# Patient Record
Sex: Female | Born: 1960
Health system: Southern US, Community
[De-identification: ages and names within clinical notes are randomized; demographics above are authoritative.]

## PROBLEM LIST (undated history)

## (undated) DIAGNOSIS — M5020 Other cervical disc displacement, unspecified cervical region: Secondary | ICD-10-CM

## (undated) DIAGNOSIS — M1990 Unspecified osteoarthritis, unspecified site: Secondary | ICD-10-CM

## (undated) DIAGNOSIS — I10 Essential (primary) hypertension: Secondary | ICD-10-CM

## (undated) DIAGNOSIS — F32A Depression, unspecified: Secondary | ICD-10-CM

## (undated) DIAGNOSIS — J45909 Unspecified asthma, uncomplicated: Secondary | ICD-10-CM

## (undated) DIAGNOSIS — F329 Major depressive disorder, single episode, unspecified: Secondary | ICD-10-CM

## (undated) DIAGNOSIS — G35D Multiple sclerosis, unspecified: Secondary | ICD-10-CM

## (undated) DIAGNOSIS — G43909 Migraine, unspecified, not intractable, without status migrainosus: Secondary | ICD-10-CM

## (undated) DIAGNOSIS — H9190 Unspecified hearing loss, unspecified ear: Secondary | ICD-10-CM

## (undated) DIAGNOSIS — C801 Malignant (primary) neoplasm, unspecified: Secondary | ICD-10-CM

## (undated) DIAGNOSIS — O039 Complete or unspecified spontaneous abortion without complication: Secondary | ICD-10-CM

## (undated) DIAGNOSIS — G35 Multiple sclerosis: Secondary | ICD-10-CM

## (undated) DIAGNOSIS — J342 Deviated nasal septum: Secondary | ICD-10-CM

## (undated) HISTORY — PX: OTHER SURGICAL HISTORY: SHX169

## (undated) HISTORY — DX: Other cervical disc displacement, unspecified cervical region: M50.20

## (undated) HISTORY — DX: Depression, unspecified: F32.A

## (undated) HISTORY — DX: Major depressive disorder, single episode, unspecified: F32.9

## (undated) HISTORY — DX: Multiple sclerosis: G35

## (undated) HISTORY — PX: NASAL SEPTUM SURGERY: SHX37

## (undated) HISTORY — DX: Complete or unspecified spontaneous abortion without complication: O03.9

## (undated) HISTORY — DX: Unspecified hearing loss, unspecified ear: H91.90

## (undated) HISTORY — DX: Essential (primary) hypertension: I10

## (undated) HISTORY — DX: Migraine, unspecified, not intractable, without status migrainosus: G43.909

## (undated) HISTORY — DX: Multiple sclerosis, unspecified: G35.D

## (undated) HISTORY — DX: Deviated nasal septum: J34.2

---

## 2014-06-11 DIAGNOSIS — M5020 Other cervical disc displacement, unspecified cervical region: Secondary | ICD-10-CM | POA: Insufficient documentation

## 2014-06-11 DIAGNOSIS — H9193 Unspecified hearing loss, bilateral: Secondary | ICD-10-CM | POA: Insufficient documentation

## 2014-06-11 DIAGNOSIS — H9190 Unspecified hearing loss, unspecified ear: Secondary | ICD-10-CM

## 2014-06-11 DIAGNOSIS — N951 Menopausal and female climacteric states: Secondary | ICD-10-CM | POA: Insufficient documentation

## 2014-06-11 DIAGNOSIS — F339 Major depressive disorder, recurrent, unspecified: Secondary | ICD-10-CM | POA: Insufficient documentation

## 2014-06-11 DIAGNOSIS — M5126 Other intervertebral disc displacement, lumbar region: Secondary | ICD-10-CM | POA: Insufficient documentation

## 2014-06-11 DIAGNOSIS — F17210 Nicotine dependence, cigarettes, uncomplicated: Secondary | ICD-10-CM | POA: Insufficient documentation

## 2014-06-11 HISTORY — DX: Unspecified hearing loss, unspecified ear: H91.90

## 2014-06-19 ENCOUNTER — Encounter: Payer: Self-pay | Admitting: *Deleted

## 2014-06-20 ENCOUNTER — Encounter: Payer: Self-pay | Admitting: Neurology

## 2014-06-20 ENCOUNTER — Ambulatory Visit (INDEPENDENT_AMBULATORY_CARE_PROVIDER_SITE_OTHER): Payer: Medicare HMO | Admitting: Neurology

## 2014-06-20 VITALS — BP 149/95 | HR 103 | Ht 62.0 in | Wt 131.0 lb

## 2014-06-20 DIAGNOSIS — R5381 Other malaise: Secondary | ICD-10-CM

## 2014-06-20 DIAGNOSIS — G35 Multiple sclerosis: Secondary | ICD-10-CM

## 2014-06-20 DIAGNOSIS — R200 Anesthesia of skin: Secondary | ICD-10-CM

## 2014-06-20 DIAGNOSIS — R5383 Other fatigue: Secondary | ICD-10-CM

## 2014-06-20 DIAGNOSIS — G43909 Migraine, unspecified, not intractable, without status migrainosus: Secondary | ICD-10-CM

## 2014-06-20 DIAGNOSIS — G2581 Restless legs syndrome: Secondary | ICD-10-CM

## 2014-06-20 DIAGNOSIS — R209 Unspecified disturbances of skin sensation: Secondary | ICD-10-CM

## 2014-06-20 DIAGNOSIS — R202 Paresthesia of skin: Secondary | ICD-10-CM

## 2014-06-20 NOTE — Patient Instructions (Signed)
Overall you are doing fairly well but I do want to suggest a few things today:   Remember to drink plenty of fluid, eat healthy meals and do not skip any meals. Try to eat protein with a every meal and eat a healthy snack such as fruit or nuts in between meals. Try to keep a regular sleep-wake schedule and try to exercise daily, particularly in the form of walking, 20-30 minutes a day, if you can.   As far as diagnostic testing: EMG/NCS for ulnar neuropathy, MRI of the brain and cervical spine. Bloodwork.  I would like to see you back in 3 months, sooner if we need to. Please call us with any interim questions, concerns, problems, updates or refill requests.   Please also call us for any test results so we can go over those with you on the phone.  My clinical assistant and will answer any of your questions and relay your messages to me and also relay most of my messages to you.   Our phone number is [phone]. We also have an after hours call service for urgent matters and there is a physician on-call for urgent questions. For any emergencies you know to call 911 or go to the nearest emergency room

## 2014-06-20 NOTE — Progress Notes (Addendum)
[facility]    Provider:  Dr Jaynee Eagles Referring Provider: Leamon Arnt, MD Primary Care Physician:  Leamon Arnt, MD  CC:  Multiple Sclerosis  HPI:  Debbie Watts is a 53 y.o. female here as a referral from Dr. Jonni Sanger for Multiple Sclerosis  53 year old female who was diagnosed with MS in 1999. She has no records. She had MRI and lumbar puncture. Was diagnosed with Dr. Nadara Mustard zwibel in [place] and is retired. Then saw Dr. Fatima Sanger and haven't seen him for at least 5 years in [place]. Has had issues the past 2.5 years but has not seen a neurologist. Was on Avonex but stopped due to insurance. Symptoms on the avonex didn't worsen or improve, remained stable.  Is not on any medications now except baclofen for spasticity. The last 3 years she has been ok. Every so often she gets "little glitches here and there". She is having some problems with coldness of the hands. Original symptoms were weakness of the right arm. Does have several herniated disks in her neck and back. No right arm weakness in the last 3 years. Her fingers feel tight and swollen occ just on the right hand. She is experiencing RLS. Her left leg starts bothering her and she has to keep moving it mostly at night but during the day too. Right leg too but not as much. Takes Sinemet for RLS. Trips over her own feet sometimes. She fell once 2.5 months ago and skinned her knee. 2 occassions of hot flashes after having a beer and she passed out but came right back to consciousness  in 2011. She went to the hospital but doesn't remember the diagnosis although she was evaluated for seizures and was negative. Swallowing ok. No UTIs and urinating well. No urinary frequency or retention. Neck is sore. The 2 fingers on the right are numb, digits 4-5. Thinks it is an ulnar neuropathy. If she sits for long periods of time her ankles will swell. Also has a primary care physician who she is following with. No vision loss. Reading is  harder but no problems. Elbow hurts, paresthesias when leaning on elbow.   Dr. Wandra Feinstein in [place]. Will order previous medical records.   Reviewed notes, labs and imaging from outside physicians, which showed: Has copd and current smoker, is on baclofen for spasticity, sinemet for RLS, flexeril for muscle spasms, paxil for depression, tramadol for pain, takes medication for migraine, has a cervical herniated disk  Review of Systems: Patient complains of symptoms per HPI as well as the following symptoms fatigue, easy bruising, hearing loss and ringing, joint pain, joint swelling, cramps, aching muscles, headache, numbness, weakness, passing out, restless legs, depression, decreased energy. Pertinent negatives per HPI. Otherwise out of a complete 14 system review, and all other reviewed systems are negative.   History   Social History  . Marital Status: Single    Spouse Name: N/A    Number of Children: 2  . Years of Education: 12   Occupational History  . Not on file.   Social History Main Topics  . Smoking status: Current Every Day Smoker  . Smokeless tobacco: Never Used  . Alcohol Use: Yes     Comment: 2-3 beers daily  . Drug Use: No  . Sexual Activity: Not on file   Other Topics Concern  . Not on file   Social History Narrative   Patient is single with 2 children.   Patient is right handed.  Patient has high school education.   Patient drinks 2 cups daily.    Family History  Problem Relation Age of Onset  . Adopted: Yes  . Family history unknown: Yes    Past Medical History  Diagnosis Date  . Deviated nasal septum   . Herniated cervical disc   . MS (multiple sclerosis)   . Depression   . Migraine   . Hearing loss 06/11/2014    Bilateral  . Miscarriage     2    Past Surgical History  Procedure Laterality Date  . Nasal septum surgery    . Spinal injections      Current Outpatient Prescriptions  Medication Sig Dispense Refill  .  Acetaminophen-Caffeine (TENSION HEADACHE RELIEF PO) Take by mouth.      . baclofen (LIORESAL) 10 MG tablet Take 10 mg by mouth daily.      . carbidopa-levodopa (SINEMET IR) 10-100 MG per tablet Take 1 tablet by mouth at bedtime.      . cyclobenzaprine (FLEXERIL) 10 MG tablet Take 10 mg by mouth at bedtime.      Marland Kitchen eletriptan (RELPAX) 40 MG tablet Take 40 mg by mouth as needed for migraine or headache. One tablet by mouth at onset of headache. May repeat in 2 hours if headache persists or recurs.      Marland Kitchen PARoxetine (PAXIL) 10 MG tablet Take 10 mg by mouth daily.      . traMADol (ULTRAM) 50 MG tablet Take 50 mg by mouth every 6 (six) hours as needed.       No current facility-administered medications for this visit.    Allergies as of 06/20/2014 - Review Complete 06/20/2014  Allergen Reaction Noted  . Novocain [procaine]  06/19/2014    Vitals: BP 149/95  Pulse 103  Ht 5\' 2"  (1.575 m)  Wt 131 lb (59.421 kg)  BMI 23.95 kg/m2 Last Weight:  Wt Readings from Last 1 Encounters:  06/20/14 131 lb (59.421 kg)   Last Height:   Ht Readings from Last 1 Encounters:  06/20/14 5\' 2"  (1.575 m)     Physical exam: Exam: Gen: NAD, conversant Eyes: anicteric sclerae, moist conjunctivae HENT: Atraumatic, oropharynx clear Neck: Trachea midline; supple,  Lungs: CTA, decreased breath sounds at bases                       CV: RRR, no MRG Abdomen: Soft, non-tender;  Extremities: No peripheral edema  Skin: Normal temperature, no rash,  Psych: Appropriate affect, pleasant  Neuro: Detailed Neurologic Exam  Speech:    Speech is normal; fluent and spontaneous with normal comprehension.  Cognition:    The patient is oriented to person, place, and time; memory intact; language fluent; normal attention, concentration, and fund of knowledge.  Cranial Nerves:    The pupils are equal, round, and reactive to light. Mild fundi pallor bilaterally, Visual fields are full to finger confrontation. Extraocular  movements are intact. Trigeminal sensation is intact and the muscles of mastication are normal. The face is symmetric. The palate elevates in the midline. Voice is normal. Shoulder shrug is normal. The tongue has normal motion without fasciculations.   Mild pallor fundi? Brisk upper and patellar relfexes   Coordination:    Normal finger to nose and heel to shin.  Gait:    Heel-toe and tandem gait are normal.   Motor Observation:    No asymmetry, no atrophy, and no involuntary movements noted.  Tone:    Normal muscle  tone.  Posture:    Posture is normal. normal erect    Strength:    Right hip flexion 3/5 and mild right grip weakness otherwise strength is V/V in the upper and lower limbs.   Vibratory Sensation:    Impaired right lower 5 seconds at the great toe, normal left great toe   Light Touch:    Normal light touch sensation in upper and lower extremities.  Proprioception:    Impaired right lower great toe  Pin Prick:    Normal sensation to pinprick in upper and lower extremities. Temperature:    Normal temperature sensation in upper and lower extremities. Reflex Exam:  DTR's:    Brisk DTRs throughout   Toes:    The toes are upgoing bilaterally.    Clonus:    Clonus is absent.  Assessment: 53 year old female who was diagnosed in 1999 with MS. She was on Avonex in the past but lost her insurance. Has not seen a neurologist in at least 5 years and has no previous records. Neuro exam demonstrates brisk reflexes, upgoing toes, normal tone, mildy pale fundi, mild right hip flexion weakness and right hand paresthesias and mild right hand grip.  Will request previous medical records Will image brain and neck with MS protocol EMG/NCS to eval for paresthesia in the hands, Will do a bloodwork screen for RLS including ferritin Continue triptan prn for migraines Continue sinemet for RLS counsed to stop smoking   Addendum 07/01/2014: Received approx 80 pages of documents  from patient from Dr. Verlin Fester, MD(primary care)  ranging from 2007 to 2015. Multiple sclerosis was mentioned in her notes several times but was not consistently listed in her PMHx and there was no supporting documentation on the diagnosis or any symptoms related to MS in the notes; no Brain, cervical or thoracic imaging included or referred to in the notes. No mention of patient seeing a neurologist for MS.    Diagnoses mentioned in the notes include: Arthritis, Fibromyalgia, COPD, bursitis, migraine headaches, neuropathy, multiple sclerosis, irregular menstrual bleeding, chronic sinusitis, allergic rhinitis, otitis externa, fibrocystic breast disease, uti, uri, cervical DJD, backache, ankle fx closed 3rd toe, lumbago,bursitis, tendonitis,costochondritis, fracture 4 ribs, gastroenteritis  Medications taken at some point over these years include: flexeril, tramadol, iron 325mg , sinemet, baclofen, paroxetine, flonase, astepro, topamax 25mg , lisinopril, cymbalta, fioricet, tomazepam, percocet,maxalt, zithromax, allegra, naproxen, vicodin, soma, imitrex, darvocet, napsylate, levoquin, diazepam, biaxin,    Sarina Ill, MD  [facility] [address] [place],  [address]  Phone [phone] Fax [phone]

## 2014-06-21 LAB — CBC WITH DIFFERENTIAL
Basophils Absolute: 0.1 10*3/uL (ref 0.0–0.2)
Basos: 1 %
EOS: 1 %
Eosinophils Absolute: 0.1 10*3/uL (ref 0.0–0.4)
HCT: 44.5 % (ref 34.0–46.6)
Hemoglobin: 14.9 g/dL (ref 11.1–15.9)
IMMATURE GRANS (ABS): 0 10*3/uL (ref 0.0–0.1)
Immature Granulocytes: 0 %
LYMPHS: 31 %
Lymphocytes Absolute: 3 10*3/uL (ref 0.7–3.1)
MCH: 29.6 pg (ref 26.6–33.0)
MCHC: 33.5 g/dL (ref 31.5–35.7)
MCV: 89 fL (ref 79–97)
MONOS ABS: 0.9 10*3/uL (ref 0.1–0.9)
Monocytes: 9 %
NEUTROS PCT: 58 %
Neutrophils Absolute: 5.6 10*3/uL (ref 1.4–7.0)
Platelets: 368 10*3/uL (ref 150–379)
RBC: 5.03 x10E6/uL (ref 3.77–5.28)
RDW: 14.4 % (ref 12.3–15.4)
WBC: 9.8 10*3/uL (ref 3.4–10.8)

## 2014-06-21 LAB — IRON AND TIBC
Iron Saturation: 16 % (ref 15–55)
Iron: 54 ug/dL (ref 35–155)
TIBC: 340 ug/dL (ref 250–450)
UIBC: 286 ug/dL (ref 150–375)

## 2014-06-21 LAB — COMPREHENSIVE METABOLIC PANEL
ALK PHOS: 94 IU/L (ref 39–117)
ALT: 16 IU/L (ref 0–32)
AST: 22 IU/L (ref 0–40)
Albumin/Globulin Ratio: 1.9 (ref 1.1–2.5)
Albumin: 4.5 g/dL (ref 3.5–5.5)
BUN / CREAT RATIO: 17 (ref 9–23)
BUN: 14 mg/dL (ref 6–24)
CALCIUM: 9.5 mg/dL (ref 8.7–10.2)
CO2: 27 mmol/L (ref 18–29)
CREATININE: 0.81 mg/dL (ref 0.57–1.00)
Chloride: 101 mmol/L (ref 97–108)
GFR calc Af Amer: 97 mL/min/{1.73_m2} (ref >59)
GFR calc non Af Amer: 84 mL/min/{1.73_m2} (ref >59)
GLOBULIN, TOTAL: 2.4 g/dL (ref 1.5–4.5)
Glucose: 74 mg/dL (ref 65–99)
POTASSIUM: 4.1 mmol/L (ref 3.5–5.2)
SODIUM: 144 mmol/L (ref 134–144)
Total Bilirubin: <0.2 mg/dL (ref 0.0–1.2)
Total Protein: 6.9 g/dL (ref 6.0–8.5)

## 2014-06-21 LAB — FERRITIN: Ferritin: 14 ng/mL — ABNORMAL LOW (ref 15–150)

## 2014-06-23 DIAGNOSIS — G2581 Restless legs syndrome: Secondary | ICD-10-CM | POA: Insufficient documentation

## 2014-06-23 DIAGNOSIS — G35 Multiple sclerosis: Secondary | ICD-10-CM | POA: Insufficient documentation

## 2014-06-23 DIAGNOSIS — R202 Paresthesia of skin: Secondary | ICD-10-CM | POA: Insufficient documentation

## 2014-06-23 DIAGNOSIS — G43909 Migraine, unspecified, not intractable, without status migrainosus: Secondary | ICD-10-CM | POA: Insufficient documentation

## 2014-06-23 HISTORY — DX: Restless legs syndrome: G25.81

## 2014-06-26 ENCOUNTER — Ambulatory Visit (INDEPENDENT_AMBULATORY_CARE_PROVIDER_SITE_OTHER): Payer: Medicare HMO | Admitting: Neurology

## 2014-06-26 ENCOUNTER — Encounter (INDEPENDENT_AMBULATORY_CARE_PROVIDER_SITE_OTHER): Payer: Medicare HMO

## 2014-06-26 DIAGNOSIS — R202 Paresthesia of skin: Secondary | ICD-10-CM

## 2014-06-26 DIAGNOSIS — R200 Anesthesia of skin: Secondary | ICD-10-CM

## 2014-06-26 DIAGNOSIS — Z0289 Encounter for other administrative examinations: Secondary | ICD-10-CM

## 2014-06-26 DIAGNOSIS — R209 Unspecified disturbances of skin sensation: Secondary | ICD-10-CM

## 2014-06-28 NOTE — Progress Notes (Signed)
  [facility]    Provider:  Dr Jaynee Eagles Referring Provider: Leamon Arnt, MD Primary Care Physician:  Leamon Arnt, MD  CC: Paresthesias History: 53 year old female who is having some problems with coldness of the hands. Her fingers feel tight and swollen on the right hand. The 2 fingers on the right are numb, digits 4-5. Thinks it is an ulnar neuropathy. Elbow hurts, paresthesias when leaning on right elbow. Focused upper extremity exam demonstrates mild right grip weakness but otherwise 5/5 strength, brisk DTRs.  Summary: Nerve Conduction studies were performed on the bilateral upper extremities.  Ulnar and Median motor conductions were within normal limits with normal F wave latencies.  Median, Ulnar sensory conductions were within normal limits.   EMG needle evaluation was performed on selected right upper extremity muscles: The right Deltoid, right Triceps, right Flexor Digitorum Profundus (ulnar), right Pronator Teres, right First Dorsal Interoseous, right Opponens Pollicis and right W5/I6 paraspinal muscles were normal.  Conclusion: This is a normal study. No electrophysiologic evidence for median or ulnar neuropathy or cervical radiculopathy.    Sarina Ill, MD  [facility]  [address] [place]  [place], [place] [address]  Phone [phone] Fax [phone]

## 2014-07-02 ENCOUNTER — Ambulatory Visit
Admission: RE | Admit: 2014-07-02 | Discharge: 2014-07-02 | Disposition: A | Discharge status: Home / Self Care | Payer: Commercial Managed Care - HMO | Source: Ambulatory Visit | Attending: Neurology | Admitting: Neurology

## 2014-07-02 DIAGNOSIS — G35 Multiple sclerosis: Secondary | ICD-10-CM

## 2014-07-02 MED ORDER — GADOBENATE DIMEGLUMINE 529 MG/ML IV SOLN
12.0000 mL | Freq: Once | INTRAVENOUS | Status: AC | PRN
  Administered 2014-07-02: 12 mL via INTRAVENOUS

## 2014-07-08 ENCOUNTER — Telehealth: Payer: Self-pay | Admitting: Neurology

## 2014-07-08 NOTE — Telephone Encounter (Signed)
Patient wanting MRI results. Please advise.

## 2014-07-08 NOTE — Telephone Encounter (Signed)
Patient calling to request MRI results, please call and advise.

## 2014-07-08 NOTE — Telephone Encounter (Signed)
Called and discussed results. Will start Tecfidera. Discussed common side effects. Also having difficulty with RLS. Will try rivastigmine patch.

## 2014-07-09 ENCOUNTER — Telehealth: Payer: Self-pay | Admitting: Neurology

## 2014-07-09 ENCOUNTER — Other Ambulatory Visit: Payer: Self-pay | Admitting: Neurology

## 2014-07-09 MED ORDER — DIMETHYL FUMARATE 120 MG PO CPDR: 120.0000 mg | DELAYED_RELEASE_CAPSULE | Freq: Two times a day (BID) | ORAL | Status: DC

## 2014-07-09 MED ORDER — ROTIGOTINE 2 MG/24HR TD PT24: 1.0000 | MEDICATED_PATCH | Freq: Every day | TRANSDERMAL | Status: DC

## 2014-07-09 MED ORDER — ROTIGOTINE 1 MG/24HR TD PT24: 1.0000 mg | MEDICATED_PATCH | Freq: Every day | TRANSDERMAL | Status: DC

## 2014-07-09 NOTE — Telephone Encounter (Signed)
Boyfriend calling back about medications to expensive and would prefer to check with insurance prior to even starting this medication.   I relayed that enrollment form for tecfidera will be faxed to pharm tech, Janett Billow and then from there will decide about taking.  Baclofen and carbidopa-levadopa are affordable for pt who is on fixed income (disability).  Neupro 275$ for 90 days and over 2,000$ for tecfidera after calling insurance ([employer]).

## 2014-07-09 NOTE — Telephone Encounter (Signed)
Debbie Watts - Is there any assistance programs that can help with her medication costs? Baclofen and Sinemet only help with the symptoms and do not stop the progression of the disease. Would yo mind calling back the patient and explaining that to her? Would you be able to find out about anything that can help with the medication cost for me please? Let me know, thanks you.

## 2014-07-09 NOTE — Telephone Encounter (Signed)
Patient's boyfriend Merry Proud calling to state that patient has questions regarding the samples of medication she was given since she is very confused, please return call and advise.

## 2014-07-09 NOTE — Telephone Encounter (Signed)
Pt here for samples of tecfidera and also neupro patches.  Pt signed tecfidera enrollment form, samples of tecfidera and neupro given as per Dr.Ahern instructions along with directions.  Pt to call back for questions.  She verbalized understanding.   She will try one medication and then start the other one after she notes if has SE from other.

## 2014-07-10 NOTE — Telephone Encounter (Signed)
Once [employer] nutritionist) finishes their benefit investigation, they will contact the patient and go over co-pay info as well as info regarding different programs they have available to offer financial assistance if the patient qualifies.  Their contact number is [phone].  Regarding Neupro, the patient can call [employer] at [phone] to see if they meet the criteria for assistance.   I called the patient back.  Relayed this info.  They were very appreciative and will follow up with [employer] and [employer] to see if they can offer assistance. Patient indicates they would like to hold off on starting these drugs until they know if they will be able to afford them or not.  They will call us back if anything further is needed.

## 2014-07-12 ENCOUNTER — Telehealth: Payer: Self-pay

## 2014-07-12 NOTE — Telephone Encounter (Signed)
[employer] notified us they have approved our request for coverage on Tecfidera effective until 07/11/2016 Ref Case # [id number]

## 2014-09-02 ENCOUNTER — Telehealth: Payer: Self-pay | Admitting: Neurology

## 2014-09-02 NOTE — Telephone Encounter (Signed)
Patient is requesting a Rx for Sinemet.  This is not currently on the med list.  Would you like to prescribe?  Please advise.  Thank you.    Per previous note on 09/15, I contacted patient and provided them with info to contact assistance program for Neupro.  I called the patient back to see if they contacted the program, and if so, were they eligible, however, I got no answer.

## 2014-09-02 NOTE — Telephone Encounter (Signed)
That is fine, she takes Sinemet for restless leg syndrome. This was previously prescribed by another physician. If it works I can continue it. Would you call her back and get her dose and then place the prescription please? Would you inquire on her MS medication, if she received it and the price issue was worked out? Thank you.

## 2014-09-02 NOTE — Telephone Encounter (Signed)
I called the patient back.  Got no answer.  Left message asking that she call us back to verify dose, and as well asked that she let us know if she has received MS med and if price was feasible.

## 2014-09-02 NOTE — Telephone Encounter (Signed)
Patient needs a new Rx called in for Carbidopa-Lovodopa. Patient takes 1 tablet at bedtime. Rx was previously filled by patient's PCP doctor in [place]. Please call to [place]. Patient would like 10 pills called to [place] until the mail order Rx is filled. Thank you.

## 2014-09-03 MED ORDER — CARBIDOPA-LEVODOPA 10-100 MG PO TABS: 1.0000 | ORAL_TABLET | Freq: Every day | ORAL | Status: DC

## 2014-09-03 NOTE — Telephone Encounter (Signed)
I called back.  Patient says she takes Sinemet 10-100mg  one at bedtime.  Indicates she is taking MS meds, and is currently working with their co-pay assist program.  She will call us back if anything further is needed.   She [facility], and [facility] day [facility] be [facility] to [facility].  Rx's have been sent.   carbidopa-levodopa (SINEMET IR) 10-100 MG per tablet 14 tablet 1 09/03/2014      Sig - Route: Take 1 tablet by mouth at bedtime. - Oral    Notes to Pharmacy: Patient is waiting on mail order. Thank you.    E-Prescribing Status: Receipt confirmed by pharmacy (09/03/2014 4:19 PM EST)     [id number]     carbidopa-levodopa (SINEMET IR) 10-100 MG per tablet 90 tablet 1 09/03/2014      Sig - Route: Take 1 tablet by mouth at bedtime. - Oral    E-Prescribing Status: Receipt confirmed by pharmacy (09/03/2014 4:21 PM EST)     Pharmacy    [place]

## 2014-09-03 NOTE — Telephone Encounter (Signed)
Patient returning call to Janett Billow, please call her back at [phone].

## 2014-09-10 ENCOUNTER — Telehealth: Payer: Self-pay

## 2014-09-10 NOTE — Telephone Encounter (Signed)
Chanayle with [employer] ([employer]) contacted me and said they have called this patient numerous times to try and complete her enrollment.  She states they called the patient, she answered the phone and told them she did not have time to talk and would call them back.  Since that time, they have called back and left 6 separate messages on patient's voicemail, and as well mailed her a letter advising that she needs to contact them.  Chanayle states they have everything ready to go, and the only thing they are waiting on is speaking with the patient.  Until they speak with her, the enrollment/assistance is on hold.  I called the patient, got no answer.  Left message relaying this info and asked that she please call [employer] back at [phone] at her earliest convenience.

## 2014-09-11 NOTE — Telephone Encounter (Signed)
Chanayle just contacted me and said the patient has finally called them back to complete enrollment.

## 2014-09-11 NOTE — Telephone Encounter (Signed)
Would you please send her a letter to this effect? That's all we can do. Thank you

## 2014-09-18 NOTE — Telephone Encounter (Addendum)
Chanayle called back and said she was mistaken, this patient actually did not call them back.  They have to close her case since she has not returned their call.  A letter was mailed to this effect.  I called patient again, got no answer.  Left message.

## 2014-10-03 ENCOUNTER — Telehealth: Payer: Self-pay | Admitting: Neurology

## 2014-10-03 ENCOUNTER — Telehealth: Payer: Self-pay

## 2014-10-03 MED ORDER — ROTIGOTINE 2 MG/24HR TD PT24: 1.0000 | MEDICATED_PATCH | Freq: Every day | TRANSDERMAL | Status: DC

## 2014-10-03 NOTE — Telephone Encounter (Signed)
Pt is calling regarding rotigotine (NEUPRO) 2 MG/24HR.  Phone call was disconnected.

## 2014-10-03 NOTE — Telephone Encounter (Signed)
Pt is calling regarding rotigotine (NEUPRO) 2 MG/24HR. She needs to know if she can get a Rx.  She only had samples.  She uses mail order [facility]. Please call and advise.

## 2014-10-03 NOTE — Telephone Encounter (Signed)
Rx has been sent.  I called back. Got no answer.  Left message.

## 2014-10-08 NOTE — Telephone Encounter (Signed)
I called back.  Spoke with patient.  She was requesting number for Neupro PAP.  I relayed the info of [phone].  She will contact them and call us back if anything further is needed.

## 2014-10-08 NOTE — Telephone Encounter (Signed)
Pt is calling back wanting to know if we have any samples of rotigotine (NEUPRO) 2 MG/24HR she can have until she gets her medicine.  Please call and advise.

## 2015-04-16 ENCOUNTER — Telehealth: Payer: Self-pay | Admitting: Neurology

## 2015-04-16 MED ORDER — DIMETHYL FUMARATE 240 MG PO CPDR: 240.0000 mg | DELAYED_RELEASE_CAPSULE | Freq: Two times a day (BID) | ORAL | Status: DC

## 2015-04-16 NOTE — Telephone Encounter (Signed)
Patient called requesting Tecfidera to be transferred to [facility]. Fax # [phone]. Needs a cover sheet with patient name, address, phone number,DOB and  [employer] ID number with the RX. Please call and advise. Patient can be reached at [phone]

## 2015-04-16 NOTE — Telephone Encounter (Signed)
Info has been updated and sent.  I called back to advise.  Got no answer.  Left message.

## 2015-05-23 ENCOUNTER — Telehealth: Payer: Self-pay | Admitting: Neurology

## 2015-05-23 MED ORDER — DIMETHYL FUMARATE 240 MG PO CPDR: 240.0000 mg | DELAYED_RELEASE_CAPSULE | Freq: Two times a day (BID) | ORAL | Status: DC

## 2015-05-23 MED ORDER — CARBIDOPA-LEVODOPA 10-100 MG PO TABS: 1.0000 | ORAL_TABLET | Freq: Every day | ORAL | Status: DC

## 2015-05-23 NOTE — Telephone Encounter (Signed)
I called back.  Got no answer.  Left message.  

## 2015-05-23 NOTE — Telephone Encounter (Signed)
Patient is calling to get a refill on carbidopa-levodopa (SINEMET IR) 10-100 MG per tablet called to [place]. Also the patient needs a refill on Dimethyl Fumarate (TECFIDERA) 240 MG CPDR called to [place]. Thank you.

## 2015-05-27 ENCOUNTER — Telehealth: Payer: Self-pay | Admitting: Neurology

## 2015-05-27 MED ORDER — CARBIDOPA-LEVODOPA 10-100 MG PO TABS: 1.0000 | ORAL_TABLET | Freq: Every day | ORAL | Status: DC

## 2015-05-27 NOTE — Telephone Encounter (Signed)
I called back and spoke with patient.  [facility]

## 2015-05-27 NOTE — Telephone Encounter (Signed)
Pt called and needs refill on carbidopa-levodopa (SINEMET IR) 10-100 MG per tablet. States that the manufacturer is out of this medication. She suggest that she can take a different one. May call pt at [phone]

## 2015-05-27 NOTE — Telephone Encounter (Signed)
I called the pharmacy to clarify.  Spoke with Erline Levine.  She said they are unable to get this medication at the present time.  They will not have it in stock again until late October.  [facility]  Spoke with Thayer Headings.  She checked the shelf and said they do have this drug in stock, and could fill the Rx for this patient.  I called the patient back.  Got no answer.  Left message.

## 2015-05-27 NOTE — Telephone Encounter (Signed)
Patient is returning your call about refill

## 2015-06-12 ENCOUNTER — Ambulatory Visit: Payer: Medicare HMO | Admitting: Neurology

## 2015-07-02 ENCOUNTER — Ambulatory Visit (INDEPENDENT_AMBULATORY_CARE_PROVIDER_SITE_OTHER): Payer: Medicare HMO | Admitting: Neurology

## 2015-07-02 ENCOUNTER — Encounter: Payer: Self-pay | Admitting: Neurology

## 2015-07-02 VITALS — BP 119/78 | HR 108 | Ht 62.0 in | Wt 128.8 lb

## 2015-07-02 DIAGNOSIS — R5382 Chronic fatigue, unspecified: Secondary | ICD-10-CM

## 2015-07-02 DIAGNOSIS — R58 Hemorrhage, not elsewhere classified: Secondary | ICD-10-CM

## 2015-07-02 DIAGNOSIS — G35 Multiple sclerosis: Secondary | ICD-10-CM | POA: Diagnosis not present

## 2015-07-02 MED ORDER — CARBIDOPA-LEVODOPA 10-100 MG PO TABS: 1.0000 | ORAL_TABLET | Freq: Every day | ORAL | Status: DC

## 2015-07-02 MED ORDER — GABAPENTIN 300 MG PO CAPS: 600.0000 mg | ORAL_CAPSULE | Freq: Every day | ORAL | Status: DC

## 2015-07-02 NOTE — Patient Instructions (Signed)
Remember to drink plenty of fluid, eat healthy meals and do not skip any meals. Try to eat protein with a every meal and eat a healthy snack such as fruit or nuts in between meals. Try to keep a regular sleep-wake schedule and try to exercise daily, particularly in the form of walking, 20-30 minutes a day, if you can.   As far as your medications are concerned, I would like to suggest: continue current medications  As far as diagnostic testing: labs  I would like to see you back in 1 year, sooner if we need to. Please call us with any interim questions, concerns, problems, updates or refill requests.   Please also call us for any test results so we can go over those with you on the phone.  My clinical assistant and will answer any of your questions and relay your messages to me and also relay most of my messages to you.   Our phone number is [phone]. We also have an after hours call service for urgent matters and there is a physician on-call for urgent questions. For any emergencies you know to call 911 or go to the nearest emergency room

## 2015-07-02 NOTE — Progress Notes (Signed)
[facility]    [facility]    Provider: Dr Jaynee Eagles Referring Provider: Leamon Arnt, MD Primary Care Physician: Leamon Arnt, MD  CC: Multiple Sclerosis  Interval update 07/02/2015. Here for follow up on MS. She has been fine. They now have a dog.  She is doing well on the medication. No exacerbations. She sometimes have cramps in her feet and calfs. At least 3-4 times a week. Only when she sleeps. She is fatigued. She has cramps at night. She tales the medication at night with food to avoid nausea. She tries not to carry in too many groceries at a time, it is too fatiguing. Denies spasticity. She stumbles over her feet but no falls. No swallowing difficulty. No urinary problems. No vision loss.   Imaging in September 2015:  MR C Spine:  Abnormal MRI cervical spine (with and without) demonstrating: 1. Chronic demyelinating plaques C5 and C6-7 on the right side. 2. No acute plaques. 3. Disc bulging and spondylosis from C4-5 to C6-7. At C5-6 there is moderate right and mild left foraminal stenosis.  MR BRain:  IMPRESSION:  Abnormal MRI brain (with and without) demonstrating: 1. Multiple round and ovoid, periventricular and subcortical and juxtacortical chronic demyelinating plaques. 2. No acute plaques.   06/20/2014: Debbie Watts is a 54 y.o. female here as a referral from Dr. Jonni Sanger for Multiple Sclerosis  54 year old female who was diagnosed with MS in 1999. She has no records. She had MRI and lumbar puncture. Was diagnosed with Dr. Nadara Mustard zwibel in [place] and is retired. Then saw Dr. Fatima Sanger and haven't seen him for at least 5 years in [place]. Has had issues the past 2.5 years but has not seen a neurologist. Was on Avonex but stopped due to insurance. Symptoms on the avonex didn't worsen or improve, remained stable. Is not on any medications now except baclofen for spasticity. The last 3 years she has been ok. Every so often she gets  "little glitches here and there". She is having some problems with coldness of the hands. Original symptoms were weakness of the right arm. Does have several herniated disks in her neck and back. No right arm weakness in the last 3 years. Her fingers feel tight and swollen occ just on the right hand. She is experiencing RLS. Her left leg starts bothering her and she has to keep moving it mostly at night but during the day too. Right leg too but not as much. Takes Sinemet for RLS. Trips over her own feet sometimes. She fell once 2.5 months ago and skinned her knee. 2 occassions of hot flashes after having a beer and she passed out but came right back to consciousness in 2011. She went to the hospital but doesn't remember the diagnosis although she was evaluated for seizures and was negative. Swallowing ok. No UTIs and urinating well. No urinary frequency or retention. Neck is sore. The 2 fingers on the right are numb, digits 4-5. Thinks it is an ulnar neuropathy. If she sits for long periods of time her ankles will swell. Also has a primary care physician who she is following with. No vision loss. Reading is harder but no problems. Elbow hurts, paresthesias when leaning on elbow.   Dr. Wandra Feinstein in [place]. Will order previous medical records.   Reviewed notes, labs and imaging from outside physicians, which showed: Has copd and current smoker, is on baclofen for spasticity, sinemet for RLS, flexeril for muscle spasms, paxil  for depression, tramadol for pain, takes medication for migraine, has a cervical herniated disk  Review of Systems: Patient complains of symptoms per HPI as well as the following symptoms fatigue, easy bruising, hearing loss and ringing, joint pain, joint swelling, cramps, aching muscles, headache, numbness, weakness, passing out, restless legs, depression, decreased energy. Pertinent negatives per HPI. Otherwise out of a complete 14 system review, and all other  reviewed systems are negative.   Social History   Social History  . Marital Status: Single    Spouse Name: N/A  . Number of Children: 2  . Years of Education: 12   Occupational History  . Not on file.   Social History Main Topics  . Smoking status: Current Every Day Smoker  . Smokeless tobacco: Never Used  . Alcohol Use: Yes     Comment: 2-3 beers daily  . Drug Use: No  . Sexual Activity: Not on file   Other Topics Concern  . Not on file   Social History Narrative   Patient is single with 2 children.   Patient is right handed.   Patient has high school education.   Patient drinks 2 cups daily.    Family History  Problem Relation Age of Onset  . Adopted: Yes  . Family history unknown: Yes    Past Medical History  Diagnosis Date  . Deviated nasal septum   . Herniated cervical disc   . MS (multiple sclerosis)   . Depression   . Migraine   . Hearing loss 06/11/2014    Bilateral  . Miscarriage     2    Past Surgical History  Procedure Laterality Date  . Nasal septum surgery    . Spinal injections      Current Outpatient Prescriptions  Medication Sig Dispense Refill  . Acetaminophen-Caffeine (TENSION HEADACHE RELIEF PO) Take by mouth.    . baclofen (LIORESAL) 10 MG tablet Take 10 mg by mouth daily.    . carbidopa-levodopa (SINEMET IR) 10-100 MG per tablet Take 1 tablet by mouth at bedtime. 30 tablet 0  . cyclobenzaprine (FLEXERIL) 10 MG tablet Take 10 mg by mouth at bedtime.    . Dimethyl Fumarate (TECFIDERA) 240 MG CPDR Take 1 capsule (240 mg total) by mouth 2 (two) times daily. (Patient ID [id number]) 60 capsule 0  . Dimethyl Fumarate 120 MG CPDR Take 120 mg by mouth 2 (two) times daily. Start one tab twice daily for 7 days then 2 tabs twice daily. Can take with food. 14 capsule 6  . eletriptan (RELPAX) 40 MG tablet Take 40 mg by mouth as needed for migraine or headache. One tablet by mouth at onset of headache. May repeat in 2 hours if headache persists or  recurs.    . gabapentin (NEURONTIN) 300 MG capsule Take 300 mg by mouth daily.    Marland Kitchen lisinopril (PRINIVIL,ZESTRIL) 5 MG tablet Take 5 mg by mouth daily.    . traMADol (ULTRAM) 50 MG tablet Take 50 mg by mouth every 6 (six) hours as needed.    . ferrous sulfate 325 (65 FE) MG tablet Take 325 mg by mouth daily.    Marland Kitchen PARoxetine (PAXIL) 20 MG tablet Take 20 mg by mouth daily.     No current facility-administered medications for this visit.    Allergies as of 07/02/2015 - Review Complete 07/02/2015  Allergen Reaction Noted  . Novocain [procaine]  06/19/2014    Vitals: BP 119/78 mmHg  Pulse 108  Ht 5\' 2"  (1.575 m)  Wt 128 lb 12.8 oz (58.423 kg)  BMI 23.55 kg/m2 Last Weight:  Wt Readings from Last 1 Encounters:  07/02/15 128 lb 12.8 oz (58.423 kg)   Last Height:   Ht Readings from Last 1 Encounters:  07/02/15 5\' 2"  (1.575 m)    Physical exam: Exam: Gen: NAD, conversant, well nourised, well groomed                     CV: RRR, no MRG. No Carotid Bruits. No peripheral edema, warm, nontender Eyes: Conjunctivae clear without exudates or hemorrhage  Neuro: Detailed Neurologic Exam  Speech:    Speech is normal; fluent and spontaneous with normal comprehension.  Cognition:    The patient is oriented to person, place, and time;     recent and remote memory intact;     language fluent;     normal attention, concentration,     fund of knowledge Cranial Nerves:    The pupils are equal, round, and reactive to light.  Visual fields are full to finger confrontation. Extraocular movements are intact. Trigeminal sensation is intact and the muscles of mastication are normal. The face is symmetric. The palate elevates in the midline. Hearing intact. Voice is normal. Shoulder shrug is normal. The tongue has normal motion without fasciculations.   Coordination:    Normal finger to nose and heel to shin. Normal rapid alternating movements.   Gait:    Heel-toe and tandem gait are normal.    Motor Observation:    No asymmetry, no atrophy, and no involuntary movements noted. Tone:    Normal muscle tone.    Posture:    Posture is normal. normal erect    Strength: right IP weakness 3+/5. Otherwise strength is V/V in the upper and lower limbs.      Sensation: intact to LT     Reflex Exam:  DTR's:    Deep tendon reflexes in the upper and lower extremities are normal bilaterally.   Toes:    The toes are downgoing bilaterally.   Clonus:    Clonus is absent.    Assessment: 54 year old female who was diagnosed in 1999 with MS. She was on Avonex in the past but lost her insurance. Neuro exam demonstrates brisk reflexes, upgoing toes, normal tone, mildy pale fundi, mild right hip flexion weakness and right hand paresthesias and mild right hand grip.  Continue triptan prn for migraines Continue sinemet for RLS counsed to stop smoking Labs today Vitamin D 1000 bid Declined repeat MRi of the brain and c-spine for surveillance.     Addendum 07/01/2014: Received approx 80 pages of documents from patient from Dr. Verlin Fester, MD(primary care) ranging from 2007 to 2015. Multiple sclerosis was mentioned in her notes several times but was not consistently listed in her PMHx and there was no supporting documentation on the diagnosis or any symptoms related to MS in the notes; no Brain, cervical or thoracic imaging included or referred to in the notes. No mention of patient seeing a neurologist for MS.   Diagnoses mentioned in the notes include: Arthritis, Fibromyalgia, COPD, bursitis, migraine headaches, neuropathy, multiple sclerosis, irregular menstrual bleeding, chronic sinusitis, allergic rhinitis, otitis externa, fibrocystic breast disease, uti, uri, cervical DJD, backache, ankle fx closed 3rd toe, lumbago,bursitis, tendonitis,costochondritis, fracture 4 ribs, gastroenteritis  Medications taken at some point over these years include: flexeril, tramadol, iron 325mg , sinemet,  baclofen, paroxetine, flonase, astepro, topamax 25mg , lisinopril, cymbalta, fioricet, tomazepam, percocet,maxalt, zithromax, allegra, naproxen, vicodin, soma, imitrex,  darvocet, napsylate, levoquin, diazepam, biaxin,    Sarina Ill, MD  [facility] [address] [place], [place] [address]  Phone [phone] Fax [phone]  A total of 25 minutes was spent face-to-face with this patient. Over half this time was spent on counseling patient on the MS diagnosis and different diagnostic and therapeutic options available.

## 2015-07-03 ENCOUNTER — Other Ambulatory Visit: Payer: Self-pay | Admitting: Neurology

## 2015-07-03 LAB — CBC
HEMATOCRIT: 43.3 % (ref 34.0–46.6)
HEMOGLOBIN: 14.5 g/dL (ref 11.1–15.9)
MCH: 31.6 pg (ref 26.6–33.0)
MCHC: 33.5 g/dL (ref 31.5–35.7)
MCV: 94 fL (ref 79–97)
Platelets: 318 10*3/uL (ref 150–379)
RBC: 4.59 x10E6/uL (ref 3.77–5.28)
RDW: 13.7 % (ref 12.3–15.4)
WBC: 6.8 10*3/uL (ref 3.4–10.8)

## 2015-07-03 LAB — COMPREHENSIVE METABOLIC PANEL
ALBUMIN: 4.6 g/dL (ref 3.5–5.5)
ALK PHOS: 73 IU/L (ref 39–117)
ALT: 16 IU/L (ref 0–32)
AST: 15 IU/L (ref 0–40)
Albumin/Globulin Ratio: 2.1 (ref 1.1–2.5)
BUN / CREAT RATIO: 14 (ref 9–23)
BUN: 11 mg/dL (ref 6–24)
Bilirubin Total: 0.3 mg/dL (ref 0.0–1.2)
CALCIUM: 9.5 mg/dL (ref 8.7–10.2)
CO2: 28 mmol/L (ref 18–29)
CREATININE: 0.81 mg/dL (ref 0.57–1.00)
Chloride: 101 mmol/L (ref 97–108)
GFR calc Af Amer: 96 mL/min/{1.73_m2} (ref >59)
GFR, EST NON AFRICAN AMERICAN: 83 mL/min/{1.73_m2} (ref >59)
GLOBULIN, TOTAL: 2.2 g/dL (ref 1.5–4.5)
GLUCOSE: 109 mg/dL — AB (ref 65–99)
Potassium: 5 mmol/L (ref 3.5–5.2)
SODIUM: 144 mmol/L (ref 134–144)
Total Protein: 6.8 g/dL (ref 6.0–8.5)

## 2015-07-03 LAB — B12 AND FOLATE PANEL
Folate: 13.5 ng/mL (ref >3.0)
VITAMIN B 12: 586 pg/mL (ref 211–946)

## 2015-07-03 LAB — VITAMIN D PNL(25-HYDRXY+1,25-DIHY)-BLD
VIT D 1 25 DIHYDROXY: 28.7 pg/mL (ref 19.9–79.3)
VIT D 25 HYDROXY: 29.5 ng/mL — AB (ref 30.0–100.0)

## 2015-07-03 LAB — MAGNESIUM: MAGNESIUM: 1.8 mg/dL (ref 1.6–2.3)

## 2015-07-08 ENCOUNTER — Telehealth: Payer: Self-pay | Admitting: *Deleted

## 2015-07-08 NOTE — Telephone Encounter (Signed)
I called and LMVM for pt on her Home # that vit D borderline low, recommended to take Vit D 2000units daily.  (evidence shows can prevent MS flares).  She is to call back if questions.

## 2015-07-08 NOTE — Telephone Encounter (Signed)
-----   Message from Melvenia Beam, MD sent at 07/07/2015  6:30 PM EDT ----- Let patient know her Vitamin D was borderline. Tell her to take 2000units a day. There is evidence that this can help prevent MS flares. thanks

## 2015-09-02 ENCOUNTER — Ambulatory Visit: Payer: Self-pay | Admitting: Neurology

## 2015-09-04 ENCOUNTER — Other Ambulatory Visit: Payer: Self-pay | Admitting: Neurology

## 2015-09-04 DIAGNOSIS — G35 Multiple sclerosis: Secondary | ICD-10-CM

## 2015-09-17 ENCOUNTER — Ambulatory Visit
Admission: RE | Admit: 2015-09-17 | Discharge: 2015-09-17 | Disposition: A | Discharge status: Home / Self Care | Payer: Commercial Managed Care - HMO | Source: Ambulatory Visit | Attending: Neurology | Admitting: Neurology

## 2015-09-17 ENCOUNTER — Inpatient Hospital Stay: Admission: RE | Admit: 2015-09-17 | Payer: Commercial Managed Care - HMO | Source: Ambulatory Visit

## 2015-09-17 DIAGNOSIS — G35 Multiple sclerosis: Secondary | ICD-10-CM

## 2015-09-17 MED ORDER — GADOBENATE DIMEGLUMINE 529 MG/ML IV SOLN
11.0000 mL | Freq: Once | INTRAVENOUS | Status: AC | PRN
  Administered 2015-09-17: 11 mL via INTRAVENOUS

## 2015-09-22 ENCOUNTER — Encounter: Payer: Self-pay | Admitting: Neurology

## 2015-09-22 ENCOUNTER — Ambulatory Visit (INDEPENDENT_AMBULATORY_CARE_PROVIDER_SITE_OTHER): Payer: Self-pay | Admitting: Neurology

## 2015-09-22 DIAGNOSIS — G35 Multiple sclerosis: Secondary | ICD-10-CM

## 2015-09-22 NOTE — Progress Notes (Signed)
Patient is here for study visit for [facility] MS study

## 2015-10-02 ENCOUNTER — Telehealth: Payer: Self-pay | Admitting: Neurology

## 2015-10-02 DIAGNOSIS — G35 Multiple sclerosis: Secondary | ICD-10-CM

## 2015-10-02 MED ORDER — DIMETHYL FUMARATE 240 MG PO CPDR: 240.0000 mg | DELAYED_RELEASE_CAPSULE | Freq: Two times a day (BID) | ORAL | Status: DC

## 2015-10-02 NOTE — Telephone Encounter (Signed)
Spoke to [place] from [place]. Advised I sent rx electronically. Did not receive fax. She verbalized understanding.

## 2015-10-02 NOTE — Telephone Encounter (Signed)
Debbie Watts with [facility] pharmacy requesting refill for tecfidera. She sts request was faxed on 09/27/15.  She can be reached at [phone].

## 2015-10-06 ENCOUNTER — Ambulatory Visit (INDEPENDENT_AMBULATORY_CARE_PROVIDER_SITE_OTHER): Payer: Self-pay | Admitting: Neurology

## 2015-10-06 ENCOUNTER — Encounter: Payer: Self-pay | Admitting: Neurology

## 2015-10-06 DIAGNOSIS — G35 Multiple sclerosis: Secondary | ICD-10-CM

## 2015-10-06 NOTE — Progress Notes (Signed)
Patient is here for a study visit for [facility] MS study

## 2015-10-23 ENCOUNTER — Encounter: Payer: Medicare HMO | Admitting: Neurology

## 2015-11-17 ENCOUNTER — Encounter: Payer: Medicare HMO | Admitting: Neurology

## 2015-12-15 ENCOUNTER — Encounter: Payer: Medicare HMO | Admitting: Neurology

## 2016-01-12 ENCOUNTER — Encounter: Payer: Medicare HMO | Admitting: Neurology

## 2016-01-26 DIAGNOSIS — I10 Essential (primary) hypertension: Secondary | ICD-10-CM | POA: Insufficient documentation

## 2016-02-09 ENCOUNTER — Encounter: Payer: Medicare HMO | Admitting: Neurology

## 2016-03-08 ENCOUNTER — Ambulatory Visit (INDEPENDENT_AMBULATORY_CARE_PROVIDER_SITE_OTHER): Payer: Self-pay | Admitting: Neurology

## 2016-03-08 DIAGNOSIS — Z0289 Encounter for other administrative examinations: Secondary | ICD-10-CM

## 2016-03-08 DIAGNOSIS — G35 Multiple sclerosis: Secondary | ICD-10-CM

## 2016-03-08 NOTE — Progress Notes (Signed)
Debbie Watts is here today for a study visit [phone]).    She reports no new MS related symptoms. However, she has had hip pain. On examination, she is tender over the trochanteric bursa. 1.   Exams and testing for her study visit were performed. 2.   Right trochanteric bursa injection with 40 mg Depo-Medrol in 3 mL Marcaine using sterile technique. She tolerated the procedure well. She is advised to take OTC medications and give [place] a call if the pain worsens or does not improve.

## 2016-05-10 ENCOUNTER — Telehealth: Payer: Self-pay | Admitting: Neurology

## 2016-05-10 NOTE — Telephone Encounter (Signed)
Patient is calling in regard to Tecfidera as she is trying to get approval from [facility].  Please call.

## 2016-05-10 NOTE — Telephone Encounter (Signed)
I have spoken with [place] this afternoon.  She requested a quote for cost of Tecfdera be faxed to  her pharmacy.  I have advised we don't have anything to fax to the pharmacy--she is not on Tecfidera, so benefits investigation/pt. assistance specific to her has not been done/is not available. She verbalized understanding of same/fim

## 2016-05-31 ENCOUNTER — Encounter (INDEPENDENT_AMBULATORY_CARE_PROVIDER_SITE_OTHER): Payer: Self-pay | Admitting: Neurology

## 2016-05-31 DIAGNOSIS — G35 Multiple sclerosis: Secondary | ICD-10-CM

## 2016-05-31 NOTE — Progress Notes (Signed)
Debbie Watts comes in today for a study visit. She reports no major problems. Study evaluations were performed. She reported some insomnia and OTC melatonin 5 mg was recommended.

## 2016-07-01 ENCOUNTER — Ambulatory Visit: Payer: Medicare HMO | Admitting: Neurology

## 2016-08-09 ENCOUNTER — Other Ambulatory Visit: Payer: Self-pay | Admitting: Neurology

## 2016-08-09 DIAGNOSIS — G35 Multiple sclerosis: Secondary | ICD-10-CM

## 2016-08-09 NOTE — Progress Notes (Signed)
Mri br

## 2016-08-23 ENCOUNTER — Encounter (INDEPENDENT_AMBULATORY_CARE_PROVIDER_SITE_OTHER): Payer: Self-pay | Admitting: Neurology

## 2016-08-23 ENCOUNTER — Ambulatory Visit
Admission: RE | Admit: 2016-08-23 | Discharge: 2016-08-23 | Disposition: A | Discharge status: Home / Self Care | Payer: No Typology Code available for payment source | Source: Ambulatory Visit | Attending: Neurology | Admitting: Neurology

## 2016-08-23 DIAGNOSIS — Z0289 Encounter for other administrative examinations: Secondary | ICD-10-CM

## 2016-08-23 DIAGNOSIS — G35 Multiple sclerosis: Secondary | ICD-10-CM

## 2016-09-05 ENCOUNTER — Other Ambulatory Visit: Payer: Self-pay | Admitting: Neurology

## 2016-11-15 ENCOUNTER — Ambulatory Visit (INDEPENDENT_AMBULATORY_CARE_PROVIDER_SITE_OTHER): Payer: Self-pay | Admitting: Neurology

## 2016-11-15 DIAGNOSIS — G35 Multiple sclerosis: Secondary | ICD-10-CM

## 2016-11-15 DIAGNOSIS — Z0289 Encounter for other administrative examinations: Secondary | ICD-10-CM

## 2016-11-17 NOTE — Progress Notes (Signed)
Debbie Watts comes in today for a study visit for the [id number] study

## 2016-11-30 DIAGNOSIS — J449 Chronic obstructive pulmonary disease, unspecified: Secondary | ICD-10-CM | POA: Insufficient documentation

## 2017-02-07 ENCOUNTER — Telehealth: Payer: Self-pay | Admitting: *Deleted

## 2017-02-07 ENCOUNTER — Ambulatory Visit: Payer: Self-pay | Admitting: Neurology

## 2017-02-07 NOTE — Telephone Encounter (Signed)
Left message letting patient know our office is without power.  Benjamine Mola, in research, will call her back to reschedule.

## 2017-02-21 ENCOUNTER — Ambulatory Visit (INDEPENDENT_AMBULATORY_CARE_PROVIDER_SITE_OTHER): Payer: Self-pay | Admitting: Neurology

## 2017-02-21 DIAGNOSIS — Z0289 Encounter for other administrative examinations: Secondary | ICD-10-CM

## 2017-02-23 ENCOUNTER — Ambulatory Visit: Payer: Self-pay | Admitting: Neurology

## 2017-02-28 ENCOUNTER — Ambulatory Visit (INDEPENDENT_AMBULATORY_CARE_PROVIDER_SITE_OTHER): Payer: Self-pay | Admitting: Neurology

## 2017-02-28 DIAGNOSIS — Z0289 Encounter for other administrative examinations: Secondary | ICD-10-CM

## 2017-03-01 ENCOUNTER — Encounter: Payer: Self-pay | Admitting: Neurology

## 2017-05-02 ENCOUNTER — Ambulatory Visit (INDEPENDENT_AMBULATORY_CARE_PROVIDER_SITE_OTHER): Payer: Self-pay | Admitting: Neurology

## 2017-05-02 ENCOUNTER — Encounter: Payer: Self-pay | Admitting: Neurology

## 2017-05-02 DIAGNOSIS — G35 Multiple sclerosis: Secondary | ICD-10-CM

## 2017-05-02 NOTE — Progress Notes (Signed)
She returns today for a study visit for the MS clinical trial.  The EDSS score is 3.0.   Subscales are 2-1-2-2-0-2-0-1    she has mild left leg spasticity, mild left hearing loss, mild left dorsiflexion weakness, decreased tandem walk mild gait ataxia, urinary hesitancy with some incontinence.

## 2017-07-12 ENCOUNTER — Other Ambulatory Visit: Payer: Self-pay | Admitting: Neurology

## 2017-07-12 DIAGNOSIS — G35 Multiple sclerosis: Secondary | ICD-10-CM

## 2017-07-26 ENCOUNTER — Ambulatory Visit (INDEPENDENT_AMBULATORY_CARE_PROVIDER_SITE_OTHER): Payer: Self-pay | Admitting: Neurology

## 2017-07-26 ENCOUNTER — Ambulatory Visit
Admission: RE | Admit: 2017-07-26 | Discharge: 2017-07-26 | Disposition: A | Discharge status: Home / Self Care | Payer: Self-pay | Source: Ambulatory Visit | Attending: Neurology | Admitting: Neurology

## 2017-07-26 DIAGNOSIS — G35 Multiple sclerosis: Secondary | ICD-10-CM

## 2017-07-26 MED ORDER — GADOBENATE DIMEGLUMINE 529 MG/ML IV SOLN
12.0000 mL | Freq: Once | INTRAVENOUS | Status: AC | PRN
  Administered 2017-07-26: 12 mL via INTRAVENOUS

## 2017-07-26 NOTE — Progress Notes (Signed)
Debbie Watts is here today for a study visit. She is near the end of the study. We discussed that she will need to change from the study medication to an approved medication for MS. She is interested in starting Tecfidera.

## 2017-08-05 ENCOUNTER — Other Ambulatory Visit: Payer: Self-pay | Admitting: Neurology

## 2017-08-05 DIAGNOSIS — G35 Multiple sclerosis: Secondary | ICD-10-CM

## 2017-08-08 ENCOUNTER — Encounter: Payer: Self-pay | Admitting: *Deleted

## 2017-08-09 ENCOUNTER — Ambulatory Visit
Admission: RE | Admit: 2017-08-09 | Discharge: 2017-08-09 | Disposition: A | Discharge status: Home / Self Care | Payer: Self-pay | Source: Ambulatory Visit | Attending: Neurology | Admitting: Neurology

## 2017-08-09 DIAGNOSIS — G35 Multiple sclerosis: Secondary | ICD-10-CM

## 2017-08-10 ENCOUNTER — Encounter: Payer: Self-pay | Admitting: Neurology

## 2017-08-10 ENCOUNTER — Ambulatory Visit (INDEPENDENT_AMBULATORY_CARE_PROVIDER_SITE_OTHER): Payer: Self-pay | Admitting: Neurology

## 2017-08-10 DIAGNOSIS — G35 Multiple sclerosis: Secondary | ICD-10-CM

## 2017-08-10 NOTE — Progress Notes (Signed)
Debbie Watts presents for her end of study visit.   She will be restarting Tecfidera.   RTC in 5-6 months, sooner if problems

## 2017-08-15 ENCOUNTER — Telehealth: Payer: Self-pay | Admitting: *Deleted

## 2017-08-15 NOTE — Telephone Encounter (Signed)
PA for Tecfidera 120mg  and 240mg  starter pack, and 240mg  maintenance dose completed via Cover My Meds. Key# [id number].  Dx: MS (G35). Tried and failed meds: Avonex (ineffective). Copaxone, Rebif, Beteseron, Plegridy are all contraindicated due to having failed Avonex and inj. site fatigue/fim

## 2017-08-17 NOTE — Telephone Encounter (Signed)
Fax received from [facility] (phone# [phone]). Tecfidera approved thru 10/24/18.  No PA# given/fim

## 2017-08-19 ENCOUNTER — Encounter: Payer: Self-pay | Admitting: *Deleted

## 2017-08-31 ENCOUNTER — Ambulatory Visit: Payer: Medicare HMO | Admitting: Internal Medicine

## 2017-08-31 ENCOUNTER — Encounter (INDEPENDENT_AMBULATORY_CARE_PROVIDER_SITE_OTHER): Payer: Self-pay

## 2017-08-31 VITALS — BP 146/86 | HR 94 | Temp 98.1°F | Ht 62.0 in | Wt 148.6 lb

## 2017-08-31 DIAGNOSIS — G43909 Migraine, unspecified, not intractable, without status migrainosus: Secondary | ICD-10-CM | POA: Diagnosis not present

## 2017-08-31 DIAGNOSIS — I10 Essential (primary) hypertension: Secondary | ICD-10-CM | POA: Diagnosis not present

## 2017-08-31 DIAGNOSIS — Z0189 Encounter for other specified special examinations: Secondary | ICD-10-CM | POA: Diagnosis not present

## 2017-08-31 DIAGNOSIS — R42 Dizziness and giddiness: Secondary | ICD-10-CM

## 2017-08-31 DIAGNOSIS — F17210 Nicotine dependence, cigarettes, uncomplicated: Secondary | ICD-10-CM

## 2017-08-31 DIAGNOSIS — R238 Other skin changes: Secondary | ICD-10-CM | POA: Insufficient documentation

## 2017-08-31 DIAGNOSIS — E28310 Symptomatic premature menopause: Secondary | ICD-10-CM | POA: Diagnosis not present

## 2017-08-31 DIAGNOSIS — R233 Spontaneous ecchymoses: Secondary | ICD-10-CM

## 2017-08-31 DIAGNOSIS — G4489 Other headache syndrome: Secondary | ICD-10-CM | POA: Diagnosis not present

## 2017-08-31 DIAGNOSIS — Z79899 Other long term (current) drug therapy: Secondary | ICD-10-CM

## 2017-08-31 DIAGNOSIS — R202 Paresthesia of skin: Secondary | ICD-10-CM

## 2017-08-31 DIAGNOSIS — Z23 Encounter for immunization: Secondary | ICD-10-CM

## 2017-08-31 DIAGNOSIS — N951 Menopausal and female climacteric states: Secondary | ICD-10-CM

## 2017-08-31 DIAGNOSIS — R0981 Nasal congestion: Secondary | ICD-10-CM

## 2017-08-31 DIAGNOSIS — G35 Multiple sclerosis: Secondary | ICD-10-CM | POA: Diagnosis not present

## 2017-08-31 DIAGNOSIS — Z79811 Long term (current) use of aromatase inhibitors: Secondary | ICD-10-CM

## 2017-08-31 DIAGNOSIS — L988 Other specified disorders of the skin and subcutaneous tissue: Secondary | ICD-10-CM

## 2017-08-31 DIAGNOSIS — G2581 Restless legs syndrome: Secondary | ICD-10-CM | POA: Diagnosis not present

## 2017-08-31 DIAGNOSIS — G43709 Chronic migraine without aura, not intractable, without status migrainosus: Secondary | ICD-10-CM

## 2017-08-31 DIAGNOSIS — H538 Other visual disturbances: Secondary | ICD-10-CM

## 2017-08-31 NOTE — Patient Instructions (Signed)
It was a pleasure to see you today Debbie Watts.  I am checking blood work today to make sure there is no underlying problem putting you at risk for easy bruising or bleeding. This could be a side effect of medication although it is less common. I will give you a call after getting the results tomorrow and let you know if we need to change treatment. If everything is normal you may need to discuss the symptoms with your neurologist although probably is not a high risk of harm.  Please call our clinic if you need refills of any medications.

## 2017-08-31 NOTE — Progress Notes (Signed)
CC: Establish care with [facility]  HPI:  [name] is a 56 y.o. female with PMHx detailed below presenting for an encounter to establish care with a new provider. Her active problems discussed today include MS, RLS, migraines, hypertension, perimenopausal hot flashes, and poor visual acuity. She is mostly here today to establish medical care. She has previously been seen by Dr. Billey Chang with [facility].  See problem based assessment and plan below for additional details.  Restless leg syndrome Her symptoms are well controlled on carbidopa-levodopa which was started by her neurologist Dr. Felecia Shelling. She has zero night time awakenings on average while taking this medication  Multiple sclerosis ([place]) Followed by Dr. Felecia Shelling in neurology clinic. She last saw him in October and was stable with no recent severe exacerbations. She was on a 2 year trial drug regimen but has been restarted on Tecfidera since her last visit. She does have blurry vision but thinks this is from her out of date glasses prescription rather than episodic vision changes from MS.  Cigarette nicotine dependence without complication She has a very extensive smoking history started at age 42. She used to smoke 1-1.5 ppd but has been trying to reduce this lately largely to support Mr. Meredith Pel who has been recommended to stop repeatedly for his critical peripheral arterial disease. She estimates smoking about 6 cigarettes on average over the past 3 months.  Perimenopausal symptoms She reports episodic hot flashes since about 5 years ago with amenorrhea since 2 years ago. These are not debilitating or excessively distressing.  Essential hypertension She is on lisinopril 5mg  monotherapy for grade 1 hypertension. This is not at goal today which should be <140/90. If it remains high at her next visit we can titrate her medication up to reach goal. Also I am checking a metabolic panel today for monitoring on ACE-I  therapy.  Easy bruising HPI: She has recent increased bruising on her forearms bilaterally in the past month or so. She denies falling or being aware of striking objects. She has nearsightedness but denies episodes of worsened vision or loss of peripheral vision. She sometimes stumbles but catches herself without falling or assistive devices. She denies any other bleeding. She has never had problems with easy bruising in the past. A: Unexplained bruising of forearms. These are on extensor surfaces suggesting hitting objects or falling but she denies this specifically. Her only recent medication change is Tecfidera for MS which is not typically associated with bruising. We will check labs today for liver function, platelets, and hemoglobin. P: CMP, CBC today No specific intervention recommended at this time  Migraine headache Migraine headache managed with neurology clinic. She also seems to have significant non-migraine headache which are bilateral and tension-like. She takes PRN tramadol, cyclobenzaprine, acetaminophen-caffeine, or NSAIDs for headaches. These are occurring about 2-3 times per month. These are not currently frequent or debilitating enough to need a prophylactic treatment plan.    Past Medical History:  Diagnosis Date  . Depression   . Deviated nasal septum   . Hearing loss 06/11/2014   Bilateral  . Herniated cervical disc   . Migraine   . Miscarriage    2  . MS (multiple sclerosis) ([place])     Review of Systems: Review of Systems  Constitutional: Negative for chills and fever.  HENT: Positive for congestion. Negative for sore throat.   Eyes: Positive for blurred vision. Negative for double vision, photophobia and redness.  Respiratory: Negative for cough.   Cardiovascular:  Negative for chest pain and leg swelling.  Gastrointestinal: Negative for blood in stool, constipation and diarrhea.  Genitourinary: Negative for dysuria.  Musculoskeletal: Positive for back  pain. Negative for falls.  Skin: Negative for rash.  Neurological: Positive for dizziness, tingling and headaches.  Endo/Heme/Allergies: Bruises/bleeds easily.  Psychiatric/Behavioral: Negative for depression.     Physical Exam: Vitals:   08/31/17 1352  BP: (!) 146/86  Pulse: 94  Temp: 98.1 F (36.7 C)  TempSrc: Oral  SpO2: 100%  Weight: 148 lb 9.6 oz (67.4 kg)  Height: 5\' 2"  (1.575 m)   GENERAL- alert, co-operative, NAD HEENT- Atraumatic, PERRL, EOMI, oral mucosa appears moist, good and intact dentition, no cervical LN enlargement. CARDIAC- RRR, no murmurs, rubs or gallops. RESP- CTAB, no wheezes or crackles. ABDOMEN- Soft, nontender, no guarding or rebound, normoactive bowel sounds present NEURO- No obvious Cr N abnormality, strength upper and lower extremities- 5/5, Sensation intact globally EXTREMITIES- pulse 2+, symmetric, no pedal edema. SKIN- Warm, dry, patchy bruising on extensor surfaces of her forearms PSYCH- Normal mood and affect, appropriate thought content and speech.   Assessment & Plan:   See encounters tab for problem based medical decision making.   Patient discussed with Dr. Daryll Drown

## 2017-09-01 LAB — CMP14 + ANION GAP
ALBUMIN: 4.3 g/dL (ref 3.5–5.5)
ALK PHOS: 107 IU/L (ref 39–117)
ALT: 48 IU/L — AB (ref 0–32)
ANION GAP: 10 mmol/L (ref 10.0–18.0)
AST: 28 IU/L (ref 0–40)
Albumin/Globulin Ratio: 2 (ref 1.2–2.2)
BUN / CREAT RATIO: 23 (ref 9–23)
BUN: 17 mg/dL (ref 6–24)
Bilirubin Total: <0.2 mg/dL (ref 0.0–1.2)
CO2: 28 mmol/L (ref 20–29)
CREATININE: 0.73 mg/dL (ref 0.57–1.00)
Calcium: 8.8 mg/dL (ref 8.7–10.2)
Chloride: 102 mmol/L (ref 96–106)
GFR calc non Af Amer: 93 mL/min/{1.73_m2} (ref >59)
GFR, EST AFRICAN AMERICAN: 107 mL/min/{1.73_m2} (ref >59)
GLOBULIN, TOTAL: 2.1 g/dL (ref 1.5–4.5)
Glucose: 87 mg/dL (ref 65–99)
Potassium: 4.6 mmol/L (ref 3.5–5.2)
SODIUM: 140 mmol/L (ref 134–144)
TOTAL PROTEIN: 6.4 g/dL (ref 6.0–8.5)

## 2017-09-01 LAB — CBC
HEMATOCRIT: 40.2 % (ref 34.0–46.6)
Hemoglobin: 13.5 g/dL (ref 11.1–15.9)
MCH: 29.9 pg (ref 26.6–33.0)
MCHC: 33.6 g/dL (ref 31.5–35.7)
MCV: 89 fL (ref 79–97)
PLATELETS: 316 10*3/uL (ref 150–379)
RBC: 4.51 x10E6/uL (ref 3.77–5.28)
RDW: 13.1 % (ref 12.3–15.4)
WBC: 6.5 10*3/uL (ref 3.4–10.8)

## 2017-09-02 ENCOUNTER — Encounter: Payer: Self-pay | Admitting: Internal Medicine

## 2017-09-02 NOTE — Assessment & Plan Note (Signed)
She reports episodic hot flashes since about 5 years ago with amenorrhea since 2 years ago. These are not debilitating or excessively distressing.

## 2017-09-02 NOTE — Assessment & Plan Note (Addendum)
She is on lisinopril 5mg  monotherapy for grade 1 hypertension. This is not at goal today which should be <140/90. If it remains high at her next visit we can titrate her medication up to reach goal. Also I am checking a metabolic panel today for monitoring on ACE-I therapy.

## 2017-09-02 NOTE — Assessment & Plan Note (Signed)
Her symptoms are well controlled on carbidopa-levodopa which was started by her neurologist Dr. Felecia Shelling. She has zero night time awakenings on average while taking this medication

## 2017-09-02 NOTE — Assessment & Plan Note (Signed)
Followed by Dr. Felecia Shelling in neurology clinic. She last saw him in October and was stable with no recent severe exacerbations. She was on a 2 year trial drug regimen but has been restarted on Tecfidera since her last visit. She does have blurry vision but thinks this is from her out of date glasses prescription rather than episodic vision changes from MS.

## 2017-09-02 NOTE — Assessment & Plan Note (Signed)
Migraine headache managed with neurology clinic. She also seems to have significant non-migraine headache which are bilateral and tension-like. She takes PRN tramadol, cyclobenzaprine, acetaminophen-caffeine, or NSAIDs for headaches. These are occurring about 2-3 times per month. These are not currently frequent or debilitating enough to need a prophylactic treatment plan.

## 2017-09-02 NOTE — Assessment & Plan Note (Signed)
HPI: She has recent increased bruising on her forearms bilaterally in the past month or so. She denies falling or being aware of striking objects. She has nearsightedness but denies episodes of worsened vision or loss of peripheral vision. She sometimes stumbles but catches herself without falling or assistive devices. She denies any other bleeding. She has never had problems with easy bruising in the past. A: Unexplained bruising of forearms. These are on extensor surfaces suggesting hitting objects or falling but she denies this specifically. Her only recent medication change is Tecfidera for MS which is not typically associated with bruising. We will check labs today for liver function, platelets, and hemoglobin. P: CMP, CBC today No specific intervention recommended at this time

## 2017-09-02 NOTE — Assessment & Plan Note (Signed)
She has a very extensive smoking history started at age 56. She used to smoke 1-1.5 ppd but has been trying to reduce this lately largely to support Mr. Meredith Pel who has been recommended to stop repeatedly for his critical peripheral arterial disease. She estimates smoking about 6 cigarettes on average over the past 3 months.

## 2017-09-05 NOTE — Progress Notes (Signed)
Internal Medicine Clinic Attending  Case discussed with Dr. Rice at the time of the visit.  We reviewed the resident's history and exam and pertinent patient test results.  I agree with the assessment, diagnosis, and plan of care documented in the resident's note.  

## 2017-12-07 ENCOUNTER — Ambulatory Visit (INDEPENDENT_AMBULATORY_CARE_PROVIDER_SITE_OTHER): Payer: Medicare HMO | Admitting: Internal Medicine

## 2017-12-07 ENCOUNTER — Encounter (INDEPENDENT_AMBULATORY_CARE_PROVIDER_SITE_OTHER): Payer: Self-pay

## 2017-12-07 ENCOUNTER — Other Ambulatory Visit: Payer: Self-pay

## 2017-12-07 ENCOUNTER — Telehealth: Payer: Self-pay | Admitting: Internal Medicine

## 2017-12-07 VITALS — BP 117/79 | HR 73 | Temp 97.9°F | Ht 63.0 in | Wt 148.0 lb

## 2017-12-07 DIAGNOSIS — R358 Other polyuria: Secondary | ICD-10-CM

## 2017-12-07 DIAGNOSIS — F339 Major depressive disorder, recurrent, unspecified: Secondary | ICD-10-CM | POA: Diagnosis not present

## 2017-12-07 DIAGNOSIS — Z79899 Other long term (current) drug therapy: Secondary | ICD-10-CM

## 2017-12-07 DIAGNOSIS — R631 Polydipsia: Secondary | ICD-10-CM | POA: Diagnosis not present

## 2017-12-07 DIAGNOSIS — M5126 Other intervertebral disc displacement, lumbar region: Secondary | ICD-10-CM

## 2017-12-07 DIAGNOSIS — G35 Multiple sclerosis: Secondary | ICD-10-CM | POA: Diagnosis not present

## 2017-12-07 DIAGNOSIS — I10 Essential (primary) hypertension: Secondary | ICD-10-CM | POA: Diagnosis not present

## 2017-12-07 DIAGNOSIS — R58 Hemorrhage, not elsewhere classified: Secondary | ICD-10-CM

## 2017-12-07 DIAGNOSIS — M5416 Radiculopathy, lumbar region: Secondary | ICD-10-CM

## 2017-12-07 DIAGNOSIS — R5382 Chronic fatigue, unspecified: Secondary | ICD-10-CM

## 2017-12-07 DIAGNOSIS — R3589 Other polyuria: Secondary | ICD-10-CM

## 2017-12-07 LAB — GLUCOSE, CAPILLARY: Glucose-Capillary: 87 mg/dL (ref 65–99)

## 2017-12-07 LAB — POCT GLYCOSYLATED HEMOGLOBIN (HGB A1C): Hemoglobin A1C: 5.3

## 2017-12-07 MED ORDER — PAROXETINE HCL 20 MG PO TABS: 20.0000 mg | ORAL_TABLET | Freq: Every day | ORAL | 0 refills | Status: DC

## 2017-12-07 MED ORDER — DICLOFENAC SODIUM 75 MG PO TBEC: 75.0000 mg | DELAYED_RELEASE_TABLET | Freq: Two times a day (BID) | ORAL | 0 refills | Status: DC

## 2017-12-07 MED ORDER — CYCLOBENZAPRINE HCL 10 MG PO TABS: 10.0000 mg | ORAL_TABLET | Freq: Every day | ORAL | 1 refills | Status: DC

## 2017-12-07 MED ORDER — LISINOPRIL 5 MG PO TABS: 5.0000 mg | ORAL_TABLET | Freq: Every day | ORAL | 1 refills | Status: DC

## 2017-12-07 NOTE — Telephone Encounter (Signed)
Patient needs all medicine to be sent through [facility], please resent

## 2017-12-07 NOTE — Patient Instructions (Signed)
It was a pleasure to see you today [name]. Please make the following changes:  Please continue taking all your medication   If you have any questions or concerns, please call our clinic at [phone] between 9am-5pm and after hours call [phone] and ask for the internal medicine resident on call. If you feel you are having a medical emergency please call 911.   Thank you, we look forward to help you remain healthy!  Debbie Mage, MD Internal Medicine PGY1

## 2017-12-07 NOTE — Progress Notes (Signed)
   CC: Hypertension follow-up  HPI:  [name] is a 57 y.o. with multiple sclerosis, hypertension, depression, and lumbar herniated disc who presents for medication refill. Please see problem based charting for evaluation, assessment, and plan.  Past Medical History:  Diagnosis Date  . Depression   . Deviated nasal septum   . Hearing loss 06/11/2014   Bilateral  . Herniated cervical disc   . Migraine   . Miscarriage    2  . MS (multiple sclerosis) ([place])    Review of Systems: Denies any shortness of breath, chest pain.  Numbness in right hand.  Physical Exam:  Vitals:   12/07/17 1401  BP: 117/79  Pulse: 73  Temp: 97.9 F (36.6 C)  TempSrc: Oral  SpO2: 100%  Weight: 148 lb (67.1 kg)  Height: 5\' 3"  (1.6 m)   Physical Exam  Constitutional: She appears well-developed and well-nourished. No distress.  HENT:  Head: Normocephalic and atraumatic.  Eyes: Conjunctivae are normal.  Cardiovascular: Normal rate, regular rhythm and normal heart sounds.  Respiratory: Effort normal and breath sounds normal. No respiratory distress. She has no wheezes.  GI: Soft. She exhibits no distension. There is no tenderness.  Musculoskeletal: She exhibits no edema.  Neurological: She is alert.  Skin: No erythema.  Psychiatric: She has a normal mood and affect. Her behavior is normal. Judgment and thought content normal.    Assessment & Plan:   See Encounters Tab for problem based charting.  Patient discussed with Dr. Rebeca Alert

## 2017-12-08 ENCOUNTER — Other Ambulatory Visit: Payer: Self-pay | Admitting: *Deleted

## 2017-12-08 DIAGNOSIS — R358 Other polyuria: Secondary | ICD-10-CM | POA: Insufficient documentation

## 2017-12-08 DIAGNOSIS — R3589 Other polyuria: Secondary | ICD-10-CM | POA: Insufficient documentation

## 2017-12-08 DIAGNOSIS — G35 Multiple sclerosis: Secondary | ICD-10-CM

## 2017-12-08 DIAGNOSIS — R5382 Chronic fatigue, unspecified: Secondary | ICD-10-CM

## 2017-12-08 DIAGNOSIS — R58 Hemorrhage, not elsewhere classified: Secondary | ICD-10-CM

## 2017-12-08 NOTE — Assessment & Plan Note (Signed)
Patient is taking paroxetine 20 mg daily for major depressive disorder.  She states that she has not had any mood changes she is PHQ 9 score was 1.  -Refilled patient's paroxetine 20 mg daily

## 2017-12-08 NOTE — Telephone Encounter (Signed)
done

## 2017-12-08 NOTE — Telephone Encounter (Signed)
Refills approved.

## 2017-12-08 NOTE — Assessment & Plan Note (Signed)
The patient presented stating that she has been having polyuria, polydipsia for the past few weeks.  She also states that she has right hand numbness without any recent trauma to the area.  The patient has gained 20 pounds over the past 2 years.   She was evaluated for diabetes and had an A1c 5.3 with a random glucose of 87.  Therefore, the patient does not have diabetes at this time.  -Continue to monitor the patient

## 2017-12-08 NOTE — Assessment & Plan Note (Signed)
The patient's blood pressure during this visit was 117/79.  She is currently being controlled on lisinopril 5 mg daily.  -Refilled patient's lisinopril 5 mg daily

## 2017-12-14 NOTE — Progress Notes (Signed)
Internal Medicine Clinic Attending  Case discussed with Dr. Chundi  at the time of the visit.  We reviewed the resident's history and exam and pertinent patient test results.  I agree with the assessment, diagnosis, and plan of care documented in the resident's note.  Alexander N Raines, MD   

## 2017-12-19 ENCOUNTER — Telehealth: Payer: Self-pay | Admitting: *Deleted

## 2017-12-19 MED ORDER — DIMETHYL FUMARATE 240 MG PO CPDR: 240.0000 mg | DELAYED_RELEASE_CAPSULE | Freq: Two times a day (BID) | ORAL | 3 refills | Status: DC

## 2017-12-19 NOTE — Telephone Encounter (Signed)
Received message from [place] in [place] that pt. needs r/f of Tecfidera.  I have spoken with pt. who requests r/f be faxed to [facility] fax# [phone].  I have done this today/fim

## 2017-12-27 ENCOUNTER — Telehealth: Payer: Self-pay | Admitting: *Deleted

## 2017-12-27 ENCOUNTER — Other Ambulatory Visit: Payer: Self-pay | Admitting: Neurology

## 2017-12-27 NOTE — Telephone Encounter (Signed)
Refilled Sinemet IR for 6 months per Dr. Felecia Shelling. Pt needs to be seen by him in office. Placed in pt's sig to schedule appt by April.

## 2017-12-28 NOTE — Telephone Encounter (Signed)
Noted/fim 

## 2017-12-28 NOTE — Telephone Encounter (Signed)
Note pt has appt on 02/08/18 @ 2:30 PM.

## 2018-01-04 ENCOUNTER — Other Ambulatory Visit: Payer: Self-pay | Admitting: Internal Medicine

## 2018-01-05 ENCOUNTER — Other Ambulatory Visit: Payer: Self-pay | Admitting: Internal Medicine

## 2018-01-05 DIAGNOSIS — F339 Major depressive disorder, recurrent, unspecified: Secondary | ICD-10-CM

## 2018-01-06 NOTE — Telephone Encounter (Signed)
Refill Approved

## 2018-01-10 ENCOUNTER — Telehealth: Payer: Self-pay | Admitting: Neurology

## 2018-01-10 NOTE — Telephone Encounter (Signed)
Spoke with Debbie Watts. She finished participation in an [place] clinical trial in October, started Tecfidera and needs f/u.  Has questions about several meds rx'd by different doctors. Appt. moved from 4/17 to 01/12/18 at 10:30, arrival time 1000/fim

## 2018-01-10 NOTE — Telephone Encounter (Signed)
Pt has called asking for a refill on  carbidopa-levodopa (SINEMET IR) 10-100 MG tablet  gabapentin (NEURONTIN) 300 MG capsule  traMADol (ULTRAM) 50 MG tablet  Pt asking they be sent to  [id number] [address], [place] (Phone) [phone] (Fax)

## 2018-01-12 ENCOUNTER — Encounter: Payer: Self-pay | Admitting: Neurology

## 2018-01-12 ENCOUNTER — Ambulatory Visit: Payer: Medicare HMO | Admitting: Neurology

## 2018-01-12 VITALS — BP 140/99 | HR 100 | Resp 18 | Ht 63.0 in | Wt 147.5 lb

## 2018-01-12 DIAGNOSIS — R358 Other polyuria: Secondary | ICD-10-CM

## 2018-01-12 DIAGNOSIS — R202 Paresthesia of skin: Secondary | ICD-10-CM

## 2018-01-12 DIAGNOSIS — G2581 Restless legs syndrome: Secondary | ICD-10-CM

## 2018-01-12 DIAGNOSIS — R269 Unspecified abnormalities of gait and mobility: Secondary | ICD-10-CM | POA: Insufficient documentation

## 2018-01-12 DIAGNOSIS — R3589 Other polyuria: Secondary | ICD-10-CM

## 2018-01-12 DIAGNOSIS — G35 Multiple sclerosis: Secondary | ICD-10-CM

## 2018-01-12 DIAGNOSIS — G43709 Chronic migraine without aura, not intractable, without status migrainosus: Secondary | ICD-10-CM

## 2018-01-12 HISTORY — DX: Unspecified abnormalities of gait and mobility: R26.9

## 2018-01-12 MED ORDER — OXYBUTYNIN CHLORIDE 5 MG PO TABS: 5.0000 mg | ORAL_TABLET | Freq: Two times a day (BID) | ORAL | 5 refills | Status: DC

## 2018-01-12 MED ORDER — GABAPENTIN 300 MG PO CAPS: 600.0000 mg | ORAL_CAPSULE | Freq: Two times a day (BID) | ORAL | 11 refills | Status: DC

## 2018-01-12 MED ORDER — TRAMADOL HCL 50 MG PO TABS: 50.0000 mg | ORAL_TABLET | Freq: Two times a day (BID) | ORAL | 5 refills | Status: DC | PRN

## 2018-01-12 NOTE — Progress Notes (Signed)
[facility]  PATIENT: Debbie Watts DOB: Aug 01, 1961    HISTORICAL  CHIEF COMPLAINT:  Chief Complaint  Patient presents with  . Multiple Sclerosis    Sts. she is tolerating Tecfidera well.  Needs r/f of Gabapentin, Tecfidera and Sinemet/fim    HISTORY OF PRESENT ILLNESS:  Debbie Watts is a 57 year old woman with multiple sclerosis.  She is currently on Tecfidera.  Before that she was on a study drug (ALKS 8700) that was very similar to take daily.  She feels her MS is doing well.  She has not had any exacerbations.  She is tolerating the medication well with no GI side effects or flushing.  She is walking ok and could walk 20 minutes or about a mile if she had to.     However, her back hurts if she stands a long time or walks longer.    She feels a little weaker on her right side and she has numbness in her right hand.    She has noted more urinary urgency the past year.    She wakes up 2 -3 times nightly for nocturia.  Other times, she notes hesitancy and sometimes does not completely empty.   She has no recent UTI.    Vision is stable.   She wearss glasses and has not had ON or diplopia in the past.      She has fatigue.    She has some sleep onset insomnia.   She takes Sinemet and gabeontin for RLS with benefit.  She wakes up to use the bathroom and sometimes has trouble falling back asleep.    She has some stress with her boyfriend needing a foot amputation (DM, gangrene).   She sometimes has mild depression but not enough to consider a medication.   Cognitively, she is doing well.     MS history: She was diagnosed with MS in 1999 based on MRI and lumbar rupture.  At that time she was living in [place] and was seeing Dr. Tilden Fossa.  When he retired she saw another doctor and then she moved to this area around 2015 but did not seek neurologic referral until 2015.  She was on Avonex for many years but stopped due to insurance.  Then, in November 2016, she started in the  drug study and was on ALKS 8700 4 2 years.  The study ended in late 2018 for her and she switched over to [place].   Her last MRI 08/09/2017 showed stable MS lesions.       REVIEW OF SYSTEMS: Constitutional: No fevers, chills, sweats, or change in appetite.  She has fatigue.  She sometimes has insomnia. Eyes: No visual changes, double vision, eye pain Ear, nose and throat: No hearing loss, ear pain, nasal congestion, sore throat Cardiovascular: No chest pain, palpitations Respiratory: No shortness of breath at rest or with exertion.   No wheezes GastrointestinaI: No nausea, vomiting, diarrhea, abdominal pain, fecal incontinence Genitourinary:She has urinary frequency and nocturia . Musculoskeletal: No neck pain, back pain Integumentary: No rash, pruritus, skin lesions Neurological: as above Psychiatric: No depression at this time.  No anxiety Endocrine: No palpitations, diaphoresis, change in appetite, change in weigh or increased thirst Hematologic/Lymphatic: No anemia, purpura, petechiae. Allergic/Immunologic: No itchy/runny eyes, nasal congestion, recent allergic reactions, rashes  ALLERGIES: Allergies  Allergen Reactions  . Procaine Shortness Of Breath    HOME MEDICATIONS:  Current Outpatient Medications:  .  Acetaminophen-Caffeine (TENSION HEADACHE RELIEF PO), Take by mouth., Disp: , Rfl:  .  betamethasone valerate lotion (VALISONE) 0.1 %, APPLY 1 APPLICATION TOPICALLY 2 TIMES DAILY., Disp: , Rfl:  .  carbidopa-levodopa (SINEMET IR) 10-100 MG tablet, Take 1 tablet by mouth at bedtime. PLEASE SCHEDULE APPOINTMENT BY April 2019., Disp: 90 tablet, Rfl: 1 .  diclofenac (VOLTAREN) 75 MG EC tablet, TAKE 1 TABLET BY MOUTH TWICE A DAY, Disp: 60 tablet, Rfl: 0 .  Dimethyl Fumarate 240 MG CPDR, Take 1 capsule (240 mg total) by mouth 2 (two) times daily., Disp: 180 capsule, Rfl: 3 .  gabapentin (NEURONTIN) 300 MG capsule, Take 2 capsules (600 mg total) by mouth 2 (two) times daily.,  Disp: 60 capsule, Rfl: 11 .  lisinopril (PRINIVIL,ZESTRIL) 5 MG tablet, TAKE 1 TABLET EVERY DAY, Disp: , Rfl:  .  PARoxetine (PAXIL) 20 MG tablet, TAKE 1 TABLET BY MOUTH EVERY DAY, Disp: 30 tablet, Rfl: 0 .  Tiotropium Bromide Monohydrate 1.25 MCG/ACT AERS, Inhale into the lungs., Disp: , Rfl:  .  traMADol (ULTRAM) 50 MG tablet, Take 1 tablet (50 mg total) by mouth 2 (two) times daily as needed., Disp: 60 tablet, Rfl: 5 .  VENTOLIN HFA 108 (90 Base) MCG/ACT inhaler, , Disp: , Rfl:  .  eletriptan (RELPAX) 40 MG tablet, Take 40 mg by mouth as needed for migraine or headache. One tablet by mouth at onset of headache. May repeat in 2 hours if headache persists or recurs., Disp: , Rfl:  .  ferrous sulfate 325 (65 FE) MG tablet, Take 325 mg by mouth daily., Disp: , Rfl:  .  lisinopril (PRINIVIL,ZESTRIL) 5 MG tablet, Take 1 tablet (5 mg total) by mouth daily., Disp: 30 tablet, Rfl: 1 .  oxybutynin (DITROPAN) 5 MG tablet, Take 1 tablet (5 mg total) by mouth 2 (two) times daily., Disp: 60 tablet, Rfl: 5  PAST MEDICAL HISTORY: Past Medical History:  Diagnosis Date  . Depression   . Deviated nasal septum   . Hearing loss 06/11/2014   Bilateral  . Herniated cervical disc   . Migraine   . Miscarriage    2  . MS (multiple sclerosis) ([place])     PAST SURGICAL HISTORY: Past Surgical History:  Procedure Laterality Date  . NASAL SEPTUM SURGERY    . spinal injections      FAMILY HISTORY: Family History  Adopted: Yes  Problem Relation Age of Onset  . Heart disease Father     SOCIAL HISTORY:  Social History   Socioeconomic History  . Marital status: Single    Spouse name: Not on file  . Number of children: 2  . Years of education: 16  . Highest education level: Not on file  Occupational History  . Not on file  Social Needs  . Financial resource strain: Not on file  . Food insecurity:    Worry: Not on file    Inability: Not on file  . Transportation needs:    Medical: Not on file     Non-medical: Not on file  Tobacco Use  . Smoking status: Current Every Day Smoker    Packs/day: 0.30    Years: 44.00    Pack years: 13.20    Types: Cigarettes  . Smokeless tobacco: Never Used  . Tobacco comment: Previous 1-15 ppd  Substance and Sexual Activity  . Alcohol use: Yes    Comment: 2-3 beers daily  . Drug use: No  . Sexual activity: Not on file  Lifestyle  . Physical activity:    Days per week: Not on file  Minutes per session: Not on file  . Stress: Not on file  Relationships  . Social connections:    Talks on phone: Not on file    Gets together: Not on file    Attends religious service: Not on file    Active member of club or organization: Not on file    Attends meetings of clubs or organizations: Not on file    Relationship status: Not on file  . Intimate partner violence:    Fear of current or ex partner: Not on file    Emotionally abused: Not on file    Physically abused: Not on file    Forced sexual activity: Not on file  Other Topics Concern  . Not on file  Social History Narrative   Patient is single with 2 children.   Patient is right handed.   Patient has high school education.   Patient drinks 2 cups daily.     PHYSICAL EXAM  Vitals:   01/12/18 1037  BP: (!) 140/99  Pulse: 100  Resp: 18  Weight: 147 lb 8 oz (66.9 kg)  Height: 5\' 3"  (1.6 m)    Body mass index is 26.13 kg/m.   General: The patient is well-developed and well-nourished and in no acute distress   Neurologic Exam  Mental status: The patient is alert and oriented x 3 at the time of the examination. The patient has apparent normal recent and remote memory, with an apparently normal attention span and concentration ability.   Speech is normal.  Cranial nerves: Extraocular movements are full. Pupils are equal, round, and reactive to light and accomodation.  Visual fields are full.  Facial symmetry is present. There is good facial sensation to soft touch bilaterally.Facial  strength is normal.  Trapezius and sternocleidomastoid strength is normal. No dysarthria is noted.  The tongue is midline, and the patient has symmetric elevation of the soft palate. No obvious hearing deficits are noted.  Motor:  Muscle bulk is normal.   Tone is normal. Strength is  5 / 5 in all 4 extremities.   Sensory: Sensory testing is intact to pinprick, soft touch and vibration sensation in all 4 extremities.  Coordination: Cerebellar testing shows good finger-nose-finger bilaterally.  Heel to shin is slightly off.    Gait and station: Station is normal.   Gait is wide and tandem gait is very wide.  Romberg is negative.   Reflexes: Deep tendon reflexes are symmetric and normal in arms, increased in legs but no clonus at ankles.   Plantar responses are flexor.    DIAGNOSTIC DATA (LABS, IMAGING, TESTING) - I reviewed patient records, labs, notes, testing and imaging myself where available.  Lab Results  Component Value Date   WBC 6.5 08/31/2017   HGB 13.5 08/31/2017   HCT 40.2 08/31/2017   MCV 89 08/31/2017   PLT 316 08/31/2017      Component Value Date/Time   NA 140 08/31/2017 1524   K 4.6 08/31/2017 1524   CL 102 08/31/2017 1524   CO2 28 08/31/2017 1524   GLUCOSE 87 08/31/2017 1524   BUN 17 08/31/2017 1524   CREATININE 0.73 08/31/2017 1524   CALCIUM 8.8 08/31/2017 1524   PROT 6.4 08/31/2017 1524   ALBUMIN 4.3 08/31/2017 1524   AST 28 08/31/2017 1524   ALT 48 (H) 08/31/2017 1524   ALKPHOS 107 08/31/2017 1524   BILITOT <0.2 08/31/2017 1524   GFRNONAA 93 08/31/2017 1524   GFRAA 107 08/31/2017 1524   No  results found for: CHOL, HDL, LDLCALC, LDLDIRECT, TRIG, CHOLHDL Lab Results  Component Value Date   HGBA1C 5.3 12/07/2017   Lab Results  Component Value Date   VITAMINB12 586 07/02/2015   No results found for: TSH     ASSESSMENT AND PLAN  Multiple sclerosis (HCC)  MS (multiple sclerosis) ([place]) - Plan: gabapentin (NEURONTIN) 300 MG capsule  Gait  disturbance  Restless leg syndrome  Polyuria  Paresthesias  Chronic migraine without aura without status migrainosus, not intractable   1.    Continue Tecfidera for MS.  She will be having a study visit soon and a CBC will be checked at that time. 2.    Continue other medications.  Tramadol, gabapentin and oxybutynin will be renewed. 3.  Try to quit smoking if possible.      4.   Stay active and exercise as tolerated.     5.   Return in 6 months or sooner if there are new or worsening neurologic symptoms.   Richard A. Felecia Shelling, MD, [facility] 04/13/3558, 74:16 AM Certified in Neurology, Clinical Neurophysiology, Sleep Medicine, Pain Medicine and Neuroimaging  [facility] [address] [place], [place] [address] [phone]

## 2018-01-31 ENCOUNTER — Other Ambulatory Visit: Payer: Self-pay | Admitting: Internal Medicine

## 2018-01-31 DIAGNOSIS — I10 Essential (primary) hypertension: Secondary | ICD-10-CM

## 2018-02-01 NOTE — Telephone Encounter (Signed)
I will refill prescription for 1 month, but please schedule an appointment for pt.

## 2018-02-02 ENCOUNTER — Other Ambulatory Visit: Payer: Self-pay | Admitting: *Deleted

## 2018-02-02 DIAGNOSIS — F339 Major depressive disorder, recurrent, unspecified: Secondary | ICD-10-CM

## 2018-02-03 MED ORDER — PAROXETINE HCL 20 MG PO TABS: 20.0000 mg | ORAL_TABLET | Freq: Every day | ORAL | 0 refills | Status: DC

## 2018-02-03 NOTE — Telephone Encounter (Signed)
Refill Approved

## 2018-02-04 ENCOUNTER — Other Ambulatory Visit: Payer: Self-pay | Admitting: Internal Medicine

## 2018-02-05 ENCOUNTER — Other Ambulatory Visit: Payer: Self-pay | Admitting: Internal Medicine

## 2018-02-05 DIAGNOSIS — F339 Major depressive disorder, recurrent, unspecified: Secondary | ICD-10-CM

## 2018-02-06 NOTE — Telephone Encounter (Signed)
Refill approved.

## 2018-02-06 NOTE — Telephone Encounter (Addendum)
Appt with PCP 03/23/2018

## 2018-02-08 ENCOUNTER — Ambulatory Visit: Payer: Medicare HMO | Admitting: Neurology

## 2018-02-09 ENCOUNTER — Other Ambulatory Visit: Payer: Self-pay | Admitting: Internal Medicine

## 2018-02-09 DIAGNOSIS — F339 Major depressive disorder, recurrent, unspecified: Secondary | ICD-10-CM

## 2018-02-09 NOTE — Telephone Encounter (Signed)
Resend Rx for Voltaren without request for Paxil as Paxil was re-ordered on 4/12

## 2018-02-13 ENCOUNTER — Other Ambulatory Visit: Payer: Self-pay | Admitting: *Deleted

## 2018-02-13 MED ORDER — DICLOFENAC SODIUM 75 MG PO TBEC: 75.0000 mg | DELAYED_RELEASE_TABLET | Freq: Two times a day (BID) | ORAL | 0 refills | Status: DC

## 2018-02-13 NOTE — Telephone Encounter (Signed)
Refill Approved

## 2018-02-17 ENCOUNTER — Encounter: Payer: Self-pay | Admitting: *Deleted

## 2018-02-24 ENCOUNTER — Other Ambulatory Visit: Payer: Self-pay | Admitting: Internal Medicine

## 2018-02-24 DIAGNOSIS — I10 Essential (primary) hypertension: Secondary | ICD-10-CM

## 2018-02-25 NOTE — Telephone Encounter (Signed)
Refill Approved

## 2018-03-04 ENCOUNTER — Other Ambulatory Visit: Payer: Self-pay | Admitting: Internal Medicine

## 2018-03-15 ENCOUNTER — Ambulatory Visit (INDEPENDENT_AMBULATORY_CARE_PROVIDER_SITE_OTHER): Payer: Medicare HMO | Admitting: Internal Medicine

## 2018-03-15 ENCOUNTER — Other Ambulatory Visit: Payer: Self-pay

## 2018-03-15 VITALS — BP 124/75 | HR 78 | Temp 98.1°F | Ht 63.0 in | Wt 144.3 lb

## 2018-03-15 DIAGNOSIS — F17210 Nicotine dependence, cigarettes, uncomplicated: Secondary | ICD-10-CM

## 2018-03-15 DIAGNOSIS — Z87440 Personal history of urinary (tract) infections: Secondary | ICD-10-CM | POA: Diagnosis not present

## 2018-03-15 DIAGNOSIS — B9620 Unspecified Escherichia coli [E. coli] as the cause of diseases classified elsewhere: Secondary | ICD-10-CM | POA: Diagnosis not present

## 2018-03-15 DIAGNOSIS — N3000 Acute cystitis without hematuria: Secondary | ICD-10-CM

## 2018-03-15 DIAGNOSIS — R358 Other polyuria: Secondary | ICD-10-CM

## 2018-03-15 DIAGNOSIS — R3589 Other polyuria: Secondary | ICD-10-CM

## 2018-03-15 LAB — POCT URINALYSIS DIPSTICK
Bilirubin, UA: NEGATIVE
Glucose, UA: NEGATIVE
Ketones, UA: NEGATIVE
Nitrite, UA: NEGATIVE
PH UA: 7.5 (ref 5.0–8.0)
PROTEIN UA: NEGATIVE
Spec Grav, UA: 1.01 (ref 1.010–1.025)
UROBILINOGEN UA: 0.2 U/dL

## 2018-03-15 MED ORDER — SULFAMETHOXAZOLE-TRIMETHOPRIM 800-160 MG PO TABS: 1.0000 | ORAL_TABLET | Freq: Two times a day (BID) | ORAL | 0 refills | Status: AC

## 2018-03-15 NOTE — Progress Notes (Signed)
CC: dysuria, polyuria  HPI:  DebbieDebbie Watts is a 57 y.o. with PMH below.  She is here to address recent worsening of polyuria and new dysuria.     Please see A&P for status of the patient's chronic medical conditions  Past Medical History:  Diagnosis Date  . Depression   . Deviated nasal septum   . Hearing loss 06/11/2014   Bilateral  . Herniated cervical disc   . Migraine   . Miscarriage    2  . MS (multiple sclerosis) ([place])    Review of Systems:  ROS: Pulmonary: pt denies increased work of breathing, shortness of breath,  Cardiac: pt denies palpitations, chest pain,  Abdominal: pt denies abdominal pain, nausea, vomiting, or diarrhea  Physical Exam:  Vitals:   03/15/18 1330  BP: 124/75  Pulse: 78  Temp: 98.1 F (36.7 C)  TempSrc: Oral  SpO2: 98%  Weight: 144 lb 4.8 oz (65.5 kg)  Height: 5\' 3"  (1.6 m)   Physical Exam  Constitutional: No distress.  Cardiovascular: Normal rate, regular rhythm and normal heart sounds. Exam reveals no gallop and no friction rub.  No murmur heard. Pulmonary/Chest: Effort normal and breath sounds normal. No respiratory distress. She has no wheezes. She has no rales. She exhibits no tenderness.  Abdominal: Soft. Bowel sounds are normal. She exhibits no distension and no mass. There is no tenderness. There is no rebound and no guarding.  Neurological: She is alert.  Skin: She is not diaphoretic.    Social History   Socioeconomic History  . Marital status: Single    Spouse name: Not on file  . Number of children: 2  . Years of education: 52  . Highest education level: Not on file  Occupational History  . Not on file  Social Needs  . Financial resource strain: Not on file  . Food insecurity:    Worry: Not on file    Inability: Not on file  . Transportation needs:    Medical: Not on file    Non-medical: Not on file  Tobacco Use  . Smoking status: Current Every Day Smoker    Packs/day: 0.50    Years: 44.00    Pack years:  22.00    Types: Cigarettes  . Smokeless tobacco: Never Used  . Tobacco comment: 1/2 pack per day   Substance and Sexual Activity  . Alcohol use: Yes    Comment: 2-3 beers daily  . Drug use: No  . Sexual activity: Not on file  Lifestyle  . Physical activity:    Days per week: Not on file    Minutes per session: Not on file  . Stress: Not on file  Relationships  . Social connections:    Talks on phone: Not on file    Gets together: Not on file    Attends religious service: Not on file    Active member of club or organization: Not on file    Attends meetings of clubs or organizations: Not on file    Relationship status: Not on file  . Intimate partner violence:    Fear of current or ex partner: Not on file    Emotionally abused: Not on file    Physically abused: Not on file    Forced sexual activity: Not on file  Other Topics Concern  . Not on file  Social History Narrative   Patient is single with 2 children.   Patient is right handed.   Patient has high school education.  Patient drinks 2 cups daily.    Family History  Adopted: Yes  Problem Relation Age of Onset  . Heart disease Father     Assessment & Plan:   See Encounters Tab for problem based charting.  Patient discussed with Dr. Dareen Piano

## 2018-03-15 NOTE — Patient Instructions (Signed)
We will empirically treat you for your UTI with a medication called bactrim.  Please take until you are out as prescribed this should cure your UTI.  We are also taking a urine culture, if therapy needs to be changed based on the culture result we will call you and change therapy.

## 2018-03-16 DIAGNOSIS — N3000 Acute cystitis without hematuria: Secondary | ICD-10-CM

## 2018-03-16 HISTORY — DX: Acute cystitis without hematuria: N30.00

## 2018-03-16 NOTE — Assessment & Plan Note (Signed)
Pt requires refills on medications with associated diagnosis above.  Reviewed disease process and find this medication to be necessary, will not change dose or alter current therapy. 

## 2018-03-16 NOTE — Assessment & Plan Note (Signed)
She reports that her polyuria improved with oxybutinin started 4/16. but felt since then she has developed a urinary tract infection.  She has noticed dysuria for the last 8 days and increased frequency no longer controlled with oxybutinin. No fever or chills.  Last UTI symptoms many years ago but states it feels just like this.   -urine dip suggestive of possible infection -urine culture ordered, will treat empirically with bactrim DS for 3 days

## 2018-03-17 LAB — URINE CULTURE

## 2018-03-17 NOTE — Progress Notes (Signed)
Internal Medicine Clinic Attending  Case discussed with Dr. Shan Levans at the time of the visit.  We reviewed the resident's history and exam and pertinent patient test results.  I agree with the assessment, diagnosis, and plan of care documented in the resident's note.  Urine cultures positive for greater than 100,000 colonies of Escherichia coli.  Will await sensitivities.  Continue with Bactrim for now.

## 2018-03-23 ENCOUNTER — Encounter: Payer: Medicare HMO | Admitting: Internal Medicine

## 2018-03-23 ENCOUNTER — Other Ambulatory Visit: Payer: Self-pay | Admitting: Internal Medicine

## 2018-03-23 DIAGNOSIS — I10 Essential (primary) hypertension: Secondary | ICD-10-CM

## 2018-03-23 NOTE — Telephone Encounter (Signed)
Next appt scheduled  6/27 with PCP.

## 2018-03-24 NOTE — Telephone Encounter (Signed)
Refill approved.

## 2018-04-04 ENCOUNTER — Other Ambulatory Visit: Payer: Self-pay | Admitting: Internal Medicine

## 2018-04-04 NOTE — Telephone Encounter (Signed)
Refill approved.

## 2018-04-15 ENCOUNTER — Other Ambulatory Visit: Payer: Self-pay | Admitting: Internal Medicine

## 2018-04-15 DIAGNOSIS — I10 Essential (primary) hypertension: Secondary | ICD-10-CM

## 2018-04-20 ENCOUNTER — Other Ambulatory Visit: Payer: Self-pay

## 2018-04-20 ENCOUNTER — Other Ambulatory Visit: Payer: Self-pay | Admitting: Internal Medicine

## 2018-04-20 ENCOUNTER — Ambulatory Visit (INDEPENDENT_AMBULATORY_CARE_PROVIDER_SITE_OTHER): Payer: Medicare HMO | Admitting: Internal Medicine

## 2018-04-20 ENCOUNTER — Encounter: Payer: Self-pay | Admitting: Internal Medicine

## 2018-04-20 VITALS — BP 137/78 | HR 90 | Temp 98.3°F | Ht 63.0 in | Wt 145.1 lb

## 2018-04-20 DIAGNOSIS — Z79899 Other long term (current) drug therapy: Secondary | ICD-10-CM

## 2018-04-20 DIAGNOSIS — R2232 Localized swelling, mass and lump, left upper limb: Secondary | ICD-10-CM

## 2018-04-20 DIAGNOSIS — H9193 Unspecified hearing loss, bilateral: Secondary | ICD-10-CM | POA: Diagnosis not present

## 2018-04-20 DIAGNOSIS — G35 Multiple sclerosis: Secondary | ICD-10-CM | POA: Diagnosis not present

## 2018-04-20 DIAGNOSIS — G43909 Migraine, unspecified, not intractable, without status migrainosus: Secondary | ICD-10-CM

## 2018-04-20 DIAGNOSIS — G2581 Restless legs syndrome: Secondary | ICD-10-CM

## 2018-04-20 DIAGNOSIS — Z0000 Encounter for general adult medical examination without abnormal findings: Secondary | ICD-10-CM | POA: Insufficient documentation

## 2018-04-20 DIAGNOSIS — G44209 Tension-type headache, unspecified, not intractable: Secondary | ICD-10-CM | POA: Diagnosis not present

## 2018-04-20 DIAGNOSIS — I10 Essential (primary) hypertension: Secondary | ICD-10-CM | POA: Diagnosis not present

## 2018-04-20 DIAGNOSIS — R239 Unspecified skin changes: Secondary | ICD-10-CM

## 2018-04-20 DIAGNOSIS — Z1159 Encounter for screening for other viral diseases: Secondary | ICD-10-CM

## 2018-04-20 DIAGNOSIS — G43709 Chronic migraine without aura, not intractable, without status migrainosus: Secondary | ICD-10-CM

## 2018-04-20 DIAGNOSIS — Z23 Encounter for immunization: Secondary | ICD-10-CM | POA: Diagnosis not present

## 2018-04-20 DIAGNOSIS — L814 Other melanin hyperpigmentation: Secondary | ICD-10-CM

## 2018-04-20 DIAGNOSIS — M6790 Unspecified disorder of synovium and tendon, unspecified site: Secondary | ICD-10-CM

## 2018-04-20 DIAGNOSIS — Z114 Encounter for screening for human immunodeficiency virus [HIV]: Secondary | ICD-10-CM

## 2018-04-20 DIAGNOSIS — F17210 Nicotine dependence, cigarettes, uncomplicated: Secondary | ICD-10-CM

## 2018-04-20 NOTE — Progress Notes (Signed)
   CC: HTN, Migraine, MS, RLS, Tobacco Use, Hearing loss, hand nodule, skin change, Preventative medicine  HPI:  [name] is a 57 y.o. F with PMHx listed below presenting for HTN, Migraine, MS, RLS, Tobacco Use, Hearing loss, hand nodule, skin change, Preventative medicine. Please see the A&P for the status of the patient's chronic medical problems.   Past Medical History:  Diagnosis Date  . Depression   . Deviated nasal septum   . Hearing loss 06/11/2014   Bilateral  . Herniated cervical disc   . Migraine   . Miscarriage    2  . MS (multiple sclerosis) ([place])    Review of Systems:  Performed and all others negative.  Physical Exam:  Vitals:   04/20/18 1525  BP: 137/78  Pulse: 90  Temp: 98.3 F (36.8 C)  SpO2: 100%  Weight: 145 lb 1.6 oz (65.8 kg)  Height: 5\' 3"  (1.6 m)   Physical Exam  Constitutional: She appears well-developed and well-nourished. No distress.  Cardiovascular: Normal rate, regular rhythm, normal heart sounds and intact distal pulses.  Pulmonary/Chest: Effort normal and breath sounds normal. No respiratory distress.  Abdominal: Soft. Bowel sounds are normal. She exhibits no distension. There is no tenderness.  Musculoskeletal: She exhibits no edema.  66mm nodule on the palmer surface of her L hand over her 4th MIP at the location of the tendon, tender to direct palpation  Skin: Skin is warm and dry.  tan macule with irregular borders at right temple    Assessment & Plan:   See Encounters Tab for problem based charting.  Patient discussed with Dr. Angelia Mould

## 2018-04-20 NOTE — Assessment & Plan Note (Signed)
Patient screened for HIV and Hepatitis C Patient received TDAP Vaccine Patient provided with Heme occult card for colon cancer screening

## 2018-04-20 NOTE — Assessment & Plan Note (Signed)
Patient continue to follow with neurology for Migraine and tension headaches. She states headaches remained well controlled with PRN medications - Continue to follow up with neurology

## 2018-04-20 NOTE — Assessment & Plan Note (Signed)
Patient symptoms remain well controlled with  per cabidopa-levodopa neurology -Follow up with neurology - Continue cabidopa-levodopa

## 2018-04-20 NOTE — Assessment & Plan Note (Signed)
BP remains well controlled today at 137/78. Will continue current regimen. - Continue Lisinopril 5mg  Daily

## 2018-04-20 NOTE — Assessment & Plan Note (Signed)
Patient continues to smoke about half a pack a day. She has plans to begin using nicotine patches, which she has already purchased. She will be working on cutting back along with her boyfriend - Encouraged her efforts at smoking cessation

## 2018-04-20 NOTE — Assessment & Plan Note (Signed)
Patient has a history of MS and is followed by neurology. She has a neurology appointment soon. She remains on Tecfidera. - Follow up with neurology - Continue Tecfidera

## 2018-04-20 NOTE — Assessment & Plan Note (Signed)
Patient reports chronic hearing loss that she is interested in having worked up so she can look into getting hearing aides. Will provide referral to audiology for further testing. - Ambulatory referral to Audiology

## 2018-04-20 NOTE — Patient Instructions (Addendum)
Thank you for allowing [place] to care for you  For your MS, Migraines, and Restless leg - Follow up with neurology - Continue to take current medications  We are screening for Hepatitis C and HIV today, you will be contacted with the results  You received the TDAP vaccine today  You have been given a Card for colon cancer screening  Continue to work on smoking cession, good job so far!  We will continue to monitor the nodule on your hand, let us know if your symptoms worsen  Please follow up in 6 months or sooner if needed

## 2018-04-21 ENCOUNTER — Encounter: Payer: Self-pay | Admitting: Internal Medicine

## 2018-04-21 DIAGNOSIS — M6790 Unspecified disorder of synovium and tendon, unspecified site: Secondary | ICD-10-CM | POA: Insufficient documentation

## 2018-04-21 DIAGNOSIS — L814 Other melanin hyperpigmentation: Secondary | ICD-10-CM | POA: Insufficient documentation

## 2018-04-21 LAB — HIV ANTIBODY (ROUTINE TESTING W REFLEX): HIV Screen 4th Generation wRfx: NONREACTIVE

## 2018-04-21 LAB — HEPATITIS C ANTIBODY: Hep C Virus Ab: <0.1 s/co ratio (ref 0.0–0.9)

## 2018-04-21 NOTE — Progress Notes (Signed)
Letter sent to patient informing her of her negative Hep C and HIV screens.  Pearson Grippe, DO IM PGY-1

## 2018-04-21 NOTE — Assessment & Plan Note (Signed)
Patient states she has noted new tan spot on her right temple. She has previous similar spots. The lesion is a tan macule with irregular borders in a sun exposed area. Suspect solar lentigo. Patient instructed to monitor for raised edges or other significant transformations of skin lesions.  - Continue to monitor

## 2018-04-21 NOTE — Assessment & Plan Note (Signed)
Patient states she has had a nodule on the palmer surface of her left hand for about 5-6 months. The nodule has been stable in size and is typically not painful. She denies any instances of her finger being caught in a flexed position.   On exam patient has a firm, 99mm nodule on the palmer surface of her L hand over her 4th MIP at the location of the tendon. Suspect tendon nodule, will monitor for symptoms of trigger finger. - Will continue to monitor for now as nodule has not caused patient significant paint or functional limitations.

## 2018-04-24 NOTE — Progress Notes (Signed)
Internal Medicine Clinic Attending  Case discussed with Dr. Melvin  at the time of the visit.  We reviewed the resident's history and exam and pertinent patient test results.  I agree with the assessment, diagnosis, and plan of care documented in the resident's note.  

## 2018-05-02 DIAGNOSIS — W19XXXA Unspecified fall, initial encounter: Secondary | ICD-10-CM | POA: Diagnosis not present

## 2018-05-02 DIAGNOSIS — M79602 Pain in left arm: Secondary | ICD-10-CM | POA: Diagnosis not present

## 2018-05-11 ENCOUNTER — Other Ambulatory Visit: Payer: Self-pay | Admitting: Neurology

## 2018-05-11 ENCOUNTER — Other Ambulatory Visit: Payer: Self-pay | Admitting: Internal Medicine

## 2018-05-11 DIAGNOSIS — H903 Sensorineural hearing loss, bilateral: Secondary | ICD-10-CM | POA: Diagnosis not present

## 2018-05-11 DIAGNOSIS — I10 Essential (primary) hypertension: Secondary | ICD-10-CM

## 2018-05-11 DIAGNOSIS — H6981 Other specified disorders of Eustachian tube, right ear: Secondary | ICD-10-CM | POA: Diagnosis not present

## 2018-05-11 DIAGNOSIS — H6521 Chronic serous otitis media, right ear: Secondary | ICD-10-CM | POA: Diagnosis not present

## 2018-05-11 DIAGNOSIS — H90A31 Mixed conductive and sensorineural hearing loss, unilateral, right ear with restricted hearing on the contralateral side: Secondary | ICD-10-CM | POA: Diagnosis not present

## 2018-05-14 ENCOUNTER — Other Ambulatory Visit: Payer: Self-pay | Admitting: Internal Medicine

## 2018-05-14 DIAGNOSIS — I10 Essential (primary) hypertension: Secondary | ICD-10-CM

## 2018-05-17 ENCOUNTER — Ambulatory Visit: Payer: Medicare HMO | Admitting: Neurology

## 2018-05-18 ENCOUNTER — Encounter: Payer: Self-pay | Admitting: Neurology

## 2018-05-18 ENCOUNTER — Telehealth: Payer: Self-pay | Admitting: Neurology

## 2018-05-18 ENCOUNTER — Other Ambulatory Visit: Payer: Self-pay | Admitting: Internal Medicine

## 2018-05-18 MED ORDER — CARBIDOPA-LEVODOPA 10-100 MG PO TABS: 1.0000 | ORAL_TABLET | Freq: Every day | ORAL | 1 refills | Status: DC

## 2018-05-18 NOTE — Addendum Note (Signed)
Addended by: France Ravens I on: 05/18/2018 02:40 PM   Modules accepted: Orders

## 2018-05-18 NOTE — Telephone Encounter (Signed)
Refill for diclofenac tab approved

## 2018-05-18 NOTE — Telephone Encounter (Signed)
Pt requesting a refill for carbidopa-levodopa (SINEMET IR) 10-100 MG tablet sent to [facility]

## 2018-05-18 NOTE — Telephone Encounter (Signed)
Pt. noshowed her appt. with [facility] yesterday.  Rx. escribed to [facility] as requested with note that pt. must be seen prior to future refills/fim

## 2018-06-01 DIAGNOSIS — H6981 Other specified disorders of Eustachian tube, right ear: Secondary | ICD-10-CM | POA: Diagnosis not present

## 2018-06-01 DIAGNOSIS — F1290 Cannabis use, unspecified, uncomplicated: Secondary | ICD-10-CM | POA: Diagnosis not present

## 2018-06-01 DIAGNOSIS — H903 Sensorineural hearing loss, bilateral: Secondary | ICD-10-CM | POA: Diagnosis not present

## 2018-06-01 DIAGNOSIS — H9313 Tinnitus, bilateral: Secondary | ICD-10-CM | POA: Diagnosis not present

## 2018-06-01 DIAGNOSIS — G35 Multiple sclerosis: Secondary | ICD-10-CM | POA: Diagnosis not present

## 2018-06-01 DIAGNOSIS — Z7289 Other problems related to lifestyle: Secondary | ICD-10-CM | POA: Diagnosis not present

## 2018-06-01 DIAGNOSIS — F17200 Nicotine dependence, unspecified, uncomplicated: Secondary | ICD-10-CM | POA: Diagnosis not present

## 2018-06-04 ENCOUNTER — Other Ambulatory Visit: Payer: Self-pay | Admitting: Internal Medicine

## 2018-06-09 ENCOUNTER — Other Ambulatory Visit: Payer: Self-pay | Admitting: Internal Medicine

## 2018-06-09 DIAGNOSIS — I10 Essential (primary) hypertension: Secondary | ICD-10-CM

## 2018-06-09 NOTE — Telephone Encounter (Signed)
Refill aprroved 

## 2018-06-11 ENCOUNTER — Other Ambulatory Visit: Payer: Self-pay | Admitting: Internal Medicine

## 2018-06-13 NOTE — Telephone Encounter (Signed)
Refill approved.

## 2018-06-14 ENCOUNTER — Other Ambulatory Visit: Payer: Self-pay | Admitting: Internal Medicine

## 2018-06-14 ENCOUNTER — Telehealth: Payer: Self-pay | Admitting: Neurology

## 2018-06-14 DIAGNOSIS — F339 Major depressive disorder, recurrent, unspecified: Secondary | ICD-10-CM

## 2018-06-14 MED ORDER — PAROXETINE HCL 20 MG PO TABS: 20.0000 mg | ORAL_TABLET | Freq: Every day | ORAL | 3 refills | Status: DC

## 2018-06-14 NOTE — Telephone Encounter (Signed)
Needs refill PARoxetine (PAXIL) 20 MG tablet @ [facility] on [address]; pt contact# [phone]

## 2018-06-14 NOTE — Telephone Encounter (Signed)
Spoke with Debbie Watts.  Dr. Felecia Shelling has never rx'd Paxil for her.  It appears that this is rx'd by her pcp, Dr. Trilby Drummer, and the last rx. I see was given 02/03/18, just a 30 day supply, so pt. should have been out of this medication for several months. I referred her back to her pcp for refills/fim

## 2018-06-14 NOTE — Telephone Encounter (Signed)
Pt requesting refills for PARoxetine (PAXIL) 20 MG tablet sent to [facility]

## 2018-06-14 NOTE — Telephone Encounter (Signed)
Refill approved.

## 2018-07-04 ENCOUNTER — Ambulatory Visit: Payer: Medicare HMO | Admitting: Neurology

## 2018-07-20 ENCOUNTER — Ambulatory Visit (INDEPENDENT_AMBULATORY_CARE_PROVIDER_SITE_OTHER): Payer: Medicare HMO | Admitting: Internal Medicine

## 2018-07-20 ENCOUNTER — Encounter (INDEPENDENT_AMBULATORY_CARE_PROVIDER_SITE_OTHER): Payer: Self-pay

## 2018-07-20 ENCOUNTER — Encounter: Payer: Self-pay | Admitting: Internal Medicine

## 2018-07-20 ENCOUNTER — Other Ambulatory Visit: Payer: Self-pay

## 2018-07-20 VITALS — BP 119/79 | HR 76 | Temp 98.1°F | Ht 62.0 in | Wt 140.1 lb

## 2018-07-20 DIAGNOSIS — R252 Cramp and spasm: Secondary | ICD-10-CM | POA: Diagnosis not present

## 2018-07-20 DIAGNOSIS — M546 Pain in thoracic spine: Secondary | ICD-10-CM | POA: Insufficient documentation

## 2018-07-20 DIAGNOSIS — I10 Essential (primary) hypertension: Secondary | ICD-10-CM

## 2018-07-20 DIAGNOSIS — R2232 Localized swelling, mass and lump, left upper limb: Secondary | ICD-10-CM | POA: Diagnosis not present

## 2018-07-20 DIAGNOSIS — Z79899 Other long term (current) drug therapy: Secondary | ICD-10-CM | POA: Diagnosis not present

## 2018-07-20 DIAGNOSIS — F17210 Nicotine dependence, cigarettes, uncomplicated: Secondary | ICD-10-CM

## 2018-07-20 DIAGNOSIS — F339 Major depressive disorder, recurrent, unspecified: Secondary | ICD-10-CM

## 2018-07-20 DIAGNOSIS — M6790 Unspecified disorder of synovium and tendon, unspecified site: Secondary | ICD-10-CM

## 2018-07-20 DIAGNOSIS — G8929 Other chronic pain: Secondary | ICD-10-CM | POA: Diagnosis not present

## 2018-07-20 DIAGNOSIS — Z23 Encounter for immunization: Secondary | ICD-10-CM

## 2018-07-20 NOTE — Assessment & Plan Note (Signed)
Patient reports mild chronic intermittent back pain. Pain is located of the L thoracic paraspinal musculature. She states that the pain comes and goes and does not current affect her ability to function. Pain is controlled with current home medications (tramadol, flexeril, or OTC pain relievers) - Continue home medications - Return to clinic if pain fails to improve or worsens

## 2018-07-20 NOTE — Patient Instructions (Addendum)
Thank you for allowing [place] to care for you  Blood pressure remains well controlled today - Continue current medications  Continue to work on quitting smoking  Continue home medications for chronic back pain and leg cramps - If these worsen or fail to improve, you can schedule an acute care visit  Please follow up in about 6 months for a Medicare Wellness Visit

## 2018-07-20 NOTE — Assessment & Plan Note (Addendum)
Patient has history of depression well controlled on Paxil, this was recently refilled. Symptoms remain well controlled. PHQ-9 was 10 today. - Continue Paxil 20mg  Daily

## 2018-07-20 NOTE — Assessment & Plan Note (Signed)
Nodule remains stable and without significant pain or limitation of ROM. Will continue to monitor. Ddx includes tendon nodule and dupuytren's contracture. - Continue to monitor

## 2018-07-20 NOTE — Assessment & Plan Note (Signed)
BP remains well controlled at 119/79 today. Will continue current regimen. - Lisinopril 5mg  Daily

## 2018-07-20 NOTE — Assessment & Plan Note (Addendum)
Patient reports she continue to smoke about 1/2 to 3/4 a pack per day. She had been intending to quit using nicotine patches along with her boyfriend, But he is less willing to quit and having him smoking in the house has made it difficult for her. He is unable to easily smoke outside as he is a double amputee. - Encouraged patient to continue her efforts and to encourage her boyfriend to quit - Continue to monitor

## 2018-07-20 NOTE — Progress Notes (Signed)
   CC: Hypertension, Tobacco use, Depression, Tendon nodule, Left side thoracic back pain.  HPI:  Debbie Watts is a 57 y.o. F with PMHx listed below presenting for Hypertension, Tobacco use, Depression, Tendon nodule, Left side thoracic back pain. Please see the A&P for the status of the patient's chronic medical problems.   Past Medical History:  Diagnosis Date  . Depression   . Deviated nasal septum   . Hearing loss 06/11/2014   Bilateral  . Herniated cervical disc   . Migraine   . Miscarriage    2  . MS (multiple sclerosis) ([place])    Review of Systems:  Performed and all others negative.  Physical Exam:  Vitals:   07/20/18 1426  BP: 119/79  Pulse: 76  Temp: 98.1 F (36.7 C)  TempSrc: Oral  SpO2: 99%  Weight: 140 lb 1.6 oz (63.5 kg)  Height: 5\' 2"  (1.575 m)   Physical Exam  Constitutional: She appears well-developed and well-nourished. No distress.  Cardiovascular: Normal rate, regular rhythm, normal heart sounds and intact distal pulses.  Pulmonary/Chest: Effort normal and breath sounds normal. No respiratory distress.  Abdominal: Soft. Bowel sounds are normal. She exhibits no distension. There is no tenderness.  Musculoskeletal: She exhibits no edema, tenderness or deformity.  Tender to palpation of L paraspinal thoracic musculature 61mm nodule/contracture on the palmer surface of her L hand over her 4th MIP at the location of the tendon  Skin: Skin is warm and dry.    Assessment & Plan:   See Encounters Tab for problem based charting.  Patient discussed with Dr. Beryle Beams

## 2018-07-21 NOTE — Progress Notes (Signed)
Medicine attending: Medical history, presenting problems, physical findings, and medications, reviewed with resident physician Dr Alexander Melvin on the day of the patient visit and I concur with his evaluation and management plan. 

## 2018-07-28 ENCOUNTER — Other Ambulatory Visit: Payer: Self-pay | Admitting: Neurology

## 2018-07-28 DIAGNOSIS — G35 Multiple sclerosis: Secondary | ICD-10-CM

## 2018-08-07 ENCOUNTER — Telehealth: Payer: Self-pay | Admitting: *Deleted

## 2018-08-07 ENCOUNTER — Other Ambulatory Visit: Payer: Self-pay | Admitting: Internal Medicine

## 2018-08-07 MED ORDER — DIMETHYL FUMARATE 240 MG PO CPDR: 240.0000 mg | DELAYED_RELEASE_CAPSULE | Freq: Two times a day (BID) | ORAL | 0 refills | Status: DC

## 2018-08-07 NOTE — Telephone Encounter (Signed)
Refill approved.

## 2018-08-07 NOTE — Telephone Encounter (Signed)
Pt. last seen in March 2019. Has pending appt. with [facility] 09/19/18. [facility] escribed to [facility] in response to faxed request from them/fim

## 2018-08-09 ENCOUNTER — Ambulatory Visit: Payer: Medicare HMO | Admitting: Internal Medicine

## 2018-08-09 ENCOUNTER — Other Ambulatory Visit: Payer: Self-pay | Admitting: Neurology

## 2018-08-09 ENCOUNTER — Encounter: Payer: Self-pay | Admitting: Internal Medicine

## 2018-08-10 ENCOUNTER — Other Ambulatory Visit: Payer: Self-pay

## 2018-08-10 ENCOUNTER — Encounter: Payer: Self-pay | Admitting: Internal Medicine

## 2018-08-10 ENCOUNTER — Ambulatory Visit (INDEPENDENT_AMBULATORY_CARE_PROVIDER_SITE_OTHER): Payer: Medicare HMO | Admitting: Internal Medicine

## 2018-08-10 VITALS — BP 146/85 | HR 75 | Temp 98.1°F | Ht 62.0 in | Wt 144.0 lb

## 2018-08-10 DIAGNOSIS — Z9181 History of falling: Secondary | ICD-10-CM | POA: Diagnosis not present

## 2018-08-10 DIAGNOSIS — N390 Urinary tract infection, site not specified: Secondary | ICD-10-CM

## 2018-08-10 DIAGNOSIS — Z0000 Encounter for general adult medical examination without abnormal findings: Secondary | ICD-10-CM | POA: Diagnosis not present

## 2018-08-10 DIAGNOSIS — Z1239 Encounter for other screening for malignant neoplasm of breast: Secondary | ICD-10-CM

## 2018-08-10 HISTORY — DX: Urinary tract infection, site not specified: N39.0

## 2018-08-10 LAB — POCT URINALYSIS DIPSTICK
BILIRUBIN UA: NEGATIVE
GLUCOSE UA: NEGATIVE
Ketones, UA: NEGATIVE
NITRITE UA: POSITIVE
Protein, UA: NEGATIVE
SPEC GRAV UA: 1.015 (ref 1.010–1.025)
Urobilinogen, UA: 0.2 E.U./dL
pH, UA: 7 (ref 5.0–8.0)

## 2018-08-10 MED ORDER — SULFAMETHOXAZOLE-TRIMETHOPRIM 800-160 MG PO TABS: 1.0000 | ORAL_TABLET | Freq: Two times a day (BID) | ORAL | 0 refills | Status: AC
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
## 2018-08-10 NOTE — Patient Instructions (Addendum)
Annual Wellness Visit   Medicare Covered Preventative Screenings and Services  Services & Screenings Men and Women Who How Often Need? Date of Last Service Action  Abdominal Aortic Aneurysm Adults with AAA risk factors Once     Alcohol Misuse and Counseling All Adults Screening once a year if no alcohol misuse. Counseling up to 4 face to face sessions.     Bone Density Measurement  Adults at risk for osteoporosis Once every 2 yrs     Lipid Panel Z13.6 All adults without CV disease Once every 5 yrs     Colorectal Cancer   Stool sample or  Colonoscopy All adults 20 and older   Once every year  Every 10 years     Depression All Adults Once a year  Today   Diabetes Screening Blood glucose, post glucose load, or GTT Z13.1  All adults at risk  Pre-diabetics  Once per year  Twice per year     Diabetes  Self-Management Training All adults Diabetics 10 hrs first year; 2 hours subsequent years. Requires Copay     Glaucoma  Diabetics  Family history of glaucoma  African Americans 24 yrs +  Hispanic Americans 40 yrs + Annually - requires coppay     Hepatitis C Z72.89 or F19.20  High Risk for HCV  Born between 1945 and 1965  Annually  Once     HIV Z11.4 All adults based on risk  Annually btw ages 61 & 92 regardless of risk  Annually > 65 yrs if at increased risk     Lung Cancer Screening Asymptomatic adults aged 82-77 with 30 pack yr history and current smoker OR quit within the last 15 yrs Annually Must have counseling and shared decision making documentation before first screen     Medical Nutrition Therapy Adults with   Diabetes  Renal disease  Kidney transplant within past 3 yrs 3 hours first year; 2 hours subsequent years     Obesity and Counseling All adults Screening once a year Counseling if BMI 30 or higher  Today   Tobacco Use Counseling Adults who use tobacco  Up to 8 visits in one year     Vaccines Z23  Hepatitis B  Influenza   Pneumonia  Adults    Once  Once every flu season  Two different vaccines separated by one year     Next Annual Wellness Visit People with Medicare Every year  Today     Services & Screenings Women Who How Often Need  Date of Last Service Action  Mammogram  Z12.31 Women over 20 One baseline ages 59-39. Annually ager 66 yrs+ X    Pap tests All women Annually if high risk. Every 2 yrs for normal risk women     Screening for cervical cancer with   Pap (Z01.419 nl or Z01.411abnl) &  HPV Z11.51 Women aged 87 to 51 Once every 5 yrs     Screening pelvic and breast exams All women Annually if high risk. Every 2 yrs for normal risk women     Sexually Transmitted Diseases  Chlamydia  Gonorrhea  Syphilis All at risk adults Annually for non pregnant females at increased risk         [place] Men Who How Ofter Need  Date of Last Service Action  Prostate Cancer - DRE & PSA Men over 50 Annually.  DRE might require a copay.     Sexually Transmitted Diseases  Syphilis All at risk adults Annually  for men at increased risk         Things That May Be Affecting Your Health:  Alcohol  Hearing loss  Pain   X Depression  Home Safety  Sexual Health   Diabetes  Lack of physical activity  Stress   Difficulty with daily activities  Loneliness  Tiredness   Drug use X Medicines X Tobacco use   Falls  Motor Vehicle Safety  Weight   Food choices  Oral Health  Other    YOUR PERSONALIZED HEALTH PLAN : 1. Schedule your next subsequent Medicare Wellness visit in one year 2. Attend all of your regular appointments to address your medical issues 3. Complete the preventative screenings and services 4. Get mammogram 5. Call 1-800 QUIT-NOW when ready to stop smoking    Fall Prevention in the Home Falls can cause injuries. They can happen to people of all ages. There are many things you can do to make your home safe and to help prevent falls. What can I do on the outside of my home?  Regularly fix  the edges of walkways and driveways and fix any cracks.  Remove anything that might make you trip as you walk through a door, such as a raised step or threshold.  Trim any bushes or trees on the path to your home.  Use bright outdoor lighting.  Clear any walking paths of anything that might make someone trip, such as rocks or tools.  Regularly check to see if handrails are loose or broken. Make sure that both sides of any steps have handrails.  Any raised decks and porches should have guardrails on the edges.  Have any leaves, snow, or ice cleared regularly.  Use sand or salt on walking paths during winter.  Clean up any spills in your garage right away. This includes oil or grease spills. What can I do in the bathroom?  Use night lights.  Install grab bars by the toilet and in the tub and shower. Do not use towel bars as grab bars.  Use non-skid mats or decals in the tub or shower.  If you need to sit down in the shower, use a plastic, non-slip stool.  Keep the floor dry. Clean up any water that spills on the floor as soon as it happens.  Remove soap buildup in the tub or shower regularly.  Attach bath mats securely with double-sided non-slip rug tape.  Do not have throw rugs and other things on the floor that can make you trip. What can I do in the bedroom?  Use night lights.  Make sure that you have a light by your bed that is easy to reach.  Do not use any sheets or blankets that are too big for your bed. They should not hang down onto the floor.  Have a firm chair that has side arms. You can use this for support while you get dressed.  Do not have throw rugs and other things on the floor that can make you trip. What can I do in the kitchen?  Clean up any spills right away.  Avoid walking on wet floors.  Keep items that you use a lot in easy-to-reach places.  If you need to reach something above you, use a strong step stool that has a grab bar.  Keep  electrical cords out of the way.  Do not use floor polish or wax that makes floors slippery. If you must use wax, use non-skid floor wax.  Do  not have throw rugs and other things on the floor that can make you trip. What can I do with my stairs?  Do not leave any items on the stairs.  Make sure that there are handrails on both sides of the stairs and use them. Fix handrails that are broken or loose. Make sure that handrails are as long as the stairways.  Check any carpeting to make sure that it is firmly attached to the stairs. Fix any carpet that is loose or worn.  Avoid having throw rugs at the top or bottom of the stairs. If you do have throw rugs, attach them to the floor with carpet tape.  Make sure that you have a light switch at the top of the stairs and the bottom of the stairs. If you do not have them, ask someone to add them for you. What else can I do to help prevent falls?  Wear shoes that: ? Do not have high heels. ? Have rubber bottoms. ? Are comfortable and fit you well. ? Are closed at the toe. Do not wear sandals.  If you use a stepladder: ? Make sure that it is fully opened. Do not climb a closed stepladder. ? Make sure that both sides of the stepladder are locked into place. ? Ask someone to hold it for you, if possible.  Clearly mark and make sure that you can see: ? Any grab bars or handrails. ? First and last steps. ? Where the edge of each step is.  Use tools that help you move around (mobility aids) if they are needed. These include: ? Canes. ? Walkers. ? Scooters. ? Crutches.  Turn on the lights when you go into a dark area. Replace any light bulbs as soon as they burn out.  Set up your furniture so you have a clear path. Avoid moving your furniture around.  If any of your floors are uneven, fix them.  If there are any pets around you, be aware of where they are.  Review your medicines with your doctor. Some medicines can make you feel dizzy.  This can increase your chance of falling. Ask your doctor what other things that you can do to help prevent falls. This information is not intended to replace advice given to you by your health care provider. Make sure you discuss any questions you have with your health care provider. Document Released: 08/07/2009 Document Revised: 03/18/2016 Document Reviewed: 11/15/2014 Elsevier Interactive Patient Education  2018 [employer].   Steps to Quit Smoking Smoking tobacco can be bad for your health. It can also affect almost every organ in your body. Smoking puts you and people around you at risk for many serious long-lasting (chronic) diseases. Quitting smoking is hard, but it is one of the best things that you can do for your health. It is never too late to quit. What are the benefits of quitting smoking? When you quit smoking, you lower your risk for getting serious diseases and conditions. They can include:  Lung cancer or lung disease.  Heart disease.  Stroke.  Heart attack.  Not being able to have children (infertility).  Weak bones (osteoporosis) and broken bones (fractures).  If you have coughing, wheezing, and shortness of breath, those symptoms may get better when you quit. You may also get sick less often. If you are pregnant, quitting smoking can help to lower your chances of having a baby of low birth weight. What can I do to help me quit  smoking? Talk with your doctor about what can help you quit smoking. Some things you can do (strategies) include:  Quitting smoking totally, instead of slowly cutting back how much you smoke over a period of time.  Going to in-person counseling. You are more likely to quit if you go to many counseling sessions.  Using resources and support systems, such as: ? Database administrator with a Social worker. ? Phone quitlines. ? Careers information officer. ? Support groups or group counseling. ? Text messaging programs. ? Mobile phone apps or  applications.  Taking medicines. Some of these medicines may have nicotine in them. If you are pregnant or breastfeeding, do not take any medicines to quit smoking unless your doctor says it is okay. Talk with your doctor about counseling or other things that can help you.  Talk with your doctor about using more than one strategy at the same time, such as taking medicines while you are also going to in-person counseling. This can help make quitting easier. What things can I do to make it easier to quit? Quitting smoking might feel very hard at first, but there is a lot that you can do to make it easier. Take these steps:  Talk to your family and friends. Ask them to support and encourage you.  Call phone quitlines, reach out to support groups, or work with a Social worker.  Ask people who smoke to not smoke around you.  Avoid places that make you want (trigger) to smoke, such as: ? Bars. ? Parties. ? Smoke-break areas at work.  Spend time with people who do not smoke.  Lower the stress in your life. Stress can make you want to smoke. Try these things to help your stress: ? Getting regular exercise. ? Deep-breathing exercises. ? Yoga. ? Meditating. ? Doing a body scan. To do this, close your eyes, focus on one area of your body at a time from head to toe, and notice which parts of your body are tense. Try to relax the muscles in those areas.  Download or buy apps on your mobile phone or tablet that can help you stick to your quit plan. There are many free apps, such as QuitGuide from the [employer] Office manager for Disease Control and Prevention). You can find more support from smokefree.gov and other websites.  This information is not intended to replace advice given to you by your health care provider. Make sure you discuss any questions you have with your health care provider. Document Released: 08/07/2009 Document Revised: 06/08/2016 Document Reviewed: 02/25/2015 Elsevier Interactive Patient  Education  2018 [employer].

## 2018-08-13 ENCOUNTER — Encounter: Payer: Self-pay | Admitting: Internal Medicine

## 2018-08-13 NOTE — Assessment & Plan Note (Signed)
During the course of wellness visit, patient report symptoms of increased urinary frequency, urgency, odor, and tingling with urination. Urine dipstick showed nitrites, leukocytes, and trace blood. Will treat with empiric Bactrim based on previous cultures from May. - Bactrim DS BID x 3 days

## 2018-08-15 NOTE — Progress Notes (Signed)
Internal Medicine Clinic Attending  Case discussed with Dr. Melvin  at the time of the visit.  We reviewed the resident's history and exam and pertinent patient test results.  I agree with the assessment, diagnosis, and plan of care documented in the resident's note.  

## 2018-08-20 ENCOUNTER — Other Ambulatory Visit: Payer: Self-pay | Admitting: Neurology

## 2018-08-20 DIAGNOSIS — G35 Multiple sclerosis: Secondary | ICD-10-CM

## 2018-08-26 ENCOUNTER — Other Ambulatory Visit: Payer: Self-pay | Admitting: Neurology

## 2018-09-06 ENCOUNTER — Other Ambulatory Visit: Payer: Self-pay | Admitting: Neurology

## 2018-09-19 ENCOUNTER — Encounter: Payer: Self-pay | Admitting: Neurology

## 2018-09-19 ENCOUNTER — Ambulatory Visit: Payer: Medicare HMO | Admitting: Neurology

## 2018-09-19 ENCOUNTER — Other Ambulatory Visit: Payer: Self-pay

## 2018-09-19 VITALS — BP 121/80 | HR 89 | Ht 62.0 in | Wt 141.5 lb

## 2018-09-19 DIAGNOSIS — F339 Major depressive disorder, recurrent, unspecified: Secondary | ICD-10-CM | POA: Diagnosis not present

## 2018-09-19 DIAGNOSIS — G35 Multiple sclerosis: Secondary | ICD-10-CM | POA: Diagnosis not present

## 2018-09-19 DIAGNOSIS — G2581 Restless legs syndrome: Secondary | ICD-10-CM

## 2018-09-19 DIAGNOSIS — R269 Unspecified abnormalities of gait and mobility: Secondary | ICD-10-CM | POA: Diagnosis not present

## 2018-09-19 DIAGNOSIS — M5126 Other intervertebral disc displacement, lumbar region: Secondary | ICD-10-CM

## 2018-09-19 DIAGNOSIS — M48062 Spinal stenosis, lumbar region with neurogenic claudication: Secondary | ICD-10-CM | POA: Insufficient documentation

## 2018-09-19 DIAGNOSIS — G8929 Other chronic pain: Secondary | ICD-10-CM | POA: Insufficient documentation

## 2018-09-19 NOTE — Progress Notes (Signed)
[facility]  PATIENT: Latora Quarry DOB: Jan 16, 1961    HISTORICAL  CHIEF COMPLAINT:  Chief Complaint  Patient presents with  . Follow-up    RM 13, alone. Last seen 01/12/18. Does not need refills on any meds.  . Multiple Sclerosis    On Tecfidera. Doing well, no new sx.     HISTORY OF PRESENT ILLNESS:  Reta Norgren is a 57 y.o. woman with multiple sclerosis.  Update 09/19/2018: She feels she is mostly stable.   She denies exacerbations or new symptoms.   She is on Tecfidera and tolerates it well.      She feels gait is doing well and she could easily walk a mile.   She had one fall.   She has LBP if she stands or walk a while.  She  Also has RLS ans is helped by Sinemet at night.  She has urinary frequency but stopped oxybutynin due to frequent UTI's.  Sometimes she wakes up with headaches and takes Tylenol or Excedrin Migraine.   These occur 4-5 times a month.      She feels fatigue occurs almost every day, more in the afternoons.      She sleeps well most nights.    She has some depression though Paxil has helped.       01/12/2018: She is currently on Tecfidera.  Before that she was on a study drug (ALKS 8700) that was very similar to take daily.  She feels her MS is doing well.  She has not had any exacerbations.  She is tolerating the medication well with no GI side effects or flushing.  She is walking ok and could walk 20 minutes or about a mile if she had to.     However, her back hurts if she stands a long time or walks longer.    She feels a little weaker on her right side and she has numbness in her right hand.    She has noted more urinary urgency the past year.    She wakes up 2 -3 times nightly for nocturia.  Other times, she notes hesitancy and sometimes does not completely empty.   She has no recent UTI.    Vision is stable.   She wearss glasses and has not had ON or diplopia in the past.      She has fatigue.    She has some sleep onset insomnia.    She takes Sinemet and gabeontin for RLS with benefit.  She wakes up to use the bathroom and sometimes has trouble falling back asleep.    She has some stress with her boyfriend needing a foot amputation (DM, gangrene).   She sometimes has mild depression but not enough to consider a medication.   Cognitively, she is doing well.     MS history: She was diagnosed with MS in 1999 based on MRI and lumbar rupture.  At that time she was living in [place] and was seeing Dr. Tilden Fossa.  When he retired she saw another doctor and then she moved to this area around 2015 but did not seek neurologic referral until 2015.  She was on Avonex for many years but stopped due to insurance.  Then, in November 2016, she started in the drug study and was on ALKS 8700 4 2 years.  The study ended in late 2018 for her and she switched over to [place].   Her last MRI 08/09/2017 showed stable MS lesions.  REVIEW OF SYSTEMS: Constitutional: No fevers, chills, sweats, or change in appetite.  She has fatigue.  She sometimes has insomnia. Eyes: No visual changes, double vision, eye pain Ear, nose and throat: No hearing loss, ear pain, nasal congestion, sore throat Cardiovascular: No chest pain, palpitations Respiratory: No shortness of breath at rest or with exertion.   No wheezes GastrointestinaI: No nausea, vomiting, diarrhea, abdominal pain, fecal incontinence Genitourinary:She has urinary frequency and nocturia . Musculoskeletal: No neck pain, back pain Integumentary: No rash, pruritus, skin lesions Neurological: as above Psychiatric: No depression at this time.  No anxiety Endocrine: No palpitations, diaphoresis, change in appetite, change in weigh or increased thirst Hematologic/Lymphatic: No anemia, purpura, petechiae. Allergic/Immunologic: No itchy/runny eyes, nasal congestion, recent allergic reactions, rashes  ALLERGIES: Allergies  Allergen Reactions  . Procaine Shortness Of Breath    HOME  MEDICATIONS:  Current Outpatient Medications:  .  Acetaminophen-Caffeine (TENSION HEADACHE RELIEF PO), Take by mouth., Disp: , Rfl:  .  betamethasone valerate lotion (VALISONE) 0.1 %, APPLY 1 APPLICATION TOPICALLY 2 TIMES DAILY., Disp: , Rfl:  .  carbidopa-levodopa (SINEMET IR) 10-100 MG tablet, Take 1 tablet by mouth at bedtime. MUST BE SEEN PRIOR TO FUTURE REFILLS, Disp: 30 tablet, Rfl: 1 .  cyclobenzaprine (FLEXERIL) 10 MG tablet, TAKE 1 TABLET BY MOUTH EVERYDAY AT BEDTIME, Disp: 30 tablet, Rfl: 1 .  diclofenac (VOLTAREN) 75 MG EC tablet, TAKE 1 TABLET BY MOUTH TWICE A DAY, Disp: 60 tablet, Rfl: 5 .  Dimethyl Fumarate 240 MG CPDR, Take 1 capsule (240 mg total) by mouth 2 (two) times daily., Disp: 180 capsule, Rfl: 0 .  eletriptan (RELPAX) 40 MG tablet, Take 40 mg by mouth as needed for migraine or headache. One tablet by mouth at onset of headache. May repeat in 2 hours if headache persists or recurs., Disp: , Rfl:  .  ferrous sulfate 325 (65 FE) MG tablet, Take 325 mg by mouth daily., Disp: , Rfl:  .  gabapentin (NEURONTIN) 300 MG capsule, TAKE 2 CAPSULES (600 MG TOTAL) BY MOUTH 2 (TWO) TIMES DAILY., Disp: 360 capsule, Rfl: 0 .  lisinopril (PRINIVIL,ZESTRIL) 5 MG tablet, TAKE 1 TABLET BY MOUTH EVERY DAY, Disp: 90 tablet, Rfl: 3 .  PARoxetine (PAXIL) 20 MG tablet, Take 1 tablet (20 mg total) by mouth daily., Disp: 90 tablet, Rfl: 3 .  Tiotropium Bromide Monohydrate 1.25 MCG/ACT AERS, Inhale into the lungs., Disp: , Rfl:  .  traMADol (ULTRAM) 50 MG tablet, TAKE 1 TABLET (50 MG TOTAL) BY MOUTH 2 (TWO) TIMES DAILY AS NEEDED., Disp: 60 tablet, Rfl: 0 .  VENTOLIN HFA 108 (90 Base) MCG/ACT inhaler, , Disp: , Rfl:   PAST MEDICAL HISTORY: Past Medical History:  Diagnosis Date  . Depression   . Deviated nasal septum   . Hearing loss 06/11/2014   Bilateral  . Herniated cervical disc   . Hypertension   . Migraine   . Miscarriage    2  . MS (multiple sclerosis) ([place])     PAST SURGICAL  HISTORY: Past Surgical History:  Procedure Laterality Date  . NASAL SEPTUM SURGERY    . spinal injections      FAMILY HISTORY: Family History  Adopted: Yes  Problem Relation Age of Onset  . Cancer Maternal Uncle        throat    SOCIAL HISTORY:  Social History   Socioeconomic History  . Marital status: Single    Spouse name: Not on file  . Number of children: 2  .  Years of education: 38  . Highest education level: Not on file  Occupational History  . Occupation: Disabled  Social Needs  . Financial resource strain: Not on file  . Food insecurity:    Worry: Not on file    Inability: Not on file  . Transportation needs:    Medical: Not on file    Non-medical: Not on file  Tobacco Use  . Smoking status: Current Every Day Smoker    Packs/day: 0.50    Years: 44.00    Pack years: 22.00    Types: Cigarettes  . Smokeless tobacco: Never Used  . Tobacco comment: 1/2  to 3/4 pack per day   Substance and Sexual Activity  . Alcohol use: Yes    Comment: 0-5 beers daily  . Drug use: Yes    Types: Marijuana  . Sexual activity: Not Currently    Partners: Male  Lifestyle  . Physical activity:    Days per week: Not on file    Minutes per session: Not on file  . Stress: Not on file  Relationships  . Social connections:    Talks on phone: Not on file    Gets together: Not on file    Attends religious service: Not on file    Active member of club or organization: Not on file    Attends meetings of clubs or organizations: Not on file    Relationship status: Not on file  . Intimate partner violence:    Fear of current or ex partner: Not on file    Emotionally abused: Not on file    Physically abused: Not on file    Forced sexual activity: Not on file  Other Topics Concern  . Not on file  Social History Narrative   Patient is single with 2 children.   Patient is right handed.   Current Social History 08/10/2018        Right handed       Patient lives with  significant other [name]) in one level Townhome 08/10/2018    Transportation: Patient has own vehicle  08/10/2018   Important Relationships [name] 08/10/2018    Pets: one dog named Browser 08/10/2018   Education / Work:  12th grade/ Disabled 08/10/2018   Interests / Fun: Watching baseball and Nascar 08/10/2018   Current Stressors: Significant other is depressed over his poor health 08/10/2018   Religious / Personal Beliefs: "I believe in God" 08/10/2018   Other: "I had a miscarriage before I had my 2 sons." 08/10/2018   L. Ducatte, RN, BSN                                                                                                   PHYSICAL EXAM  Vitals:   09/19/18 1043  BP: 121/80  Pulse: 89  SpO2: 98%  Weight: 141 lb 8 oz (64.2 kg)  Height: 5\' 2"  (1.575 m)    Body mass index is 25.88 kg/m.   General: The patient is well-developed and well-nourished and in no acute distress   Neurologic Exam  Mental status:  The patient is alert and oriented x 3 at the time of the examination. The patient has apparent normal recent and remote memory, with an apparently normal attention span and concentration ability.   Speech is normal.  Cranial nerves: Extraocular movements are full.  Facial strength and sensation was normal.  The trapezius strength was normal bilaterally.  The tongue is midline, and the patient has symmetric elevation of the soft palate. No obvious hearing deficits are noted.  Motor:  Muscle bulk is normal.   Tone is normal. Strength is  5 / 5 in all 4 extremities.   Sensory: She had slight asymmetry of sensation, reduced on the left to touch and vibration  Coordination: Cerebellar testing shows good finger-nose-finger.  Heel-to-shin is mildly reduced bilaterally.  Gait and station: Station is normal.   The gait is mildly wide.  Tandem gait is very wide.  Romberg is negative.  Reflexes: Deep tendon reflexes are symmetric and normal in arms, increased in  legs but no clonus at ankles.        DIAGNOSTIC DATA (LABS, IMAGING, TESTING) - I reviewed patient records, labs, notes, testing and imaging myself where available.  Lab Results  Component Value Date   WBC 6.5 08/31/2017   HGB 13.5 08/31/2017   HCT 40.2 08/31/2017   MCV 89 08/31/2017   PLT 316 08/31/2017      Component Value Date/Time   NA 140 08/31/2017 1524   K 4.6 08/31/2017 1524   CL 102 08/31/2017 1524   CO2 28 08/31/2017 1524   GLUCOSE 87 08/31/2017 1524   BUN 17 08/31/2017 1524   CREATININE 0.73 08/31/2017 1524   CALCIUM 8.8 08/31/2017 1524   PROT 6.4 08/31/2017 1524   ALBUMIN 4.3 08/31/2017 1524   AST 28 08/31/2017 1524   ALT 48 (H) 08/31/2017 1524   ALKPHOS 107 08/31/2017 1524   BILITOT <0.2 08/31/2017 1524   GFRNONAA 93 08/31/2017 1524   GFRAA 107 08/31/2017 1524   No results found for: CHOL, HDL, LDLCALC, LDLDIRECT, TRIG, CHOLHDL Lab Results  Component Value Date   HGBA1C 5.3 12/07/2017   Lab Results  Component Value Date   YNXGZFPO25 189 07/02/2015   No results found for: TSH     ASSESSMENT AND PLAN  Multiple sclerosis ([place]) - Plan: CBC with Differential/Platelet  Lumbar herniated disc - Plan: MR LUMBAR SPINE WO CONTRAST  Restless leg syndrome  Major depression, recurrent, chronic (HCC)  Gait disturbance  Neurogenic claudication due to lumbar spinal stenosis - Plan: MR LUMBAR SPINE WO CONTRAST   1.   Continue Tecfidera for MS. check a CBC with differential to determine if there is any severe lymphopenia..   2.    Continue other medications.  Tramadol, gabapentin and oxybutynin will be renewed. 3.   Her back pain and leg pain is worsrisome for spinal stenosis as she also appears to have neurogenic claudication.  We need to check an MRI of the lumbar spine and consider referral to surgery based on the results.      4.   Stay active and exercise as tolerated.     5.   Return in 6 months or sooner if there are new or worsening neurologic  symptoms.   Tyrion Glaude A. Felecia Shelling, MD, [facility] 84/21/0312, 81:18 PM Certified in Neurology, Clinical Neurophysiology, Sleep Medicine, Pain Medicine and Neuroimaging  [facility] [address], [place], [place] [address] [phone]

## 2018-09-20 ENCOUNTER — Telehealth: Payer: Self-pay | Admitting: *Deleted

## 2018-09-20 LAB — CBC WITH DIFFERENTIAL/PLATELET
BASOS ABS: 0.1 10*3/uL (ref 0.0–0.2)
Basos: 2 %
EOS (ABSOLUTE): 0.1 10*3/uL (ref 0.0–0.4)
Eos: 2 %
Hematocrit: 42 % (ref 34.0–46.6)
Hemoglobin: 14.3 g/dL (ref 11.1–15.9)
Immature Grans (Abs): 0 10*3/uL (ref 0.0–0.1)
Immature Granulocytes: 0 %
LYMPHS ABS: 0.8 10*3/uL (ref 0.7–3.1)
Lymphs: 16 %
MCH: 30.8 pg (ref 26.6–33.0)
MCHC: 34 g/dL (ref 31.5–35.7)
MCV: 90 fL (ref 79–97)
MONOCYTES: 10 %
MONOS ABS: 0.5 10*3/uL (ref 0.1–0.9)
Neutrophils Absolute: 3.6 10*3/uL (ref 1.4–7.0)
Neutrophils: 70 %
PLATELETS: 316 10*3/uL (ref 150–450)
RBC: 4.65 x10E6/uL (ref 3.77–5.28)
RDW: 12.9 % (ref 12.3–15.4)
WBC: 5.2 10*3/uL (ref 3.4–10.8)

## 2018-09-20 NOTE — Telephone Encounter (Signed)
-----   Message from Britt Bottom, MD sent at 09/20/2018  8:30 AM EST ----- Please let the patient know that the lab work is fine.

## 2018-09-20 NOTE — Telephone Encounter (Signed)
Called and spoke with patient about lab results per Dr. Felecia Shelling note. She verbalized understanding.

## 2018-09-23 ENCOUNTER — Other Ambulatory Visit: Payer: Self-pay | Admitting: Neurology

## 2018-09-25 ENCOUNTER — Telehealth: Payer: Self-pay | Admitting: Neurology

## 2018-09-25 NOTE — Telephone Encounter (Signed)
Mcarthur Rossetti Josem Kaufmann: [id number] (exp. 09/20/18 to 10/20/18) order sent to GI. They will reach out to the pt to schedule.

## 2018-10-03 ENCOUNTER — Ambulatory Visit
Admission: RE | Admit: 2018-10-03 | Discharge: 2018-10-03 | Disposition: A | Discharge status: Home / Self Care | Payer: Medicare HMO | Source: Ambulatory Visit | Attending: Neurology | Admitting: Neurology

## 2018-10-03 DIAGNOSIS — M48062 Spinal stenosis, lumbar region with neurogenic claudication: Secondary | ICD-10-CM

## 2018-10-03 DIAGNOSIS — M5136 Other intervertebral disc degeneration, lumbar region: Secondary | ICD-10-CM | POA: Diagnosis not present

## 2018-10-03 DIAGNOSIS — M5126 Other intervertebral disc displacement, lumbar region: Secondary | ICD-10-CM | POA: Diagnosis not present

## 2018-10-06 ENCOUNTER — Telehealth: Payer: Self-pay | Admitting: *Deleted

## 2018-10-06 ENCOUNTER — Other Ambulatory Visit: Payer: Self-pay | Admitting: *Deleted

## 2018-10-06 ENCOUNTER — Other Ambulatory Visit: Payer: Self-pay | Admitting: Neurology

## 2018-10-06 MED ORDER — TRAMADOL HCL 50 MG PO TABS: 50.0000 mg | ORAL_TABLET | Freq: Two times a day (BID) | ORAL | 0 refills | Status: DC | PRN

## 2018-10-06 NOTE — Telephone Encounter (Signed)
Spoke to patient to notify her of the MRI results.  She verbalized understanding.

## 2018-10-06 NOTE — Telephone Encounter (Signed)
-----   Message from Britt Bottom, MD sent at 10/06/2018 12:44 PM EST ----- Please let her know that the MRI of the lumbar spine does show some arthritis and disc degeneration but nothing is causing nerve root compression or spinal stenosis.  Nothing is bad enough to refer to surgery.

## 2018-11-04 ENCOUNTER — Other Ambulatory Visit: Payer: Self-pay | Admitting: Neurology

## 2018-11-04 ENCOUNTER — Other Ambulatory Visit: Payer: Self-pay | Admitting: Internal Medicine

## 2018-11-06 ENCOUNTER — Other Ambulatory Visit: Payer: Self-pay | Admitting: Neurology

## 2018-11-06 MED ORDER — DIMETHYL FUMARATE 240 MG PO CPDR: 240.0000 mg | DELAYED_RELEASE_CAPSULE | Freq: Two times a day (BID) | ORAL | 3 refills | Status: DC

## 2018-11-06 NOTE — Telephone Encounter (Signed)
Patient calling to get new prescription sent to [facility] for [place] fax number [phone] or [phone]. If need please call patient

## 2018-11-06 NOTE — Telephone Encounter (Signed)
E-scribed rx Tecfidera with refills to [facility] as requested by pt.

## 2018-11-06 NOTE — Addendum Note (Signed)
Addended by: Hope Pigeon on: 11/06/2018 04:02 PM   Modules accepted: Orders

## 2018-11-06 NOTE — Telephone Encounter (Signed)
Refill Approved

## 2018-11-08 ENCOUNTER — Ambulatory Visit
Admission: RE | Admit: 2018-11-08 | Discharge: 2018-11-08 | Disposition: A | Discharge status: Home / Self Care | Payer: Medicare HMO | Source: Ambulatory Visit | Attending: Internal Medicine | Admitting: Internal Medicine

## 2018-11-08 DIAGNOSIS — Z1231 Encounter for screening mammogram for malignant neoplasm of breast: Secondary | ICD-10-CM | POA: Diagnosis not present

## 2018-11-08 DIAGNOSIS — Z1239 Encounter for other screening for malignant neoplasm of breast: Secondary | ICD-10-CM

## 2018-11-08 IMAGING — MG DIGITAL SCREENING BILATERAL MAMMOGRAM WITH CAD
4 series · 4 of 4 positions shown · non-contrast
Comparison: Previous exam(s).

CLINICAL DATA: Screening.

EXAM:
DIGITAL SCREENING BILATERAL MAMMOGRAM WITH CAD

[R CC]
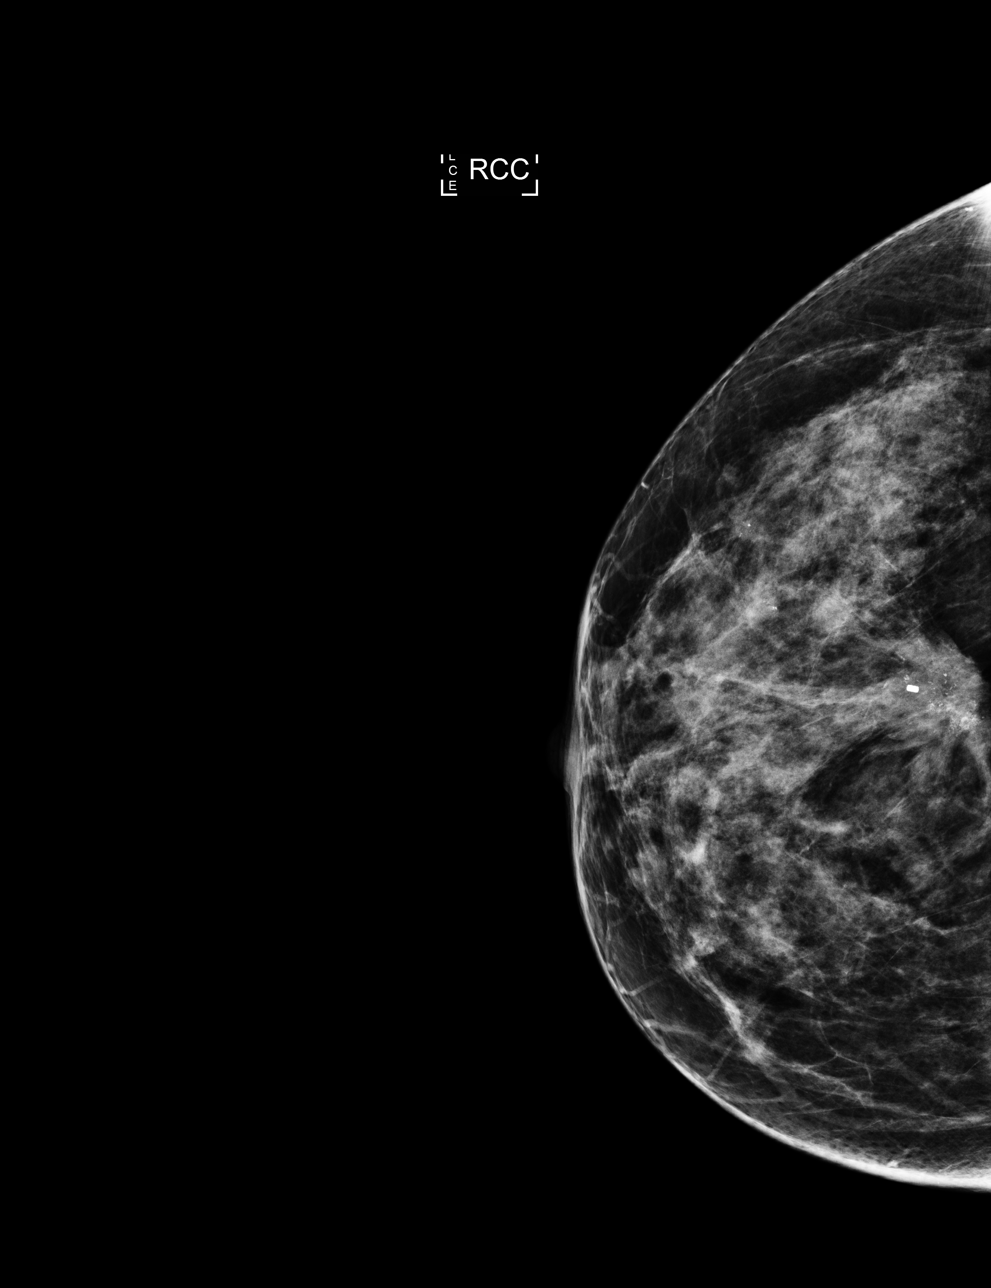

[R MLO]
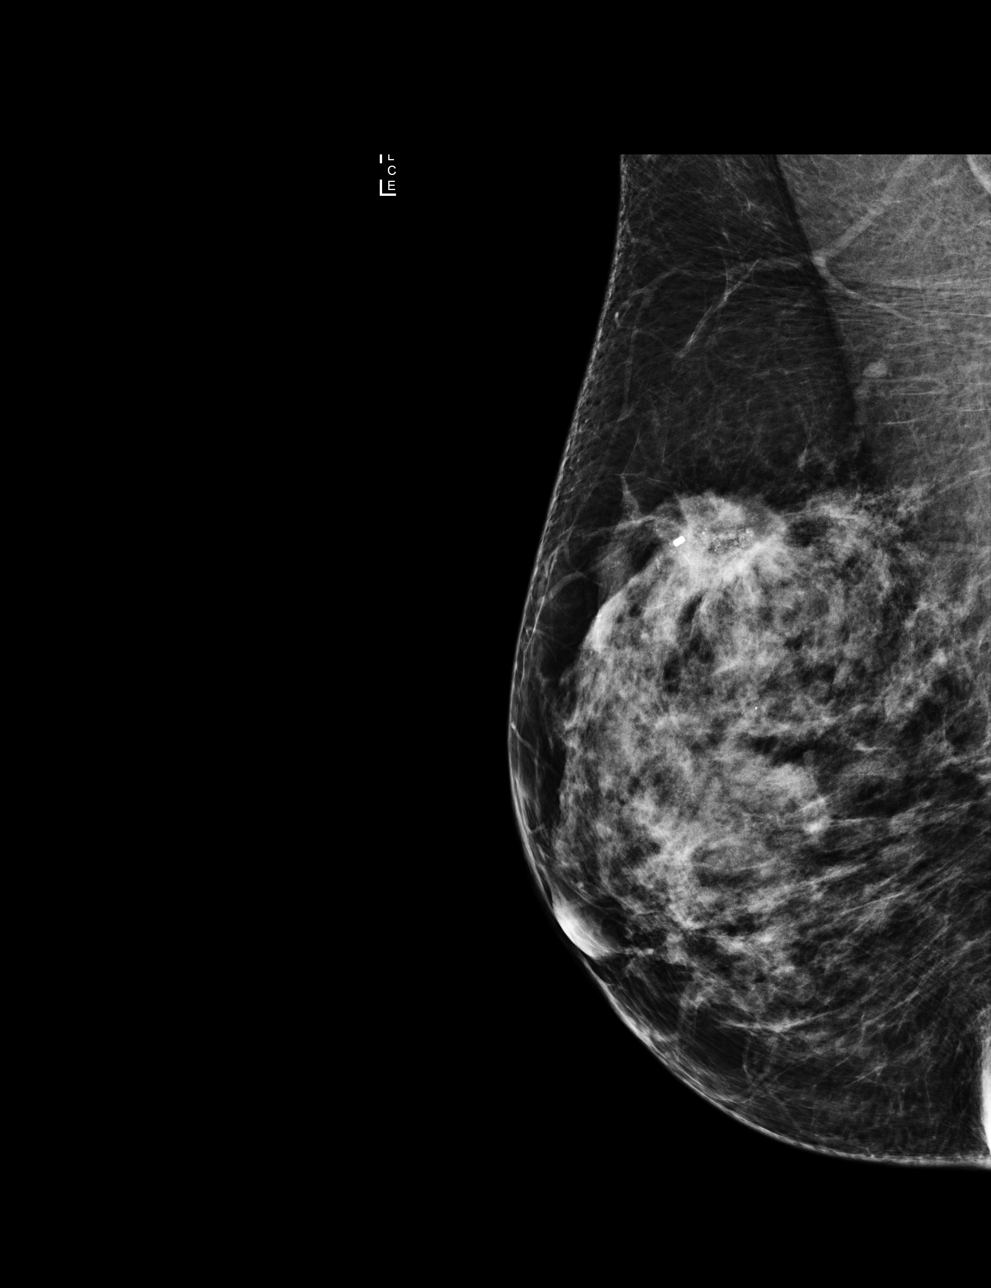

[L CC]
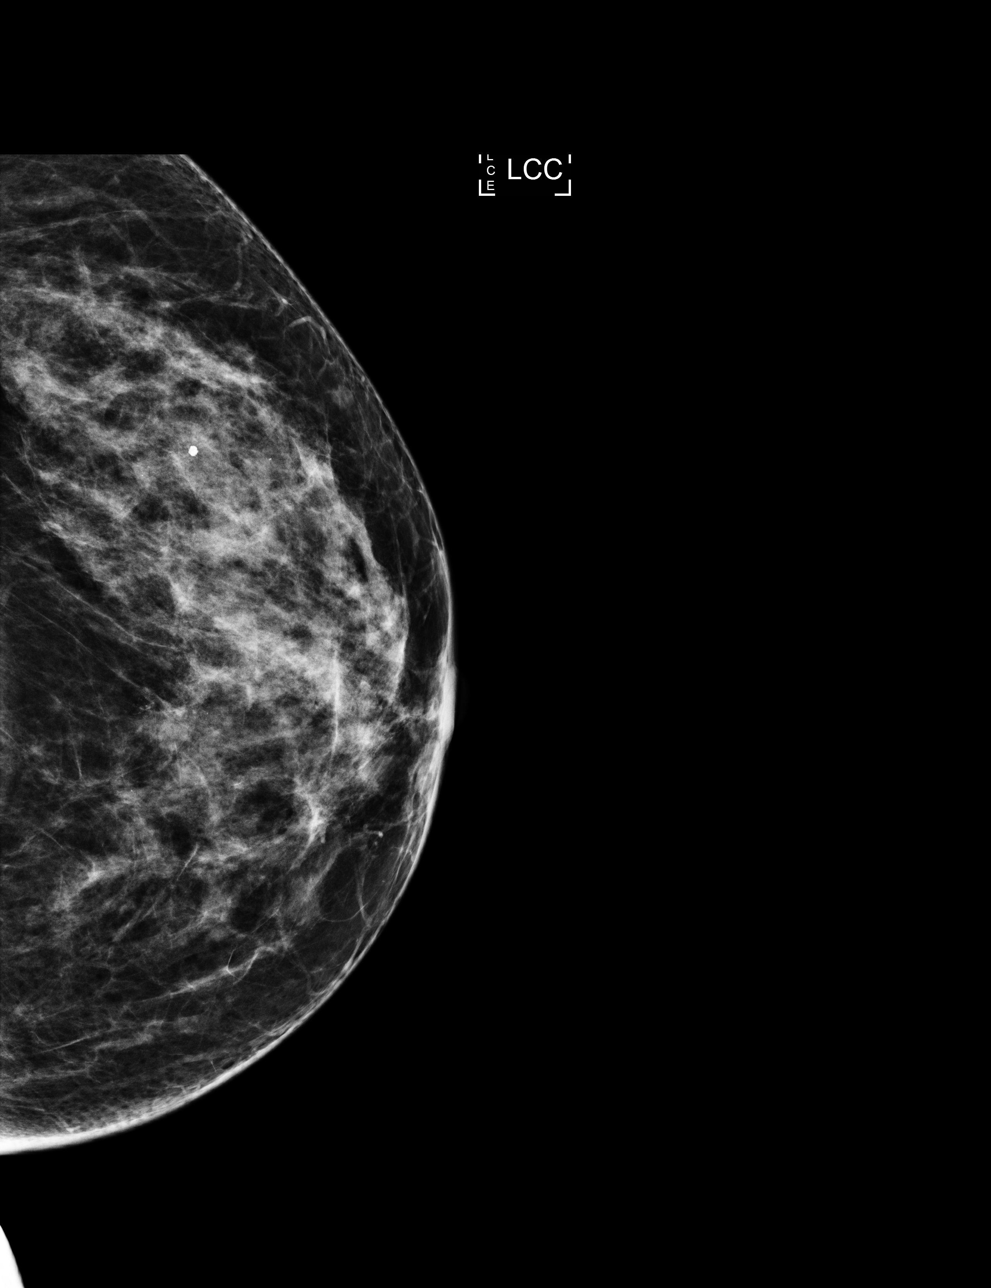

[L MLO]
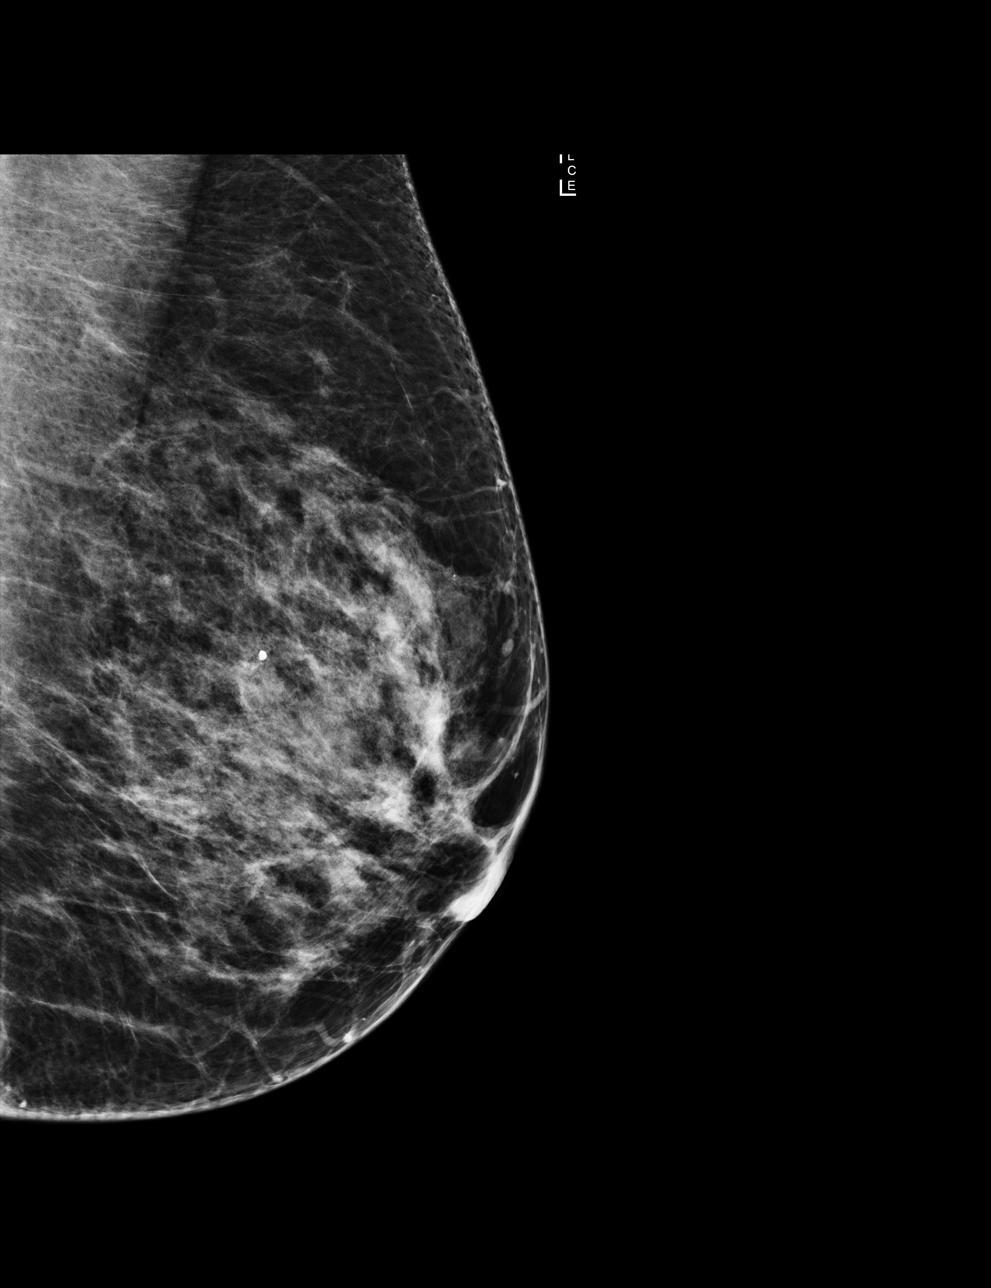

[4 of 4 positions shown; findings below may reference images not displayed]

ACR Breast Density Category c: The breast tissue is heterogeneously
dense, which may obscure small masses.
FINDINGS: In the right breast, a possible mass warrants further evaluation. In
the left breast, no findings suspicious for malignancy. Images were
processed with CAD.
IMPRESSION: Further evaluation is suggested for possible mass in the right
breast.

RECOMMENDATION:
Diagnostic mammogram and possibly ultrasound of the right breast.
(Code:[TN])

The patient will be contacted regarding the findings, and additional
imaging will be scheduled.

BI-RADS CATEGORY  0: Incomplete. Need additional imaging evaluation
and/or prior mammograms for comparison.

## 2018-11-10 ENCOUNTER — Other Ambulatory Visit: Payer: Self-pay | Admitting: Internal Medicine

## 2018-11-10 DIAGNOSIS — R928 Other abnormal and inconclusive findings on diagnostic imaging of breast: Secondary | ICD-10-CM

## 2018-11-17 ENCOUNTER — Other Ambulatory Visit: Payer: Self-pay | Admitting: Internal Medicine

## 2018-11-17 ENCOUNTER — Ambulatory Visit
Admission: RE | Admit: 2018-11-17 | Discharge: 2018-11-17 | Disposition: A | Discharge status: Home / Self Care | Payer: Medicare HMO | Source: Ambulatory Visit | Attending: Internal Medicine | Admitting: Internal Medicine

## 2018-11-17 DIAGNOSIS — N6002 Solitary cyst of left breast: Secondary | ICD-10-CM | POA: Diagnosis not present

## 2018-11-17 DIAGNOSIS — N6310 Unspecified lump in the right breast, unspecified quadrant: Secondary | ICD-10-CM

## 2018-11-17 DIAGNOSIS — R921 Mammographic calcification found on diagnostic imaging of breast: Secondary | ICD-10-CM

## 2018-11-17 DIAGNOSIS — N6311 Unspecified lump in the right breast, upper outer quadrant: Secondary | ICD-10-CM | POA: Diagnosis not present

## 2018-11-17 DIAGNOSIS — N6322 Unspecified lump in the left breast, upper inner quadrant: Secondary | ICD-10-CM | POA: Diagnosis not present

## 2018-11-17 DIAGNOSIS — R920 Mammographic microcalcification found on diagnostic imaging of breast: Secondary | ICD-10-CM | POA: Diagnosis not present

## 2018-11-17 DIAGNOSIS — R928 Other abnormal and inconclusive findings on diagnostic imaging of breast: Secondary | ICD-10-CM

## 2018-11-17 DIAGNOSIS — N6321 Unspecified lump in the left breast, upper outer quadrant: Secondary | ICD-10-CM | POA: Diagnosis not present

## 2018-11-17 DIAGNOSIS — R922 Inconclusive mammogram: Secondary | ICD-10-CM | POA: Diagnosis not present

## 2018-11-17 IMAGING — US ULTRASOUND RIGHT BREAST LIMITED
1 series · 12 of 25 positions shown · non-contrast
Comparison: [DATE] and earlier priors from an outside
facility performed in [UV] and [UV].

CLINICAL DATA: 57-year-old patient recalled from recent 2D
screening mammogram for evaluation of a possible right breast mass.

EXAM:
DIGITAL DIAGNOSTIC RIGHT MAMMOGRAM WITH CAD AND TOMO
ULTRASOUND RIGHT BREAST

[Series 1: ultrasound right breast limited · 0.07mm/px · 12 of 27 slices shown]
[im 2/27]
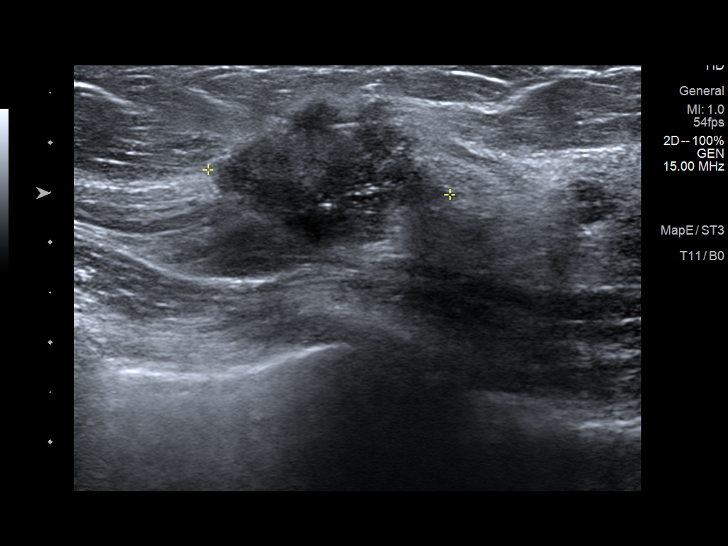
[im 4/27]
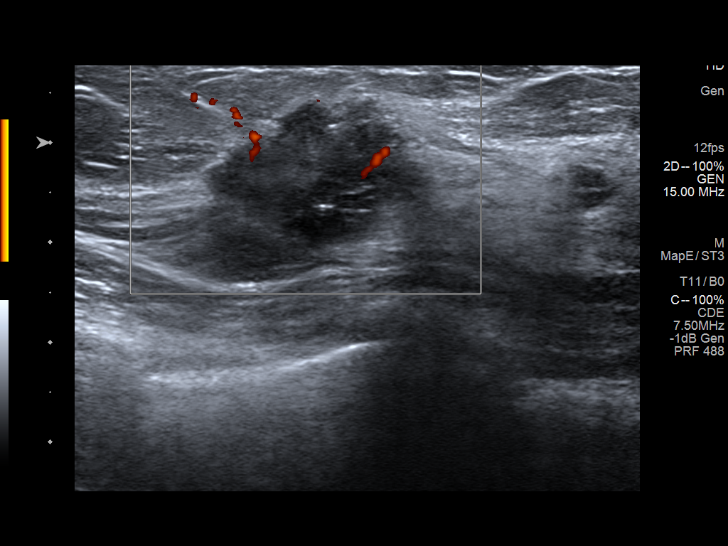
[im 6/27]
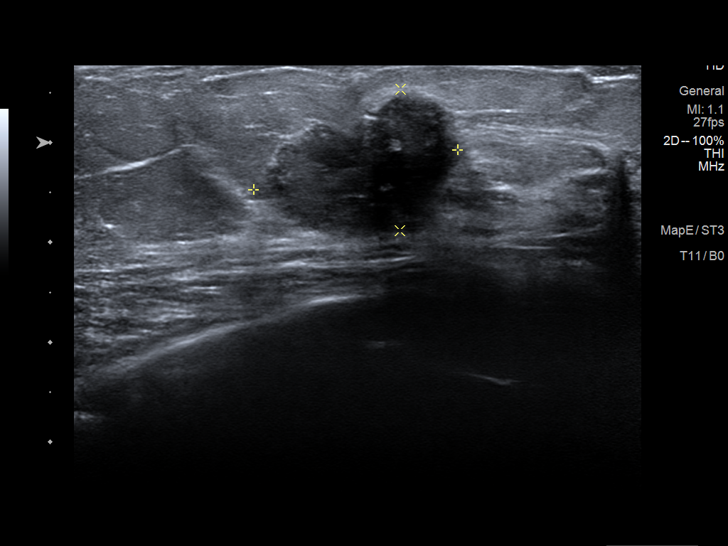
[im 8/27]
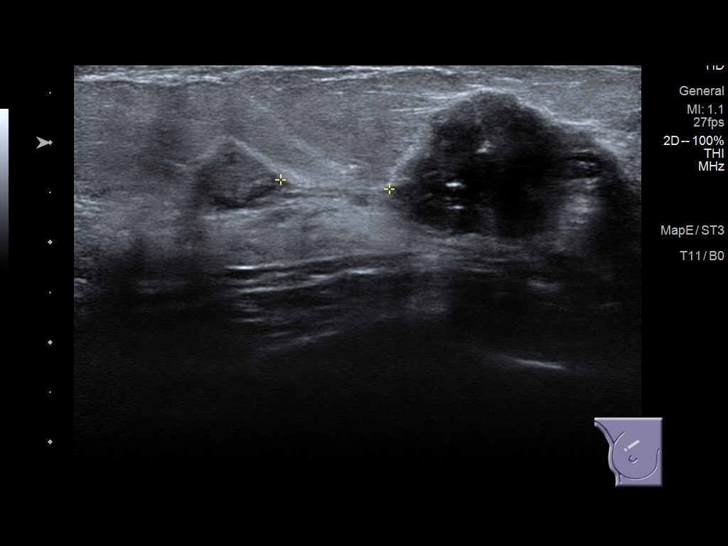
[im 10/27]
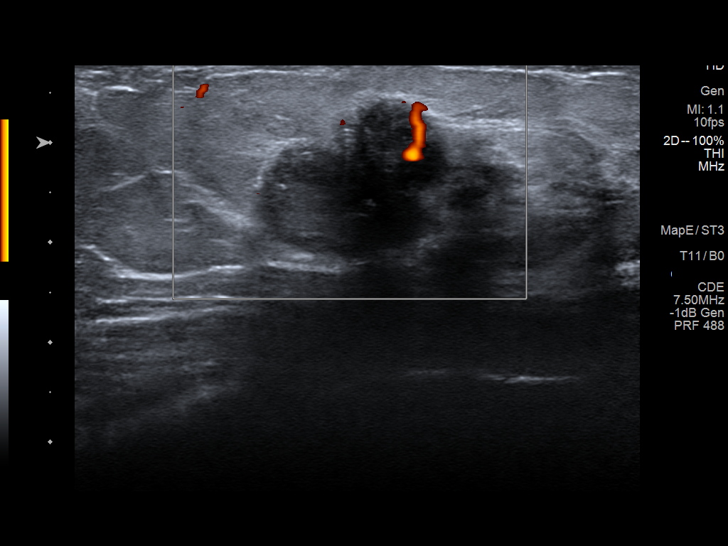
[im 12/27]
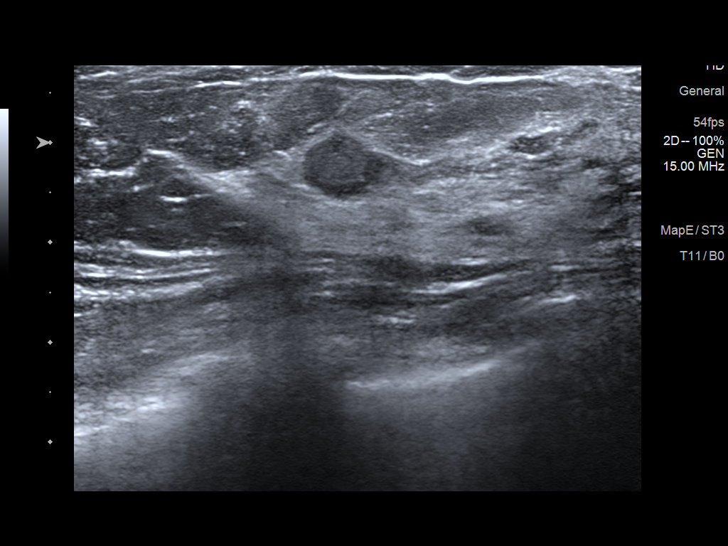
[im 15/27]
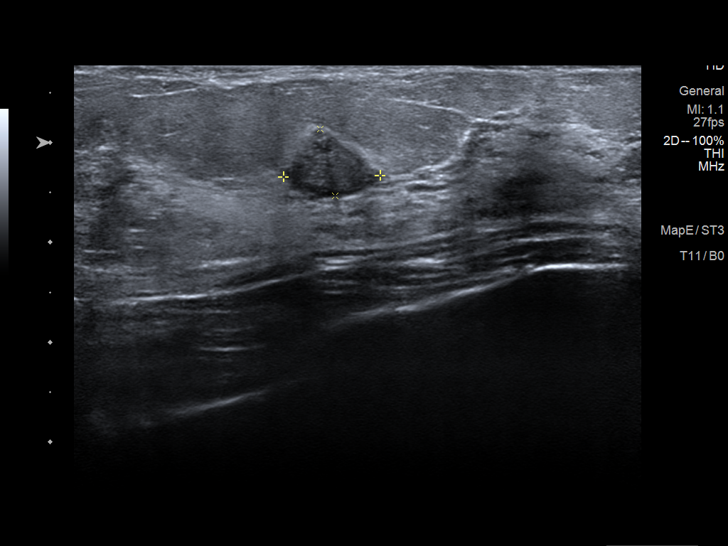
[im 17/27]
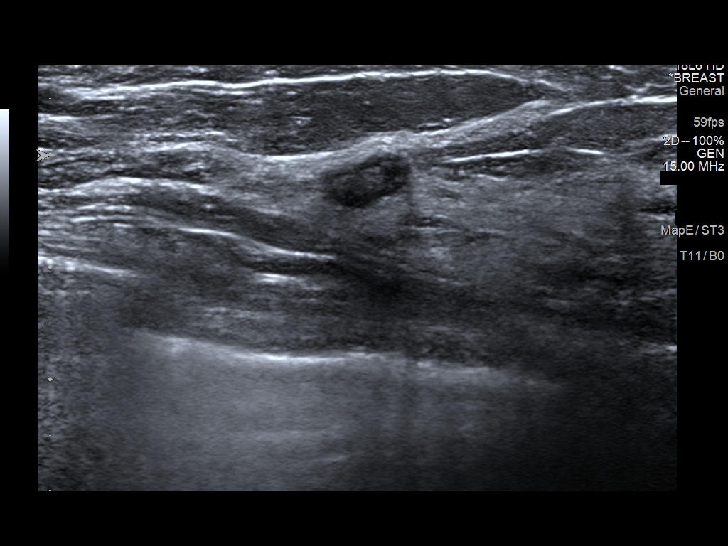
[im 19/27]
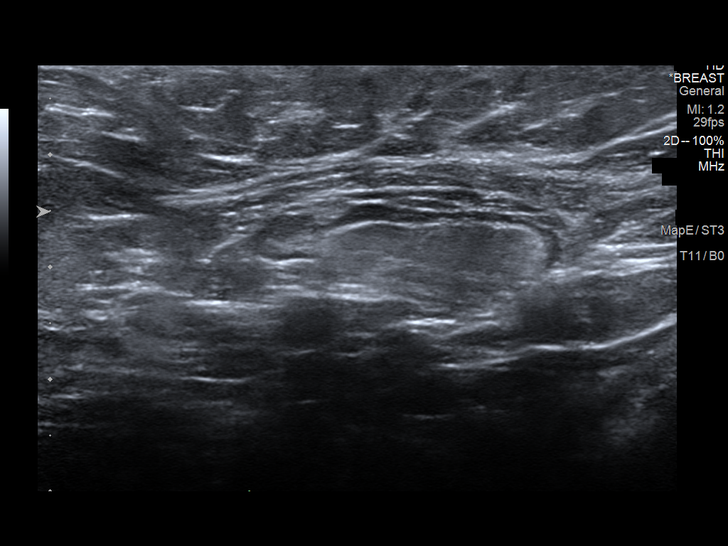
[im 21/27]
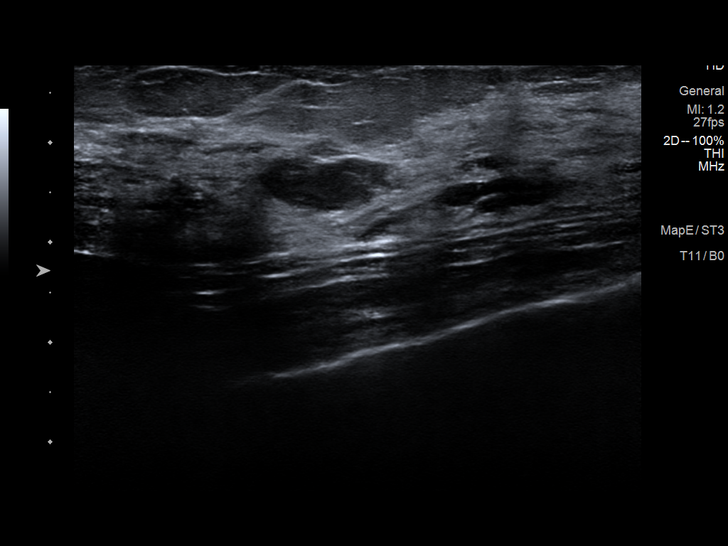
[im 23/27]
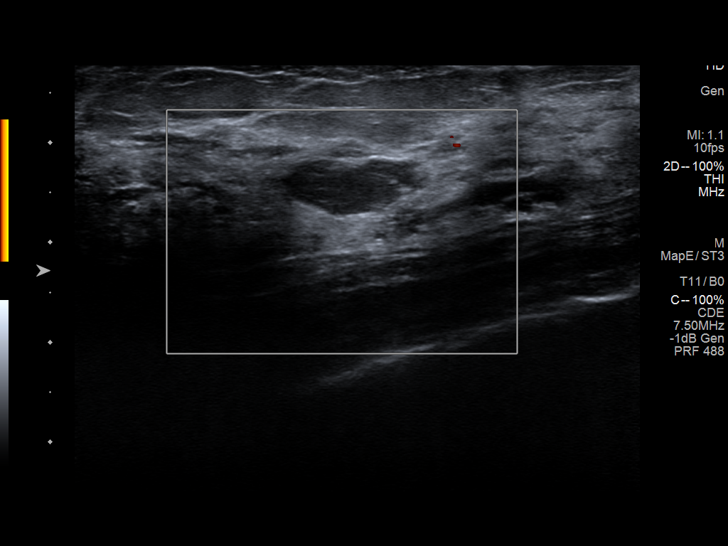
[im 25/27]
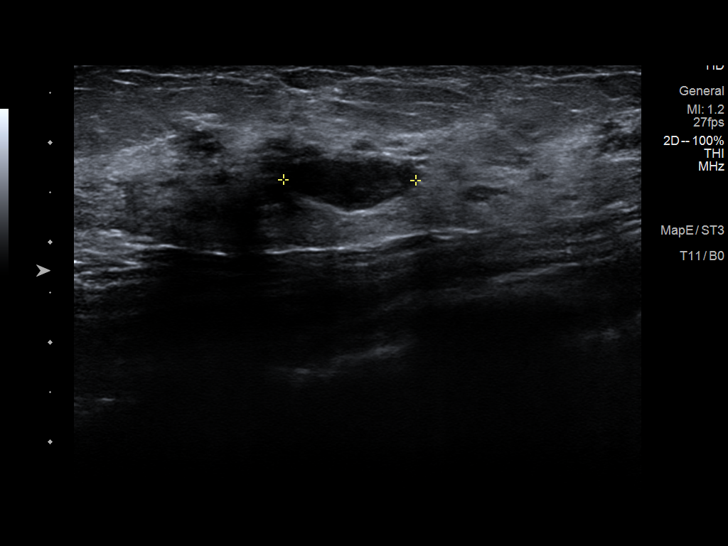

[12 of 25 positions shown; findings below may reference images not displayed]

ACR Breast Density Category c: The breast tissue is heterogeneously
dense, which may obscure small masses.
FINDINGS: Whole breast CC and MLO views with tomography confirm an irregular
and spiculated mass with multiple internal pleomorphic
calcifications. The mass is in the 11 o'clock position, mid to
posterior thirds, and measures approximately 2 cm greatest diameter.

In the anterior third of the upper outer right breast are 2 separate
small groups of heterogeneous microcalcifications evaluated with
magnification views. Both of these groups of calcifications span
approximately 3 mm.

Mammographic images were processed with CAD.

On physical exam, there is a firm palpable approximately 2 cm mass
at 12 o'clock position in the right breast 4 cm from the nipple.

Targeted ultrasound is performed, showing an irregular hypoechoic
mass with angulated margins and internal vascular flow at 12 o'clock
position approximately 4 cm from the nipple that measures 2.0 x
x 2.9 cm. Echogenicities within the mass are consistent with
calcifications seen at mammography.

Approximately 1 cm from this dominant mass, in the 11 o'clock
position 4 to 5 cm from the nipple is a slightly hypoechoic
irregular mass measuring 1.0 x 0.7 x 0.8 cm. There is internal
vascular flow.

At [DATE] position is an intramammary lymph node measuring 0.8 cm.

At [DATE] position 3 cm from the nipple is a parallel circumscribed
oval hypoechoic mass with posterior acoustic enhancement that
measures 1.3 x 0.5 x 1.3 cm and has no internal vascular flow. This
is favored to be a benign complicated cyst with some internal
septation.

Ultrasound of the right axilla is negative for lymphadenopathy.
IMPRESSION: 1. Suspicious palpable right breast mass at 12 o'clock position
measures 2.9 cm maximum diameter.
2. Suspicious 1.0 cm right breast mass at 11 o'clock position 4-5 cm
from the nipple.
3. Two small groups of microcalcifications in the upper-outer right
breast, separate from the mass. These are nonspecific, but ductal
carcinoma in situ can not be excluded.
4. Complicated cyst [DATE] position right breast.

RECOMMENDATION:
Two ultrasound-guided biopsies are recommended and have been
scheduled for the patient. These biopsies include the palpable mass
at 12 o'clock position and the non- palpable mass at 11 o'clock
position.

If either of these masses are malignant, then breast MRI is
suggested for evaluation of extent of disease, and to help determine
what additional biopsy(ies) may be warranted to evaluate for extent
of disease. If no additional suspicious areas are identified on
breast MRI, then 2 stereotactic biopsies, of the small groups of
calcifications, may be warranted.

I have discussed the findings and recommendations with the patient.
Results were also provided in writing at the conclusion of the
visit. If applicable, a reminder letter will be sent to the patient
regarding the next appointment.

BI-RADS CATEGORY  5: Highly suggestive of malignancy.

## 2018-11-17 IMAGING — MG DIGITAL DIAGNOSTIC UNILATERAL RIGHT MAMMOGRAM WITH TOMO AND CAD
5 of 7 series · 5 of 15 positions shown · non-contrast
Comparison: [DATE] and earlier priors from an outside
facility performed in [L7] and [L7].
COMPARISON: [DATE] and earlier priors from an outside
facility performed in [L7] and [L7].

Addendum:
CLINICAL DATA: 57-year-old patient recalled from recent 2D
screening mammogram for evaluation of a possible right breast mass.

EXAM:
DIGITAL DIAGNOSTIC RIGHT MAMMOGRAM WITH CAD AND TOMO
ULTRASOUND RIGHT BREAST

[R CC]
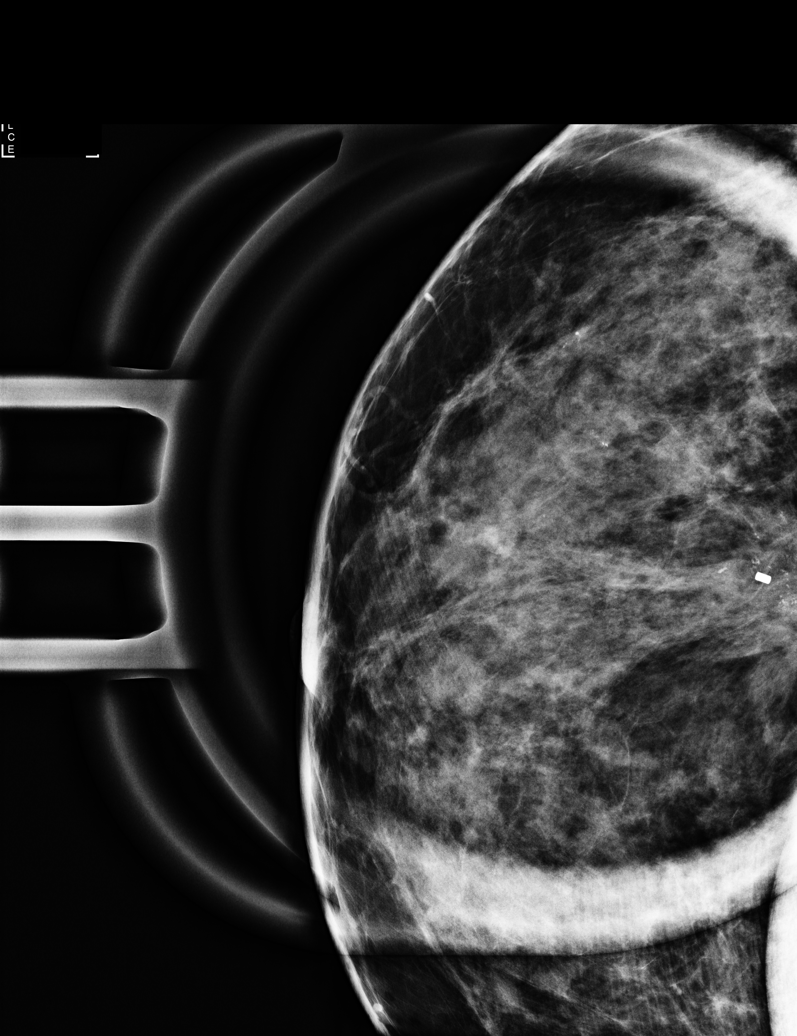

[R ML (1 of 2)]
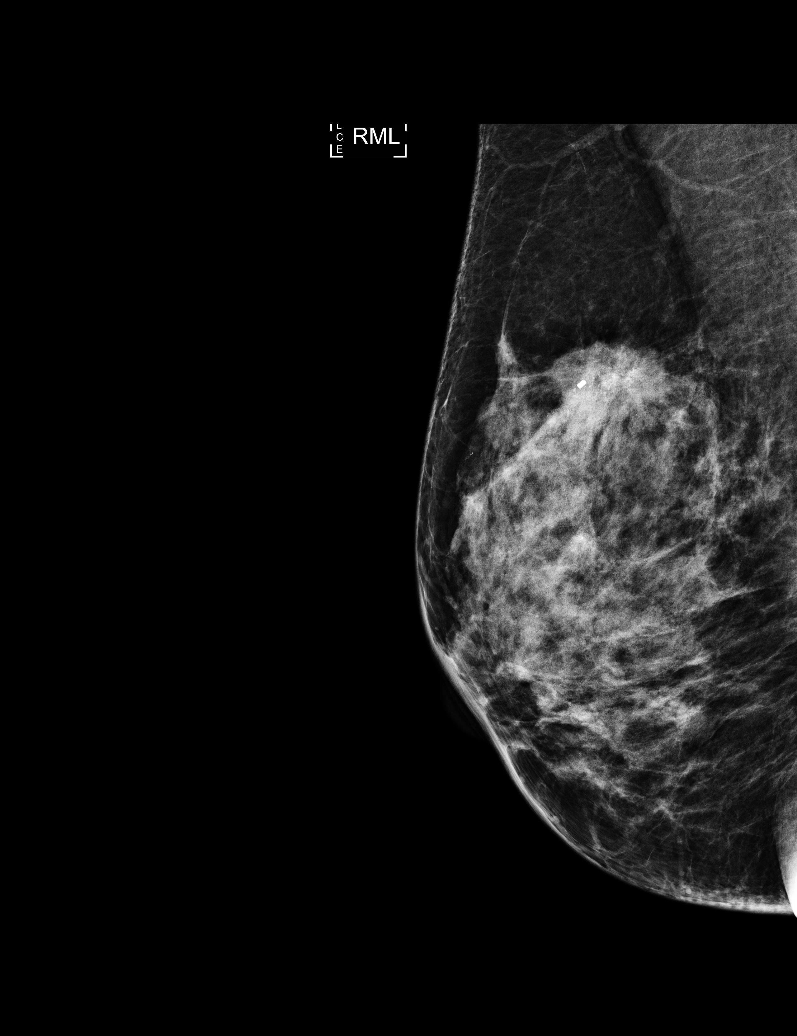

[R ML (2 of 2)]
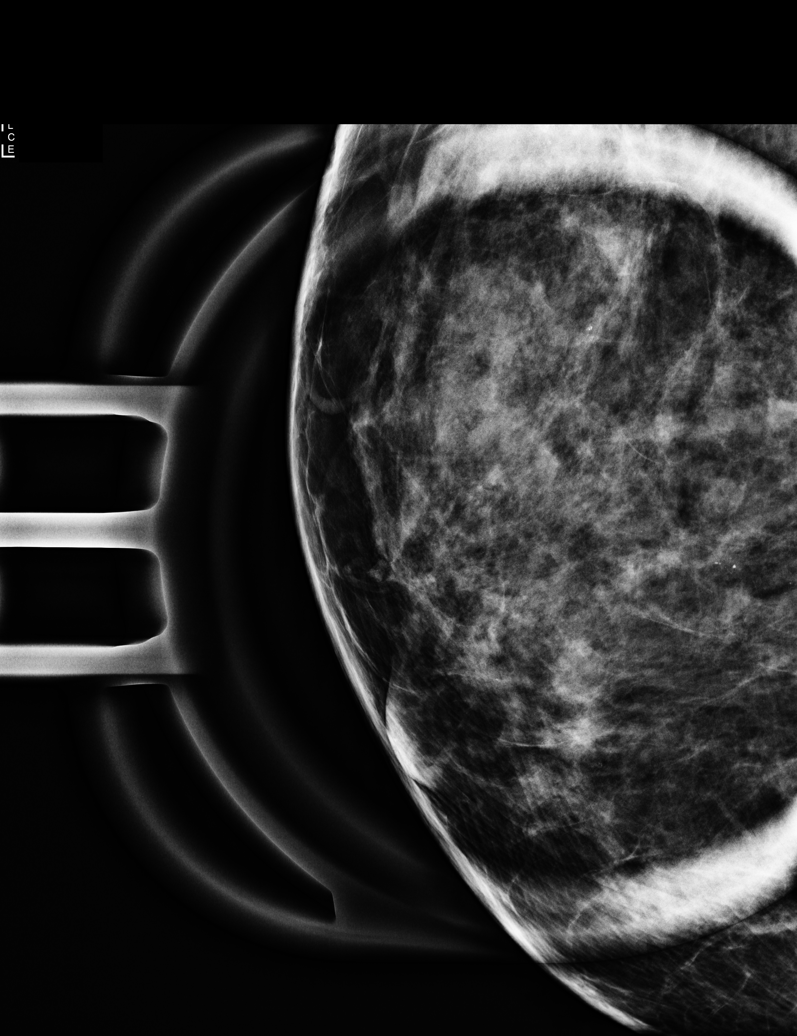

[R MLO synth-2D]
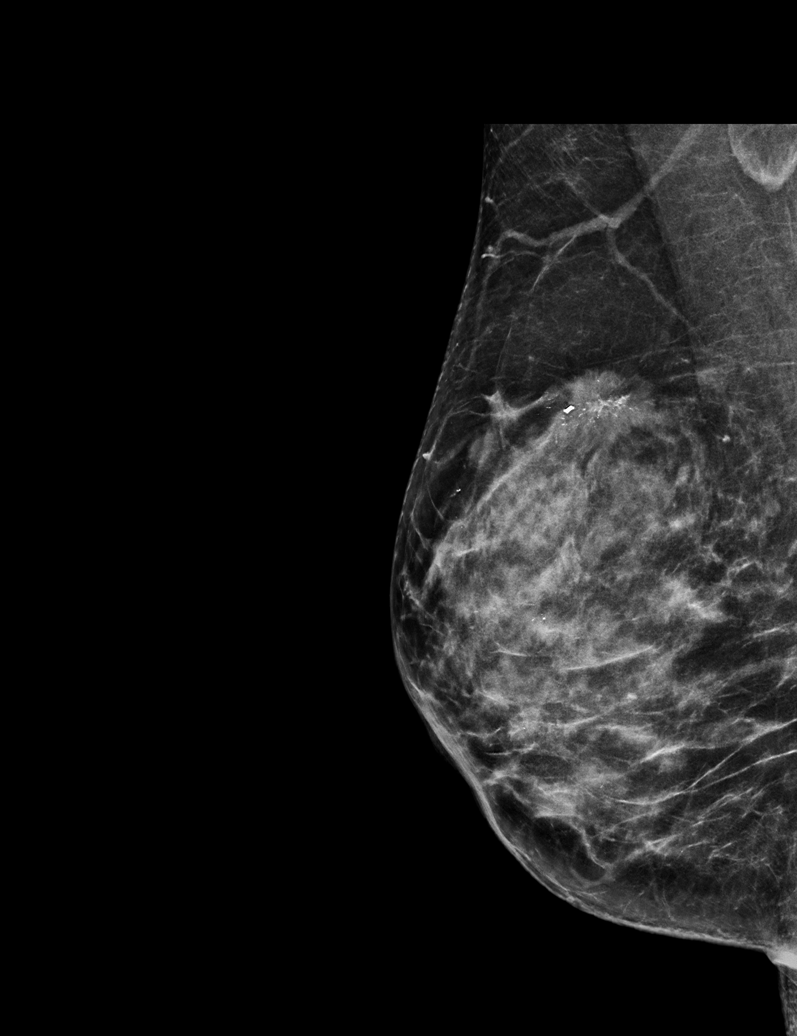

[R CC synth-2D]
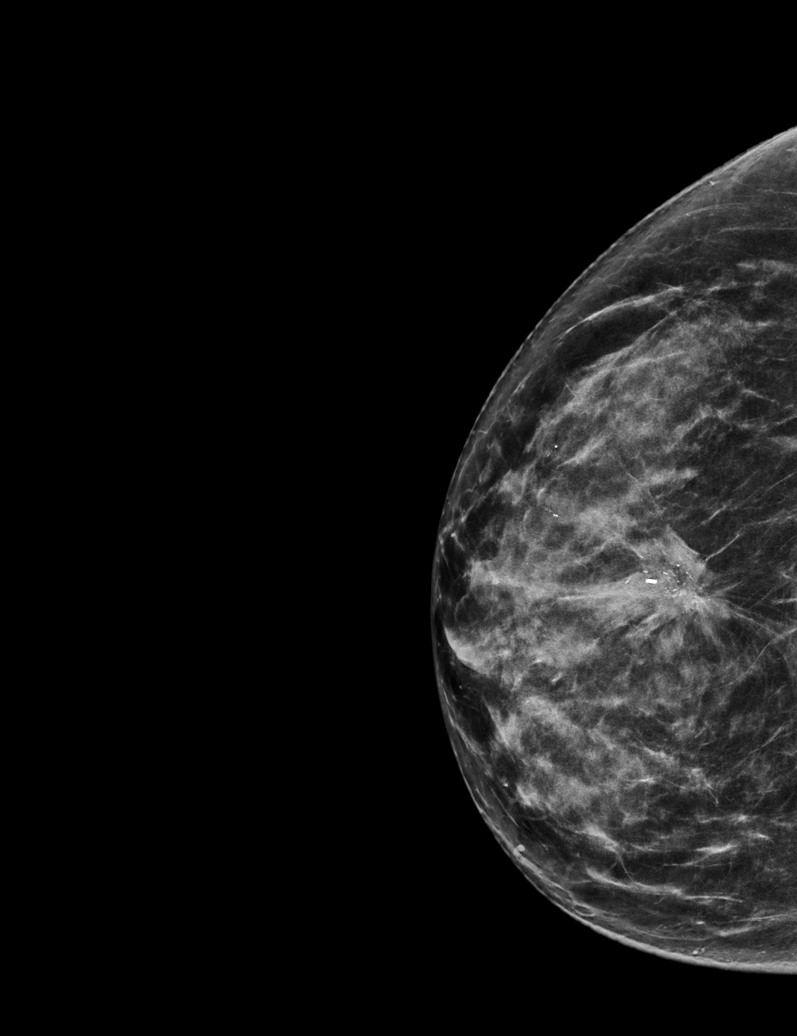

[5 of 15 positions shown; findings below may reference images not displayed]

ACR Breast Density Category c: The breast tissue is heterogeneously
dense, which may obscure small masses.
FINDINGS: Whole breast CC and MLO views with tomography confirm an irregular
and spiculated mass with multiple internal pleomorphic
calcifications. The mass is in the 11 o'clock position, mid to
posterior thirds, and measures approximately 2 cm greatest diameter.

In the anterior third of the upper outer right breast are 2 separate
small groups of heterogeneous microcalcifications evaluated with
magnification views. Both of these groups of calcifications span
approximately 3 mm.

Mammographic images were processed with CAD.

On physical exam, there is a firm palpable approximately 2 cm mass
at 12 o'clock position in the right breast 4 cm from the nipple.

Targeted ultrasound is performed, showing an irregular hypoechoic
mass with angulated margins and internal vascular flow at 12 o'clock
position approximately 4 cm from the nipple that measures 2.0 x
x 2.9 cm. Echogenicities within the mass are consistent with
calcifications seen at mammography.

Approximately 1 cm from this dominant mass, in the 11 o'clock
position 4 to 5 cm from the nipple is a slightly hypoechoic
irregular mass measuring 1.0 x 0.7 x 0.8 cm. There is internal
vascular flow.

At [DATE] position is an intramammary lymph node measuring 0.8 cm.

At [DATE] position 3 cm from the nipple is a parallel circumscribed
oval hypoechoic mass with posterior acoustic enhancement that
measures 1.3 x 0.5 x 1.3 cm and has no internal vascular flow. This
is favored to be a benign complicated cyst with some internal
septation.

Ultrasound of the right axilla is negative for lymphadenopathy.
IMPRESSION: 1. Suspicious palpable right breast mass at 12 o'clock position
measures 2.9 cm maximum diameter.
2. Suspicious 1.0 cm right breast mass at 11 o'clock position 4-5 cm
from the nipple.
3. Two small groups of microcalcifications in the upper-outer right
breast, separate from the mass. These are nonspecific, but ductal
carcinoma in situ can not be excluded.
4. Complicated cyst [DATE] position right breast.

RECOMMENDATION:
Two ultrasound-guided biopsies are recommended and have been
scheduled for the patient. These biopsies include the palpable mass
at 12 o'clock position and the non- palpable mass at 11 o'clock
position.

If either of these masses are malignant, then breast MRI is
suggested for evaluation of extent of disease, and to help determine
what additional biopsy(ies) may be warranted to evaluate for extent
of disease. If no additional suspicious areas are identified on
breast MRI, then 2 stereotactic biopsies, of the small groups of
calcifications, may be warranted.

I have discussed the findings and recommendations with the patient.
Results were also provided in writing at the conclusion of the
visit. If applicable, a reminder letter will be sent to the patient
regarding the next appointment.

BI-RADS CATEGORY  5: Highly suggestive of malignancy.

ADDENDUM:
The 2 groups of calcifications, separate from the palpable mass, are
distant from the mass by the following dimensions. The more medial
group lies 2.7 cm anteriorly and lateral to the palpable mass and
lies 2.9 cm inferior to the mass on the mL view. The more lateral
group lies 3.5 cm anterolateral to the palpable mass on the CC view
and 6.2 cm inferior to the mass on the mL view.

*** End of Addendum ***
ACR Breast Density Category c: The breast tissue is heterogeneously
dense, which may obscure small masses.
FINDINGS: Whole breast CC and MLO views with tomography confirm an irregular
and spiculated mass with multiple internal pleomorphic
calcifications. The mass is in the 11 o'clock position, mid to
posterior thirds, and measures approximately 2 cm greatest diameter.

In the anterior third of the upper outer right breast are 2 separate
small groups of heterogeneous microcalcifications evaluated with
magnification views. Both of these groups of calcifications span
approximately 3 mm.

Mammographic images were processed with CAD.

On physical exam, there is a firm palpable approximately 2 cm mass
at 12 o'clock position in the right breast 4 cm from the nipple.

Targeted ultrasound is performed, showing an irregular hypoechoic
mass with angulated margins and internal vascular flow at 12 o'clock
position approximately 4 cm from the nipple that measures 2.0 x
x 2.9 cm. Echogenicities within the mass are consistent with
calcifications seen at mammography.

Approximately 1 cm from this dominant mass, in the 11 o'clock
position 4 to 5 cm from the nipple is a slightly hypoechoic
irregular mass measuring 1.0 x 0.7 x 0.8 cm. There is internal
vascular flow.

At [DATE] position is an intramammary lymph node measuring 0.8 cm.

At [DATE] position 3 cm from the nipple is a parallel circumscribed
oval hypoechoic mass with posterior acoustic enhancement that
measures 1.3 x 0.5 x 1.3 cm and has no internal vascular flow. This
is favored to be a benign complicated cyst with some internal
septation.

Ultrasound of the right axilla is negative for lymphadenopathy.
IMPRESSION: 1. Suspicious palpable right breast mass at 12 o'clock position
measures 2.9 cm maximum diameter.
2. Suspicious 1.0 cm right breast mass at 11 o'clock position 4-5 cm
from the nipple.
3. Two small groups of microcalcifications in the upper-outer right
breast, separate from the mass. These are nonspecific, but ductal
carcinoma in situ can not be excluded.
4. Complicated cyst [DATE] position right breast.

RECOMMENDATION:
Two ultrasound-guided biopsies are recommended and have been
scheduled for the patient. These biopsies include the palpable mass
at 12 o'clock position and the non- palpable mass at 11 o'clock
position.

If either of these masses are malignant, then breast MRI is
suggested for evaluation of extent of disease, and to help determine
what additional biopsy(ies) may be warranted to evaluate for extent
of disease. If no additional suspicious areas are identified on
breast MRI, then 2 stereotactic biopsies, of the small groups of
calcifications, may be warranted.

I have discussed the findings and recommendations with the patient.
Results were also provided in writing at the conclusion of the
visit. If applicable, a reminder letter will be sent to the patient
regarding the next appointment.

BI-RADS CATEGORY  5: Highly suggestive of malignancy.

## 2018-11-20 ENCOUNTER — Other Ambulatory Visit: Payer: Self-pay | Admitting: Neurology

## 2018-11-20 DIAGNOSIS — G35 Multiple sclerosis: Secondary | ICD-10-CM

## 2018-11-21 ENCOUNTER — Ambulatory Visit
Admission: RE | Admit: 2018-11-21 | Discharge: 2018-11-21 | Disposition: A | Discharge status: Home / Self Care | Payer: 59 | Source: Ambulatory Visit | Attending: Internal Medicine | Admitting: Internal Medicine

## 2018-11-21 DIAGNOSIS — C50411 Malignant neoplasm of upper-outer quadrant of right female breast: Secondary | ICD-10-CM | POA: Diagnosis not present

## 2018-11-21 DIAGNOSIS — N6310 Unspecified lump in the right breast, unspecified quadrant: Secondary | ICD-10-CM

## 2018-11-21 DIAGNOSIS — N6315 Unspecified lump in the right breast, overlapping quadrants: Secondary | ICD-10-CM | POA: Diagnosis not present

## 2018-11-21 DIAGNOSIS — N6311 Unspecified lump in the right breast, upper outer quadrant: Secondary | ICD-10-CM | POA: Diagnosis not present

## 2018-11-21 DIAGNOSIS — C50811 Malignant neoplasm of overlapping sites of right female breast: Secondary | ICD-10-CM | POA: Diagnosis not present

## 2018-11-21 DIAGNOSIS — R921 Mammographic calcification found on diagnostic imaging of breast: Secondary | ICD-10-CM

## 2018-11-21 HISTORY — PX: BREAST BIOPSY: SHX20

## 2018-11-21 IMAGING — MG MM BREAST LOCALIZATION CLIP
2 series · 2 of 2 positions shown · non-contrast
Comparison: Previous exam(s).

CLINICAL DATA: Post biopsy mammogram of the right breast for clip
placement.

EXAM:
DIAGNOSTIC RIGHT MAMMOGRAM POST ULTRASOUND BIOPSY

[R CC]
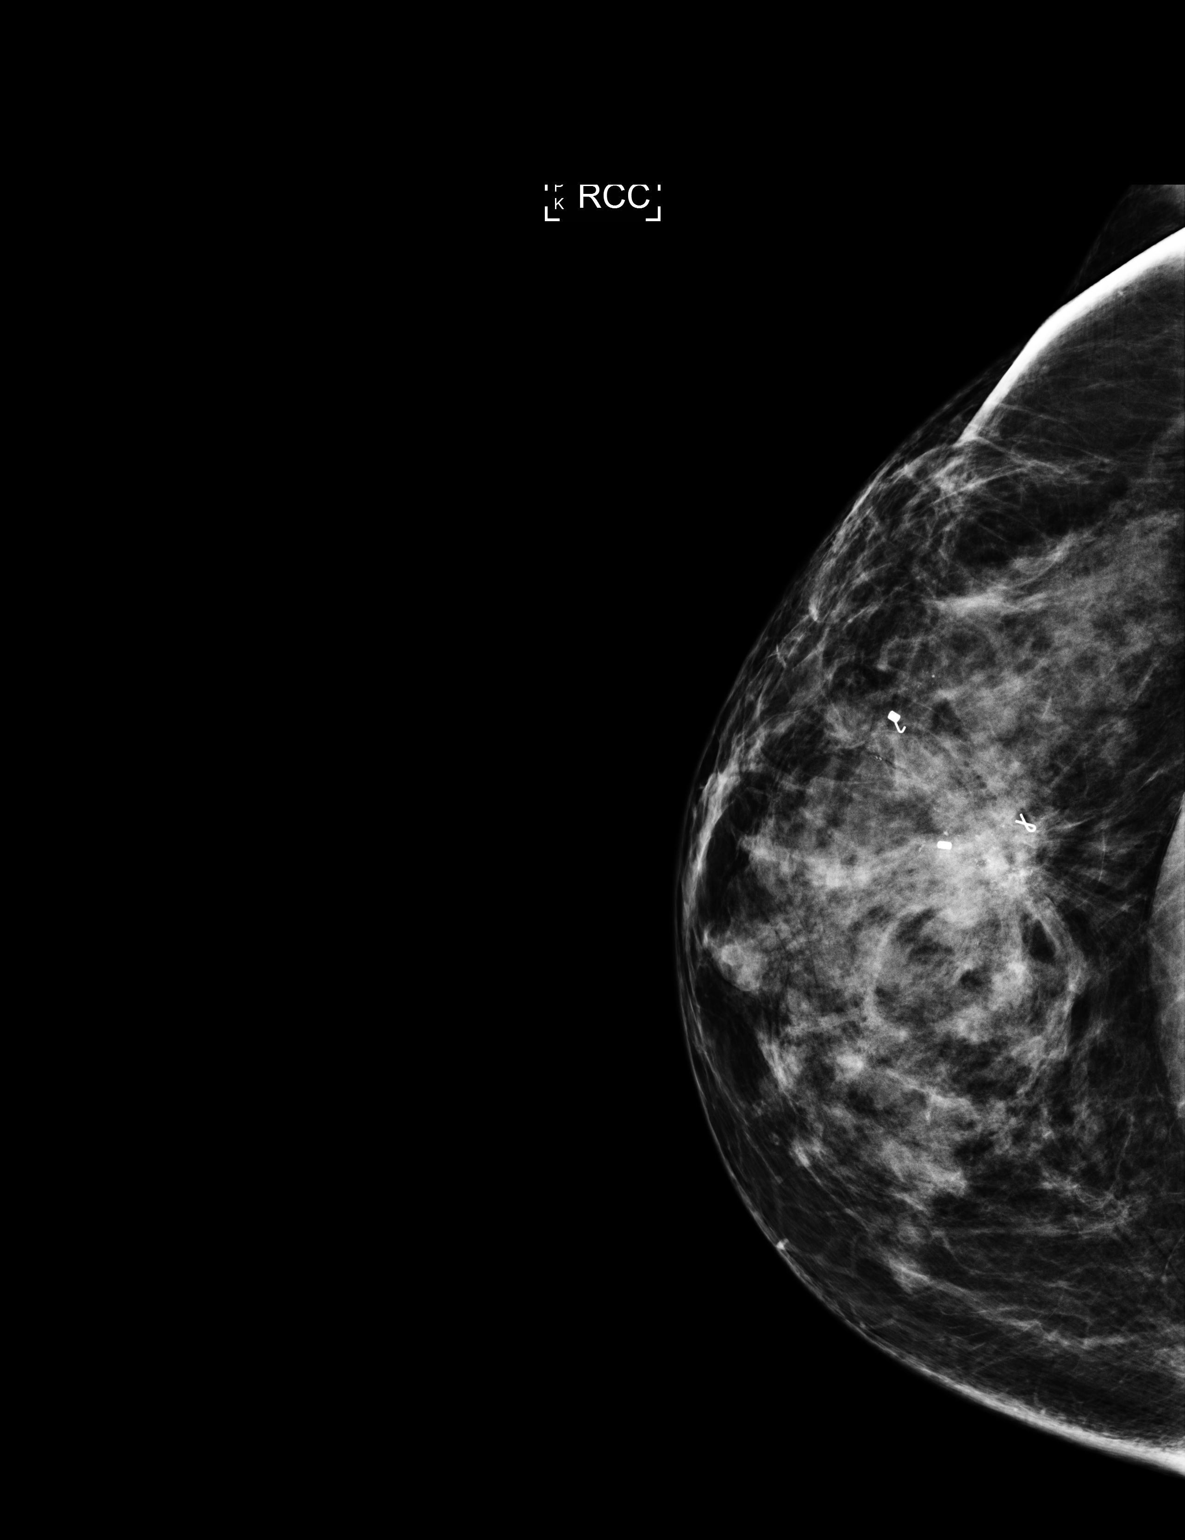

[R ML]
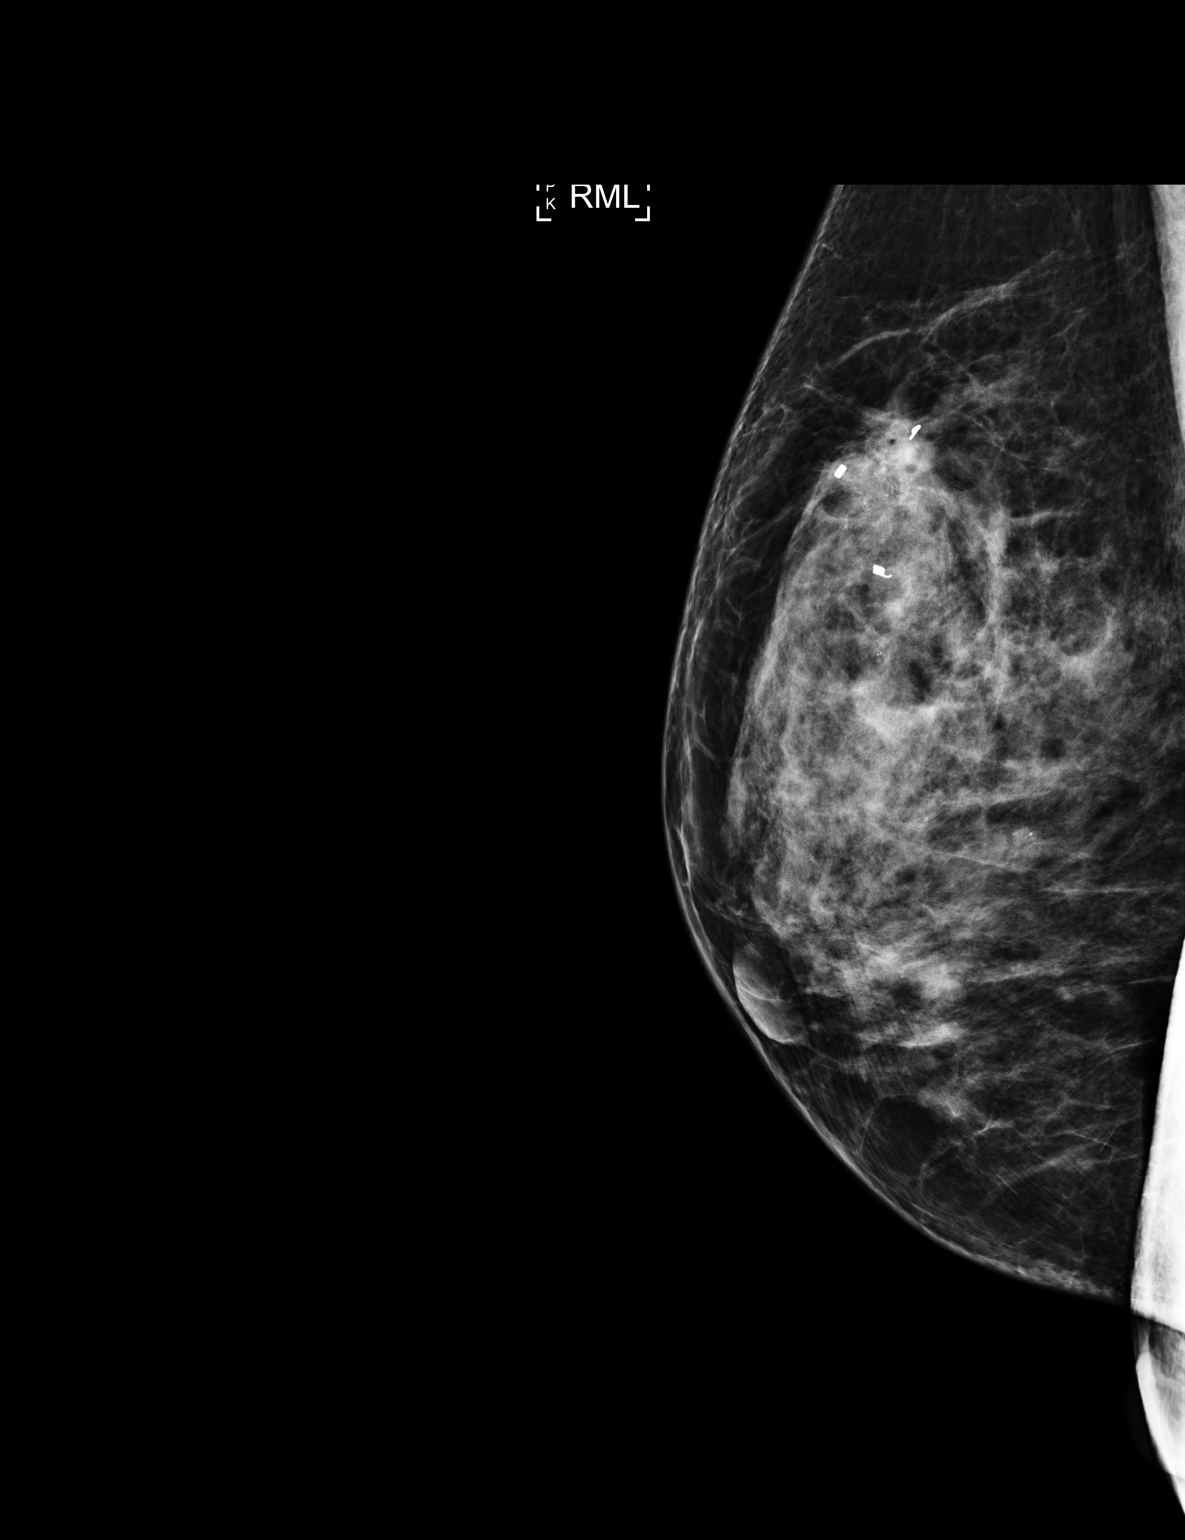

[2 of 2 positions shown; findings below may reference images not displayed]

FINDINGS: Mammographic images were obtained following ultrasound guided biopsy
of 2 right breast masses. The ribbon shaped biopsy marking clip is
positioned at the posterosuperior margin of the targeted mass. The
coil shaped biopsy marking clip is positioned at the site of the
second biopsy in the upper outer right breast. The 2 clips are
approximately 2.5 cm apart.
IMPRESSION: Appropriate positioning of the ribbon shaped biopsy marking clip at
the site of the right breast mass at 12 o'clock. Appropriate
positioning of the coil shaped biopsy marking clip at the site of
the mass at 11 o'clock in the right breast. The 2 clips lie at
cm apart.

Final Assessment: Post Procedure Mammograms for Marker Placement

## 2018-11-21 IMAGING — US US BREAST BX W LOC DEV 1ST LESION IMG BX SPEC US GUIDE*R*
1 series · 12 of 20 positions shown · non-contrast
Comparison: Previous exam(s).

Addendum:
CLINICAL DATA: 57-year-old female presenting for ultrasound-guided
biopsies of the right breast.

EXAM:
ULTRASOUND GUIDED RIGHT BREAST CORE NEEDLE BIOPSY

[Series 1: us breast bx w loc dev 1st lesion img bx spec us g · 0.07mm/px · 12 of 20 slices shown]
[im 1/20]
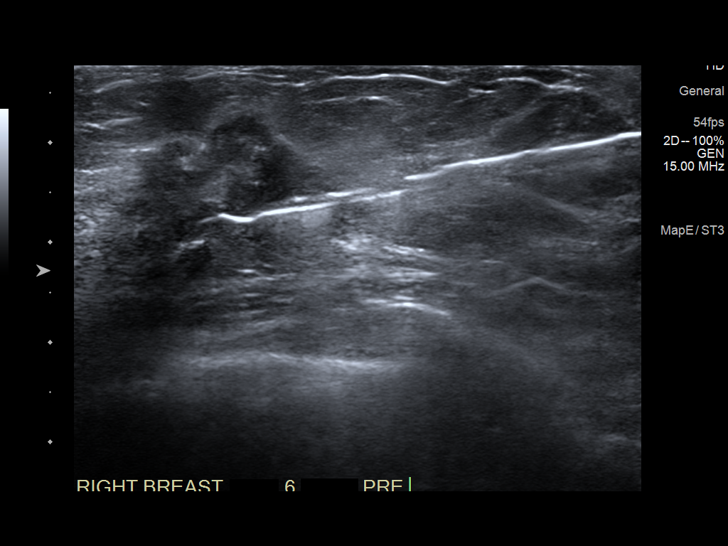
[im 3/20]
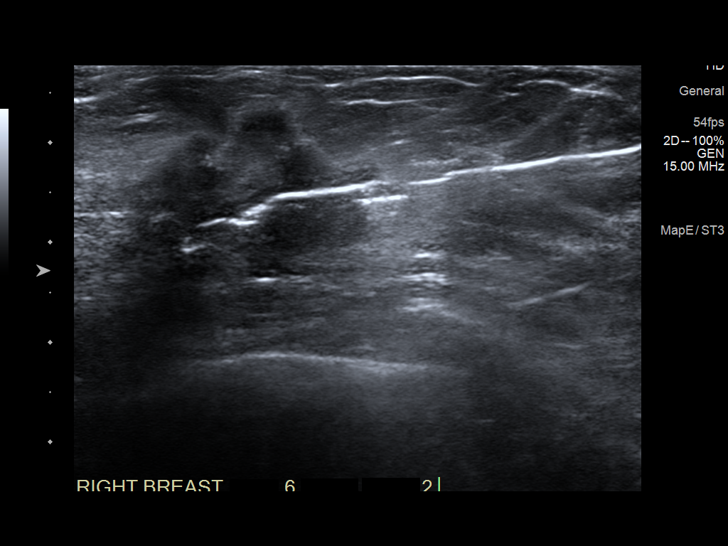
[im 5/20]
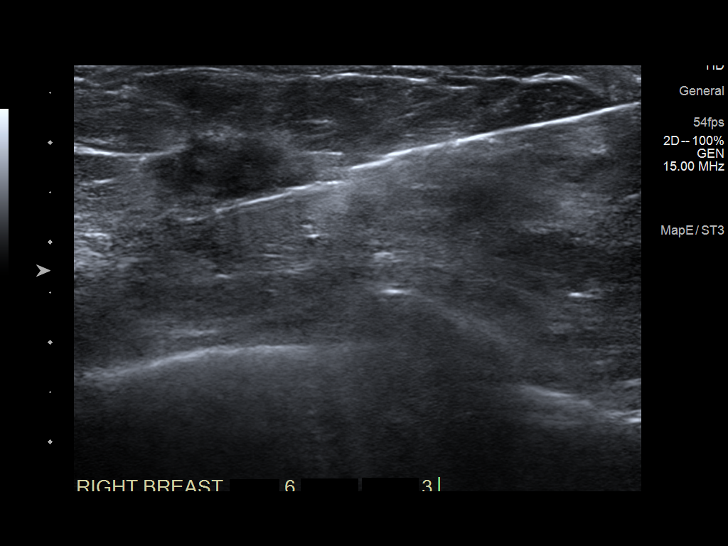
[im 6/20]
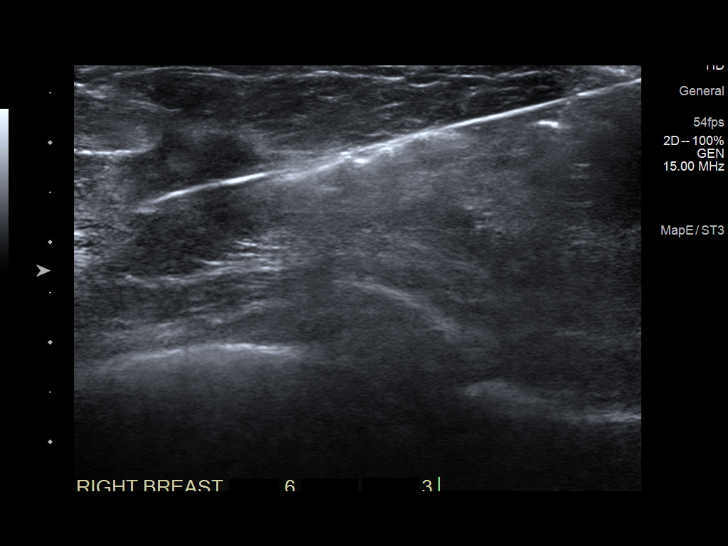
[im 8/20]
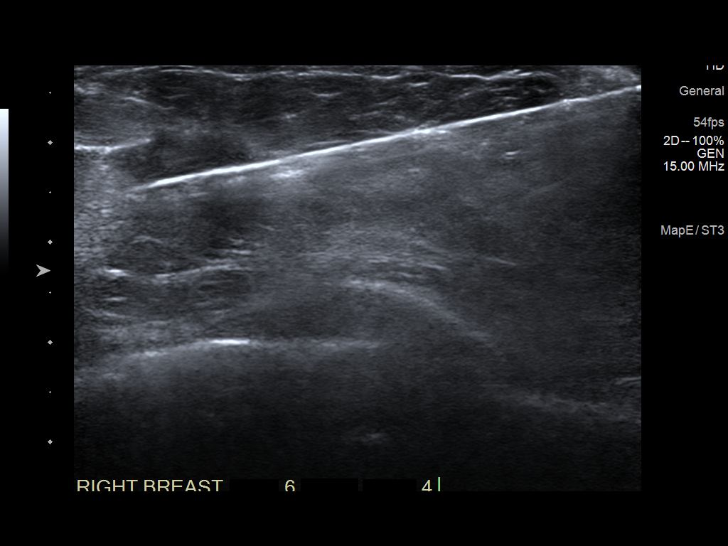
[im 10/20]
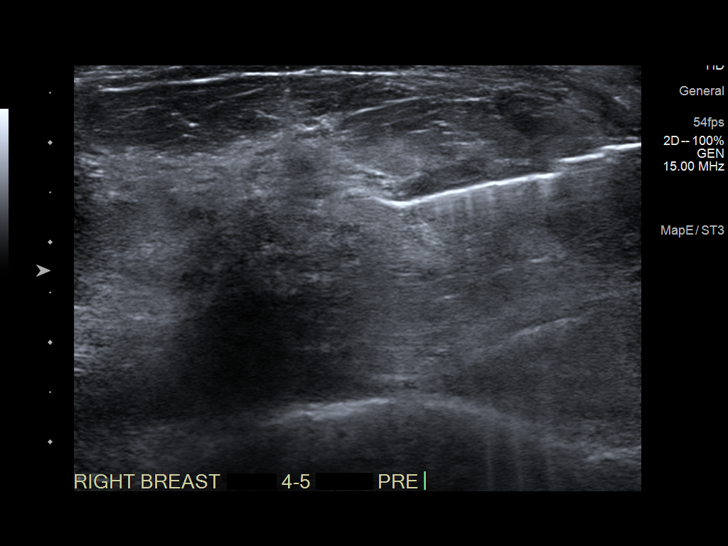
[im 11/20]
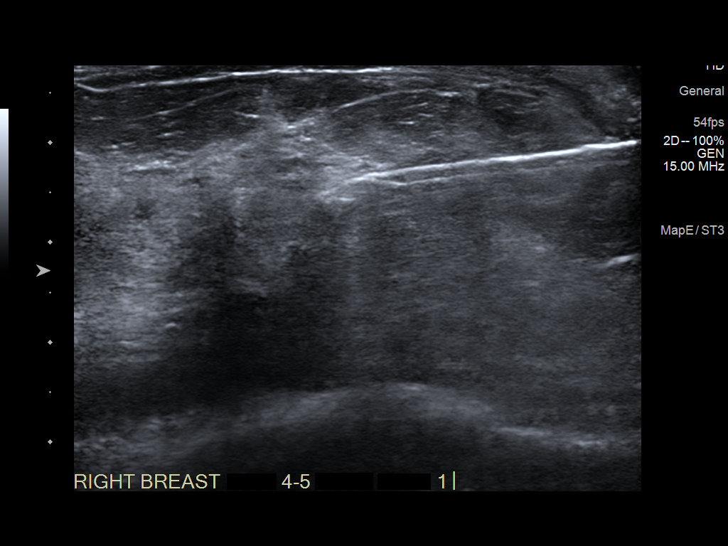
[im 13/20]
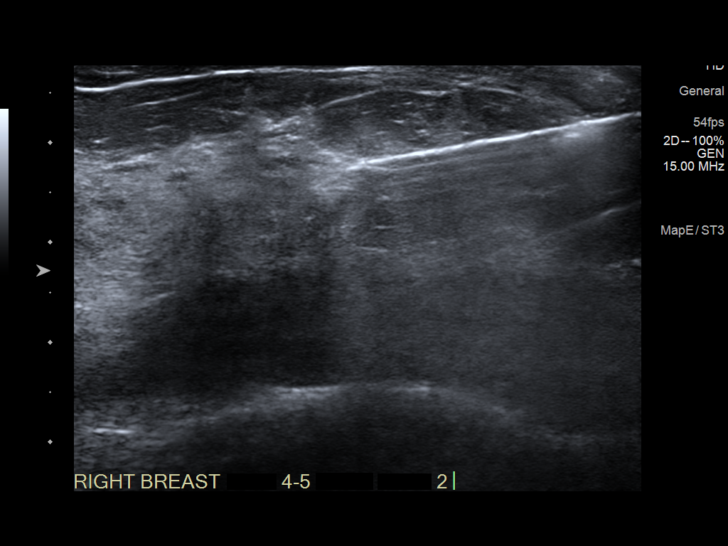
[im 15/20]
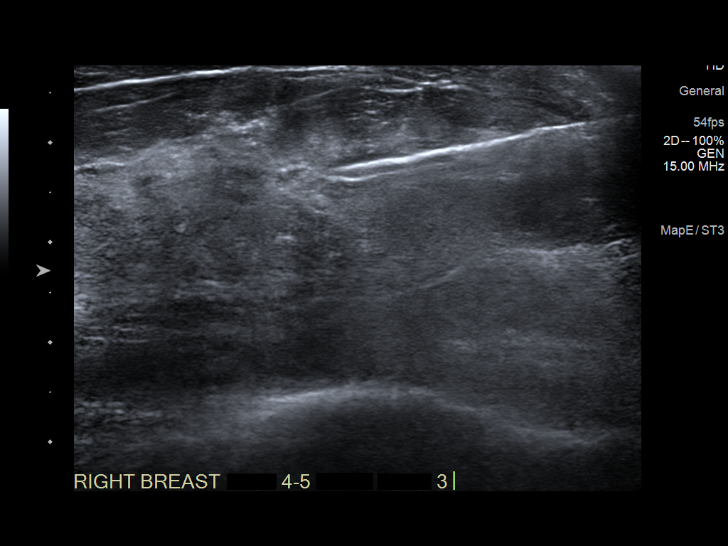
[im 16/20]
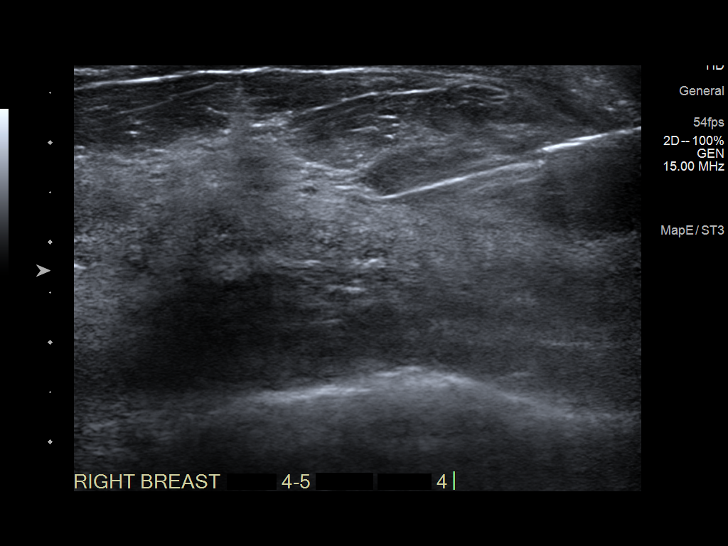
[im 18/20]
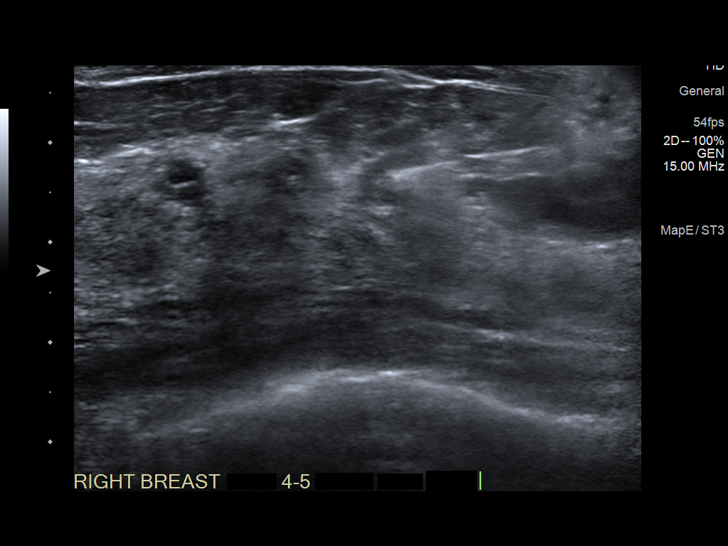
[im 20/20]
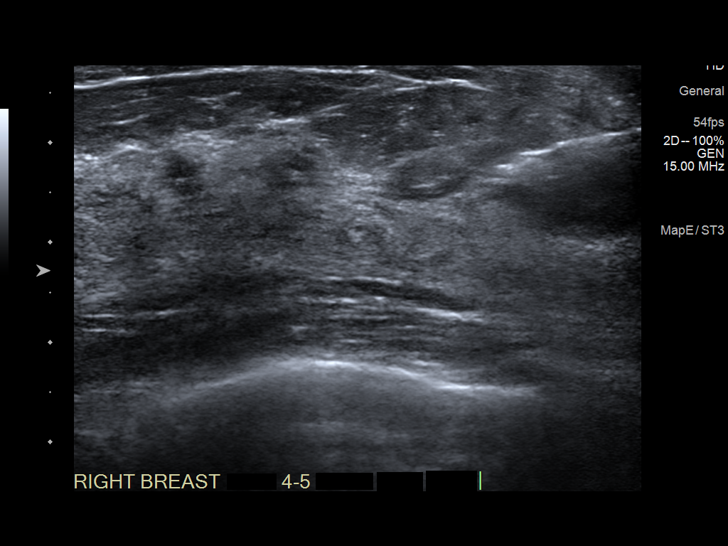

[12 of 20 positions shown; findings below may reference images not displayed]



#1 Lesion quadrant: Upper outer quadrant

Using sterile technique and 1% Lidocaine as local anesthetic, under
direct ultrasound visualization, a 14 gauge BURDIEL device was
used to perform biopsy of a mass in the right breast 12 o'clock
using an inferolateral approach. At the conclusion of the procedure
a ribbon shaped tissue marker clip was deployed into the biopsy
cavity.

--------------------------------------------------------------------------------------------------------------------------------------------

#2 Lesion quadrant: Upper-outer quadrant

Using sterile technique and 1% Lidocaine as local anesthetic, under
direct ultrasound visualization, a 14 gauge BURDIEL device was
used to perform biopsy of a mass in the right breast at 11 o'clock
using a lateral approach through the same incision site as the
biopsy above. At the conclusion of the procedure a coil shaped
tissue marker clip was deployed into the biopsy cavity.

Follow up 2 view mammogram was performed and dictated separately.
IMPRESSION: 1. Ultrasound guided biopsy of a right breast mass at 12 o'clock. No
apparent complications.

2. Ultrasound guided biopsy of a right breast mass at 11 o'clock. No
apparent complications.

ADDENDUM:
Pathology revealed GRADE II INVASIVE DUCTAL CARCINOMA, HIGH GRADE
DUCTAL CARCINOMA IN SITU of the Right breast, 12 o'clock.

GRADE III INVASIVE DUCTAL CARCINOMA, CALCIFICATIONS of the Right
breast, 11 o'clock. This was found to be concordant by Dr. BURDIEL
BURDIEL.

Pathology results were discussed with the patient by telephone. The
patient reported doing well after the biopsies with tenderness at
the sites. Post biopsy instructions and care were reviewed and
questions were answered. The patient was encouraged to call The

The patient was referred to [REDACTED]
[REDACTED] at [REDACTED] on
[DATE].

A bilateral breast MRI is suggested for evaluation of extent of
disease, and to help determine what additional biopsy(ies) may be
warranted to evaluate for extent of disease, if breast conservation
will be considered. If no additional suspicious areas are identified
on breast MRI, then 2 stereotactic biopsies, of the small groups of
calcifications, may be warranted.

Pathology results reported by BURDIEL, RN on [DATE].

*** End of Addendum ***

## 2018-11-22 ENCOUNTER — Telehealth: Payer: Self-pay | Admitting: Hematology and Oncology

## 2018-11-22 NOTE — Telephone Encounter (Signed)
Spoke with patient to confirm afternoon [facility] appointment for 2/5, packet mailed to patient

## 2018-11-23 ENCOUNTER — Encounter: Payer: Self-pay | Admitting: *Deleted

## 2018-11-27 ENCOUNTER — Other Ambulatory Visit: Payer: Self-pay

## 2018-11-27 ENCOUNTER — Telehealth: Payer: Self-pay | Admitting: Hematology and Oncology

## 2018-11-27 ENCOUNTER — Other Ambulatory Visit: Payer: Self-pay | Admitting: *Deleted

## 2018-11-27 DIAGNOSIS — C50411 Malignant neoplasm of upper-outer quadrant of right female breast: Secondary | ICD-10-CM | POA: Insufficient documentation

## 2018-11-27 DIAGNOSIS — Z170 Estrogen receptor positive status [ER+]: Principal | ICD-10-CM

## 2018-11-27 MED ORDER — VENTOLIN HFA 108 (90 BASE) MCG/ACT IN AERS: 1.0000 | INHALATION_SPRAY | Freq: Four times a day (QID) | RESPIRATORY_TRACT | 3 refills | Status: DC | PRN

## 2018-11-27 NOTE — Telephone Encounter (Signed)
VENTOLIN HFA 108 (90 Base) MCG/ACT inhaler,refill request @  [id number] [address], [place] (Phone) [phone] (Fax)

## 2018-11-27 NOTE — Telephone Encounter (Signed)
Created in error

## 2018-11-27 NOTE — Telephone Encounter (Signed)
Refill Approved

## 2018-11-29 ENCOUNTER — Ambulatory Visit: Payer: Medicare HMO | Attending: General Surgery | Admitting: Physical Therapy

## 2018-11-29 ENCOUNTER — Ambulatory Visit
Admission: RE | Admit: 2018-11-29 | Discharge: 2018-11-29 | Disposition: A | Discharge status: Home / Self Care | Payer: Medicare HMO | Source: Ambulatory Visit | Attending: Radiation Oncology | Admitting: Radiation Oncology

## 2018-11-29 ENCOUNTER — Encounter: Payer: Self-pay | Admitting: Physical Therapy

## 2018-11-29 ENCOUNTER — Encounter: Payer: Self-pay | Admitting: Hematology and Oncology

## 2018-11-29 ENCOUNTER — Other Ambulatory Visit: Payer: Self-pay

## 2018-11-29 ENCOUNTER — Inpatient Hospital Stay: Payer: Medicare HMO

## 2018-11-29 ENCOUNTER — Inpatient Hospital Stay: Payer: Medicare HMO | Attending: Hematology and Oncology | Admitting: Hematology and Oncology

## 2018-11-29 VITALS — BP 135/83 | HR 82 | Temp 98.5°F | Resp 18 | Wt 138.8 lb

## 2018-11-29 DIAGNOSIS — Z720 Tobacco use: Secondary | ICD-10-CM | POA: Diagnosis not present

## 2018-11-29 DIAGNOSIS — Z5112 Encounter for antineoplastic immunotherapy: Secondary | ICD-10-CM | POA: Diagnosis not present

## 2018-11-29 DIAGNOSIS — C50911 Malignant neoplasm of unspecified site of right female breast: Secondary | ICD-10-CM | POA: Diagnosis not present

## 2018-11-29 DIAGNOSIS — Z170 Estrogen receptor positive status [ER+]: Principal | ICD-10-CM

## 2018-11-29 DIAGNOSIS — C50411 Malignant neoplasm of upper-outer quadrant of right female breast: Secondary | ICD-10-CM | POA: Insufficient documentation

## 2018-11-29 DIAGNOSIS — M25611 Stiffness of right shoulder, not elsewhere classified: Secondary | ICD-10-CM | POA: Diagnosis not present

## 2018-11-29 DIAGNOSIS — Z808 Family history of malignant neoplasm of other organs or systems: Secondary | ICD-10-CM

## 2018-11-29 DIAGNOSIS — D0511 Intraductal carcinoma in situ of right breast: Secondary | ICD-10-CM | POA: Diagnosis not present

## 2018-11-29 DIAGNOSIS — Z5111 Encounter for antineoplastic chemotherapy: Secondary | ICD-10-CM | POA: Diagnosis not present

## 2018-11-29 DIAGNOSIS — Z5189 Encounter for other specified aftercare: Secondary | ICD-10-CM | POA: Insufficient documentation

## 2018-11-29 DIAGNOSIS — F17210 Nicotine dependence, cigarettes, uncomplicated: Secondary | ICD-10-CM | POA: Diagnosis not present

## 2018-11-29 DIAGNOSIS — R921 Mammographic calcification found on diagnostic imaging of breast: Secondary | ICD-10-CM | POA: Diagnosis not present

## 2018-11-29 DIAGNOSIS — R293 Abnormal posture: Secondary | ICD-10-CM | POA: Insufficient documentation

## 2018-11-29 LAB — CMP (CANCER CENTER ONLY)
ALT: 51 U/L — ABNORMAL HIGH (ref 0–44)
AST: 28 U/L (ref 15–41)
Albumin: 4.2 g/dL (ref 3.5–5.0)
Alkaline Phosphatase: 84 U/L (ref 38–126)
Anion gap: 7 (ref 5–15)
BUN: 17 mg/dL (ref 6–20)
CHLORIDE: 103 mmol/L (ref 98–111)
CO2: 31 mmol/L (ref 22–32)
Calcium: 9.5 mg/dL (ref 8.9–10.3)
Creatinine: 0.74 mg/dL (ref 0.44–1.00)
Glucose, Bld: 97 mg/dL (ref 70–99)
Potassium: 4.2 mmol/L (ref 3.5–5.1)
Sodium: 141 mmol/L (ref 135–145)
Total Bilirubin: 0.2 mg/dL — ABNORMAL LOW (ref 0.3–1.2)
Total Protein: 7 g/dL (ref 6.5–8.1)

## 2018-11-29 LAB — CBC WITH DIFFERENTIAL (CANCER CENTER ONLY)
Abs Immature Granulocytes: 0.02 10*3/uL (ref 0.00–0.07)
BASOS PCT: 1 %
Basophils Absolute: 0.1 10*3/uL (ref 0.0–0.1)
Eosinophils Absolute: 0.1 10*3/uL (ref 0.0–0.5)
Eosinophils Relative: 1 %
HCT: 41.6 % (ref 36.0–46.0)
Hemoglobin: 13.6 g/dL (ref 12.0–15.0)
Immature Granulocytes: 0 %
Lymphocytes Relative: 15 %
Lymphs Abs: 1 10*3/uL (ref 0.7–4.0)
MCH: 30 pg (ref 26.0–34.0)
MCHC: 32.7 g/dL (ref 30.0–36.0)
MCV: 91.8 fL (ref 80.0–100.0)
Monocytes Absolute: 0.8 10*3/uL (ref 0.1–1.0)
Monocytes Relative: 11 %
NEUTROS ABS: 4.9 10*3/uL (ref 1.7–7.7)
Neutrophils Relative %: 72 %
PLATELETS: 275 10*3/uL (ref 150–400)
RBC: 4.53 MIL/uL (ref 3.87–5.11)
RDW: 13.1 % (ref 11.5–15.5)
WBC Count: 6.8 10*3/uL (ref 4.0–10.5)
nRBC: 0 % (ref 0.0–0.2)

## 2018-11-29 MED ORDER — LORAZEPAM 0.5 MG PO TABS: 0.5000 mg | ORAL_TABLET | Freq: Every evening | ORAL | 0 refills | Status: DC | PRN

## 2018-11-29 MED ORDER — ONDANSETRON HCL 8 MG PO TABS: 8.0000 mg | ORAL_TABLET | Freq: Two times a day (BID) | ORAL | 1 refills | Status: DC | PRN

## 2018-11-29 MED ORDER — LIDOCAINE-PRILOCAINE 2.5-2.5 % EX CREA: TOPICAL_CREAM | CUTANEOUS | 3 refills | Status: DC

## 2018-11-29 MED ORDER — PROCHLORPERAZINE MALEATE 10 MG PO TABS: 10.0000 mg | ORAL_TABLET | Freq: Four times a day (QID) | ORAL | 1 refills | Status: DC | PRN

## 2018-11-29 MED ORDER — DEXAMETHASONE 4 MG PO TABS: 4.0000 mg | ORAL_TABLET | Freq: Every day | ORAL | 0 refills | Status: DC

## 2018-11-29 NOTE — Progress Notes (Signed)
[place] NOTE  Patient Care Team: Neva Seat, MD as PCP - General (Internal Medicine) Excell Seltzer, MD as Consulting Physician (General Surgery) Nicholas Lose, MD as Consulting Physician (Hematology and Oncology) Kyung Rudd, MD as Consulting Physician (Radiation Oncology)  CHIEF COMPLAINTS/PURPOSE OF CONSULTATION: Newly diagnosed breast cancer  HISTORY OF PRESENTING ILLNESS:  Debbie Watts 58 y.o. female is here because of recent diagnosis of invasive ductal carcinoma of the right breast with DCIS. The cancer was detected on a screening mammogram on 11/08/18 and was not palpable prior to diagnosis. A diagnostic mammogram on 11/17/18 showed a 2.9cm mass in the 12 o'clock position, a 1cm in the 11 o'clock position, and two groups of microcalcifications in the right breast. A biopsy on 11/21/18 showed the mass at the 12 o'clock position to be grade 2, HER2 positive, ER 100%, PR 70%, Ki67 10% and the mass at the 11 o'clock position to be grade 3, HER2 positive, ER 90%, PR 50%, Ki67 15%.   She presents to the clinic today with her boyfriend and his daughter. She denies any family history of breast cancer, but was adopted and has limited knowledge of her family history. She was previously thinking about getting a lumpectomy, but is now leaning toward a mastectomy. She is not interested in using the [facility]. She currently takes gabapentin and sinemet for neuropathy in her legs and feet. She is a heavy smoker. Her boyfriend asked if smoking marijuana during treatment would help with nausea and if it was okay. She is interested in participating in the UpBeat clinical trial.   I reviewed her records extensively and collaborated the history with the patient.  SUMMARY OF ONCOLOGIC HISTORY:   Malignant neoplasm of upper-outer quadrant of right breast in female, estrogen receptor positive ([place])   11/21/2018 Initial Diagnosis    Screening detected right breast mass upper  outer quadrant, in addition to areas of calcifications which were not biopsied.  By ultrasound she had 2 breast masses 12 o'clock position 2.9 cm: Grade 2 IDC with high-grade DCIS ER 100%, PR 70%, Ki-67 10%, HER-2 3+ by IHC and 11 o'clock position 1 cm, ER 90%, PR 50%, Ki-67 15%, HER-2 3+ by IHC, T2N0 stage Ib    11/29/2018 Cancer Staging    Staging form: Breast, AJCC 8th Edition - Clinical stage from 11/29/2018: Stage IB (cT2, cN0, cM0, G3, ER+, PR+, HER2+) - Signed by Nicholas Lose, MD on 11/29/2018    MEDICAL HISTORY:  Past Medical History:  Diagnosis Date  . Depression   . Deviated nasal septum   . Hearing loss 06/11/2014   Bilateral  . Herniated cervical disc   . Hypertension   . Migraine   . Miscarriage    2  . MS (multiple sclerosis) ([place])     SURGICAL HISTORY: Past Surgical History:  Procedure Laterality Date  . NASAL SEPTUM SURGERY    . spinal injections      SOCIAL HISTORY: Social History   Socioeconomic History  . Marital status: Single    Spouse name: Not on file  . Number of children: 2  . Years of education: 45  . Highest education level: Not on file  Occupational History  . Occupation: Disabled  Social Needs  . Financial resource strain: Not on file  . Food insecurity:    Worry: Not on file    Inability: Not on file  . Transportation needs:    Medical: Not on file    Non-medical: Not on  file  Tobacco Use  . Smoking status: Current Every Day Smoker    Packs/day: 0.50    Years: 44.00    Pack years: 22.00    Types: Cigarettes  . Smokeless tobacco: Never Used  . Tobacco comment: 1/2  to 3/4 pack per day   Substance and Sexual Activity  . Alcohol use: Yes    Comment: 0-5 beers daily  . Drug use: Yes    Types: Marijuana  . Sexual activity: Not Currently    Partners: Male  Lifestyle  . Physical activity:    Days per week: Not on file    Minutes per session: Not on file  . Stress: Not on file  Relationships  . Social connections:    Talks on  phone: Not on file    Gets together: Not on file    Attends religious service: Not on file    Active member of club or organization: Not on file    Attends meetings of clubs or organizations: Not on file    Relationship status: Not on file  . Intimate partner violence:    Fear of current or ex partner: Not on file    Emotionally abused: Not on file    Physically abused: Not on file    Forced sexual activity: Not on file  Other Topics Concern  . Not on file  Social History Narrative   Patient is single with 2 children.   Patient is right handed.   Current Social History 08/10/2018        Right handed       Patient lives with significant other [name]) in one level Townhome 08/10/2018    Transportation: Patient has own vehicle  08/10/2018   Important Relationships [name] 08/10/2018    Pets: one dog named Browser 08/10/2018   Education / Work:  12th grade/ Disabled 08/10/2018   Interests / Fun: Watching baseball and Nascar 08/10/2018   Current Stressors: Significant other is depressed over his poor health 08/10/2018   Religious / Personal Beliefs: "I believe in God" 08/10/2018   Other: "I had a miscarriage before I had my 2 sons." 08/10/2018   L. Ducatte, RN, BSN                                                                                                  FAMILY HISTORY: Family History  Adopted: Yes  Problem Relation Age of Onset  . Cancer Maternal Uncle        throat    ALLERGIES:  is allergic to procaine.  MEDICATIONS:  Current Outpatient Medications  Medication Sig Dispense Refill  . carbidopa-levodopa (SINEMET IR) 10-100 MG tablet TAKE 1 TABLET BY MOUTH AT BEDTIME. 30 tablet 5  . cyclobenzaprine (FLEXERIL) 10 MG tablet TAKE 1 TABLET BY MOUTH EVERYDAY AT BEDTIME 30 tablet 1  . diclofenac (VOLTAREN) 75 MG EC tablet TAKE 1 TABLET BY MOUTH TWICE A DAY 60 tablet 5  . Dimethyl Fumarate 240 MG CPDR Take 1 capsule (240 mg total) by mouth 2 (two) times daily. 180  capsule 3  . gabapentin (  NEURONTIN) 300 MG capsule TAKE 2 CAPSULES (600 MG TOTAL) BY MOUTH 2 (TWO) TIMES DAILY. 360 capsule 1  . lisinopril (PRINIVIL,ZESTRIL) 5 MG tablet TAKE 1 TABLET BY MOUTH EVERY DAY 90 tablet 3  . PARoxetine (PAXIL) 20 MG tablet Take 1 tablet (20 mg total) by mouth daily. 90 tablet 3  . traMADol (ULTRAM) 50 MG tablet Take 1 tablet (50 mg total) by mouth 2 (two) times daily as needed. 60 tablet 0  . VENTOLIN HFA 108 (90 Base) MCG/ACT inhaler Inhale 1-2 puffs into the lungs every 6 (six) hours as needed for wheezing or shortness of breath. 1 Inhaler 3  . Acetaminophen-Caffeine (TENSION HEADACHE RELIEF PO) Take by mouth.    . betamethasone valerate lotion (VALISONE) 0.1 % APPLY 1 APPLICATION TOPICALLY 2 TIMES DAILY.    Marland Kitchen eletriptan (RELPAX) 40 MG tablet Take 40 mg by mouth as needed for migraine or headache. One tablet by mouth at onset of headache. May repeat in 2 hours if headache persists or recurs.    . ferrous sulfate 325 (65 FE) MG tablet Take 325 mg by mouth daily.    Marland Kitchen oxybutynin (DITROPAN) 5 MG tablet TAKE 1 TABLET BY MOUTH TWICE A DAY (Patient not taking: Reported on 11/29/2018) 180 tablet 1  . Tiotropium Bromide Monohydrate 1.25 MCG/ACT AERS Inhale into the lungs.    . traMADol (ULTRAM) 50 MG tablet TAKE 1 TABLET (50 MG TOTAL) BY MOUTH 2 (TWO) TIMES DAILY AS NEEDED. (Patient not taking: Reported on 11/29/2018) 60 tablet 0   No current facility-administered medications for this visit.     REVIEW OF SYSTEMS:   Constitutional: Denies fevers, chills or abnormal night sweats Eyes: Denies blurriness of vision, double vision or watery eyes Ears, nose, mouth, throat, and face: Denies mucositis or sore throat Respiratory: Denies cough, dyspnea or wheezes Cardiovascular: Denies palpitation, chest discomfort or lower extremity swelling Gastrointestinal:  Denies nausea, heartburn or change in bowel habits Skin: Denies abnormal skin rashes Lymphatics: Denies new lymphadenopathy  or easy bruising Neurological: (+) neuropathy in legs and feet Behavioral/Psych: Mood is stable, no new changes  Breast: Denies any discharge All other systems were reviewed with the patient and are negative.  PHYSICAL EXAMINATION: ECOG PERFORMANCE STATUS: 1 - Symptomatic but completely ambulatory  Vitals:   11/29/18 1300  BP: 135/83  Pulse: 82  Resp: 18  Temp: 98.5 F (36.9 C)  SpO2: 99%   Filed Weights   11/29/18 1300  Weight: 63 kg    GENERAL:alert, no distress and comfortable SKIN: skin color, texture, turgor are normal, no rashes or significant lesions EYES: normal, conjunctiva are pink and non-injected, sclera clear OROPHARYNX:no exudate, no erythema and lips, buccal mucosa, and tongue normal  NECK: supple, thyroid normal size, non-tender, without nodularity LYMPH:  no palpable lymphadenopathy in the cervical, axillary or inguinal LUNGS: clear to auscultation and percussion with normal breathing effort HEART: regular rate & rhythm and no murmurs and no lower extremity edema ABDOMEN:abdomen soft, non-tender and normal bowel sounds Musculoskeletal:no cyanosis of digits and no clubbing  PSYCH: alert & oriented x 3 with fluent speech NEURO: no focal motor/sensory deficits  LABORATORY DATA:  I have reviewed the data as listed Lab Results  Component Value Date   WBC 6.8 11/29/2018   HGB 13.6 11/29/2018   HCT 41.6 11/29/2018   MCV 91.8 11/29/2018   PLT 275 11/29/2018   Lab Results  Component Value Date   NA 141 11/29/2018   K 4.2 11/29/2018   CL 103  11/29/2018   CO2 31 11/29/2018    RADIOGRAPHIC STUDIES: I have personally reviewed the radiological reports and agreed with the findings in the report.  ASSESSMENT AND PLAN:  Malignant neoplasm of upper-outer quadrant of right breast in female, estrogen receptor positive ([place]) 11/21/2018:Screening detected right breast mass upper outer quadrant, in addition to areas of calcifications which were not biopsied.  By  ultrasound she had 2 breast masses 12 o'clock position 2.9 cm: Grade 2 IDC with high-grade DCIS ER 100%, PR 70%, Ki-67 10%, HER-2 3+ by IHC and 11 o'clock position 1 cm, ER 90%, PR 50%, Ki-67 15%, HER-2 3+ by IHC, T2N0 stage Ib  Pathology and radiology counseling: Discussed with the patient, the details of pathology including the type of breast cancer,the clinical staging, the significance of ER, PR and HER-2/neu receptors and the implications for treatment. After reviewing the pathology in detail, we proceeded to discuss the different treatment options between surgery, radiation, chemotherapy, antiestrogen therapies.  Recommendation based on multidisciplinary tumor board: 1. Neoadjuvant chemotherapy with TCH Perjeta 6 cycles followed by Herceptin and Perjeta versus Kadcyla maintenance for 1 year 2. Followed by mastectomy versus breast conserving surgery with sentinel lymph node study 3. Followed by adjuvant radiation therapy if patient had lumpectomy 4.  Followed by adjuvant antiestrogen therapy  Chemotherapy Counseling: I discussed the risks and benefits of chemotherapy including the risks of nausea/ vomiting, risk of infection from low WBC count, fatigue due to chemo or anemia, bruising or bleeding due to low platelets, mouth sores, loss/ change in taste and decreased appetite. Liver and kidney function will be monitored through out chemotherapy as abnormalities in liver and kidney function may be a side effect of treatment. Cardiac dysfunction due to Herceptin and Perjeta were discussed in detail. Risk of permanent bone marrow dysfunction due to chemo were also discussed.  Plan: 1. Port placement 2. Echocardiogram 3. Chemotherapy class 4. Breast MRI  Patient will need a biopsy of the additional area of calcifications if she decides to undergo breast conserving surgery.  However if she decided on mastectomy she will not need this area biopsied.  Return to clinic in 2 weeks to start  chemotherapy.    All questions were answered. The patient knows to call the clinic with any problems, questions or concerns.   Nicholas Lose, MD 11/29/2018   Julious Oka Dorshimer, am acting as scribe for Nicholas Lose, MD.  I have reviewed the above documentation for accuracy and completeness, and I agree with the above.

## 2018-11-29 NOTE — Research (Signed)
[id number] UPBEAT- [id number]  This research RN was notified by Dr. Lindi Adie that patient was a potential candidate for UPBEAT study. This RN spent approximately 15 minutes going over the study and study requirements. Informed patient that trial was voluntary. Pt was given informed consent, hipaa form, UPBEAT brochure, [place] brochure and this RN's business card. Pt was encouraged to read over materials and agreed to speak to research RN on Monday 12/04/18 about her participation. Pt denied any questions at this time. Pt was thanked for her time and interest. Johney Maine RN, BSN Clinical Research  11/29/2018 [id number]

## 2018-11-29 NOTE — Assessment & Plan Note (Signed)
11/21/2018:Screening detected right breast mass upper outer quadrant, in addition to areas of calcifications which were not biopsied.  By ultrasound she had 2 breast masses 12 o'clock position 2.9 cm: Grade 2 IDC with high-grade DCIS ER 100%, PR 70%, Ki-67 10%, HER-2 3+ by IHC and 11 o'clock position 1 cm, ER 90%, PR 50%, Ki-67 15%, HER-2 3+ by IHC, T2N0 stage Ib  Pathology and radiology counseling: Discussed with the patient, the details of pathology including the type of breast cancer,the clinical staging, the significance of ER, PR and HER-2/neu receptors and the implications for treatment. After reviewing the pathology in detail, we proceeded to discuss the different treatment options between surgery, radiation, chemotherapy, antiestrogen therapies.  Recommendation based on multidisciplinary tumor board: 1. Neoadjuvant chemotherapy with TCH Perjeta 6 cycles followed by Herceptin and Perjeta versus Kadcyla maintenance for 1 year 2. Followed by mastectomy versus breast conserving surgery with sentinel lymph node study 3. Followed by adjuvant radiation therapy if patient had lumpectomy 4.  Followed by adjuvant antiestrogen therapy  Chemotherapy Counseling: I discussed the risks and benefits of chemotherapy including the risks of nausea/ vomiting, risk of infection from low WBC count, fatigue due to chemo or anemia, bruising or bleeding due to low platelets, mouth sores, loss/ change in taste and decreased appetite. Liver and kidney function will be monitored through out chemotherapy as abnormalities in liver and kidney function may be a side effect of treatment. Cardiac dysfunction due to Herceptin and Perjeta were discussed in detail. Risk of permanent bone marrow dysfunction due to chemo were also discussed.  Plan: 1. Port placement 2. Echocardiogram 3. Chemotherapy class 4. Breast MRI  Patient will need a biopsy of the additional area of calcifications if she decides to undergo breast  conserving surgery.  However if she decided on mastectomy she will not need this area biopsied.  Return to clinic in 2 weeks to start chemotherapy.  

## 2018-11-29 NOTE — Patient Instructions (Signed)

## 2018-11-29 NOTE — Progress Notes (Signed)
[facility] Psychosocial Distress Screening Spiritual Care  Met with Debbie Watts in [facility] to introduce [facility] team/resources, reviewing distress screen per protocol.  The patient scored a 5 on the Psychosocial Distress Thermometer which indicates moderate distress. Also assessed for distress and other psychosocial needs.   ONCBCN DISTRESS SCREENING 11/29/2018  Screening Type Initial Screening  Distress experienced in past week (1-10) 5  Referral to financial advocate Yes  Referral to support programs Yes   Th pt presented to [facility] with her partner and his daughter. The pt reported that since receiving additional information about her diagnosis and treatment, her distress has decreased to a 3. The pt shared that her primary concern is with how to pay for treatment. The pt reported that she is unsure how much her Medicare plan will cover. The pt was referred to the [facility] financial advocates. The pt expressed no current need for support programs, but she reported that she will reach out if any needs arise.  Follow up needed: No.  [name], Counseling Intern [phone]

## 2018-11-29 NOTE — Therapy (Signed)
[place], [place], [address] Phone: [phone]   Fax:  [phone]  Physical Therapy Evaluation  Patient Details  Name: Debbie Watts MRN: [id number] Date of Birth: [birth year] Referring Provider (PT): Dr. Excell Seltzer   Encounter Date: 11/29/2018  PT End of Session - 11/29/18 1627    Visit Number  1    Number of Visits  1    PT Start Time  1412    PT Stop Time  1437    PT Time Calculation (min)  25 min    Activity Tolerance  Patient tolerated treatment well    Behavior During Therapy  [facility] for tasks assessed/performed       Past Medical History:  Diagnosis Date  . Depression   . Deviated nasal septum   . Hearing loss 06/11/2014   Bilateral  . Herniated cervical disc   . Hypertension   . Migraine   . Miscarriage    2  . MS (multiple sclerosis) ([place])     Past Surgical History:  Procedure Laterality Date  . NASAL SEPTUM SURGERY    . spinal injections      There were no vitals filed for this visit.   Subjective Assessment - 11/29/18 1616    Subjective  Patient reports she is here today to be seen by her medical team for her newly diagnosed right breast cancer.    Patient is accompained by:  Family member    Pertinent History  Patient was diagnosed on 11/08/2018 with right triple positive grade II and III invasive ductal carcinoma breast cancer. There are 3 areas all in the upper outer quadrant: 1 cm at 11:00, 2.9 cm at 12:00, and 2 groups of calcifications. The Ki67 is 10-15%. She smokes almost 1 pack per day. She has multiple sclerosis.    Patient Stated Goals  Reduce lymphedema risk and learn post op shoulder ROM HEP    Currently in Pain?  Yes    Pain Score  4     Pain Location  Hand    Pain Orientation  Right    Pain Descriptors / Indicators  Numbness    Pain Type  Chronic pain    Pain Onset  More than a month ago    Pain Frequency  Intermittent    Aggravating Factors    Gripping things    Pain Relieving Factors  Unknown         [facility] PT Assessment - 11/29/18 0001      Assessment   Medical Diagnosis  Right breast cancer    Referring Provider (PT)  Dr. Excell Seltzer    Onset Date/Surgical Date  11/08/18    Hand Dominance  Right    Prior Therapy  none      Precautions   Precautions  Other (comment)    Precaution Comments  active cancer      Restrictions   Weight Bearing Restrictions  No      Balance Screen   Has the patient fallen in the past 6 months  No    Has the patient had a decrease in activity level because of a fear of falling?   No    Is the patient reluctant to leave their home because of a fear of falling?   No      Home Environment   Living Environment  Private residence    Living Arrangements  Spouse/significant other    Available Help at Discharge  Family  Prior Function   Level of Independence  Independent    Vocation  On disability   Due to MS   Leisure  She does not exercise      Cognition   Overall Cognitive Status  Within Functional Limits for tasks assessed      Posture/Postural Control   Posture/Postural Control  Postural limitations    Postural Limitations  Rounded Shoulders;Forward head      ROM / Strength   AROM / PROM / Strength  AROM;Strength      AROM   AROM Assessment Site  Shoulder;Cervical    Right/Left Shoulder  Right;Left    Right Shoulder Extension  121 Degrees    Right Shoulder Flexion  155 Degrees    Right Shoulder ABduction  66 Degrees    Right Shoulder Internal Rotation  74 Degrees    Right Shoulder Horizontal  ADduction  58 Degrees    Left Shoulder Extension  149 Degrees    Left Shoulder Flexion  157 Degrees    Left Shoulder ABduction  65 Degrees    Left Shoulder Internal Rotation  77 Degrees    Cervical Flexion  WNL    Cervical Extension  WNL    Cervical - Right Side Bend  WNL    Cervical - Left Side Bend  WNL    Cervical - Right Rotation  WNL    Cervical - Left Rotation   WNL      Strength   Overall Strength  Within functional limits for tasks performed        LYMPHEDEMA/ONCOLOGY QUESTIONNAIRE - 11/29/18 1624      Type   Cancer Type  Right breast      Lymphedema Assessments   Lymphedema Assessments  Upper extremities      Right Upper Extremity Lymphedema   10 cm Proximal to Olecranon Process  26.8 cm    Olecranon Process  23.5 cm    10 cm Proximal to Ulnar Styloid Process  21.4 cm    Just Proximal to Ulnar Styloid Process  14.5 cm    Across Hand at [employer]  18.1 cm    At Force of 2nd Digit  6.4 cm      Left Upper Extremity Lymphedema   10 cm Proximal to Olecranon Process  27 cm    Olecranon Process  23.6 cm    10 cm Proximal to Ulnar Styloid Process  20.7 cm    Just Proximal to Ulnar Styloid Process  15 cm    Across Hand at [employer]  18.2 cm    At [place] of 2nd Digit  6.5 cm          Quick Dash - 11/29/18 0001    Open a tight or new jar  Moderate difficulty    Do heavy household chores (wash walls, wash floors)  Severe difficulty    Carry a shopping bag or briefcase  Mild difficulty    Wash your back  Moderate difficulty    Use a knife to cut food  No difficulty    Recreational activities in which you take some force or impact through your arm, shoulder, or hand (golf, hammering, tennis)  Mild difficulty    During the past week, to what extent has your arm, shoulder or hand problem interfered with your normal social activities with family, friends, neighbors, or groups?  Modererately    During the past week, to what extent has your arm, shoulder or hand problem limited  your work or other regular daily activities  Slightly    Arm, shoulder, or hand pain.  Moderate    Tingling (pins and needles) in your arm, shoulder, or hand  Severe    Difficulty Sleeping  Mild difficulty    DASH Score  40.91 %        Objective measurements completed on examination: See above findings.     Patient was instructed today in a home  exercise program today for post op shoulder range of motion. These included active assist shoulder flexion in sitting, scapular retraction, wall walking with shoulder abduction, and hands behind head external rotation.  She was encouraged to do these twice a day, holding 3 seconds and repeating 5 times when permitted by her physician.     PT Education - 11/29/18 1625    Education Details  Lymphedema risk reduction and post op shoulder ROM HEP    Person(s) Educated  Patient;Caregiver(s)   Boyfriend   Methods  Explanation;Demonstration;Handout    Comprehension  Returned demonstration;Verbalized understanding           Breast Clinic Goals - 11/29/18 1634      Patient will be able to verbalize understanding of pertinent lymphedema risk reduction practices relevant to her diagnosis specifically related to skin care.   Time  1    Period  Days    Status  Achieved      Patient will be able to return demonstrate and/or verbalize understanding of the post-op home exercise program related to regaining shoulder range of motion.   Time  1    Period  Days    Status  Achieved      Patient will be able to verbalize understanding of the importance of attending the postoperative After Breast Cancer Class for further lymphedema risk reduction education and therapeutic exercise.   Time  1    Period  Days    Status  Achieved            Plan - 11/29/18 1628    Clinical Impression Statement  Patient was diagnosed on 11/08/2018 with right triple positive grade II and III invasive ductal carcinoma breast cancer. There are 3 areas all in the upper outer quadrant: 1 cm at 11:00, 2.9 cm at 12:00, and 2 groups of calcifications. The Ki67 is 10-15%. She smokes almost 1 pack per day. She has multiple sclerosis. Her multidisciplinary medical team met prior to her assessments to determine a recommended treatment plan. She is planning to have neoadjuvant chemotherapy followed by either a lumpectomy or  mastectomy with a sentinel node biopsy, possible radiation (if lumpectomy), and anti-estrogen therapy. She will benefit from a post op PT reassessment to determine needs.    History and Personal Factors relevant to plan of care:  MS; has right hand deficits from MS    Clinical Presentation  Stable    Clinical Decision Making  Low    Rehab Potential  Good    Clinical Impairments Affecting Rehab Potential  Comorbidities    PT Frequency  One time visit    PT Treatment/Interventions  ADLs/Self Care Home Management;Patient/family education;Therapeutic exercise    PT Next Visit Plan  Will reassess post op if MD refers    PT Home Exercise Plan  Post op shoulder ROM HEP    Consulted and Agree with Plan of Care  Patient;Family member/caregiver    Family Member Consulted  Boyfriend and his daughter       Patient will benefit from skilled therapeutic  intervention in order to improve the following deficits and impairments:  Decreased range of motion, Impaired UE functional use, Pain, Postural dysfunction, Decreased knowledge of precautions  Visit Diagnosis: Malignant neoplasm of upper-outer quadrant of right breast in female, estrogen receptor positive ([place]) - Plan: PT plan of care cert/re-cert  Abnormal posture - Plan: PT plan of care cert/re-cert  Stiffness of right shoulder, not elsewhere classified - Plan: PT plan of care cert/re-cert   Patient will follow up at outpatient cancer rehab 3-4 weeks following surgery.  If the patient requires physical therapy at that time, a specific plan will be dictated and sent to the referring physician for approval. The patient was educated today on appropriate basic range of motion exercises to begin post operatively and the importance of attending the After Breast Cancer class following surgery.  Patient was educated today on lymphedema risk reduction practices as it pertains to recommendations that will benefit the patient immediately following surgery.  She  verbalized good understanding.     Problem List Patient Active Problem List   Diagnosis Date Noted  . Malignant neoplasm of upper-outer quadrant of right breast in female, estrogen receptor positive ([place]) 11/27/2018  . Neurogenic claudication due to lumbar spinal stenosis 09/19/2018  . Urinary tract infection without hematuria 08/10/2018  . Chronic left-sided thoracic back pain 07/20/2018  . Tendon nodule 04/21/2018  . Solar lentigo 04/21/2018  . Preventative health care 04/20/2018  . Acute cystitis without hematuria 03/16/2018  . Gait disturbance 01/12/2018  . Polyuria 12/08/2017  . Easy bruising 08/31/2017  . Chronic obstructive pulmonary disease ([place]) 11/30/2016  . Essential hypertension 01/26/2016  . Migraine headache 06/23/2014  . Multiple sclerosis ([place]) 06/23/2014  . Paresthesias 06/23/2014  . Restless leg syndrome 06/23/2014  . Hearing loss, bilateral 06/11/2014  . Major depression, recurrent, chronic ([place]) 06/11/2014  . Cervical herniated disc 06/11/2014  . Cigarette nicotine dependence without complication 47/15/9539  . Lumbar herniated disc 06/11/2014  . Perimenopausal symptoms 06/11/2014   Annia Friendly, PT 11/29/18 4:36 PM  [place], [place], [address] Phone: [phone]   Fax:  [phone]  Name: Madysen Faircloth MRN: [id number] Date of Birth: 06/26/1961

## 2018-11-29 NOTE — Progress Notes (Signed)
Radiation Oncology         [phone] ________________________________  Name: Debbie Watts        MRN: [id number]  Date of Service: 11/29/2018 DOB: 1961/09/04  [id number], Sheppard Coil, MD  Neva Seat, MD     REFERRING PHYSICIAN: Neva Seat, MD   DIAGNOSIS: The encounter diagnosis was Malignant neoplasm of upper-outer quadrant of right breast in female, estrogen receptor positive ([place]).   Cancer Staging Malignant neoplasm of upper-outer quadrant of right breast in female, estrogen receptor positive ([place]) Staging form: Breast, AJCC 8th Edition - Clinical stage from 11/29/2018: Stage IB (cT2, cN0, cM0, G3, ER+, PR+, HER2+) - Signed by Nicholas Lose, MD on 11/29/2018  Stage , T2, N0, M0, Right Breast, UOQ, Invasive Ductal Carcinoma, ER (+), PR (+), HER2 (+), grade 3   HISTORY OF PRESENT ILLNESS: Debbie Watts is a 58 y.o. female seen in the multidisciplinary breast clinic for a new diagnosis of right breast cancer. The patient was noted to have no symptoms at the time of diagnosis.  She had routine screening mammography on 11/09/2018 showing a possible abnormality in the right breast. She underwent bilateral diagnostic mammography with tomography and right breast ultrasonography on 11/17/2018 showing: Suspicious palpable right breast mass at 12 o'clock position measures 2.9 cm maximum diameter. Suspicious 1.0 cm right breast mass at 11 o'clock position 4-5 cm from the nipple. Two small groups of microcalcifications in the upper-outer right breast, separate from the mass. These are nonspecific, but ductal carcinoma in situ can not be excluded. Complicated cyst 4:65 position right breast.  Accordingly on 11/21/2018 she proceeded to biopsy of the right breast area in question. The pathology from this procedure showed: 1. Breast, right, needle core biopsy, 12 o'clock with invasive ductal carcinoma. Ductal carcinoma in situ. Grade 2. Prognostic indicators significant for: ER, 100%  positive and PR, 70% positive, both with strong staining intensity. Proliferation marker Ki67 at 10%. HER2 positive. 2. Breast, right, needle core biopsy, 11 o'clock with invasive ductal carcinoma. Calcifications. Grade 3. Prognostic indicators significant for: ER, 90% positive and PR, 50% positive, both with strong staining intensity. Proliferation marker Ki67 at 15%. HER2 positive.      PREVIOUS RADIATION THERAPY: No   PAST MEDICAL HISTORY:  Past Medical History:  Diagnosis Date  . Depression   . Deviated nasal septum   . Hearing loss 06/11/2014   Bilateral  . Herniated cervical disc   . Hypertension   . Migraine   . Miscarriage    2  . MS (multiple sclerosis) ([place])        PAST SURGICAL HISTORY: Past Surgical History:  Procedure Laterality Date  . NASAL SEPTUM SURGERY    . spinal injections       FAMILY HISTORY:  Family History  Adopted: Yes  Problem Relation Age of Onset  . Cancer Maternal Uncle        throat     SOCIAL HISTORY:  reports that she has been smoking cigarettes. She has a 22.00 pack-year smoking history. She has never used smokeless tobacco. She reports current alcohol use. She reports current drug use. Drug: Marijuana.   ALLERGIES: Procaine   MEDICATIONS:  Current Outpatient Medications  Medication Sig Dispense Refill  . Acetaminophen-Caffeine (TENSION HEADACHE RELIEF PO) Take by mouth.    . betamethasone valerate lotion (VALISONE) 0.1 % APPLY 1 APPLICATION TOPICALLY 2 TIMES DAILY.    . carbidopa-levodopa (SINEMET IR) 10-100 MG tablet TAKE 1 TABLET BY MOUTH AT BEDTIME. 30 tablet 5  .  cyclobenzaprine (FLEXERIL) 10 MG tablet TAKE 1 TABLET BY MOUTH EVERYDAY AT BEDTIME 30 tablet 1  . dexamethasone (DECADRON) 4 MG tablet Take 1 tablet (4 mg total) by mouth daily. Take 1 tablet day before chemo and 1 tablet day after chemo with food 12 tablet 0  . diclofenac (VOLTAREN) 75 MG EC tablet TAKE 1 TABLET BY MOUTH TWICE A DAY 60 tablet 5  . Dimethyl Fumarate  240 MG CPDR Take 1 capsule (240 mg total) by mouth 2 (two) times daily. 180 capsule 3  . eletriptan (RELPAX) 40 MG tablet Take 40 mg by mouth as needed for migraine or headache. One tablet by mouth at onset of headache. May repeat in 2 hours if headache persists or recurs.    . ferrous sulfate 325 (65 FE) MG tablet Take 325 mg by mouth daily.    Marland Kitchen gabapentin (NEURONTIN) 300 MG capsule TAKE 2 CAPSULES (600 MG TOTAL) BY MOUTH 2 (TWO) TIMES DAILY. 360 capsule 1  . lidocaine-prilocaine (EMLA) cream Apply to affected area once 30 g 3  . lisinopril (PRINIVIL,ZESTRIL) 5 MG tablet TAKE 1 TABLET BY MOUTH EVERY DAY 90 tablet 3  . LORazepam (ATIVAN) 0.5 MG tablet Take 1 tablet (0.5 mg total) by mouth at bedtime as needed (Nausea or vomiting). 30 tablet 0  . ondansetron (ZOFRAN) 8 MG tablet Take 1 tablet (8 mg total) by mouth 2 (two) times daily as needed (Nausea or vomiting). Begin 4 days after chemotherapy. 30 tablet 1  . oxybutynin (DITROPAN) 5 MG tablet TAKE 1 TABLET BY MOUTH TWICE A DAY (Patient not taking: Reported on 11/29/2018) 180 tablet 1  . PARoxetine (PAXIL) 20 MG tablet Take 1 tablet (20 mg total) by mouth daily. 90 tablet 3  . prochlorperazine (COMPAZINE) 10 MG tablet Take 1 tablet (10 mg total) by mouth every 6 (six) hours as needed (Nausea or vomiting). 30 tablet 1  . Tiotropium Bromide Monohydrate 1.25 MCG/ACT AERS Inhale into the lungs.    . traMADol (ULTRAM) 50 MG tablet TAKE 1 TABLET (50 MG TOTAL) BY MOUTH 2 (TWO) TIMES DAILY AS NEEDED. (Patient not taking: Reported on 11/29/2018) 60 tablet 0  . traMADol (ULTRAM) 50 MG tablet Take 1 tablet (50 mg total) by mouth 2 (two) times daily as needed. 60 tablet 0  . VENTOLIN HFA 108 (90 Base) MCG/ACT inhaler Inhale 1-2 puffs into the lungs every 6 (six) hours as needed for wheezing or shortness of breath. 1 Inhaler 3   No current facility-administered medications for this encounter.      REVIEW OF SYSTEMS: On review of systems, the patient reports  that she is doing well overall. She denies any chest pain, shortness of breath, cough, fevers, chills, night sweats, unintended weight changes. She denies any bowel or bladder disturbances, and denies abdominal pain, nausea or vomiting. She denies any new musculoskeletal or joint aches or pains. A complete review of systems is obtained and is otherwise negative.     PHYSICAL EXAM:  Wt Readings from Last 3 Encounters:  11/29/18 138 lb 12.8 oz (63 kg)  09/19/18 141 lb 8 oz (64.2 kg)  08/10/18 144 lb (65.3 kg)   Temp Readings from Last 3 Encounters:  11/29/18 98.5 F (36.9 C) (Oral)  08/10/18 98.1 F (36.7 C) (Oral)  07/20/18 98.1 F (36.7 C) (Oral)   BP Readings from Last 3 Encounters:  11/29/18 135/83  09/19/18 121/80  08/10/18 (!) 146/85   Pulse Readings from Last 3 Encounters:  11/29/18 82  09/19/18  89  08/10/18 75     In general this is a well appearing caucasian female in no acute distress. She is alert and oriented x4 and appropriate throughout the examination. HEENT reveals that the patient is normocephalic, atraumatic. EOMs are intact. Skin is intact without any evidence of gross lesions. Cardiovascular exam reveals a regular rate and rhythm, no clicks rubs or murmurs are auscultated. Chest is clear to auscultation bilaterally. Lymphatic assessment is performed and does not reveal any adenopathy in the cervical, supraclavicular, axillary, or inguinal chains. Bilateral breast exam is performed and reveals underlying mass in the UOQ with some bruising. No palpable masses in axilla bilaterally. Abdomen has active bowel sounds in all quadrants and is intact. The abdomen is soft, non tender, non distended. Lower extremities are negative for pretibial pitting edema, deep calf tenderness, cyanosis or clubbing.   ECOG = 0  0 - Asymptomatic (Fully active, able to carry on all predisease activities without restriction)  1 - Symptomatic but completely ambulatory (Restricted in  physically strenuous activity but ambulatory and able to carry out work of a light or sedentary nature. For example, light housework, office work)  2 - Symptomatic, <50% in bed during the day (Ambulatory and capable of all self care but unable to carry out any work activities. Up and about more than 50% of waking hours)  3 - Symptomatic, >50% in bed, but not bedbound (Capable of only limited self-care, confined to bed or chair 50% or more of waking hours)  4 - Bedbound (Completely disabled. Cannot carry on any self-care. Totally confined to bed or chair)  5 - Death   Eustace Pen MM, Creech RH, Tormey DC, et al. [phone]). "Toxicity and response criteria of the [facility]". [place] Oncol. 5 (6): 649-55    LABORATORY DATA:  Lab Results  Component Value Date   WBC 6.8 11/29/2018   HGB 13.6 11/29/2018   HCT 41.6 11/29/2018   MCV 91.8 11/29/2018   PLT 275 11/29/2018   Lab Results  Component Value Date   NA 141 11/29/2018   K 4.2 11/29/2018   CL 103 11/29/2018   CO2 31 11/29/2018   Lab Results  Component Value Date   ALT 51 (H) 11/29/2018   AST 28 11/29/2018   ALKPHOS 84 11/29/2018   BILITOT 0.2 (L) 11/29/2018      RADIOGRAPHY: Mammogram Digital Screening  Result Date: 11/09/2018 CLINICAL DATA:  Screening. EXAM: DIGITAL SCREENING BILATERAL MAMMOGRAM WITH CAD COMPARISON:  Previous exam(s). ACR Breast Density Category c: The breast tissue is heterogeneously dense, which may obscure small masses. FINDINGS: In the right breast, a possible mass warrants further evaluation. In the left breast, no findings suspicious for malignancy. Images were processed with CAD. IMPRESSION: Further evaluation is suggested for possible mass in the right breast. RECOMMENDATION: Diagnostic mammogram and possibly ultrasound of the right breast. (Code:[id number]) The patient will be contacted regarding the findings, and additional imaging will be scheduled. BI-RADS CATEGORY  0: Incomplete.  Need additional imaging evaluation and/or prior mammograms for comparison. Electronically Signed   By: Dorise Bullion III M.D   On: 11/09/2018 15:36   US Breast Ltd Uni Right Inc Axilla  Result Date: 11/20/2018 CLINICAL DATA:  58 year old patient recalled from recent 2D screening mammogram for evaluation of a possible right breast mass. EXAM: DIGITAL DIAGNOSTIC RIGHT MAMMOGRAM WITH CAD AND TOMO ULTRASOUND RIGHT BREAST COMPARISON:  November 08, 2018 and earlier priors from an outside facility performed in 2017 and 2015. ACR  Breast Density Category c: The breast tissue is heterogeneously dense, which may obscure small masses. FINDINGS: Whole breast CC and MLO views with tomography confirm an irregular and spiculated mass with multiple internal pleomorphic calcifications. The mass is in the 11 o'clock position, mid to posterior thirds, and measures approximately 2 cm greatest diameter. In the anterior third of the upper outer right breast are 2 separate small groups of heterogeneous microcalcifications evaluated with magnification views. Both of these groups of calcifications span approximately 3 mm. Mammographic images were processed with CAD. On physical exam, there is a firm palpable approximately 2 cm mass at 12 o'clock position in the right breast 4 cm from the nipple. Targeted ultrasound is performed, showing an irregular hypoechoic mass with angulated margins and internal vascular flow at 12 o'clock position approximately 4 cm from the nipple that measures 2.0 x 1.4 x 2.9 cm. Echogenicities within the mass are consistent with calcifications seen at mammography. Approximately 1 cm from this dominant mass, in the 11 o'clock position 4 to 5 cm from the nipple is a slightly hypoechoic irregular mass measuring 1.0 x 0.7 x 0.8 cm. There is internal vascular flow. At 9:30 position is an intramammary lymph node measuring 0.8 cm. At 9:30 position 3 cm from the nipple is a parallel circumscribed oval hypoechoic mass  with posterior acoustic enhancement that measures 1.3 x 0.5 x 1.3 cm and has no internal vascular flow. This is favored to be a benign complicated cyst with some internal septation. Ultrasound of the right axilla is negative for lymphadenopathy. IMPRESSION: 1. Suspicious palpable right breast mass at 12 o'clock position measures 2.9 cm maximum diameter. 2. Suspicious 1.0 cm right breast mass at 11 o'clock position 4-5 cm from the nipple. 3. Two small groups of microcalcifications in the upper-outer right breast, separate from the mass. These are nonspecific, but ductal carcinoma in situ can not be excluded. 4. Complicated cyst 8:54 position right breast. RECOMMENDATION: Two ultrasound-guided biopsies are recommended and have been scheduled for the patient. These biopsies include the palpable mass at 12 o'clock position and the non- palpable mass at 11 o'clock position. If either of these masses are malignant, then breast MRI is suggested for evaluation of extent of disease, and to help determine what additional biopsy(ies) may be warranted to evaluate for extent of disease. If no additional suspicious areas are identified on breast MRI, then 2 stereotactic biopsies, of the small groups of calcifications, may be warranted. I have discussed the findings and recommendations with the patient. Results were also provided in writing at the conclusion of the visit. If applicable, a reminder letter will be sent to the patient regarding the next appointment. BI-RADS CATEGORY  5: Highly suggestive of malignancy. Electronically Signed   By: Curlene Dolphin M.D.   On: 11/20/2018 08:24   Mm Diag Breast Tomo Uni Right  Result Date: 11/20/2018 CLINICAL DATA:  58 year old patient recalled from recent 2D screening mammogram for evaluation of a possible right breast mass. EXAM: DIGITAL DIAGNOSTIC RIGHT MAMMOGRAM WITH CAD AND TOMO ULTRASOUND RIGHT BREAST COMPARISON:  November 08, 2018 and earlier priors from an outside facility  performed in 2017 and 2015. ACR Breast Density Category c: The breast tissue is heterogeneously dense, which may obscure small masses. FINDINGS: Whole breast CC and MLO views with tomography confirm an irregular and spiculated mass with multiple internal pleomorphic calcifications. The mass is in the 11 o'clock position, mid to posterior thirds, and measures approximately 2 cm greatest diameter. In the anterior third  of the upper outer right breast are 2 separate small groups of heterogeneous microcalcifications evaluated with magnification views. Both of these groups of calcifications span approximately 3 mm. Mammographic images were processed with CAD. On physical exam, there is a firm palpable approximately 2 cm mass at 12 o'clock position in the right breast 4 cm from the nipple. Targeted ultrasound is performed, showing an irregular hypoechoic mass with angulated margins and internal vascular flow at 12 o'clock position approximately 4 cm from the nipple that measures 2.0 x 1.4 x 2.9 cm. Echogenicities within the mass are consistent with calcifications seen at mammography. Approximately 1 cm from this dominant mass, in the 11 o'clock position 4 to 5 cm from the nipple is a slightly hypoechoic irregular mass measuring 1.0 x 0.7 x 0.8 cm. There is internal vascular flow. At 9:30 position is an intramammary lymph node measuring 0.8 cm. At 9:30 position 3 cm from the nipple is a parallel circumscribed oval hypoechoic mass with posterior acoustic enhancement that measures 1.3 x 0.5 x 1.3 cm and has no internal vascular flow. This is favored to be a benign complicated cyst with some internal septation. Ultrasound of the right axilla is negative for lymphadenopathy. IMPRESSION: 1. Suspicious palpable right breast mass at 12 o'clock position measures 2.9 cm maximum diameter. 2. Suspicious 1.0 cm right breast mass at 11 o'clock position 4-5 cm from the nipple. 3. Two small groups of microcalcifications in the  upper-outer right breast, separate from the mass. These are nonspecific, but ductal carcinoma in situ can not be excluded. 4. Complicated cyst 2:77 position right breast. RECOMMENDATION: Two ultrasound-guided biopsies are recommended and have been scheduled for the patient. These biopsies include the palpable mass at 12 o'clock position and the non- palpable mass at 11 o'clock position. If either of these masses are malignant, then breast MRI is suggested for evaluation of extent of disease, and to help determine what additional biopsy(ies) may be warranted to evaluate for extent of disease. If no additional suspicious areas are identified on breast MRI, then 2 stereotactic biopsies, of the small groups of calcifications, may be warranted. I have discussed the findings and recommendations with the patient. Results were also provided in writing at the conclusion of the visit. If applicable, a reminder letter will be sent to the patient regarding the next appointment. BI-RADS CATEGORY  5: Highly suggestive of malignancy. Electronically Signed   By: Curlene Dolphin M.D.   On: 11/20/2018 08:24   Mm Clip Placement Right  Result Date: 11/21/2018 CLINICAL DATA:  Post biopsy mammogram of the right breast for clip placement. EXAM: DIAGNOSTIC RIGHT MAMMOGRAM POST ULTRASOUND BIOPSY COMPARISON:  Previous exam(s). FINDINGS: Mammographic images were obtained following ultrasound guided biopsy of 2 right breast masses. The ribbon shaped biopsy marking clip is positioned at the posterosuperior margin of the targeted mass. The coil shaped biopsy marking clip is positioned at the site of the second biopsy in the upper outer right breast. The 2 clips are approximately 2.5 cm apart. IMPRESSION: Appropriate positioning of the ribbon shaped biopsy marking clip at the site of the right breast mass at 12 o'clock. Appropriate positioning of the coil shaped biopsy marking clip at the site of the mass at 11 o'clock in the right breast. The  2 clips lie at 2.5 cm apart. Final Assessment: Post Procedure Mammograms for Marker Placement Electronically Signed   By: Ammie Ferrier M.D.   On: 11/21/2018 16:23   [place] Rt Breast Bx W Loc Dev 1st Lesion  Img Bx Spec US Guide  Addendum Date: 11/22/2018   ADDENDUM REPORT: 11/22/2018 14:20 ADDENDUM: Pathology revealed GRADE II INVASIVE DUCTAL CARCINOMA, HIGH GRADE DUCTAL CARCINOMA IN SITU of the Right breast, 12 o'clock. GRADE III INVASIVE DUCTAL CARCINOMA, CALCIFICATIONS of the Right breast, 11 o'clock. This was found to be concordant by Dr. Ammie Ferrier. Pathology results were discussed with the patient by telephone. The patient reported doing well after the biopsies with tenderness at the sites. Post biopsy instructions and care were reviewed and questions were answered. The patient was encouraged to call [facility] for any additional concerns. The patient was referred to [facility] on November 29, 2018. A bilateral breast MRI is suggested for evaluation of extent of disease, and to help determine what additional biopsy(ies) may be warranted to evaluate for extent of disease, if breast conservation will be considered. If no additional suspicious areas are identified on breast MRI, then 2 stereotactic biopsies, of the small groups of calcifications, may be warranted. Pathology results reported by Terie Purser, RN on 11/22/2018. Electronically Signed   By: Ammie Ferrier M.D.   On: 11/22/2018 14:20   Result Date: 11/22/2018 CLINICAL DATA:  58 year old female presenting for ultrasound-guided biopsies of the right breast. EXAM: ULTRASOUND GUIDED RIGHT BREAST CORE NEEDLE BIOPSY COMPARISON:  Previous exam(s). FINDINGS: I met with the patient and we discussed the procedure of ultrasound-guided biopsy, including benefits and alternatives. We discussed the high likelihood of a successful procedure. We  discussed the risks of the procedure, including infection, bleeding, tissue injury, clip migration, and inadequate sampling. Informed written consent was given. The usual time-out protocol was performed immediately prior to the procedure. #1 Lesion quadrant: Upper outer quadrant Using sterile technique and 1% Lidocaine as local anesthetic, under direct ultrasound visualization, a 14 gauge spring-loaded device was used to perform biopsy of a mass in the right breast 12 o'clock using an inferolateral approach. At the conclusion of the procedure a ribbon shaped tissue marker clip was deployed into the biopsy cavity. -------------------------------------------------------------------------------------------------------------------------------------------- #2 Lesion quadrant: Upper-outer quadrant Using sterile technique and 1% Lidocaine as local anesthetic, under direct ultrasound visualization, a 14 gauge spring-loaded device was used to perform biopsy of a mass in the right breast at 11 o'clock using a lateral approach through the same incision site as the biopsy above. At the conclusion of the procedure a coil shaped tissue marker clip was deployed into the biopsy cavity. Follow up 2 view mammogram was performed and dictated separately. IMPRESSION: 1. Ultrasound guided biopsy of a right breast mass at 12 o'clock. No apparent complications. 2. Ultrasound guided biopsy of a right breast mass at 11 o'clock. No apparent complications. Electronically Signed: By: Ammie Ferrier M.D. On: 11/21/2018 16:10   [place] Rt Breast Bx W Loc Dev Ea Add Lesion Img Bx Spec US Guide  Addendum Date: 11/22/2018   ADDENDUM REPORT: 11/22/2018 14:20 ADDENDUM: Pathology revealed GRADE II INVASIVE DUCTAL CARCINOMA, HIGH GRADE DUCTAL CARCINOMA IN SITU of the Right breast, 12 o'clock. GRADE III INVASIVE DUCTAL CARCINOMA, CALCIFICATIONS of the Right breast, 11 o'clock. This was found to be concordant by Dr. Ammie Ferrier. Pathology results  were discussed with the patient by telephone. The patient reported doing well after the biopsies with tenderness at the sites. Post biopsy instructions and care were reviewed and questions were answered. The patient was encouraged to call [facility] for any additional concerns. The patient  [facility] on November 29, 2018. A bilateral breast MRI is suggested for evaluation of extent of disease, and to help determine what additional biopsy(ies) may be warranted to evaluate for extent of disease, if breast conservation will be considered. If no additional suspicious areas are identified on breast MRI, then 2 stereotactic biopsies, of the small groups of calcifications, may be warranted. Pathology results reported by Terie Purser, RN on 11/22/2018. Electronically Signed   By: Ammie Ferrier M.D.   On: 11/22/2018 14:20   Result Date: 11/22/2018 CLINICAL DATA:  58 year old female presenting for ultrasound-guided biopsies of the right breast. EXAM: ULTRASOUND GUIDED RIGHT BREAST CORE NEEDLE BIOPSY COMPARISON:  Previous exam(s). FINDINGS: I met with the patient and we discussed the procedure of ultrasound-guided biopsy, including benefits and alternatives. We discussed the high likelihood of a successful procedure. We discussed the risks of the procedure, including infection, bleeding, tissue injury, clip migration, and inadequate sampling. Informed written consent was given. The usual time-out protocol was performed immediately prior to the procedure. #1 Lesion quadrant: Upper outer quadrant Using sterile technique and 1% Lidocaine as local anesthetic, under direct ultrasound visualization, a 14 gauge spring-loaded device was used to perform biopsy of a mass in the right breast 12 o'clock using an inferolateral approach. At the conclusion of the procedure a ribbon shaped tissue marker clip was  deployed into the biopsy cavity. -------------------------------------------------------------------------------------------------------------------------------------------- #2 Lesion quadrant: Upper-outer quadrant Using sterile technique and 1% Lidocaine as local anesthetic, under direct ultrasound visualization, a 14 gauge spring-loaded device was used to perform biopsy of a mass in the right breast at 11 o'clock using a lateral approach through the same incision site as the biopsy above. At the conclusion of the procedure a coil shaped tissue marker clip was deployed into the biopsy cavity. Follow up 2 view mammogram was performed and dictated separately. IMPRESSION: 1. Ultrasound guided biopsy of a right breast mass at 12 o'clock. No apparent complications. 2. Ultrasound guided biopsy of a right breast mass at 11 o'clock. No apparent complications. Electronically Signed: By: Ammie Ferrier M.D. On: 11/21/2018 16:10       IMPRESSION/PLAN: 1. Stage T2, N0, M0, Right Breast, UOQ, Invasive Ductal Carcinoma, ER (+), PR (+), HER2 (+), grade 3   Per Dr. Sharee Pimple' recommendations, the patient isn't a typical breast conservation candidate and mastectomy would be optimal, however, if the patient wishes to have breast conservation, then the patient would need a MRI of the bilateral breasts, along with biopsies of the areas of calcifications in the UOQ. However, if on MRI, there are numerous areas of concern, then mastectomy would be discussed with the patient again at that time. Patient is aware of this. The patient notes that she would wish to proceed with breast conservation at this time.   Due to requesting breast conservation at this time, the patient will proceed with neoadjuvant chemotherapy with Medical Oncologist, Dr. Lindi Adie with echocardiogram and chemo class prior. However, if the patient does proceed with a mastectomy, then at this time, radiation therapy wouldn't be optimal treatment, however, this  will be further discussed at that time following MRI.   Discussed that if the patient were to proceed with breast conservation with a lumpectomy, then she would have  radiation therapy, then she would receive 6.5 weeks of radiation to the whole breast to prevent locoregional recurrence. Patient advised that the risks are but not limited to, skin irritation and  mild fatigue towards the end of radiation treatments. Discussed with the patient that any and all side effects from radiation will be monitored and that the patien twill be given a cream during radiation treatment to help with skin irritation. Advised that the skin irritation will occur 2-3 weeks into radiation treatment and that we will monitor it as needed. Discussed that if the patient were to have radiation, then it would be completed 4 weeks s/p lumpectomy with a simulation appointment prior. She will receive 6.5 weeks of radiation to the right breast to prevent locoregional recurrence.   Advised use of annual mammogram after completion of treatment and stressed the importance of this.   Patient will follow up with Dr. Excell Seltzer and I will be consulted if radiation therapy will be needed and if so, then I will see the patient back in the office for a simulation appointment. She knows to call with any questions or concerns.     ------------------------------------------------  Jodelle Gross, MD, PhD   This document serves as a record of services personally performed by Kyung Rudd, MD. It was created on his behalf by Steva Colder, a trained medical scribe. The creation of this record is based on the scribe's personal observations and the provider's statements to them. This document has been checked and approved by the attending provider.

## 2018-11-29 NOTE — Progress Notes (Signed)
START ON PATHWAY REGIMEN - Breast     A cycle is every 21 days:     Pertuzumab      Pertuzumab      Trastuzumab-xxxx      Trastuzumab-xxxx      Carboplatin      Docetaxel   **Always confirm dose/schedule in your pharmacy ordering system**  Patient Characteristics: Preoperative or Nonsurgical Candidate (Clinical Staging), Neoadjuvant Therapy followed by Surgery, Invasive Disease, Chemotherapy, HER2 Positive, ER Positive Therapeutic Status: Preoperative or Nonsurgical Candidate (Clinical Staging) AJCC M Category: cM0 AJCC Grade: G3 Breast Surgical Plan: Neoadjuvant Therapy followed by Surgery ER Status: Positive (+) AJCC 8 Stage Grouping: IB HER2 Status: Positive (+) AJCC T Category: cT2 AJCC N Category: cN0 PR Status: Positive (+) Intent of Therapy: Curative Intent, Discussed with Patient 

## 2018-11-30 ENCOUNTER — Telehealth: Payer: Self-pay | Admitting: Hematology and Oncology

## 2018-11-30 NOTE — Telephone Encounter (Signed)
No los °

## 2018-12-03 ENCOUNTER — Ambulatory Visit
Admission: RE | Admit: 2018-12-03 | Discharge: 2018-12-03 | Disposition: A | Discharge status: Home / Self Care | Payer: Medicare HMO | Source: Ambulatory Visit | Attending: Hematology and Oncology | Admitting: Hematology and Oncology

## 2018-12-03 DIAGNOSIS — C50411 Malignant neoplasm of upper-outer quadrant of right female breast: Secondary | ICD-10-CM

## 2018-12-03 DIAGNOSIS — D0511 Intraductal carcinoma in situ of right breast: Secondary | ICD-10-CM | POA: Diagnosis not present

## 2018-12-03 DIAGNOSIS — Z170 Estrogen receptor positive status [ER+]: Principal | ICD-10-CM

## 2018-12-03 IMAGING — MR MR BILATERAL BREAST WITHOUT AND WITH CONTRAST
8 of 12 series · 32 of 48 positions shown · IV contrast (6CC GADOVIST)
Comparison: Previous exam(s).

Addendum:
CLINICAL DATA: Biopsy proven invasive ductal carcinoma and ductal
carcinoma in-situ in the 12 o'clock region of the right breast.
Biopsy proven invasive ductal carcinoma in the 11 o'clock region of
the right breast.

LABS:  None obtained at the time of imaging.
EXAM:
BILATERAL BREAST MRI WITH AND WITHOUT CONTRAST
TECHNIQUE: Multiplanar, multisequence MR images of both breasts were obtained
prior to and following the intravenous administration of 6 ml of
Gadavist

[Series 2: t2_tirm_tra ipat (a-p) · axial · 3.0mm · 0.66mm/px · 1 of 55 slices shown]
[im 1/55]
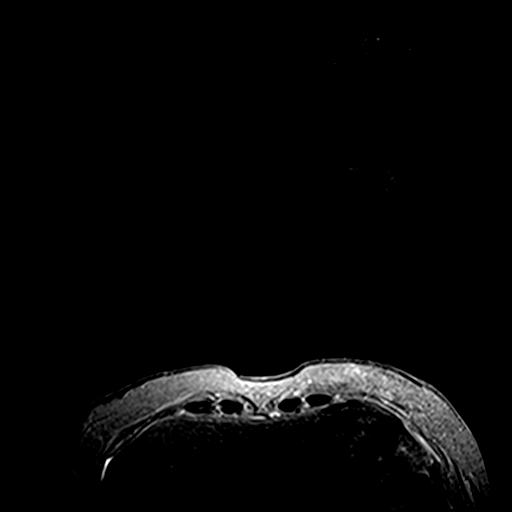

[Series 3: fl3d pre-cm no · axial · non-contrast · 1.2mm · 0.89mm/px · z∈[-69,+103]mm · 5 of 144 slices shown]
[im 1/144]
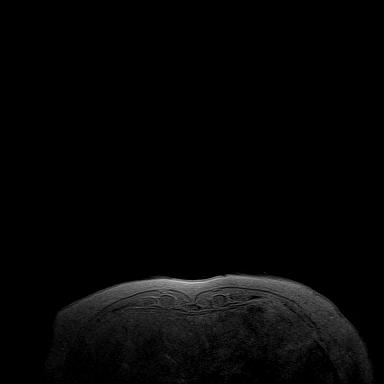
[im 36/144]
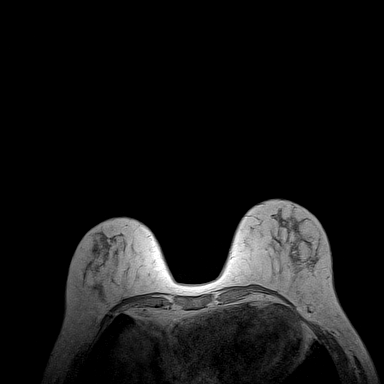
[im 72/144]
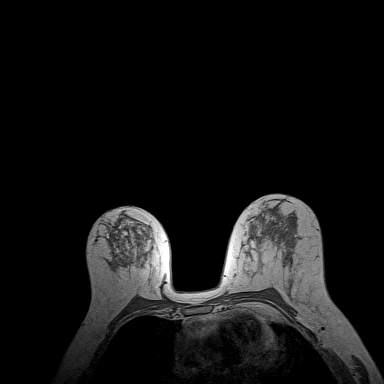
[im 108/144]
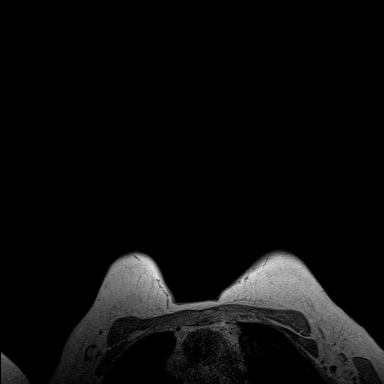
[im 144/144]
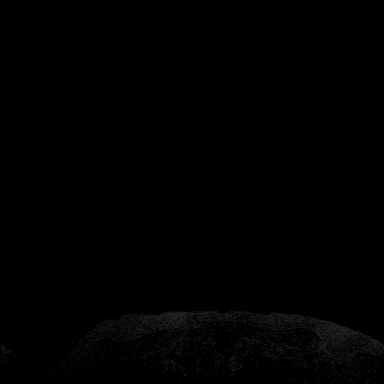

[Series 4: fl3d pre-cm · axial · non-contrast · 1.2mm · 0.89mm/px · z∈[-69,+103]mm · 5 of 144 slices shown]
[im 1/144]
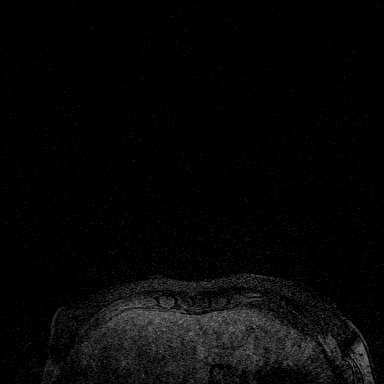
[im 36/144]
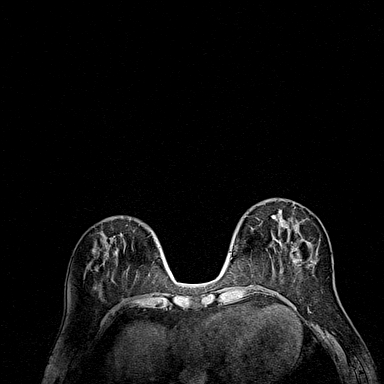
[im 72/144]
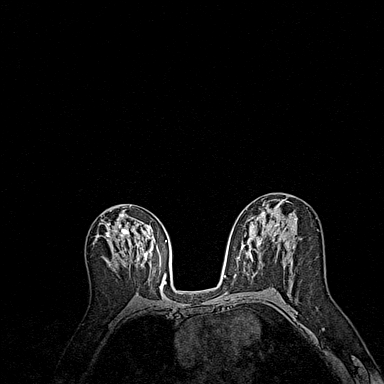
[im 108/144]
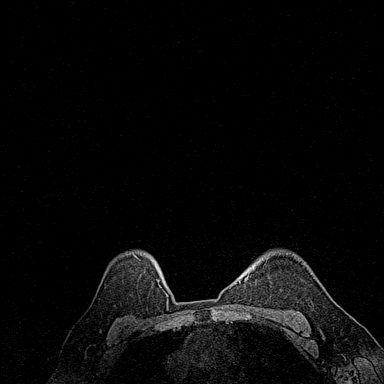
[im 144/144]
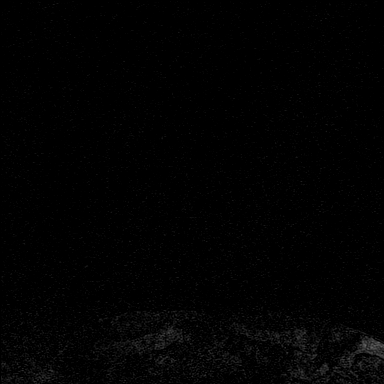

[Series 5: fl3d post-cm 20 · axial · 1.2mm · 0.89mm/px · z∈[-69,+103]mm · 5 of 144 slices shown (1 of 3)]
[im 1/144]
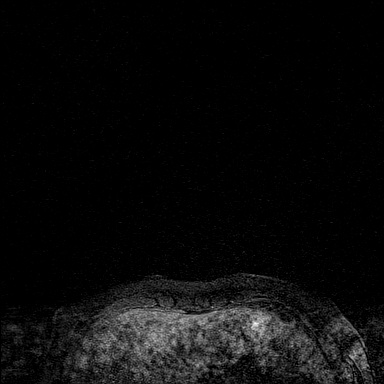
[im 36/144]
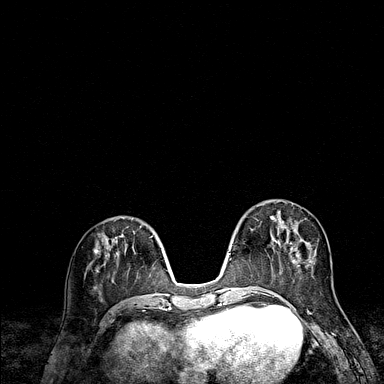
[im 72/144]
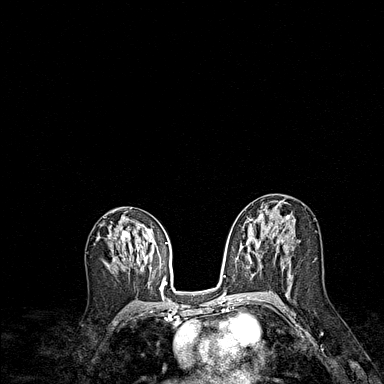
[im 108/144]
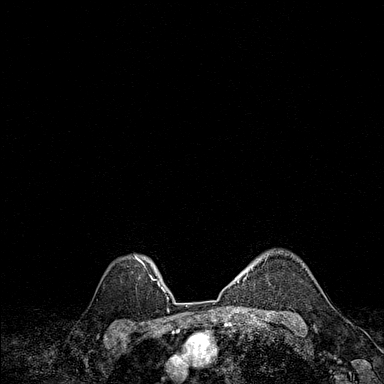
[im 144/144]
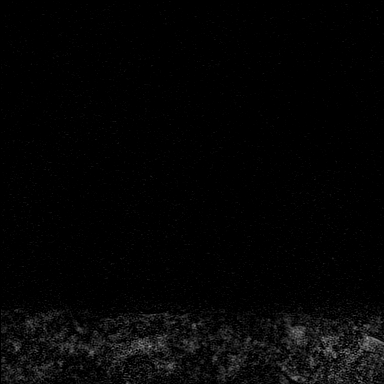

[Series 6: fl3d post-cm 20 · axial · 1.2mm · 0.89mm/px · z∈[-69,+103]mm · 5 of 144 slices shown (2 of 3)]
[im 1/144]
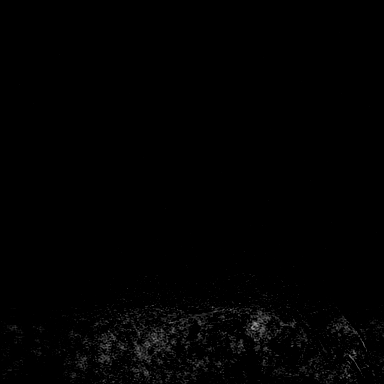
[im 36/144]
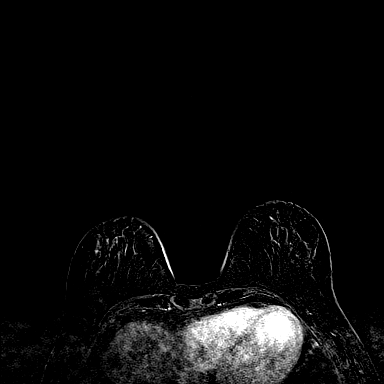
[im 72/144]
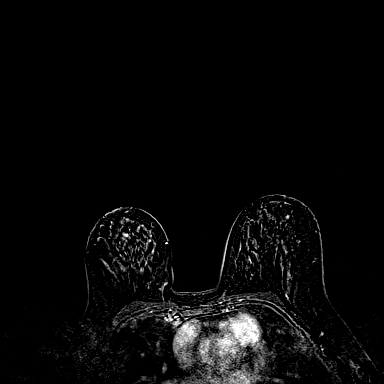
[im 108/144]
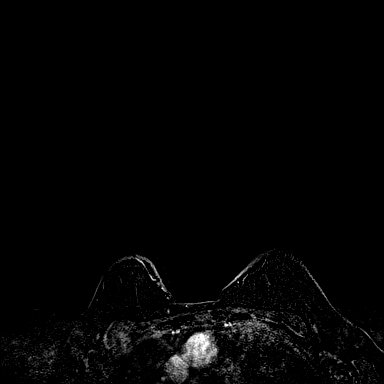
[im 144/144]
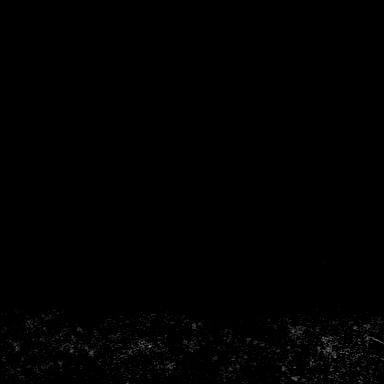

[Series 7: fl3d post-cm 20 · axial · 172.8mm · 0.89mm/px · 1 of 1 slices shown (3 of 3)]
[im 1/1]
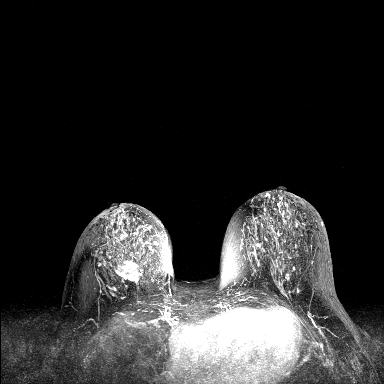

[Series 8: fl3d post-cm 3min · axial · 1.2mm · 0.89mm/px · z∈[-69,+103]mm · 6 of 144 slices shown]
[im 1/144]
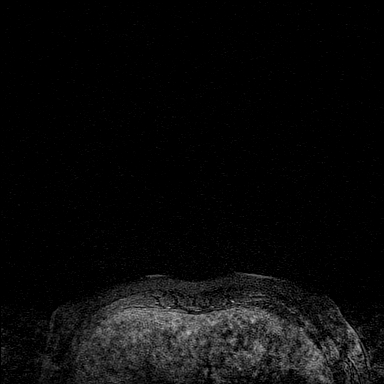
[im 29/144]
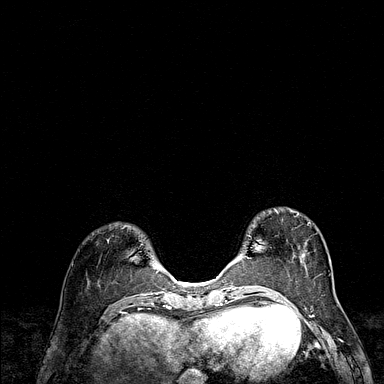
[im 58/144]
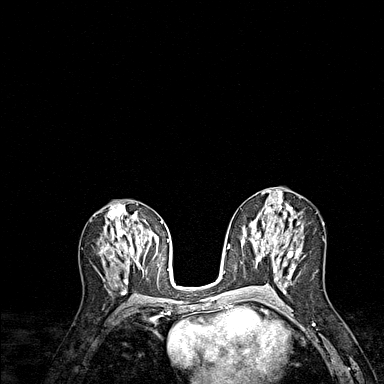
[im 86/144]
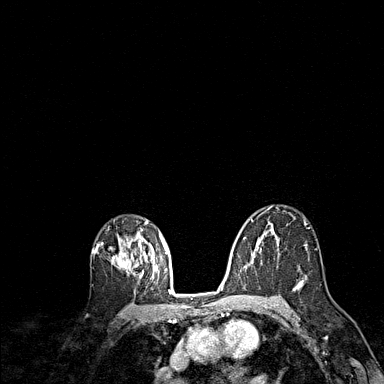
[im 115/144]
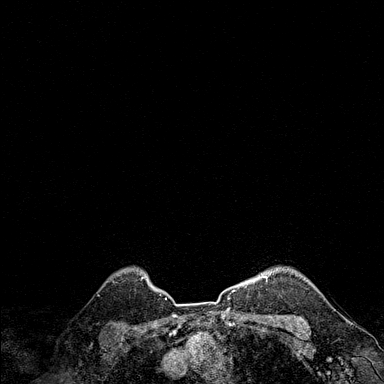
[im 144/144]
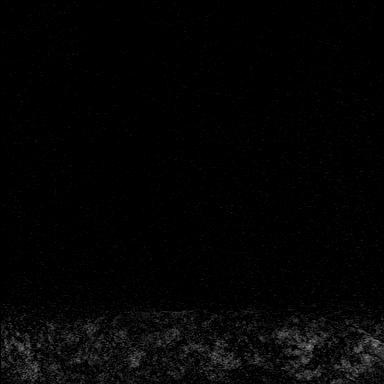

[Series 9: fl3d post-cm 3min_sub · axial · 1.2mm · 0.89mm/px · z∈[-69,+33]mm · 4 of 144 slices shown]
[im 1/144]
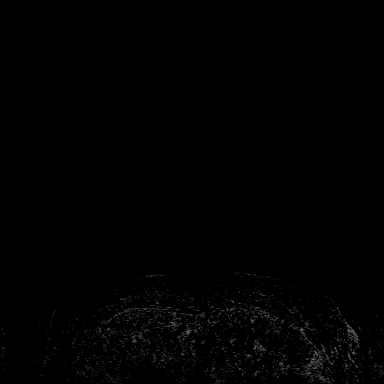
[im 29/144]
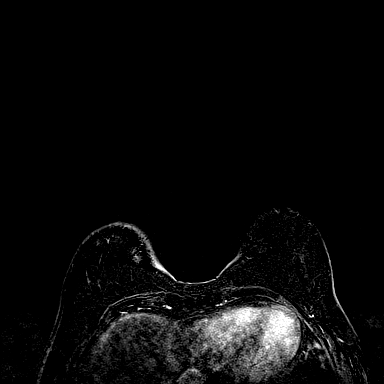
[im 58/144]
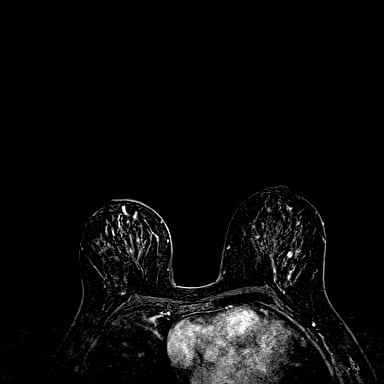
[im 86/144]
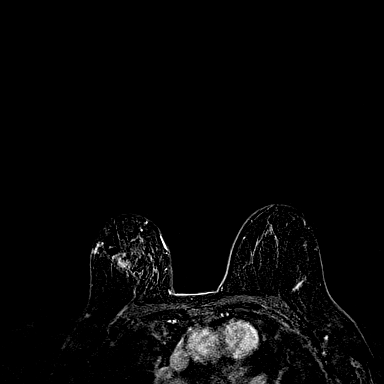

[32 of 48 positions shown; findings below may reference images not displayed]

Three-dimensional MR images were rendered by post-processing of the
original MR data on an independent workstation. The
three-dimensional MR images were interpreted, and findings are
reported in the following complete MRI report for this study. Three
dimensional images were evaluated at the independent [employer]
workstation
FINDINGS: Breast composition: c. Heterogeneous fibroglandular tissue.

Background parenchymal enhancement: Moderate.

Right breast: There is enhancing 2.1 x 2.3 x 1.7 cm mass in the 12
o'clock region of the right breast. There is a signal void artifact
in the mass corresponding with the biopsy clip. Signal void artifact
and post biopsy change is seen lateral to the enhancing mass from
the biopsied mass at 11 o'clock. There is no discrete mass in this
area.

Left breast: No mass or abnormal enhancement.

Lymph nodes: No abnormal appearing lymph nodes.

Ancillary findings:  None.
IMPRESSION: 2.3 cm enhancing mass in the 12 o'clock region of the right breast
corresponding with the biopsy proven invasive ductal carcinoma and
ductal carcinoma in-situ. Signal void artifact and post biopsy
change is seen in the 11 o'clock region of the right breast where
invasive ductal carcinoma was also biopsied. There is no discrete
mass in this area.

RECOMMENDATION:
Treatment planning of the 2 biopsy proven malignancies in the right
breast is recommended.

BI-RADS CATEGORY  6: Known biopsy-proven malignancy.

ADDENDUM:
Diagnostic report dated [DATE] stated that if no additional
areas seen on the patient's MRI than 2 stereotactic biopsies of
calcifications in the right breast is recommended.

*** End of Addendum ***

## 2018-12-03 MED ORDER — GADOBUTROL 1 MMOL/ML IV SOLN
6.0000 mL | Freq: Once | INTRAVENOUS | Status: AC | PRN
  Administered 2018-12-03: 6 mL via INTRAVENOUS

## 2018-12-04 ENCOUNTER — Other Ambulatory Visit: Payer: Self-pay | Admitting: Internal Medicine

## 2018-12-05 ENCOUNTER — Telehealth: Payer: Self-pay

## 2018-12-06 ENCOUNTER — Other Ambulatory Visit: Payer: Self-pay | Admitting: General Surgery

## 2018-12-06 ENCOUNTER — Inpatient Hospital Stay: Payer: Medicare HMO

## 2018-12-06 ENCOUNTER — Ambulatory Visit (HOSPITAL_COMMUNITY)
Admission: RE | Admit: 2018-12-06 | Discharge: 2018-12-06 | Disposition: A | Discharge status: Home / Self Care | Payer: Medicare HMO | Source: Ambulatory Visit | Attending: Hematology and Oncology | Admitting: Hematology and Oncology

## 2018-12-06 DIAGNOSIS — I341 Nonrheumatic mitral (valve) prolapse: Secondary | ICD-10-CM | POA: Insufficient documentation

## 2018-12-06 DIAGNOSIS — C50411 Malignant neoplasm of upper-outer quadrant of right female breast: Secondary | ICD-10-CM | POA: Diagnosis not present

## 2018-12-06 DIAGNOSIS — R921 Mammographic calcification found on diagnostic imaging of breast: Secondary | ICD-10-CM

## 2018-12-06 DIAGNOSIS — Z170 Estrogen receptor positive status [ER+]: Secondary | ICD-10-CM | POA: Diagnosis not present

## 2018-12-06 DIAGNOSIS — J449 Chronic obstructive pulmonary disease, unspecified: Secondary | ICD-10-CM | POA: Insufficient documentation

## 2018-12-06 DIAGNOSIS — I340 Nonrheumatic mitral (valve) insufficiency: Secondary | ICD-10-CM | POA: Insufficient documentation

## 2018-12-06 DIAGNOSIS — G35 Multiple sclerosis: Secondary | ICD-10-CM | POA: Diagnosis not present

## 2018-12-06 DIAGNOSIS — I10 Essential (primary) hypertension: Secondary | ICD-10-CM | POA: Diagnosis not present

## 2018-12-06 NOTE — Telephone Encounter (Signed)
Refill approved.

## 2018-12-07 ENCOUNTER — Telehealth: Payer: Self-pay | Admitting: *Deleted

## 2018-12-07 NOTE — Telephone Encounter (Signed)
  Oncology Nurse Navigator Documentation  Navigator Location: [facility] (12/07/18 1400)   )Navigator Encounter Type: Telephone;[facility] Follow-up (12/07/18 1400) Telephone: Outgoing Call;Clinic/[facility] Follow-up (12/07/18 1400)                                                  Time Spent with Patient: 15 (12/07/18 1400)

## 2018-12-08 ENCOUNTER — Other Ambulatory Visit: Payer: Self-pay | Admitting: Neurology

## 2018-12-08 ENCOUNTER — Telehealth: Payer: Self-pay | Admitting: Hematology and Oncology

## 2018-12-08 DIAGNOSIS — C50411 Malignant neoplasm of upper-outer quadrant of right female breast: Secondary | ICD-10-CM | POA: Diagnosis not present

## 2018-12-08 NOTE — Telephone Encounter (Signed)
Called patient per 2/13 sch message - patient daughter in law - took down appts dates and time for patient .

## 2018-12-11 ENCOUNTER — Other Ambulatory Visit: Payer: Self-pay

## 2018-12-11 ENCOUNTER — Telehealth: Payer: Self-pay | Admitting: Neurology

## 2018-12-11 ENCOUNTER — Encounter: Payer: Self-pay | Admitting: Hematology and Oncology

## 2018-12-11 DIAGNOSIS — C50411 Malignant neoplasm of upper-outer quadrant of right female breast: Secondary | ICD-10-CM

## 2018-12-11 DIAGNOSIS — Z170 Estrogen receptor positive status [ER+]: Principal | ICD-10-CM

## 2018-12-11 NOTE — Progress Notes (Signed)
Orders placed for port placement.

## 2018-12-11 NOTE — Progress Notes (Signed)
Called pt to introduce myself as her Arboriculturist and to discuss the [employer].  Left msg requesting she return my call at her earliest convenience.  If I don't hear from her today I will plan to meet her on 12/12/18.

## 2018-12-12 ENCOUNTER — Inpatient Hospital Stay: Payer: Medicare HMO

## 2018-12-12 ENCOUNTER — Encounter: Payer: Self-pay | Admitting: Hematology and Oncology

## 2018-12-12 ENCOUNTER — Telehealth: Payer: Self-pay

## 2018-12-12 ENCOUNTER — Inpatient Hospital Stay (HOSPITAL_BASED_OUTPATIENT_CLINIC_OR_DEPARTMENT_OTHER): Payer: Medicare HMO | Admitting: Hematology and Oncology

## 2018-12-12 DIAGNOSIS — Z808 Family history of malignant neoplasm of other organs or systems: Secondary | ICD-10-CM | POA: Diagnosis not present

## 2018-12-12 DIAGNOSIS — Z5112 Encounter for antineoplastic immunotherapy: Secondary | ICD-10-CM | POA: Diagnosis not present

## 2018-12-12 DIAGNOSIS — Z170 Estrogen receptor positive status [ER+]: Secondary | ICD-10-CM | POA: Diagnosis not present

## 2018-12-12 DIAGNOSIS — Z5189 Encounter for other specified aftercare: Secondary | ICD-10-CM | POA: Diagnosis not present

## 2018-12-12 DIAGNOSIS — Z720 Tobacco use: Secondary | ICD-10-CM | POA: Diagnosis not present

## 2018-12-12 DIAGNOSIS — Z5111 Encounter for antineoplastic chemotherapy: Secondary | ICD-10-CM | POA: Diagnosis not present

## 2018-12-12 DIAGNOSIS — C50411 Malignant neoplasm of upper-outer quadrant of right female breast: Secondary | ICD-10-CM | POA: Diagnosis not present

## 2018-12-12 LAB — CBC WITH DIFFERENTIAL (CANCER CENTER ONLY)
Abs Immature Granulocytes: 0.03 10*3/uL (ref 0.00–0.07)
Basophils Absolute: 0.1 10*3/uL (ref 0.0–0.1)
Basophils Relative: 1 %
Eosinophils Absolute: 0.1 10*3/uL (ref 0.0–0.5)
Eosinophils Relative: 2 %
HCT: 43.4 % (ref 36.0–46.0)
Hemoglobin: 14.3 g/dL (ref 12.0–15.0)
IMMATURE GRANULOCYTES: 1 %
Lymphocytes Relative: 13 %
Lymphs Abs: 0.8 10*3/uL (ref 0.7–4.0)
MCH: 30.1 pg (ref 26.0–34.0)
MCHC: 32.9 g/dL (ref 30.0–36.0)
MCV: 91.4 fL (ref 80.0–100.0)
Monocytes Absolute: 0.6 10*3/uL (ref 0.1–1.0)
Monocytes Relative: 10 %
NEUTROS PCT: 73 %
NRBC: 0 % (ref 0.0–0.2)
Neutro Abs: 4.7 10*3/uL (ref 1.7–7.7)
Platelet Count: 290 10*3/uL (ref 150–400)
RBC: 4.75 MIL/uL (ref 3.87–5.11)
RDW: 13.2 % (ref 11.5–15.5)
WBC Count: 6.3 10*3/uL (ref 4.0–10.5)

## 2018-12-12 LAB — CMP (CANCER CENTER ONLY)
ALT: 35 U/L (ref 0–44)
AST: 20 U/L (ref 15–41)
Albumin: 4.1 g/dL (ref 3.5–5.0)
Alkaline Phosphatase: 81 U/L (ref 38–126)
Anion gap: 8 (ref 5–15)
BUN: 14 mg/dL (ref 6–20)
CO2: 29 mmol/L (ref 22–32)
Calcium: 8.9 mg/dL (ref 8.9–10.3)
Chloride: 105 mmol/L (ref 98–111)
Creatinine: 0.78 mg/dL (ref 0.44–1.00)
GFR, Est AFR Am: >60 mL/min (ref >60)
GFR, Estimated: >60 mL/min (ref >60)
Glucose, Bld: 93 mg/dL (ref 70–99)
Potassium: 4.3 mmol/L (ref 3.5–5.1)
Sodium: 142 mmol/L (ref 135–145)
Total Bilirubin: 0.5 mg/dL (ref 0.3–1.2)
Total Protein: 6.9 g/dL (ref 6.5–8.1)

## 2018-12-12 NOTE — Assessment & Plan Note (Addendum)
11/21/2018:Screening detected right breast mass upper outer quadrant, in addition to areas of calcifications which were not biopsied.  By ultrasound she had 2 breast masses 12 o'clock position 2.9 cm: Grade 2 IDC with high-grade DCIS ER 100%, PR 70%, Ki-67 10%, HER-2 3+ by IHC and 11 o'clock position 1 cm, ER 90%, PR 50%, Ki-67 15%, HER-2 3+ by IHC, T2N0 stage Ib  Recommendation: 1. Neoadjuvant chemotherapy with TCH Perjeta 6 cycles followed by Herceptin and Perjeta versus Kadcyla maintenance for 1 year 2. Followed by mastectomy versus breast conserving surgery with sentinel lymph node study 3. Followed by adjuvant radiation therapy if patient had lumpectomy 4.  Followed by adjuvant antiestrogen therapy  Breast MRI 12/03/2018: 2.3 cm enhancing mass 4 o'clock position right breast, 11 o'clock position biopsy-proven malignancy area did not show any discrete mass -------------------------------------------------------------------------------------------------------------------------------------------------------- Current treatment: Cycle 1 day 1 TCH Perjeta to start 12/13/2018 Labs reviewed Echocardiogram: 12/06/2018: EF greater than 65% Chemo plus completed Chemo consent obtained  Return to clinic in 1 week for toxicity check

## 2018-12-12 NOTE — Telephone Encounter (Signed)
Spoke with patient regarding port placement. Pt received call from [facility] IR dept to schedule appt for port insertion. Pt states that she had it done last week with Dr.Hoxworth. Will obtain port placement final report from [facility]. Called IR and notified them to cancel port insertion order. IR aware.

## 2018-12-12 NOTE — Progress Notes (Signed)
Patient Care Team: Neva Seat, MD as PCP - General (Internal Medicine) Excell Seltzer, MD as Consulting Physician (General Surgery) Nicholas Lose, MD as Consulting Physician (Hematology and Oncology) Kyung Rudd, MD as Consulting Physician (Radiation Oncology)  DIAGNOSIS:    ICD-10-CM   1. Malignant neoplasm of upper-outer quadrant of right breast in female, estrogen receptor positive ([place]) C50.411    Z17.0     SUMMARY OF ONCOLOGIC HISTORY:   Malignant neoplasm of upper-outer quadrant of right breast in female, estrogen receptor positive ([place])   11/21/2018 Initial Diagnosis    Screening detected right breast mass upper outer quadrant, in addition to areas of calcifications which were not biopsied.  By ultrasound she had 2 breast masses 12 o'clock position 2.9 cm: Grade 2 IDC with high-grade DCIS ER 100%, PR 70%, Ki-67 10%, HER-2 3+ by IHC and 11 o'clock position 1 cm, ER 90%, PR 50%, Ki-67 15%, HER-2 3+ by IHC, T2N0 stage Ib    11/29/2018 Cancer Staging    Staging form: Breast, AJCC 8th Edition - Clinical stage from 11/29/2018: Stage IB (cT2, cN0, cM0, G3, ER+, PR+, HER2+) - Signed by Nicholas Lose, MD on 11/29/2018    12/12/2018 -  Chemotherapy    The patient had palonosetron (ALOXI) injection 0.25 mg, 0.25 mg, Intravenous,  Once, 0 of 6 cycles pegfilgrastim-cbqv (UDENYCA) injection 6 mg, 6 mg, Subcutaneous, Once, 0 of 6 cycles trastuzumab (HERCEPTIN) 504 mg in sodium chloride 0.9 % 250 mL chemo infusion, 8 mg/kg = 504 mg, Intravenous,  Once, 0 of 6 cycles CARBOplatin (PARAPLATIN) 620 mg in sodium chloride 0.9 % 250 mL chemo infusion, 620 mg (100 % of original dose 618.6 mg), Intravenous,  Once, 0 of 6 cycles Dose modification:   (original dose 618.6 mg, Cycle 1) DOCEtaxel (TAXOTERE) 120 mg in sodium chloride 0.9 % 250 mL chemo infusion, 75 mg/m2 = 120 mg, Intravenous,  Once, 0 of 6 cycles pertuzumab (PERJETA) 420 mg in sodium chloride 0.9 % 250 mL chemo infusion, 420 mg (100 %  of original dose 420 mg), Intravenous, Once, 0 of 6 cycles Dose modification: 420 mg (original dose 420 mg, Cycle 1, Reason: Provider Judgment) fosaprepitant (EMEND) 150 mg, dexamethasone (DECADRON) 12 mg in sodium chloride 0.9 % 145 mL IVPB, , Intravenous,  Once, 0 of 6 cycles  for chemotherapy treatment.      CHIEF COMPLIANT: Cycle 1 TCHP to begin tomorrow  INTERVAL HISTORY: Debbie Watts is a 58 y.o. with above-mentioned history of right breast cancer. An ECHO on 12/06/18 showed an ejection fraction >65%. A breast MRI on 12/03/18 confirmed the 2.3cm mass in the right breast with calcifications and changes at the site of the other biopsied mass. She presents to the clinic alone today and notes she is anxious about treatment. She notes constant migraines and asked how often should could take Excedrin. Her labs from today are WNL. She reviewed her medication list with me.   REVIEW OF SYSTEMS:   Constitutional: Denies fevers, chills or abnormal weight loss (+) migraines  Eyes: Denies blurriness of vision Ears, nose, mouth, throat, and face: Denies mucositis or sore throat Respiratory: Denies cough, dyspnea or wheezes Cardiovascular: Denies palpitation, chest discomfort Gastrointestinal: Denies nausea, heartburn or change in bowel habits Skin: Denies abnormal skin rashes Lymphatics: Denies new lymphadenopathy or easy bruising Neurological: Denies numbness, tingling or new weaknesses Behavioral/Psych: (+) anxiety Extremities: No lower extremity edema Breast: denies any pain or lumps or nodules in either breasts All other systems were reviewed  with the patient and are negative.  I have reviewed the past medical history, past surgical history, social history and family history with the patient and they are unchanged from previous note.  ALLERGIES:  is allergic to procaine.  MEDICATIONS:  Current Outpatient Medications  Medication Sig Dispense Refill  . Acetaminophen-Caffeine (TENSION  HEADACHE RELIEF PO) Take by mouth.    . betamethasone valerate lotion (VALISONE) 0.1 % APPLY 1 APPLICATION TOPICALLY 2 TIMES DAILY.    . carbidopa-levodopa (SINEMET IR) 10-100 MG tablet TAKE 1 TABLET BY MOUTH AT BEDTIME. 30 tablet 5  . cyclobenzaprine (FLEXERIL) 10 MG tablet TAKE 1 TABLET BY MOUTH EVERYDAY AT BEDTIME 30 tablet 1  . dexamethasone (DECADRON) 4 MG tablet Take 1 tablet (4 mg total) by mouth daily. Take 1 tablet day before chemo and 1 tablet day after chemo with food 12 tablet 0  . diclofenac (VOLTAREN) 75 MG EC tablet TAKE 1 TABLET BY MOUTH TWICE A DAY 60 tablet 5  . Dimethyl Fumarate 240 MG CPDR Take 1 capsule (240 mg total) by mouth 2 (two) times daily. 180 capsule 3  . eletriptan (RELPAX) 40 MG tablet Take 40 mg by mouth as needed for migraine or headache. One tablet by mouth at onset of headache. May repeat in 2 hours if headache persists or recurs.    . ferrous sulfate 325 (65 FE) MG tablet Take 325 mg by mouth daily.    Marland Kitchen gabapentin (NEURONTIN) 300 MG capsule TAKE 2 CAPSULES (600 MG TOTAL) BY MOUTH 2 (TWO) TIMES DAILY. 360 capsule 1  . lidocaine-prilocaine (EMLA) cream Apply to affected area once 30 g 3  . lisinopril (PRINIVIL,ZESTRIL) 5 MG tablet TAKE 1 TABLET BY MOUTH EVERY DAY 90 tablet 3  . LORazepam (ATIVAN) 0.5 MG tablet Take 1 tablet (0.5 mg total) by mouth at bedtime as needed (Nausea or vomiting). 30 tablet 0  . ondansetron (ZOFRAN) 8 MG tablet Take 1 tablet (8 mg total) by mouth 2 (two) times daily as needed (Nausea or vomiting). Begin 4 days after chemotherapy. 30 tablet 1  . PARoxetine (PAXIL) 20 MG tablet Take 1 tablet (20 mg total) by mouth daily. 90 tablet 3  . prochlorperazine (COMPAZINE) 10 MG tablet Take 1 tablet (10 mg total) by mouth every 6 (six) hours as needed (Nausea or vomiting). 30 tablet 1  . Tiotropium Bromide Monohydrate 1.25 MCG/ACT AERS Inhale into the lungs.    . traMADol (ULTRAM) 50 MG tablet TAKE 1 TABLET BY MOUTH TWICE A DAY AS NEEDED 60 tablet 0   . VENTOLIN HFA 108 (90 Base) MCG/ACT inhaler Inhale 1-2 puffs into the lungs every 6 (six) hours as needed for wheezing or shortness of breath. 1 Inhaler 3   No current facility-administered medications for this visit.     PHYSICAL EXAMINATION: ECOG PERFORMANCE STATUS: 1 - Symptomatic but completely ambulatory  Vitals:   12/12/18 1048  BP: (!) 144/82  Pulse: 82  Resp: 19  Temp: 98.4 F (36.9 C)  SpO2: 99%   Filed Weights   12/12/18 1048  Weight: 142 lb 6.4 oz (64.6 kg)    GENERAL: alert, no distress and comfortable SKIN: skin color, texture, turgor are normal, no rashes or significant lesions EYES: normal, Conjunctiva are pink and non-injected, sclera clear OROPHARYNX: no exudate, no erythema and lips, buccal mucosa, and tongue normal  NECK: supple, thyroid normal size, non-tender, without nodularity LYMPH: no palpable lymphadenopathy in the cervical, axillary or inguinal LUNGS: clear to auscultation and percussion with normal breathing  effort HEART: regular rate & rhythm and no murmurs and no lower extremity edema ABDOMEN: abdomen soft, non-tender and normal bowel sounds MUSCULOSKELETAL: no cyanosis of digits and no clubbing  NEURO: alert & oriented x 3 with fluent speech, no focal motor/sensory deficits EXTREMITIES: No lower extremity edema  LABORATORY DATA:  I have reviewed the data as listed CMP Latest Ref Rng & Units 12/12/2018 11/29/2018 08/31/2017  Glucose 70 - 99 mg/dL 93 97 87  BUN 6 - 20 mg/dL '14 17 17  ' Creatinine 0.44 - 1.00 mg/dL 0.78 0.74 0.73  Sodium 135 - 145 mmol/L 142 141 140  Potassium 3.5 - 5.1 mmol/L 4.3 4.2 4.6  Chloride 98 - 111 mmol/L 105 103 102  CO2 22 - 32 mmol/L '29 31 28  ' Calcium 8.9 - 10.3 mg/dL 8.9 9.5 8.8  Total Protein 6.5 - 8.1 g/dL 6.9 7.0 6.4  Total Bilirubin 0.3 - 1.2 mg/dL 0.5 0.2(L) <0.2  Alkaline Phos 38 - 126 U/L 81 84 107  AST 15 - 41 U/L '20 28 28  ' ALT 0 - 44 U/L 35 51(H) 48(H)    Lab Results  Component Value Date   WBC 6.3  12/12/2018   HGB 14.3 12/12/2018   HCT 43.4 12/12/2018   MCV 91.4 12/12/2018   PLT 290 12/12/2018   NEUTROABS 4.7 12/12/2018    ASSESSMENT & PLAN:  Malignant neoplasm of upper-outer quadrant of right breast in female, estrogen receptor positive ([place]) 11/21/2018:Screening detected right breast mass upper outer quadrant, in addition to areas of calcifications which were not biopsied.  By ultrasound she had 2 breast masses 12 o'clock position 2.9 cm: Grade 2 IDC with high-grade DCIS ER 100%, PR 70%, Ki-67 10%, HER-2 3+ by IHC and 11 o'clock position 1 cm, ER 90%, PR 50%, Ki-67 15%, HER-2 3+ by IHC, T2N0 stage Ib  Recommendation: 1. Neoadjuvant chemotherapy with TCH Perjeta 6 cycles followed by Herceptin and Perjeta versus Kadcyla maintenance for 1 year 2. Followed by mastectomy versus breast conserving surgery with sentinel lymph node study 3. Followed by adjuvant radiation therapy if patient had lumpectomy 4.  Followed by adjuvant antiestrogen therapy  Breast MRI 12/03/2018: 2.3 cm enhancing mass 4 o'clock position right breast, 11 o'clock position biopsy-proven malignancy area did not show any discrete mass -------------------------------------------------------------------------------------------------------------------------------------------------------- Current treatment: Cycle 1 day 1 TCH Perjeta to start 12/13/2018 Labs reviewed Echocardiogram: 12/06/2018: EF greater than 65% Chemo plus completed Chemo consent obtained  Return to clinic in 1 week for toxicity check     No orders of the defined types were placed in this encounter.  The patient has a good understanding of the overall plan. she agrees with it. she will call with any problems that may develop before the next visit here.  Nicholas Lose, MD 12/12/2018  Julious Oka Dorshimer am acting as scribe for Dr. Nicholas Lose.  I have reviewed the above documentation for accuracy and completeness, and I agree with the  above.

## 2018-12-12 NOTE — Progress Notes (Signed)
Met w/ pt to introduce myself as her Arboriculturist.  Unfortunately there aren't any foundations offering copay assistance for her Dx and the type of ins she has.  I offered the [employer], went over what it covers, gave her an expense sheet and the income requirement.  She would like to apply so she will bring proof of income on 12/13/18.  She also stated she may need assistance w/ transportation so I referred her to Ebony Hail to assist w/ transportation.  She has my card for any questions or concerns she may have in the future.

## 2018-12-13 ENCOUNTER — Encounter: Payer: Self-pay | Admitting: *Deleted

## 2018-12-13 ENCOUNTER — Telehealth: Payer: Self-pay | Admitting: Hematology and Oncology

## 2018-12-13 ENCOUNTER — Inpatient Hospital Stay: Payer: Medicare HMO

## 2018-12-13 VITALS — BP 131/91 | HR 85 | Temp 98.8°F | Resp 18

## 2018-12-13 DIAGNOSIS — C50411 Malignant neoplasm of upper-outer quadrant of right female breast: Secondary | ICD-10-CM

## 2018-12-13 DIAGNOSIS — Z5111 Encounter for antineoplastic chemotherapy: Secondary | ICD-10-CM | POA: Diagnosis not present

## 2018-12-13 DIAGNOSIS — Z720 Tobacco use: Secondary | ICD-10-CM | POA: Diagnosis not present

## 2018-12-13 DIAGNOSIS — Z170 Estrogen receptor positive status [ER+]: Principal | ICD-10-CM

## 2018-12-13 DIAGNOSIS — Z5189 Encounter for other specified aftercare: Secondary | ICD-10-CM | POA: Diagnosis not present

## 2018-12-13 DIAGNOSIS — Z5112 Encounter for antineoplastic immunotherapy: Secondary | ICD-10-CM | POA: Diagnosis not present

## 2018-12-13 DIAGNOSIS — Z808 Family history of malignant neoplasm of other organs or systems: Secondary | ICD-10-CM | POA: Diagnosis not present

## 2018-12-13 MED ORDER — SODIUM CHLORIDE 0.9 % IV SOLN
75.0000 mg/m2 | Freq: Once | INTRAVENOUS | Status: AC
  Administered 2018-12-13: 120 mg via INTRAVENOUS
  Filled 2018-12-13: qty 12

## 2018-12-13 MED ORDER — PALONOSETRON HCL INJECTION 0.25 MG/5ML
INTRAVENOUS | Status: AC
  Filled 2018-12-13: qty 5

## 2018-12-13 MED ORDER — ACETAMINOPHEN 325 MG PO TABS
650.0000 mg | ORAL_TABLET | Freq: Once | ORAL | Status: AC
  Administered 2018-12-13: 650 mg via ORAL

## 2018-12-13 MED ORDER — TRASTUZUMAB CHEMO 150 MG IV SOLR
8.0000 mg/kg | Freq: Once | INTRAVENOUS | Status: AC
  Administered 2018-12-13: 504 mg via INTRAVENOUS
  Filled 2018-12-13: qty 24

## 2018-12-13 MED ORDER — OXYCODONE-ACETAMINOPHEN 5-325 MG PO TABS
ORAL_TABLET | ORAL | Status: AC
  Filled 2018-12-13: qty 1

## 2018-12-13 MED ORDER — SODIUM CHLORIDE 0.9% FLUSH
10.0000 mL | INTRAVENOUS | Status: DC | PRN
  Administered 2018-12-13: 10 mL
  Filled 2018-12-13: qty 10

## 2018-12-13 MED ORDER — DIPHENHYDRAMINE HCL 25 MG PO CAPS
ORAL_CAPSULE | ORAL | Status: AC
  Filled 2018-12-13: qty 2

## 2018-12-13 MED ORDER — SODIUM CHLORIDE 0.9 % IV SOLN
Freq: Once | INTRAVENOUS | Status: AC
  Administered 2018-12-13: 14:00:00 via INTRAVENOUS
  Filled 2018-12-13: qty 5

## 2018-12-13 MED ORDER — OXYCODONE-ACETAMINOPHEN 5-325 MG PO TABS
1.0000 | ORAL_TABLET | Freq: Once | ORAL | Status: AC
  Administered 2018-12-13: 1 via ORAL

## 2018-12-13 MED ORDER — SODIUM CHLORIDE 0.9 % IV SOLN
420.0000 mg | Freq: Once | INTRAVENOUS | Status: AC
  Administered 2018-12-13: 420 mg via INTRAVENOUS
  Filled 2018-12-13: qty 14

## 2018-12-13 MED ORDER — SODIUM CHLORIDE 0.9 % IV SOLN
Freq: Once | INTRAVENOUS | Status: AC
  Administered 2018-12-13: 09:00:00 via INTRAVENOUS
  Filled 2018-12-13: qty 250

## 2018-12-13 MED ORDER — ACETAMINOPHEN 325 MG PO TABS
ORAL_TABLET | ORAL | Status: AC
  Filled 2018-12-13: qty 2

## 2018-12-13 MED ORDER — SODIUM CHLORIDE 0.9 % IV SOLN
618.6000 mg | Freq: Once | INTRAVENOUS | Status: AC
  Administered 2018-12-13: 620 mg via INTRAVENOUS
  Filled 2018-12-13: qty 62

## 2018-12-13 MED ORDER — DIPHENHYDRAMINE HCL 25 MG PO CAPS
50.0000 mg | ORAL_CAPSULE | Freq: Once | ORAL | Status: AC
  Administered 2018-12-13: 50 mg via ORAL

## 2018-12-13 MED ORDER — PALONOSETRON HCL INJECTION 0.25 MG/5ML
0.2500 mg | Freq: Once | INTRAVENOUS | Status: AC
  Administered 2018-12-13: 0.25 mg via INTRAVENOUS

## 2018-12-13 MED ORDER — HEPARIN SOD (PORK) LOCK FLUSH 100 UNIT/ML IV SOLN
500.0000 [IU] | Freq: Once | INTRAVENOUS | Status: AC | PRN
  Administered 2018-12-13: 500 [IU]
  Filled 2018-12-13: qty 5

## 2018-12-13 NOTE — Patient Instructions (Signed)
[facility] Discharge Instructions for Patients Receiving Chemotherapy  Today you received the following chemotherapy agents Herceptin, Perjeta Taxotere and Carboplatin  To help prevent nausea and vomiting after your treatment, we encourage you to take your nausea medication as prescribed.   If you develop nausea and vomiting that is not controlled by your nausea medication, call the clinic.   BELOW ARE SYMPTOMS THAT SHOULD BE REPORTED IMMEDIATELY:  *FEVER GREATER THAN 100.5 F  *CHILLS WITH OR WITHOUT FEVER  NAUSEA AND VOMITING THAT IS NOT CONTROLLED WITH YOUR NAUSEA MEDICATION  *UNUSUAL SHORTNESS OF BREATH  *UNUSUAL BRUISING OR BLEEDING  TENDERNESS IN MOUTH AND THROAT WITH OR WITHOUT PRESENCE OF ULCERS  *URINARY PROBLEMS  *BOWEL PROBLEMS  UNUSUAL RASH Items with * indicate a potential emergency and should be followed up as soon as possible.  Feel free to call the clinic should you have any questions or concerns. The clinic phone number is [phone].  Please show the [facility] at check-in to the Emergency Department and triage nurse.  Trastuzumab injection for infusion(Herceptin) What is this medicine? TRASTUZUMAB (tras TOO zoo mab) is a monoclonal antibody. It is used to treat breast cancer and stomach cancer. This medicine may be used for other purposes; ask your health care provider or pharmacist if you have questions. COMMON BRAND NAME(S): Herceptin, Calla Kicks, OGIVRI What should I tell my health care provider before I take this medicine? They need to know if you have any of these conditions: -heart disease -heart failure -lung or breathing disease, like asthma -an unusual or allergic reaction to trastuzumab, benzyl alcohol, or other medications, foods, dyes, or preservatives -pregnant or trying to get pregnant -breast-feeding How should I use this medicine? This drug is given as an infusion into a vein. It is administered in a hospital or  clinic by a specially trained health care professional. Talk to your pediatrician regarding the use of this medicine in children. This medicine is not approved for use in children. Overdosage: If you think you have taken too much of this medicine contact a poison control center or emergency room at once. NOTE: This medicine is only for you. Do not share this medicine with others. What if I miss a dose? It is important not to miss a dose. Call your doctor or health care professional if you are unable to keep an appointment. What may interact with this medicine? This medicine may interact with the following medications: -certain types of chemotherapy, such as daunorubicin, doxorubicin, epirubicin, and idarubicin This list may not describe all possible interactions. Give your health care provider a list of all the medicines, herbs, non-prescription drugs, or dietary supplements you use. Also tell them if you smoke, drink alcohol, or use illegal drugs. Some items may interact with your medicine. What should I watch for while using this medicine? Visit your doctor for checks on your progress. Report any side effects. Continue your course of treatment even though you feel ill unless your doctor tells you to stop. Call your doctor or health care professional for advice if you get a fever, chills or sore throat, or other symptoms of a cold or flu. Do not treat yourself. Try to avoid being around people who are sick. You may experience fever, chills and shaking during your first infusion. These effects are usually mild and can be treated with other medicines. Report any side effects during the infusion to your health care professional. Fever and chills usually do not happen with later  infusions. Do not become pregnant while taking this medicine or for 7 months after stopping it. Women should inform their doctor if they wish to become pregnant or think they might be pregnant. Women of child-bearing potential  will need to have a negative pregnancy test before starting this medicine. There is a potential for serious side effects to an unborn child. Talk to your health care professional or pharmacist for more information. Do not breast-feed an infant while taking this medicine or for 7 months after stopping it. Women must use effective birth control with this medicine. What side effects may I notice from receiving this medicine? Side effects that you should report to your doctor or health care professional as soon as possible: -allergic reactions like skin rash, itching or hives, swelling of the face, lips, or tongue -chest pain or palpitations -cough -dizziness -feeling faint or lightheaded, falls -fever -general ill feeling or flu-like symptoms -signs of worsening heart failure like breathing problems; swelling in your legs and feet -unusually weak or tired Side effects that usually do not require medical attention (report to your doctor or health care professional if they continue or are bothersome): -bone pain -changes in taste -diarrhea -joint pain -nausea/vomiting -weight loss This list may not describe all possible side effects. Call your doctor for medical advice about side effects. You may report side effects to FDA at 1-800-FDA-1088. Where should I keep my medicine? This drug is given in a hospital or clinic and will not be stored at home. NOTE: This sheet is a summary. It may not cover all possible information. If you have questions about this medicine, talk to your doctor, pharmacist, or health care provider.  2019 Elsevier/Gold Standard (2016-10-05 14:37:52)   Pertuzumab injection(perjeta) What is this medicine? PERTUZUMAB (per TOOZ ue mab) is a monoclonal antibody. It is used to treat breast cancer. This medicine may be used for other purposes; ask your health care provider or pharmacist if you have questions. COMMON BRAND NAME(S): PERJETA What should I tell my health care  provider before I take this medicine? They need to know if you have any of these conditions: -heart disease -heart failure -high blood pressure -history of irregular heart beat -recent or ongoing radiation therapy -an unusual or allergic reaction to pertuzumab, other medicines, foods, dyes, or preservatives -pregnant or trying to get pregnant -breast-feeding How should I use this medicine? This medicine is for infusion into a vein. It is given by a health care professional in a hospital or clinic setting. Talk to your pediatrician regarding the use of this medicine in children. Special care may be needed. Overdosage: If you think you have taken too much of this medicine contact a poison control center or emergency room at once. NOTE: This medicine is only for you. Do not share this medicine with others. What if I miss a dose? It is important not to miss your dose. Call your doctor or health care professional if you are unable to keep an appointment. What may interact with this medicine? Interactions are not expected. Give your health care provider a list of all the medicines, herbs, non-prescription drugs, or dietary supplements you use. Also tell them if you smoke, drink alcohol, or use illegal drugs. Some items may interact with your medicine. This list may not describe all possible interactions. Give your health care provider a list of all the medicines, herbs, non-prescription drugs, or dietary supplements you use. Also tell them if you smoke, drink alcohol, or use illegal drugs.  Some items may interact with your medicine. What should I watch for while using this medicine? Your condition will be monitored carefully while you are receiving this medicine. Report any side effects. Continue your course of treatment even though you feel ill unless your doctor tells you to stop. Do not become pregnant while taking this medicine or for 7 months after stopping it. Women should inform their doctor  if they wish to become pregnant or think they might be pregnant. Women of child-bearing potential will need to have a negative pregnancy test before starting this medicine. There is a potential for serious side effects to an unborn child. Talk to your health care professional or pharmacist for more information. Do not breast-feed an infant while taking this medicine or for 7 months after stopping it. Women must use effective birth control with this medicine. Call your doctor or health care professional for advice if you get a fever, chills or sore throat, or other symptoms of a cold or flu. Do not treat yourself. Try to avoid being around people who are sick. You may experience fever, chills, and headache during the infusion. Report any side effects during the infusion to your health care professional. What side effects may I notice from receiving this medicine? Side effects that you should report to your doctor or health care professional as soon as possible: -breathing problems -chest pain or palpitations -dizziness -feeling faint or lightheaded -fever or chills -skin rash, itching or hives -sore throat -swelling of the face, lips, or tongue -swelling of the legs or ankles -unusually weak or tired Side effects that usually do not require medical attention (report to your doctor or health care professional if they continue or are bothersome): -diarrhea -hair loss -nausea, vomiting -tiredness This list may not describe all possible side effects. Call your doctor for medical advice about side effects. You may report side effects to FDA at 1-800-FDA-1088. Where should I keep my medicine? This drug is given in a hospital or clinic and will not be stored at home. NOTE: This sheet is a summary. It may not cover all possible information. If you have questions about this medicine, talk to your doctor, pharmacist, or health care provider.  2019 Elsevier/Gold Standard (2015-11-13  12:08:50)   Docetaxel injection(Taxotere) What is this medicine? DOCETAXEL (doe se TAX el) is a chemotherapy drug. It targets fast dividing cells, like cancer cells, and causes these cells to die. This medicine is used to treat many types of cancers like breast cancer, certain stomach cancers, head and neck cancer, lung cancer, and prostate cancer. This medicine may be used for other purposes; ask your health care provider or pharmacist if you have questions. COMMON BRAND NAME(S): Docefrez, Taxotere What should I tell my health care provider before I take this medicine? They need to know if you have any of these conditions: -infection (especially a virus infection such as chickenpox, cold sores, or herpes) -liver disease -low blood counts, like low white cell, platelet, or red cell counts -an unusual or allergic reaction to docetaxel, polysorbate 80, other chemotherapy agents, other medicines, foods, dyes, or preservatives -pregnant or trying to get pregnant -breast-feeding How should I use this medicine? This drug is given as an infusion into a vein. It is administered in a hospital or clinic by a specially trained health care professional. Talk to your pediatrician regarding the use of this medicine in children. Special care may be needed. Overdosage: If you think you have taken too much of this  medicine contact a poison control center or emergency room at once. NOTE: This medicine is only for you. Do not share this medicine with others. What if I miss a dose? It is important not to miss your dose. Call your doctor or health care professional if you are unable to keep an appointment. What may interact with this medicine? -cyclosporine -erythromycin -ketoconazole -medicines to increase blood counts like filgrastim, pegfilgrastim, sargramostim -vaccines Talk to your doctor or health care professional before taking any of these  medicines: -acetaminophen -aspirin -ibuprofen -ketoprofen -naproxen This list may not describe all possible interactions. Give your health care provider a list of all the medicines, herbs, non-prescription drugs, or dietary supplements you use. Also tell them if you smoke, drink alcohol, or use illegal drugs. Some items may interact with your medicine. What should I watch for while using this medicine? Your condition will be monitored carefully while you are receiving this medicine. You will need important blood work done while you are taking this medicine. This drug may make you feel generally unwell. This is not uncommon, as chemotherapy can affect healthy cells as well as cancer cells. Report any side effects. Continue your course of treatment even though you feel ill unless your doctor tells you to stop. In some cases, you may be given additional medicines to help with side effects. Follow all directions for their use. Call your doctor or health care professional for advice if you get a fever, chills or sore throat, or other symptoms of a cold or flu. Do not treat yourself. This drug decreases your body's ability to fight infections. Try to avoid being around people who are sick. This medicine may increase your risk to bruise or bleed. Call your doctor or health care professional if you notice any unusual bleeding. This medicine may contain alcohol in the product. You may get drowsy or dizzy. Do not drive, use machinery, or do anything that needs mental alertness until you know how this medicine affects you. Do not stand or sit up quickly, especially if you are an older patient. This reduces the risk of dizzy or fainting spells. Avoid alcoholic drinks. Do not become pregnant while taking this medicine or for 6 months after stopping it. Women should inform their doctor if they wish to become pregnant or think they might be pregnant. Men should not father a child while taking this medicine and for 3  months after stopping it. There is a potential for serious side effects to an unborn child. Talk to your health care professional or pharmacist for more information. Do not breast-feed an infant while taking this medicine or for 2 weeks after stopping it. This may interfere with the ability to father a child. You should talk to your doctor or health care professional if you are concerned about your fertility. What side effects may I notice from receiving this medicine? Side effects that you should report to your doctor or health care professional as soon as possible: -allergic reactions like skin rash, itching or hives, swelling of the face, lips, or tongue -low blood counts - This drug may decrease the number of white blood cells, red blood cells and platelets. You may be at increased risk for infections and bleeding. -signs of infection - fever or chills, cough, sore throat, pain or difficulty passing urine -signs of decreased platelets or bleeding - bruising, pinpoint red spots on the skin, black, tarry stools, nosebleeds -signs of decreased red blood cells - unusually weak or tired, fainting  spells, lightheadedness -breathing problems -fast or irregular heartbeat -low blood pressure -mouth sores -nausea and vomiting -pain, swelling, redness or irritation at the injection site -pain, tingling, numbness in the hands or feet -swelling of the ankle, feet, hands -weight gain Side effects that usually do not require medical attention (report to your doctor or health care professional if they continue or are bothersome): -bone pain -complete hair loss including hair on your head, underarms, pubic hair, eyebrows, and eyelashes -diarrhea -excessive tearing -changes in the color of fingernails -loosening of the fingernails -nausea -muscle pain -red flush to skin -sweating -weak or tired This list may not describe all possible side effects. Call your doctor for medical advice about side  effects. You may report side effects to FDA at 1-800-FDA-1088. Where should I keep my medicine? This drug is given in a hospital or clinic and will not be stored at home. NOTE: This sheet is a summary. It may not cover all possible information. If you have questions about this medicine, talk to your doctor, pharmacist, or health care provider.  2019 Elsevier/Gold Standard (2017-11-07 12:07:21)   Carboplatin injection What is this medicine? CARBOPLATIN (KAR boe pla tin) is a chemotherapy drug. It targets fast dividing cells, like cancer cells, and causes these cells to die. This medicine is used to treat ovarian cancer and many other cancers. This medicine may be used for other purposes; ask your health care provider or pharmacist if you have questions. COMMON BRAND NAME(S): Paraplatin What should I tell my health care provider before I take this medicine? They need to know if you have any of these conditions: -blood disorders -hearing problems -kidney disease -recent or ongoing radiation therapy -an unusual or allergic reaction to carboplatin, cisplatin, other chemotherapy, other medicines, foods, dyes, or preservatives -pregnant or trying to get pregnant -breast-feeding How should I use this medicine? This drug is usually given as an infusion into a vein. It is administered in a hospital or clinic by a specially trained health care professional. Talk to your pediatrician regarding the use of this medicine in children. Special care may be needed. Overdosage: If you think you have taken too much of this medicine contact a poison control center or emergency room at once. NOTE: This medicine is only for you. Do not share this medicine with others. What if I miss a dose? It is important not to miss a dose. Call your doctor or health care professional if you are unable to keep an appointment. What may interact with this medicine? -medicines for seizures -medicines to increase blood counts  like filgrastim, pegfilgrastim, sargramostim -some antibiotics like amikacin, gentamicin, neomycin, streptomycin, tobramycin -vaccines Talk to your doctor or health care professional before taking any of these medicines: -acetaminophen -aspirin -ibuprofen -ketoprofen -naproxen This list may not describe all possible interactions. Give your health care provider a list of all the medicines, herbs, non-prescription drugs, or dietary supplements you use. Also tell them if you smoke, drink alcohol, or use illegal drugs. Some items may interact with your medicine. What should I watch for while using this medicine? Your condition will be monitored carefully while you are receiving this medicine. You will need important blood work done while you are taking this medicine. This drug may make you feel generally unwell. This is not uncommon, as chemotherapy can affect healthy cells as well as cancer cells. Report any side effects. Continue your course of treatment even though you feel ill unless your doctor tells you to stop. In some  cases, you may be given additional medicines to help with side effects. Follow all directions for their use. Call your doctor or health care professional for advice if you get a fever, chills or sore throat, or other symptoms of a cold or flu. Do not treat yourself. This drug decreases your body's ability to fight infections. Try to avoid being around people who are sick. This medicine may increase your risk to bruise or bleed. Call your doctor or health care professional if you notice any unusual bleeding. Be careful brushing and flossing your teeth or using a toothpick because you may get an infection or bleed more easily. If you have any dental work done, tell your dentist you are receiving this medicine. Avoid taking products that contain aspirin, acetaminophen, ibuprofen, naproxen, or ketoprofen unless instructed by your doctor. These medicines may hide a fever. Do not become  pregnant while taking this medicine. Women should inform their doctor if they wish to become pregnant or think they might be pregnant. There is a potential for serious side effects to an unborn child. Talk to your health care professional or pharmacist for more information. Do not breast-feed an infant while taking this medicine. What side effects may I notice from receiving this medicine? Side effects that you should report to your doctor or health care professional as soon as possible: -allergic reactions like skin rash, itching or hives, swelling of the face, lips, or tongue -signs of infection - fever or chills, cough, sore throat, pain or difficulty passing urine -signs of decreased platelets or bleeding - bruising, pinpoint red spots on the skin, black, tarry stools, nosebleeds -signs of decreased red blood cells - unusually weak or tired, fainting spells, lightheadedness -breathing problems -changes in hearing -changes in vision -chest pain -high blood pressure -low blood counts - This drug may decrease the number of white blood cells, red blood cells and platelets. You may be at increased risk for infections and bleeding. -nausea and vomiting -pain, swelling, redness or irritation at the injection site -pain, tingling, numbness in the hands or feet -problems with balance, talking, walking -trouble passing urine or change in the amount of urine Side effects that usually do not require medical attention (report to your doctor or health care professional if they continue or are bothersome): -hair loss -loss of appetite -metallic taste in the mouth or changes in taste This list may not describe all possible side effects. Call your doctor for medical advice about side effects. You may report side effects to FDA at 1-800-FDA-1088. Where should I keep my medicine? This drug is given in a hospital or clinic and will not be stored at home. NOTE: This sheet is a summary. It may not cover all  possible information. If you have questions about this medicine, talk to your doctor, pharmacist, or health care provider.  2019 Elsevier/Gold Standard (2008-01-16 14:38:05)

## 2018-12-13 NOTE — Telephone Encounter (Signed)
No los °

## 2018-12-14 ENCOUNTER — Encounter: Payer: Self-pay | Admitting: Pharmacy Technician

## 2018-12-14 NOTE — Telephone Encounter (Signed)
Returned call to [facility] [phone]) and spoke to [place].  She was provided with the approval information for Tecfidera 240mg  (30-day supply not 90-day supply - approved through 10/25/2019).  She is aware the patient only has seven days of medication.  She will process this approval urgently so that the pharmacy can get the medication out for delivery in time.   I also called the patient back to let her know this was taken care of for her.  I suggested for her to call [facility] to set up delivery, if she has not heard from them within one day (she has the number).  This will help her to avoid any missed medication.  She verbalized understanding.

## 2018-12-14 NOTE — Telephone Encounter (Signed)
Pt is calling to get an update on this and she is down to her last 7 pills. Please advise.

## 2018-12-14 NOTE — Progress Notes (Signed)
I met with the patient in infusion to sign drug assist applications to [employer] for [place] and to [place] for Herceptin and [place]. These applications are based on excessive OOP.  Debbie Watts will apply for breast grant funds when available.

## 2018-12-14 NOTE — Telephone Encounter (Addendum)
I took call from phone staff and spoke with Debbie Watts at [facility]. They stated PA is needed for rx Tecfidera. They stated fax was sent and PA started on covermymeds. Unable to tell me who accessed and what fax it was sent to. Advised I will start a new urgent PA request for her.   I called [facility] and got her pharmacy info: ID: [id number], [place]: [id number], PCN: [id number], Group: [id number]

## 2018-12-14 NOTE — Telephone Encounter (Signed)
PA submitted urgently on covermymeds. Key: [id number]. Waiting on determination from [facility].

## 2018-12-15 ENCOUNTER — Telehealth: Payer: Self-pay

## 2018-12-15 ENCOUNTER — Inpatient Hospital Stay: Payer: Medicare HMO

## 2018-12-15 ENCOUNTER — Encounter: Payer: Self-pay | Admitting: Hematology and Oncology

## 2018-12-15 VITALS — BP 148/93 | HR 91 | Temp 98.3°F | Resp 18

## 2018-12-15 DIAGNOSIS — Z170 Estrogen receptor positive status [ER+]: Principal | ICD-10-CM

## 2018-12-15 DIAGNOSIS — Z5112 Encounter for antineoplastic immunotherapy: Secondary | ICD-10-CM | POA: Diagnosis not present

## 2018-12-15 DIAGNOSIS — C50411 Malignant neoplasm of upper-outer quadrant of right female breast: Secondary | ICD-10-CM | POA: Diagnosis not present

## 2018-12-15 DIAGNOSIS — Z720 Tobacco use: Secondary | ICD-10-CM | POA: Diagnosis not present

## 2018-12-15 DIAGNOSIS — Z5189 Encounter for other specified aftercare: Secondary | ICD-10-CM | POA: Diagnosis not present

## 2018-12-15 DIAGNOSIS — Z5111 Encounter for antineoplastic chemotherapy: Secondary | ICD-10-CM | POA: Diagnosis not present

## 2018-12-15 DIAGNOSIS — Z808 Family history of malignant neoplasm of other organs or systems: Secondary | ICD-10-CM | POA: Diagnosis not present

## 2018-12-15 MED ORDER — PEGFILGRASTIM-CBQV 6 MG/0.6ML ~~LOC~~ SOSY
PREFILLED_SYRINGE | SUBCUTANEOUS | Status: AC
  Filled 2018-12-15: qty 0.6

## 2018-12-15 MED ORDER — PEGFILGRASTIM-CBQV 6 MG/0.6ML ~~LOC~~ SOSY
6.0000 mg | PREFILLED_SYRINGE | Freq: Once | SUBCUTANEOUS | Status: AC
  Administered 2018-12-15: 6 mg via SUBCUTANEOUS

## 2018-12-15 NOTE — Progress Notes (Signed)
Pt is approved for the $1000 [place] grant.  

## 2018-12-15 NOTE — Progress Notes (Signed)
Patient Care Team: Neva Seat, MD as PCP - General (Internal Medicine) Excell Seltzer, MD as Consulting Physician (General Surgery) Nicholas Lose, MD as Consulting Physician (Hematology and Oncology) Kyung Rudd, MD as Consulting Physician (Radiation Oncology)  DIAGNOSIS:    ICD-10-CM   1. Malignant neoplasm of upper-outer quadrant of right breast in female, estrogen receptor positive ([place]) C50.411    Z17.0     SUMMARY OF ONCOLOGIC HISTORY:   Malignant neoplasm of upper-outer quadrant of right breast in female, estrogen receptor positive ([place])   11/21/2018 Initial Diagnosis    Screening detected right breast mass upper outer quadrant, in addition to areas of calcifications which were not biopsied.  By ultrasound she had 2 breast masses 12 o'clock position 2.9 cm: Grade 2 IDC with high-grade DCIS ER 100%, PR 70%, Ki-67 10%, HER-2 3+ by IHC and 11 o'clock position 1 cm, ER 90%, PR 50%, Ki-67 15%, HER-2 3+ by IHC, T2N0 stage Ib    11/29/2018 Cancer Staging    Staging form: Breast, AJCC 8th Edition - Clinical stage from 11/29/2018: Stage IB (cT2, cN0, cM0, G3, ER+, PR+, HER2+) - Signed by Nicholas Lose, MD on 11/29/2018    12/12/2018 -  Neo-Adjuvant Chemotherapy    Neoadjuvant chemotherapy with TCH Perjeta     CHIEF COMPLIANT: Cycle 1 Day 7 of TCHP   INTERVAL HISTORY: Debbie Watts is a 58 y.o. with above-mentioned history of right breast cancer currently on neoadjuvant chemotherapy with TCHP. She presented to the symptom management clinic yesterday for a migraine, vomiting, diarrhea, fatigue and nausea. She presents to the clinic alone today. She reports lower back pain that is a 7/10 despite taking Tramadol this morning, sore throat, loss of appetite, dairhea, nausea, and dry mouth, white tongue, and loss of taste. She has taken Imodium which has improved her diarrhea. Her husband passed away from a morphine and OxyContin overdose in 2011, and she is very averse to taking opioids  for her back pain. She takes Excedrin and Tylenol for her migraines with little relief. Her labs from yesterday show: WBC 8.9, Hg 13.3, platelets 254, ANC 7.0.   REVIEW OF SYSTEMS:   Constitutional: Denies fevers, chills or abnormal weight loss (+) migraine (+) loss of appetite (+) dry mouth (+) loss of taste Eyes: Denies blurriness of vision Ears, nose, mouth, throat, and face: Denies mucositis (+) sore throat (+) whiteness on tongue Respiratory: Denies cough, dyspnea or wheezes Cardiovascular: Denies palpitation, chest discomfort Gastrointestinal: Denies heartburn (+) nausea (+) diarrhea (+) vomiting Skin: Denies abnormal skin rashes MSK: (+) lower back pain Lymphatics: Denies new lymphadenopathy or easy bruising Neurological: Denies numbness, tingling or new weaknesses Behavioral/Psych: Mood is stable, no new changes  Extremities: No lower extremity edema Breast: denies any pain or lumps or nodules in either breasts All other systems were reviewed with the patient and are negative.  I have reviewed the past medical history, past surgical history, social history and family history with the patient and they are unchanged from previous note.  ALLERGIES:  is allergic to procaine.  MEDICATIONS:  Current Outpatient Medications  Medication Sig Dispense Refill  . Acetaminophen-Caffeine (TENSION HEADACHE RELIEF PO) Take by mouth.    . betamethasone valerate lotion (VALISONE) 0.1 % APPLY 1 APPLICATION TOPICALLY 2 TIMES DAILY.    . carbidopa-levodopa (SINEMET IR) 10-100 MG tablet TAKE 1 TABLET BY MOUTH AT BEDTIME. 30 tablet 5  . cyclobenzaprine (FLEXERIL) 10 MG tablet TAKE 1 TABLET BY MOUTH EVERYDAY AT BEDTIME 30 tablet 1  .  dexamethasone (DECADRON) 4 MG tablet Take 1 tablet (4 mg total) by mouth daily. Take 1 tablet day before chemo and 1 tablet day after chemo with food 12 tablet 0  . diclofenac (VOLTAREN) 75 MG EC tablet TAKE 1 TABLET BY MOUTH TWICE A DAY 60 tablet 5  . Dimethyl Fumarate  240 MG CPDR Take 1 capsule (240 mg total) by mouth 2 (two) times daily. 180 capsule 3  . eletriptan (RELPAX) 40 MG tablet Take 40 mg by mouth as needed for migraine or headache. One tablet by mouth at onset of headache. May repeat in 2 hours if headache persists or recurs.    . ferrous sulfate 325 (65 FE) MG tablet Take 325 mg by mouth daily.    . fluconazole (DIFLUCAN) 100 MG tablet Take 1 tablet (100 mg total) by mouth daily. 30 tablet 0  . gabapentin (NEURONTIN) 300 MG capsule TAKE 2 CAPSULES (600 MG TOTAL) BY MOUTH 2 (TWO) TIMES DAILY. 360 capsule 1  . lidocaine-prilocaine (EMLA) cream Apply to affected area once 30 g 3  . lisinopril (PRINIVIL,ZESTRIL) 5 MG tablet TAKE 1 TABLET BY MOUTH EVERY DAY 90 tablet 3  . LORazepam (ATIVAN) 0.5 MG tablet Take 1 tablet (0.5 mg total) by mouth at bedtime as needed (Nausea or vomiting). 30 tablet 0  . ondansetron (ZOFRAN) 8 MG tablet Take 1 tablet (8 mg total) by mouth 2 (two) times daily as needed (Nausea or vomiting). Begin 4 days after chemotherapy. 30 tablet 1  . PARoxetine (PAXIL) 20 MG tablet Take 1 tablet (20 mg total) by mouth daily. 90 tablet 3  . prochlorperazine (COMPAZINE) 10 MG tablet Take 1 tablet (10 mg total) by mouth every 6 (six) hours as needed (Nausea or vomiting). 30 tablet 1  . Tiotropium Bromide Monohydrate 1.25 MCG/ACT AERS Inhale into the lungs.    . traMADol (ULTRAM) 50 MG tablet TAKE 1 TABLET BY MOUTH TWICE A DAY AS NEEDED 60 tablet 0  . VENTOLIN HFA 108 (90 Base) MCG/ACT inhaler Inhale 1-2 puffs into the lungs every 6 (six) hours as needed for wheezing or shortness of breath. 1 Inhaler 3   No current facility-administered medications for this visit.     PHYSICAL EXAMINATION: ECOG PERFORMANCE STATUS: 1 - Symptomatic but completely ambulatory  Vitals:   12/19/18 1117  BP: (!) 143/92  Pulse: 99  Resp: 17  Temp: 98.2 F (36.8 C)  SpO2: 99%   Filed Weights   12/19/18 1117  Weight: 138 lb 3.2 oz (62.7 kg)    GENERAL:  alert, no distress and comfortable SKIN: skin color, texture, turgor are normal, no rashes or significant lesions EYES: normal, Conjunctiva are pink and non-injected, sclera clear OROPHARYNX: no exudate, no erythema and lips, buccal mucosa, and tongue normal  NECK: supple, thyroid normal size, non-tender, without nodularity LYMPH: no palpable lymphadenopathy in the cervical, axillary or inguinal LUNGS: clear to auscultation and percussion with normal breathing effort HEART: regular rate & rhythm and no murmurs and no lower extremity edema ABDOMEN: abdomen soft, non-tender and normal bowel sounds MUSCULOSKELETAL: no cyanosis of digits and no clubbing  NEURO: alert & oriented x 3 with fluent speech, no focal motor/sensory deficits EXTREMITIES: No lower extremity edema  LABORATORY DATA:  I have reviewed the data as listed CMP Latest Ref Rng & Units 12/18/2018 12/12/2018 11/29/2018  Glucose 70 - 99 mg/dL 102(H) 93 97  BUN 6 - 20 mg/dL _0 Creatinine 0.44 - 1.00 mg/dL 0.65 0.78 0.74  Sodium  135 - 145 mmol/L 139 142 141  Potassium 3.5 - 5.1 mmol/L 3.6 4.3 4.2  Chloride 98 - 111 mmol/L 102 105 103  CO2 22 - 32 mmol/L _0 Calcium 8.9 - 10.3 mg/dL 8.5(L) 8.9 9.5  Total Protein 6.5 - 8.1 g/dL 6.1(L) 6.9 7.0  Total Bilirubin 0.3 - 1.2 mg/dL 0.6 0.5 0.2(L)  Alkaline Phos 38 - 126 U/L 95 81 84  AST 15 - 41 U/L _1 ALT 0 - 44 U/L 49(H) 35 51(H)    Lab Results  Component Value Date   WBC 8.9 12/18/2018   HGB 13.3 12/18/2018   HCT 40.3 12/18/2018   MCV 90.4 12/18/2018   PLT 254 12/18/2018   NEUTROABS 7.0 12/18/2018    ASSESSMENT & PLAN:  Malignant neoplasm of upper-outer quadrant of right breast in female, estrogen receptor positive ([place]) 11/21/2018:Screening detected right breast mass upper outer quadrant, in addition to areas of calcifications which were not biopsied. By ultrasound she had 2 breast masses 12 o'clock position 2.9 cm: Grade 2 IDC with high-grade DCIS ER 100%,  PR 70%, Ki-67 10%, HER-2 3+ by IHC and 11 o'clock position 1 cm, ER 90%, PR 50%, Ki-67 15%, HER-2 3+ by IHC, T2N0 stage Ib  Recommendation: 1. Neoadjuvant chemotherapy with TCH Perjeta 6 cycles followed by Herceptinand Perjeta versus Kadcylamaintenance for 1 year 2. Followed bymastectomy versusbreast conserving surgery with sentinel lymph node study 3. Followed by adjuvant radiation therapy if patient had lumpectomy 4.Followed by adjuvant antiestrogen therapy  Breast MRI 12/03/2018: 2.3 cm enhancing mass 4 o'clock position right breast, 11 o'clock position biopsy-proven malignancy area did not show any discrete mass -------------------------------------------------------------------------------------------------------------------------------------------------------- Current treatment: Cycle 1 day 8 TCH Perjeta started 12/13/2018 Labs reviewed Echocardiogram: 12/06/2018: EF greater than 65%  Chemo toxicities: 1.  Bone pain especially low back pain as result of Neulasta: I discussed with her about Claritin with each treatment If her pain does not get better with tramadol we can send her a small supply of Percocet as needed. 2.  Diarrhea: Well controlled with Imodium 3.  Mild nausea 4.  Migraine headache: Improved with Excedrin 5.  Fatigue due to chemotherapy  Return to clinic in 2 weeks for cycle 2    No orders of the defined types were placed in this encounter.  The patient has a good understanding of the overall plan. she agrees with it. she will call with any problems that may develop before the next visit here.  Nicholas Lose, MD 12/19/2018  Julious Oka Dorshimer am acting as scribe for Dr. Nicholas Lose.  I have reviewed the above documentation for accuracy and completeness, and I agree with the above.

## 2018-12-15 NOTE — Telephone Encounter (Signed)
Nurse placed called for first time chemo follow up.  Patient currently sleeping, fiance stated she's been feeling well so far.  He will have her return call upon awakening.

## 2018-12-18 ENCOUNTER — Inpatient Hospital Stay (HOSPITAL_BASED_OUTPATIENT_CLINIC_OR_DEPARTMENT_OTHER): Payer: Medicare HMO | Admitting: Medical

## 2018-12-18 ENCOUNTER — Telehealth: Payer: Self-pay

## 2018-12-18 ENCOUNTER — Inpatient Hospital Stay: Payer: Medicare HMO

## 2018-12-18 ENCOUNTER — Telehealth: Payer: Self-pay | Admitting: *Deleted

## 2018-12-18 VITALS — BP 147/88 | HR 98 | Temp 98.4°F | Resp 20 | Ht 62.0 in | Wt 139.2 lb

## 2018-12-18 DIAGNOSIS — Z5112 Encounter for antineoplastic immunotherapy: Secondary | ICD-10-CM | POA: Diagnosis not present

## 2018-12-18 DIAGNOSIS — R5383 Other fatigue: Secondary | ICD-10-CM

## 2018-12-18 DIAGNOSIS — C50411 Malignant neoplasm of upper-outer quadrant of right female breast: Secondary | ICD-10-CM

## 2018-12-18 DIAGNOSIS — R112 Nausea with vomiting, unspecified: Secondary | ICD-10-CM

## 2018-12-18 DIAGNOSIS — G43909 Migraine, unspecified, not intractable, without status migrainosus: Secondary | ICD-10-CM | POA: Diagnosis not present

## 2018-12-18 DIAGNOSIS — Z170 Estrogen receptor positive status [ER+]: Secondary | ICD-10-CM

## 2018-12-18 DIAGNOSIS — Z5189 Encounter for other specified aftercare: Secondary | ICD-10-CM | POA: Diagnosis not present

## 2018-12-18 DIAGNOSIS — Z720 Tobacco use: Secondary | ICD-10-CM | POA: Diagnosis not present

## 2018-12-18 DIAGNOSIS — Z5111 Encounter for antineoplastic chemotherapy: Secondary | ICD-10-CM | POA: Diagnosis not present

## 2018-12-18 DIAGNOSIS — R197 Diarrhea, unspecified: Secondary | ICD-10-CM | POA: Diagnosis not present

## 2018-12-18 DIAGNOSIS — Z808 Family history of malignant neoplasm of other organs or systems: Secondary | ICD-10-CM | POA: Diagnosis not present

## 2018-12-18 LAB — CBC WITH DIFFERENTIAL (CANCER CENTER ONLY)
Abs Immature Granulocytes: 0.6 10*3/uL — ABNORMAL HIGH (ref 0.00–0.07)
Basophils Absolute: 0.2 10*3/uL — ABNORMAL HIGH (ref 0.0–0.1)
Basophils Relative: 2 %
Eosinophils Absolute: 0.1 10*3/uL (ref 0.0–0.5)
Eosinophils Relative: 1 %
HEMATOCRIT: 40.3 % (ref 36.0–46.0)
Hemoglobin: 13.3 g/dL (ref 12.0–15.0)
Immature Granulocytes: 7 %
LYMPHS ABS: 0.8 10*3/uL (ref 0.7–4.0)
Lymphocytes Relative: 9 %
MCH: 29.8 pg (ref 26.0–34.0)
MCHC: 33 g/dL (ref 30.0–36.0)
MCV: 90.4 fL (ref 80.0–100.0)
MONOS PCT: 3 %
Monocytes Absolute: 0.3 10*3/uL (ref 0.1–1.0)
NRBC: 0 % (ref 0.0–0.2)
Neutro Abs: 7 10*3/uL (ref 1.7–7.7)
Neutrophils Relative %: 78 %
Platelet Count: 254 10*3/uL (ref 150–400)
RBC: 4.46 MIL/uL (ref 3.87–5.11)
RDW: 13 % (ref 11.5–15.5)
WBC Count: 8.9 10*3/uL (ref 4.0–10.5)

## 2018-12-18 LAB — CMP (CANCER CENTER ONLY)
ALK PHOS: 95 U/L (ref 38–126)
ALT: 49 U/L — ABNORMAL HIGH (ref 0–44)
ANION GAP: 10 (ref 5–15)
AST: 23 U/L (ref 15–41)
Albumin: 3.6 g/dL (ref 3.5–5.0)
BUN: 10 mg/dL (ref 6–20)
CO2: 27 mmol/L (ref 22–32)
Calcium: 8.5 mg/dL — ABNORMAL LOW (ref 8.9–10.3)
Chloride: 102 mmol/L (ref 98–111)
Creatinine: 0.65 mg/dL (ref 0.44–1.00)
GFR, Est AFR Am: >60 mL/min (ref >60)
GFR, Estimated: >60 mL/min (ref >60)
Glucose, Bld: 102 mg/dL — ABNORMAL HIGH (ref 70–99)
Potassium: 3.6 mmol/L (ref 3.5–5.1)
Sodium: 139 mmol/L (ref 135–145)
Total Bilirubin: 0.6 mg/dL (ref 0.3–1.2)
Total Protein: 6.1 g/dL — ABNORMAL LOW (ref 6.5–8.1)

## 2018-12-18 LAB — SAMPLE TO BLOOD BANK

## 2018-12-18 LAB — MAGNESIUM: Magnesium: 1.7 mg/dL (ref 1.7–2.4)

## 2018-12-18 MED ORDER — SODIUM CHLORIDE 0.9 % IV SOLN
Freq: Once | INTRAVENOUS | Status: AC
  Administered 2018-12-18: 12:00:00 via INTRAVENOUS
  Filled 2018-12-18: qty 250

## 2018-12-18 MED ORDER — KETOROLAC TROMETHAMINE 60 MG/2ML IM SOLN
60.0000 mg | Freq: Once | INTRAMUSCULAR | Status: AC
  Administered 2018-12-18: 60 mg via INTRAMUSCULAR
  Filled 2018-12-18: qty 2

## 2018-12-18 MED ORDER — HEPARIN SOD (PORK) LOCK FLUSH 100 UNIT/ML IV SOLN
500.0000 [IU] | Freq: Once | INTRAVENOUS | Status: AC
  Administered 2018-12-18: 500 [IU] via INTRAVENOUS
  Filled 2018-12-18: qty 5

## 2018-12-18 MED ORDER — SODIUM CHLORIDE 0.9% FLUSH
10.0000 mL | Freq: Once | INTRAVENOUS | Status: AC
  Administered 2018-12-18: 10 mL via INTRAVENOUS
  Filled 2018-12-18: qty 10

## 2018-12-18 MED ORDER — KETOROLAC TROMETHAMINE 30 MG/ML IJ SOLN
INTRAMUSCULAR | Status: AC
  Filled 2018-12-18: qty 2

## 2018-12-18 NOTE — Telephone Encounter (Signed)
Nurse spoke with patient and patient's step daughter.  Patient with severe headache and diarrhea since Saturday.  Per pt able to tolerate fluids well over weekend. Denies any fevers.   Pt is status post D1C1 TCHP.  Nurse reviewed with Dr. Lindi Adie - in agreement to have patient be assessed in symptom management clinic.  Pt in agreement.

## 2018-12-18 NOTE — Telephone Encounter (Signed)
-----   Message from Cherylynn Ridges, RN sent at 12/18/2018  9:43 AM EST ----- Regarding: Dr. Lindi Adie 1st Chemo F/U 1st [facility] 12-15-2018.

## 2018-12-18 NOTE — Patient Instructions (Signed)

## 2018-12-18 NOTE — Telephone Encounter (Signed)
The patient can take Imodium.  She can take 1-2 4 times daily.  We can see her for labs and fluids if needed. Thanks, Lucianne Lei

## 2018-12-18 NOTE — Telephone Encounter (Signed)
"  Nani Gasser Krupka's stepdaughter Moises Blood [phone]).  Clarise Cruz and [place] audible with call) Sick all weekend after received first chemotherapy last week.  We need to know how to assist her.  Told to get her to ED when we called over weekend but Dr. Lindi Adie may want to do something different.     Severe migraine headache three days.  Told not to take [facility] headache and tylenol is not working.    Diarrhea started Saturday.  Estimate three to four times Saturday and back and forth to bathroom Sunday.  Squirts of liquid/soft bowel.  Can she take Imodium?  Vomited a couple times Sunday and twice last night.  Zofran used yesterday and just took another this morning.   Not eating or drinking; she doesn't feel well.  At least 16 oz water or more daily.     We can bring her there immediately if she needs to come in."

## 2018-12-19 ENCOUNTER — Inpatient Hospital Stay: Payer: Medicare HMO

## 2018-12-19 ENCOUNTER — Inpatient Hospital Stay: Payer: Medicare HMO | Admitting: Hematology and Oncology

## 2018-12-19 DIAGNOSIS — Z170 Estrogen receptor positive status [ER+]: Secondary | ICD-10-CM | POA: Diagnosis not present

## 2018-12-19 DIAGNOSIS — C50411 Malignant neoplasm of upper-outer quadrant of right female breast: Secondary | ICD-10-CM | POA: Diagnosis not present

## 2018-12-19 DIAGNOSIS — R530 Neoplastic (malignant) related fatigue: Secondary | ICD-10-CM

## 2018-12-19 DIAGNOSIS — M545 Low back pain: Secondary | ICD-10-CM

## 2018-12-19 DIAGNOSIS — R110 Nausea: Secondary | ICD-10-CM | POA: Diagnosis not present

## 2018-12-19 DIAGNOSIS — Z720 Tobacco use: Secondary | ICD-10-CM | POA: Diagnosis not present

## 2018-12-19 DIAGNOSIS — G43909 Migraine, unspecified, not intractable, without status migrainosus: Secondary | ICD-10-CM

## 2018-12-19 DIAGNOSIS — Z5112 Encounter for antineoplastic immunotherapy: Secondary | ICD-10-CM | POA: Diagnosis not present

## 2018-12-19 DIAGNOSIS — Z808 Family history of malignant neoplasm of other organs or systems: Secondary | ICD-10-CM | POA: Diagnosis not present

## 2018-12-19 DIAGNOSIS — R197 Diarrhea, unspecified: Secondary | ICD-10-CM | POA: Diagnosis not present

## 2018-12-19 DIAGNOSIS — Z5111 Encounter for antineoplastic chemotherapy: Secondary | ICD-10-CM | POA: Diagnosis not present

## 2018-12-19 DIAGNOSIS — Z5189 Encounter for other specified aftercare: Secondary | ICD-10-CM | POA: Diagnosis not present

## 2018-12-19 MED ORDER — FLUCONAZOLE 100 MG PO TABS: 100.0000 mg | ORAL_TABLET | Freq: Every day | ORAL | 0 refills | Status: DC

## 2018-12-19 NOTE — Assessment & Plan Note (Addendum)
11/21/2018:Screening detected right breast mass upper outer quadrant, in addition to areas of calcifications which were not biopsied. By ultrasound she had 2 breast masses 12 o'clock position 2.9 cm: Grade 2 IDC with high-grade DCIS ER 100%, PR 70%, Ki-67 10%, HER-2 3+ by IHC and 11 o'clock position 1 cm, ER 90%, PR 50%, Ki-67 15%, HER-2 3+ by IHC, T2N0 stage Ib  Recommendation: 1. Neoadjuvant chemotherapy with TCH Perjeta 6 cycles followed by Herceptinand Perjeta versus Kadcylamaintenance for 1 year 2. Followed bymastectomy versusbreast conserving surgery with sentinel lymph node study 3. Followed by adjuvant radiation therapy if patient had lumpectomy 4.Followed by adjuvant antiestrogen therapy  Breast MRI 12/03/2018: 2.3 cm enhancing mass 4 o'clock position right breast, 11 o'clock position biopsy-proven malignancy area did not show any discrete mass -------------------------------------------------------------------------------------------------------------------------------------------------------- Current treatment: Cycle 1 day 8 TCH Perjeta started 12/13/2018 Labs reviewed Echocardiogram: 12/06/2018: EF greater than 65%  Chemo toxicities: 1.  Bone pain especially low back pain as result of Neulasta: I discussed with her about Claritin with each treatment If her pain does not get better with tramadol we can send her a small supply of Percocet as needed. 2.  Diarrhea: Well controlled with Imodium 3.  Mild nausea 4.  Migraine headache: Improved with Excedrin 5.  Fatigue due to chemotherapy  Return to clinic in 2 weeks for cycle 2 

## 2018-12-20 NOTE — Telephone Encounter (Signed)
Left VM. Gave pt this RN's number to call if she had any questions concerning UPBEAT clinical trial.

## 2018-12-21 ENCOUNTER — Other Ambulatory Visit: Payer: Self-pay | Admitting: Hematology and Oncology

## 2018-12-21 ENCOUNTER — Telehealth: Payer: Self-pay

## 2018-12-21 ENCOUNTER — Telehealth: Payer: Self-pay | Admitting: *Deleted

## 2018-12-21 MED ORDER — OXYCODONE-ACETAMINOPHEN 5-325 MG PO TABS: 1.0000 | ORAL_TABLET | Freq: Three times a day (TID) | ORAL | 0 refills | Status: DC | PRN

## 2018-12-21 NOTE — Telephone Encounter (Signed)
Received fax from [place] to complete PA on [employer]. I compelted and waiting on MD signature. I will then fax.

## 2018-12-21 NOTE — Progress Notes (Signed)
[facility] Progress Note   Debbie Watts [id number] 13-Sep-1961 58 y.o.  Peter Garter is managed by Dr. Nicholas Lose  Actively treated with chemotherapy/immunotherapy/hormonal therapy: yes  Current therapy: [place] P  Last treated: 12/13/2018 (cycle 1)  Next scheduled appointment with provider: 01/02/2019  Assessment: Plan:    Fatigue, unspecified type - Plan: CBC with Differential ([place] Only), CMP ([place] only), Sample to Blood Bank, Magnesium, 0.9 %  sodium chloride infusion, sodium chloride flush (NS) 0.9 % injection 10 mL, heparin lock flush 100 unit/mL  Non-intractable vomiting with nausea, unspecified vomiting type - Plan: CBC with Differential ([place] Only), CMP ([place] only), Magnesium, 0.9 %  sodium chloride infusion, sodium chloride flush (NS) 0.9 % injection 10 mL, heparin lock flush 100 unit/mL  Diarrhea, unspecified type - Plan: CBC with Differential ([place] Only), CMP ([place] only), Magnesium, 0.9 %  sodium chloride infusion, sodium chloride flush (NS) 0.9 % injection 10 mL, heparin lock flush 100 unit/mL  Migraine without status migrainosus, not intractable, unspecified migraine type - Plan: ketorolac (TORADOL) injection 60 mg, 0.9 %  sodium chloride infusion, sodium chloride flush (NS) 0.9 % injection 10 mL, heparin lock flush 100 unit/mL  Malignant neoplasm of upper-outer quadrant of right breast in female, estrogen receptor positive (HCC)   Fatigue, diarrhea, nausea and vomiting: A CBC, chemistry panel, and a magnesium level were collected today.  The patient was given 1 L of normal saline.  Migraine: The patient was given Toradol 60 mg IM x1.  ER positive malignant neoplasm of the right breast: The patient is status post cycle 1 of [place] P which was dosed on 12/13/2018.  She is scheduled to see Dr. Lindi Adie on 01/02/2019.  Please see After Visit Summary for patient specific instructions.  Future  Appointments  Date Time Provider [place]  01/02/2019 11:00 AM [facility] LAB 2 [facility] None  01/02/2019 11:15 AM [facility] [place] FLUSH [facility] None  01/02/2019 11:45 AM Nicholas Lose, MD [facility] None  01/03/2019  8:30 AM [facility] INFUSION [facility] None  01/22/2019 10:30 AM [facility] LAB 6 [facility] None  01/22/2019 10:45 AM [facility] [place] FLUSH [facility] None  01/22/2019 11:15 AM Nicholas Lose, MD [facility] None  01/23/2019  8:30 AM [facility] INFUSION [facility] None  03/07/2019 10:00 AM [facility] ECHO 1-BUZZ [facility]  03/07/2019 11:00 AM Bensimhon, Shaune Pascal, MD [facility] None  03/20/2019  3:30 PM Sater, Nanine Means, MD [facility] None    Orders Placed This Encounter  Procedures  . CBC with Differential ([place] Only)  . CMP ([place] only)  . Magnesium  . Sample to Blood Bank       Subjective:   Patient ID:  Debbie Watts is a 59 y.o. (DOB 1960/11/15) female.  Chief Complaint:  Chief Complaint  Patient presents with  . Migraine    HPI Debbie Watts is a 58 year old female with a diagnosis of an ER positive malignant neoplasm of the right breast who is managed by Dr. Nicholas Lose and is status post cycle 1 of [place] P which was dosed on 12/13/2018.  The patient presents to clinic today with a report of a migraine.  She has had this for several days.  She is having no visual changes.  She is also been having anorexia, diarrhea, nausea and vomiting.  She denies fevers, chills, or sweats.  Medications: I have reviewed the patient's current medications.  Allergies:  Allergies  Allergen Reactions  . Procaine Shortness Of  Breath    Past Medical History:  Diagnosis Date  . Depression   . Deviated nasal septum   . Hearing loss 06/11/2014   Bilateral  . Herniated cervical disc   . Hypertension   . Migraine   . Miscarriage    2  . MS (multiple sclerosis) ([place])     Past Surgical History:  Procedure Laterality Date  . NASAL SEPTUM SURGERY     . spinal injections      Family History  Adopted: Yes  Problem Relation Age of Onset  . Cancer Maternal Uncle        throat    Social History   Socioeconomic History  . Marital status: Single    Spouse name: Not on file  . Number of children: 2  . Years of education: 104  . Highest education level: Not on file  Occupational History  . Occupation: Disabled  Social Needs  . Financial resource strain: Not on file  . Food insecurity:    Worry: Not on file    Inability: Not on file  . Transportation needs:    Medical: Not on file    Non-medical: Not on file  Tobacco Use  . Smoking status: Current Every Day Smoker    Packs/day: 0.50    Years: 44.00    Pack years: 22.00    Types: Cigarettes  . Smokeless tobacco: Never Used  . Tobacco comment: 1/2  to 3/4 pack per day   Substance and Sexual Activity  . Alcohol use: Yes    Comment: 0-5 beers daily  . Drug use: Yes    Types: Marijuana  . Sexual activity: Not Currently    Partners: Male  Lifestyle  . Physical activity:    Days per week: Not on file    Minutes per session: Not on file  . Stress: Not on file  Relationships  . Social connections:    Talks on phone: Not on file    Gets together: Not on file    Attends religious service: Not on file    Active member of club or organization: Not on file    Attends meetings of clubs or organizations: Not on file    Relationship status: Not on file  . Intimate partner violence:    Fear of current or ex partner: Not on file    Emotionally abused: Not on file    Physically abused: Not on file    Forced sexual activity: Not on file  Other Topics Concern  . Not on file  Social History Narrative   Patient is single with 2 children.   Patient is right handed.   Current Social History 08/10/2018        Right handed       Patient lives with significant other Debbie Watts) in one level Townhome 08/10/2018    Transportation: Patient has own vehicle  08/10/2018   Important  Relationships Debbie Watts 08/10/2018    Pets: one dog named Browser 08/10/2018   Education / Work:  12th grade/ Disabled 08/10/2018   Interests / Fun: Watching baseball and Nascar 08/10/2018   Current Stressors: Significant other is depressed over his poor health 08/10/2018   Religious / Personal Beliefs: "I believe in God" 08/10/2018   Other: "I had a miscarriage before I had my 2 sons." 08/10/2018   L. Silvano Rusk, RN, BSN  Past Medical History, Surgical history, Social history, and Family history were reviewed and updated as appropriate.   Please see review of systems for further details on the patient's review from today.   Review of Systems:  Review of Systems  Constitutional: Positive for appetite change. Negative for chills, diaphoresis and fever.  HENT: Negative for trouble swallowing.   Respiratory: Negative for cough, choking, shortness of breath and wheezing.   Cardiovascular: Negative for chest pain and palpitations.  Gastrointestinal: Positive for diarrhea, nausea and vomiting. Negative for constipation.  Genitourinary: Negative for decreased urine volume.  Musculoskeletal: Negative for arthralgias and myalgias.  Neurological: Positive for headaches.    Objective:   Physical Exam:  BP (!) 147/88 (BP Location: Left Arm, Patient Position: Supine)   Pulse 98   Temp 98.4 F (36.9 C) (Oral)   Resp 20   Ht 5' 2" (1.575 m)   Wt 139 lb 3.2 oz (63.1 kg)   SpO2 97%   BMI 25.46 kg/m  ECOG: 0  Physical Exam Constitutional:      General: She is not in acute distress.    Appearance: She is not diaphoretic.  HENT:     Head: Normocephalic and atraumatic.     Mouth/Throat:     Mouth: Mucous membranes are moist.     Pharynx: No oropharyngeal exudate or posterior oropharyngeal erythema.  Cardiovascular:     Rate and Rhythm: Normal rate and regular rhythm.     Heart sounds: Normal  heart sounds. No murmur. No friction rub. No gallop.   Pulmonary:     Effort: Pulmonary effort is normal. No respiratory distress.     Breath sounds: Normal breath sounds. No stridor. No wheezing or rales.  Abdominal:     General: Bowel sounds are normal. There is no distension.     Palpations: Abdomen is soft. There is no mass.     Tenderness: There is no abdominal tenderness. There is no guarding or rebound.  Skin:    General: Skin is warm and dry.  Neurological:     Mental Status: She is alert.     Coordination: Coordination normal.     Gait: Gait normal.     Lab Review:     Component Value Date/Time   NA 139 12/18/2018 1135   NA 140 08/31/2017 1524   K 3.6 12/18/2018 1135   CL 102 12/18/2018 1135   CO2 27 12/18/2018 1135   GLUCOSE 102 (H) 12/18/2018 1135   BUN 10 12/18/2018 1135   BUN 17 08/31/2017 1524   CREATININE 0.65 12/18/2018 1135   CALCIUM 8.5 (L) 12/18/2018 1135   PROT 6.1 (L) 12/18/2018 1135   PROT 6.4 08/31/2017 1524   ALBUMIN 3.6 12/18/2018 1135   ALBUMIN 4.3 08/31/2017 1524   AST 23 12/18/2018 1135   ALT 49 (H) 12/18/2018 1135   ALKPHOS 95 12/18/2018 1135   BILITOT 0.6 12/18/2018 1135   GFRNONAA >60 12/18/2018 1135   GFRAA >60 12/18/2018 1135       Component Value Date/Time   WBC 8.9 12/18/2018 1135   RBC 4.46 12/18/2018 1135   HGB 13.3 12/18/2018 1135   HGB 14.3 09/19/2018 1138   HCT 40.3 12/18/2018 1135   HCT 42.0 09/19/2018 1138   PLT 254 12/18/2018 1135   PLT 316 09/19/2018 1138   MCV 90.4 12/18/2018 1135   MCV 90 09/19/2018 1138   MCH 29.8 12/18/2018 1135   MCHC 33.0 12/18/2018 1135   RDW 13.0 12/18/2018 1135   RDW  12.9 09/19/2018 1138   LYMPHSABS 0.8 12/18/2018 1135   LYMPHSABS 0.8 09/19/2018 1138   MONOABS 0.3 12/18/2018 1135   EOSABS 0.1 12/18/2018 1135   EOSABS 0.1 09/19/2018 1138   BASOSABS 0.2 (H) 12/18/2018 1135   BASOSABS 0.1 09/19/2018 1138   -------------------------------  Imaging from last 24 hours (if  applicable):  Radiology interpretation: Mr Breast Bilateral [place] Cad  Addendum Date: 12/13/2018   ADDENDUM REPORT: 12/13/2018 13:14 ADDENDUM: Diagnostic report dated 11/20/2018 stated that if no additional areas seen on the patient's MRI than 2 stereotactic biopsies of calcifications in the right breast is recommended. Electronically Signed   By: Lillia Mountain M.D.   On: 12/13/2018 13:14   Result Date: 12/13/2018 CLINICAL DATA:  Biopsy proven invasive ductal carcinoma and ductal carcinoma in-situ in the 12 o'clock region of the right breast. Biopsy proven invasive ductal carcinoma in the 11 o'clock region of the right breast. LABS:  None obtained at the time of imaging. EXAM: BILATERAL BREAST MRI WITH AND WITHOUT CONTRAST TECHNIQUE: Multiplanar, multisequence MR images of both breasts were obtained prior to and following the intravenous administration of 6 ml of Gadavist Three-dimensional MR images were rendered by post-processing of the original MR data on an independent workstation. The three-dimensional MR images were interpreted, and findings are reported in the following complete MRI report for this study. Three dimensional images were evaluated at the independent DynaCad workstation COMPARISON:  Previous exam(s). FINDINGS: Breast composition: c. Heterogeneous fibroglandular tissue. Background parenchymal enhancement: Moderate. Right breast: There is enhancing 2.1 x 2.3 x 1.7 cm mass in the 12 o'clock region of the right breast. There is a signal void artifact in the mass corresponding with the biopsy clip. Signal void artifact and post biopsy change is seen lateral to the enhancing mass from the biopsied mass at 11 o'clock. There is no discrete mass in this area. Left breast: No mass or abnormal enhancement. Lymph nodes: No abnormal appearing lymph nodes. Ancillary findings:  None. IMPRESSION: 2.3 cm enhancing mass in the 12 o'clock region of the right breast corresponding with the biopsy  proven invasive ductal carcinoma and ductal carcinoma in-situ. Signal void artifact and post biopsy change is seen in the 11 o'clock region of the right breast where invasive ductal carcinoma was also biopsied. There is no discrete mass in this area. RECOMMENDATION: Treatment planning of the 2 biopsy proven malignancies in the right breast is recommended. BI-RADS CATEGORY  6: Known biopsy-proven malignancy. Electronically Signed: By: Lillia Mountain M.D. On: 12/04/2018 10:33   Mm Clip Placement Right  Result Date: 11/21/2018 CLINICAL DATA:  Post biopsy mammogram of the right breast for clip placement. EXAM: DIAGNOSTIC RIGHT MAMMOGRAM POST ULTRASOUND BIOPSY COMPARISON:  Previous exam(s). FINDINGS: Mammographic images were obtained following ultrasound guided biopsy of 2 right breast masses. The ribbon shaped biopsy marking clip is positioned at the posterosuperior margin of the targeted mass. The coil shaped biopsy marking clip is positioned at the site of the second biopsy in the upper outer right breast. The 2 clips are approximately 2.5 cm apart. IMPRESSION: Appropriate positioning of the ribbon shaped biopsy marking clip at the site of the right breast mass at 12 o'clock. Appropriate positioning of the coil shaped biopsy marking clip at the site of the mass at 11 o'clock in the right breast. The 2 clips lie at 2.5 cm apart. Final Assessment: Post Procedure Mammograms for Marker Placement Electronically Signed   By: Ammie Ferrier M.D.   On: 11/21/2018 16:23  [place] Rt Breast Bx W Loc Dev 1st Lesion Img Bx Spec US Guide  Addendum Date: 11/22/2018   ADDENDUM REPORT: 11/22/2018 14:20 ADDENDUM: Pathology revealed GRADE II INVASIVE DUCTAL CARCINOMA, HIGH GRADE DUCTAL CARCINOMA IN SITU of the Right breast, 12 o'clock. GRADE III INVASIVE DUCTAL CARCINOMA, CALCIFICATIONS of the Right breast, 11 o'clock. This was found to be concordant by Dr. Ammie Ferrier. Pathology results were discussed with the patient by  telephone. The patient reported doing well after the biopsies with tenderness at the sites. Post biopsy instructions and care were reviewed and questions were answered. The patient was encouraged to call [facility] for any additional concerns. The patient was referred to [facility] on November 29, 2018. A bilateral breast MRI is suggested for evaluation of extent of disease, and to help determine what additional biopsy(ies) may be warranted to evaluate for extent of disease, if breast conservation will be considered. If no additional suspicious areas are identified on breast MRI, then 2 stereotactic biopsies, of the small groups of calcifications, may be warranted. Pathology results reported by Terie Purser, RN on 11/22/2018. Electronically Signed   By: Ammie Ferrier M.D.   On: 11/22/2018 14:20   Result Date: 11/22/2018 CLINICAL DATA:  58 year old female presenting for ultrasound-guided biopsies of the right breast. EXAM: ULTRASOUND GUIDED RIGHT BREAST CORE NEEDLE BIOPSY COMPARISON:  Previous exam(s). FINDINGS: I met with the patient and we discussed the procedure of ultrasound-guided biopsy, including benefits and alternatives. We discussed the high likelihood of a successful procedure. We discussed the risks of the procedure, including infection, bleeding, tissue injury, clip migration, and inadequate sampling. Informed written consent was given. The usual time-out protocol was performed immediately prior to the procedure. #1 Lesion quadrant: Upper outer quadrant Using sterile technique and 1% Lidocaine as local anesthetic, under direct ultrasound visualization, a 14 gauge spring-loaded device was used to perform biopsy of a mass in the right breast 12 o'clock using an inferolateral approach. At the conclusion of the procedure a ribbon shaped tissue marker clip was deployed into the biopsy cavity.  -------------------------------------------------------------------------------------------------------------------------------------------- #2 Lesion quadrant: Upper-outer quadrant Using sterile technique and 1% Lidocaine as local anesthetic, under direct ultrasound visualization, a 14 gauge spring-loaded device was used to perform biopsy of a mass in the right breast at 11 o'clock using a lateral approach through the same incision site as the biopsy above. At the conclusion of the procedure a coil shaped tissue marker clip was deployed into the biopsy cavity. Follow up 2 view mammogram was performed and dictated separately. IMPRESSION: 1. Ultrasound guided biopsy of a right breast mass at 12 o'clock. No apparent complications. 2. Ultrasound guided biopsy of a right breast mass at 11 o'clock. No apparent complications. Electronically Signed: By: Ammie Ferrier M.D. On: 11/21/2018 16:10   [place] Rt Breast Bx W Loc Dev Ea Add Lesion Img Bx Spec US Guide  Addendum Date: 11/22/2018   ADDENDUM REPORT: 11/22/2018 14:20 ADDENDUM: Pathology revealed GRADE II INVASIVE DUCTAL CARCINOMA, HIGH GRADE DUCTAL CARCINOMA IN SITU of the Right breast, 12 o'clock. GRADE III INVASIVE DUCTAL CARCINOMA, CALCIFICATIONS of the Right breast, 11 o'clock. This was found to be concordant by Dr. Ammie Ferrier. Pathology results were discussed with the patient by telephone. The patient reported doing well after the biopsies with tenderness at the sites. Post biopsy instructions and care were reviewed and questions were answered. The patient was encouraged to call [facility]  [facility] for any additional concerns. The patient was referred to [facility] on November 29, 2018. A bilateral breast MRI is suggested for evaluation of extent of disease, and to help determine what additional biopsy(ies) may be warranted to evaluate for extent of disease, if  breast conservation will be considered. If no additional suspicious areas are identified on breast MRI, then 2 stereotactic biopsies, of the small groups of calcifications, may be warranted. Pathology results reported by Terie Purser, RN on 11/22/2018. Electronically Signed   By: Ammie Ferrier M.D.   On: 11/22/2018 14:20   Result Date: 11/22/2018 CLINICAL DATA:  58 year old female presenting for ultrasound-guided biopsies of the right breast. EXAM: ULTRASOUND GUIDED RIGHT BREAST CORE NEEDLE BIOPSY COMPARISON:  Previous exam(s). FINDINGS: I met with the patient and we discussed the procedure of ultrasound-guided biopsy, including benefits and alternatives. We discussed the high likelihood of a successful procedure. We discussed the risks of the procedure, including infection, bleeding, tissue injury, clip migration, and inadequate sampling. Informed written consent was given. The usual time-out protocol was performed immediately prior to the procedure. #1 Lesion quadrant: Upper outer quadrant Using sterile technique and 1% Lidocaine as local anesthetic, under direct ultrasound visualization, a 14 gauge spring-loaded device was used to perform biopsy of a mass in the right breast 12 o'clock using an inferolateral approach. At the conclusion of the procedure a ribbon shaped tissue marker clip was deployed into the biopsy cavity. -------------------------------------------------------------------------------------------------------------------------------------------- #2 Lesion quadrant: Upper-outer quadrant Using sterile technique and 1% Lidocaine as local anesthetic, under direct ultrasound visualization, a 14 gauge spring-loaded device was used to perform biopsy of a mass in the right breast at 11 o'clock using a lateral approach through the same incision site as the biopsy above. At the conclusion of the procedure a coil shaped tissue marker clip was deployed into the biopsy cavity. Follow up 2 view mammogram  was performed and dictated separately. IMPRESSION: 1. Ultrasound guided biopsy of a right breast mass at 12 o'clock. No apparent complications. 2. Ultrasound guided biopsy of a right breast mass at 11 o'clock. No apparent complications. Electronically Signed: By: Ammie Ferrier M.D. On: 11/21/2018 16:10

## 2018-12-21 NOTE — Telephone Encounter (Addendum)
Pt called to notify MD that her back pain was not helping with tramadol and would like to request a stronger pain medication. Per Dr.Gudena's note from pt last visit, pt may take percocet for pain. Pt would like this sent to [facility] on [address]. Will request for Dr.Gudena to send percocet prescription to pharmacy today. Pt very appreciative of call.

## 2018-12-25 NOTE — Telephone Encounter (Signed)
Received fax from [facility] that [name] already completed and approved for a 30 days supply at a time. Specialty drugs are limited to a 30 days supply. Ref#: [id number].

## 2018-12-27 NOTE — Telephone Encounter (Signed)
PA approve till 10/25/2019 for [employer]. Number for any issues call [place] at [phone].

## 2018-12-29 NOTE — Progress Notes (Signed)
Patient Care Team: Neva Seat, MD as PCP - General (Internal Medicine) Excell Seltzer, MD as Consulting Physician (General Surgery) Nicholas Lose, MD as Consulting Physician (Hematology and Oncology) Kyung Rudd, MD as Consulting Physician (Radiation Oncology)  DIAGNOSIS:    ICD-10-CM   1. Malignant neoplasm of upper-outer quadrant of right breast in female, estrogen receptor positive ([place]) C50.411    Z17.0     SUMMARY OF ONCOLOGIC HISTORY:   Malignant neoplasm of upper-outer quadrant of right breast in female, estrogen receptor positive ([place])   11/21/2018 Initial Diagnosis    Screening detected right breast mass upper outer quadrant, in addition to areas of calcifications which were not biopsied.  By ultrasound she had 2 breast masses 12 o'clock position 2.9 cm: Grade 2 IDC with high-grade DCIS ER 100%, PR 70%, Ki-67 10%, HER-2 3+ by IHC and 11 o'clock position 1 cm, ER 90%, PR 50%, Ki-67 15%, HER-2 3+ by IHC, T2N0 stage Ib    11/29/2018 Cancer Staging    Staging form: Breast, AJCC 8th Edition - Clinical stage from 11/29/2018: Stage IB (cT2, cN0, cM0, G3, ER+, PR+, HER2+) - Signed by Nicholas Lose, MD on 11/29/2018    12/12/2018 -  Neo-Adjuvant Chemotherapy    Neoadjuvant chemotherapy with TCH Perjeta     CHIEF COMPLIANT: Cycle 2 TCH Perjeta  INTERVAL HISTORY: Debbie Watts is a 58 y.o. with above-mentioned history of right breast cancer currently on neoadjuvant chemotherapy with TCHP. She presents to the clinic alone today. Her labs from today show: WBC 5.8, Hg 11.7, ANC 0.5, platelets 406. She reports occasional, mild nausea, severe fatigue, hair loss, and frequent diarrhea, about 3-4 times daily, for which she has been using Imodium 1-3 tablets per day with relief. She has been helping her boyfriend, who is a double amputee with heart failure, which has increased her fatigue. She reports her insurance will not cover all parts of her treatment, which is impacting her surgery  decision, and she has been in contact with financial resource department for assistance.   REVIEW OF SYSTEMS:   Constitutional: Denies fevers, chills or abnormal weight loss (+) fatigue (+) hair loss  Eyes: Denies blurriness of vision Ears, nose, mouth, throat, and face: Denies mucositis or sore throat Respiratory: Denies cough, dyspnea or wheezes Cardiovascular: Denies palpitation, chest discomfort Gastrointestinal: Denies heartburn (+) frequent diarrhea (+) nausea Skin: Denies abnormal skin rashes Lymphatics: Denies new lymphadenopathy or easy bruising Neurological: Denies numbness, tingling or new weaknesses Behavioral/Psych: Mood is stable, no new changes  Extremities: No lower extremity edema Breast: denies any pain or lumps or nodules in either breasts All other systems were reviewed with the patient and are negative.  I have reviewed the past medical history, past surgical history, social history and family history with the patient and they are unchanged from previous note.  ALLERGIES:  is allergic to procaine.  MEDICATIONS:  Current Outpatient Medications  Medication Sig Dispense Refill  . Acetaminophen-Caffeine (TENSION HEADACHE RELIEF PO) Take by mouth.    . betamethasone valerate lotion (VALISONE) 0.1 % APPLY 1 APPLICATION TOPICALLY 2 TIMES DAILY.    . carbidopa-levodopa (SINEMET IR) 10-100 MG tablet TAKE 1 TABLET BY MOUTH AT BEDTIME. 30 tablet 5  . cyclobenzaprine (FLEXERIL) 10 MG tablet TAKE 1 TABLET BY MOUTH EVERYDAY AT BEDTIME 30 tablet 1  . dexamethasone (DECADRON) 4 MG tablet Take 1 tablet (4 mg total) by mouth daily. Take 1 tablet day before chemo and 1 tablet day after chemo with food 12 tablet  0  . diclofenac (VOLTAREN) 75 MG EC tablet TAKE 1 TABLET BY MOUTH TWICE A DAY 60 tablet 5  . Dimethyl Fumarate 240 MG CPDR Take 1 capsule (240 mg total) by mouth 2 (two) times daily. 180 capsule 3  . eletriptan (RELPAX) 40 MG tablet Take 40 mg by mouth as needed for migraine  or headache. One tablet by mouth at onset of headache. May repeat in 2 hours if headache persists or recurs.    . ferrous sulfate 325 (65 FE) MG tablet Take 325 mg by mouth daily.    . fluconazole (DIFLUCAN) 100 MG tablet Take 1 tablet (100 mg total) by mouth daily. 30 tablet 0  . gabapentin (NEURONTIN) 300 MG capsule TAKE 2 CAPSULES (600 MG TOTAL) BY MOUTH 2 (TWO) TIMES DAILY. 360 capsule 1  . lidocaine-prilocaine (EMLA) cream Apply to affected area once 30 g 3  . lisinopril (PRINIVIL,ZESTRIL) 5 MG tablet TAKE 1 TABLET BY MOUTH EVERY DAY 90 tablet 3  . LORazepam (ATIVAN) 0.5 MG tablet Take 1 tablet (0.5 mg total) by mouth at bedtime as needed (Nausea or vomiting). 30 tablet 0  . ondansetron (ZOFRAN) 8 MG tablet Take 1 tablet (8 mg total) by mouth 2 (two) times daily as needed (Nausea or vomiting). Begin 4 days after chemotherapy. 30 tablet 1  . oxyCODONE-acetaminophen (PERCOCET/ROXICET) 5-325 MG tablet Take 1 tablet by mouth every 8 (eight) hours as needed for severe pain. 30 tablet 0  . PARoxetine (PAXIL) 20 MG tablet Take 1 tablet (20 mg total) by mouth daily. 90 tablet 3  . prochlorperazine (COMPAZINE) 10 MG tablet Take 1 tablet (10 mg total) by mouth every 6 (six) hours as needed (Nausea or vomiting). 30 tablet 1  . Tiotropium Bromide Monohydrate 1.25 MCG/ACT AERS Inhale into the lungs.    . traMADol (ULTRAM) 50 MG tablet TAKE 1 TABLET BY MOUTH TWICE A DAY AS NEEDED 60 tablet 0  . VENTOLIN HFA 108 (90 Base) MCG/ACT inhaler Inhale 1-2 puffs into the lungs every 6 (six) hours as needed for wheezing or shortness of breath. 1 Inhaler 3   No current facility-administered medications for this visit.    Facility-Administered Medications Ordered in Other Visits  Medication Dose Route Frequency Provider Last Rate Last Dose  . sodium chloride flush (NS) 0.9 % injection 10 mL  10 mL Intravenous PRN Nicholas Lose, MD   10 mL at 01/02/19 1144    PHYSICAL EXAMINATION: ECOG PERFORMANCE STATUS: 1 -  Symptomatic but completely ambulatory  Vitals:   01/02/19 1200  BP: 130/78  Pulse: 92  Resp: 17  Temp: 98.1 F (36.7 C)  SpO2: 99%   Filed Weights   01/02/19 1200  Weight: 141 lb 4.8 oz (64.1 kg)    GENERAL: alert, no distress and comfortable SKIN: skin color, texture, turgor are normal, no rashes or significant lesions EYES: normal, Conjunctiva are pink and non-injected, sclera clear OROPHARYNX: no exudate, no erythema and lips, buccal mucosa, and tongue normal  NECK: supple, thyroid normal size, non-tender, without nodularity LYMPH: no palpable lymphadenopathy in the cervical, axillary or inguinal LUNGS: clear to auscultation and percussion with normal breathing effort HEART: regular rate & rhythm and no murmurs and no lower extremity edema ABDOMEN: abdomen soft, non-tender and normal bowel sounds MUSCULOSKELETAL: no cyanosis of digits and no clubbing  NEURO: alert & oriented x 3 with fluent speech, no focal motor/sensory deficits EXTREMITIES: No lower extremity edema  LABORATORY DATA:  I have reviewed the data as listed CMP  Latest Ref Rng & Units 01/02/2019 12/18/2018 12/12/2018  Glucose 70 - 99 mg/dL 103(H) 102(H) 93  BUN 6 - 20 mg/dL _0 Creatinine 0.44 - 1.00 mg/dL 0.67 0.65 0.78  Sodium 135 - 145 mmol/L 139 139 142  Potassium 3.5 - 5.1 mmol/L 4.0 3.6 4.3  Chloride 98 - 111 mmol/L 105 102 105  CO2 22 - 32 mmol/L _1 Calcium 8.9 - 10.3 mg/dL 8.2(L) 8.5(L) 8.9  Total Protein 6.5 - 8.1 g/dL 6.1(L) 6.1(L) 6.9  Total Bilirubin 0.3 - 1.2 mg/dL 0.3 0.6 0.5  Alkaline Phos 38 - 126 U/L 97 95 81  AST 15 - 41 U/L 32 23 20  ALT 0 - 44 U/L 93(H) 49(H) 35    Lab Results  Component Value Date   WBC 5.8 01/02/2019   HGB 11.7 (L) 01/02/2019   HCT 36.0 01/02/2019   MCV 92.1 01/02/2019   PLT 406 (H) 01/02/2019   NEUTROABS 4.7 01/02/2019    ASSESSMENT & PLAN:  Malignant neoplasm of upper-outer quadrant of right breast in female, estrogen receptor positive  ([place]) 11/21/2018:Screening detected right breast mass upper outer quadrant, in addition to areas of calcifications which were not biopsied. By ultrasound she had 2 breast masses 12 o'clock position 2.9 cm: Grade 2 IDC with high-grade DCIS ER 100%, PR 70%, Ki-67 10%, HER-2 3+ by IHC and 11 o'clock position 1 cm, ER 90%, PR 50%, Ki-67 15%, HER-2 3+ by IHC, T2N0 stage Ib  Recommendation: 1. Neoadjuvant chemotherapy with TCH Perjeta 6 cycles followed by Herceptinand Perjeta versus Kadcylamaintenance for 1 year 2. Followed bymastectomy versusbreast conserving surgery with sentinel lymph node study 3. Followed by adjuvant radiation therapy if patient had lumpectomy 4.Followed by adjuvant antiestrogen therapy  Breast MRI 12/03/2018: 2.3 cm enhancing mass 4 o'clock position right breast, 11 o'clock position biopsy-proven malignancy area did not show any discrete mass -------------------------------------------------------------------------------------------------------------------------------------------------------- Current treatment: Tomorrow is Cycle 2 day 1 TCH Perjetastarted 12/13/2018 Labs reviewed Echocardiogram: 12/06/2018: EF greater than 65%  Chemo toxicities: 1.  Bone pain especially low back pain as result of Neulasta: I discussed with her about Claritin with each treatment 2.  Diarrhea: Well controlled with Imodium 3.  Mild nausea 4.  Migraine headache: Improved with Excedrin 5.  Fatigue due to chemotherapy  Return to clinic in 3 weeks for cycle  3  No orders of the defined types were placed in this encounter.  The patient has a good understanding of the overall plan. she agrees with it. she will call with any problems that may develop before the next visit here.  Nicholas Lose, MD 01/02/2019  Julious Oka Dorshimer am acting as scribe for Dr. Nicholas Lose.  I have reviewed the above documentation for accuracy and completeness, and I agree with the above.

## 2019-01-02 ENCOUNTER — Inpatient Hospital Stay: Payer: Medicare HMO

## 2019-01-02 ENCOUNTER — Inpatient Hospital Stay: Payer: Medicare HMO | Attending: Hematology and Oncology | Admitting: Hematology and Oncology

## 2019-01-02 DIAGNOSIS — Z5111 Encounter for antineoplastic chemotherapy: Secondary | ICD-10-CM | POA: Diagnosis not present

## 2019-01-02 DIAGNOSIS — C50411 Malignant neoplasm of upper-outer quadrant of right female breast: Secondary | ICD-10-CM | POA: Insufficient documentation

## 2019-01-02 DIAGNOSIS — R530 Neoplastic (malignant) related fatigue: Secondary | ICD-10-CM | POA: Diagnosis not present

## 2019-01-02 DIAGNOSIS — Z5189 Encounter for other specified aftercare: Secondary | ICD-10-CM | POA: Diagnosis not present

## 2019-01-02 DIAGNOSIS — Z5112 Encounter for antineoplastic immunotherapy: Secondary | ICD-10-CM | POA: Diagnosis not present

## 2019-01-02 DIAGNOSIS — R197 Diarrhea, unspecified: Secondary | ICD-10-CM

## 2019-01-02 DIAGNOSIS — Z170 Estrogen receptor positive status [ER+]: Principal | ICD-10-CM

## 2019-01-02 DIAGNOSIS — R110 Nausea: Secondary | ICD-10-CM

## 2019-01-02 DIAGNOSIS — M898X9 Other specified disorders of bone, unspecified site: Secondary | ICD-10-CM

## 2019-01-02 DIAGNOSIS — L659 Nonscarring hair loss, unspecified: Secondary | ICD-10-CM | POA: Diagnosis not present

## 2019-01-02 DIAGNOSIS — Z95828 Presence of other vascular implants and grafts: Secondary | ICD-10-CM

## 2019-01-02 LAB — CMP (CANCER CENTER ONLY)
ALT: 93 U/L — ABNORMAL HIGH (ref 0–44)
AST: 32 U/L (ref 15–41)
Albumin: 3.6 g/dL (ref 3.5–5.0)
Alkaline Phosphatase: 97 U/L (ref 38–126)
Anion gap: 8 (ref 5–15)
BUN: 18 mg/dL (ref 6–20)
CHLORIDE: 105 mmol/L (ref 98–111)
CO2: 26 mmol/L (ref 22–32)
Calcium: 8.2 mg/dL — ABNORMAL LOW (ref 8.9–10.3)
Creatinine: 0.67 mg/dL (ref 0.44–1.00)
GFR, Est AFR Am: >60 mL/min (ref >60)
GFR, Estimated: >60 mL/min (ref >60)
Glucose, Bld: 103 mg/dL — ABNORMAL HIGH (ref 70–99)
Potassium: 4 mmol/L (ref 3.5–5.1)
Sodium: 139 mmol/L (ref 135–145)
Total Bilirubin: 0.3 mg/dL (ref 0.3–1.2)
Total Protein: 6.1 g/dL — ABNORMAL LOW (ref 6.5–8.1)

## 2019-01-02 LAB — CBC WITH DIFFERENTIAL (CANCER CENTER ONLY)
Abs Immature Granulocytes: 0.02 10*3/uL (ref 0.00–0.07)
Basophils Absolute: 0.1 10*3/uL (ref 0.0–0.1)
Basophils Relative: 1 %
Eosinophils Absolute: 0.1 10*3/uL (ref 0.0–0.5)
Eosinophils Relative: 1 %
HCT: 36 % (ref 36.0–46.0)
HEMOGLOBIN: 11.7 g/dL — AB (ref 12.0–15.0)
Immature Granulocytes: 0 %
Lymphocytes Relative: 9 %
Lymphs Abs: 0.5 10*3/uL — ABNORMAL LOW (ref 0.7–4.0)
MCH: 29.9 pg (ref 26.0–34.0)
MCHC: 32.5 g/dL (ref 30.0–36.0)
MCV: 92.1 fL (ref 80.0–100.0)
MONOS PCT: 8 %
Monocytes Absolute: 0.5 10*3/uL (ref 0.1–1.0)
Neutro Abs: 4.7 10*3/uL (ref 1.7–7.7)
Neutrophils Relative %: 81 %
Platelet Count: 406 10*3/uL — ABNORMAL HIGH (ref 150–400)
RBC: 3.91 MIL/uL (ref 3.87–5.11)
RDW: 14.1 % (ref 11.5–15.5)
WBC Count: 5.8 10*3/uL (ref 4.0–10.5)
nRBC: 0 % (ref 0.0–0.2)

## 2019-01-02 MED ORDER — SODIUM CHLORIDE 0.9% FLUSH
10.0000 mL | INTRAVENOUS | Status: DC | PRN
  Administered 2019-01-02: 10 mL via INTRAVENOUS
  Filled 2019-01-02: qty 10

## 2019-01-02 MED ORDER — HEPARIN SOD (PORK) LOCK FLUSH 100 UNIT/ML IV SOLN
500.0000 [IU] | Freq: Once | INTRAVENOUS | Status: AC
  Administered 2019-01-02: 500 [IU] via INTRAVENOUS
  Filled 2019-01-02: qty 5

## 2019-01-02 NOTE — Assessment & Plan Note (Signed)
11/21/2018:Screening detected right breast mass upper outer quadrant, in addition to areas of calcifications which were not biopsied. By ultrasound she had 2 breast masses 12 o'clock position 2.9 cm: Grade 2 IDC with high-grade DCIS ER 100%, PR 70%, Ki-67 10%, HER-2 3+ by IHC and 11 o'clock position 1 cm, ER 90%, PR 50%, Ki-67 15%, HER-2 3+ by IHC, T2N0 stage Ib  Recommendation: 1. Neoadjuvant chemotherapy with TCH Perjeta 6 cycles followed by Herceptinand Perjeta versus Kadcylamaintenance for 1 year 2. Followed bymastectomy versusbreast conserving surgery with sentinel lymph node study 3. Followed by adjuvant radiation therapy if patient had lumpectomy 4.Followed by adjuvant antiestrogen therapy  Breast MRI 12/03/2018: 2.3 cm enhancing mass 4 o'clock position right breast, 11 o'clock position biopsy-proven malignancy area did not show any discrete mass -------------------------------------------------------------------------------------------------------------------------------------------------------- Current treatment: Cycle 2 day 1 TCH Perjetastarted 12/13/2018 Labs reviewed Echocardiogram: 12/06/2018: EF greater than 65%  Chemo toxicities: 1.  Bone pain especially low back pain as result of Neulasta: I discussed with her about Claritin with each treatment 2.  Diarrhea: Well controlled with Imodium 3.  Mild nausea 4.  Migraine headache: Improved with Excedrin 5.  Fatigue due to chemotherapy  Return to clinic in 3 weeks for cycle 3

## 2019-01-03 ENCOUNTER — Other Ambulatory Visit: Payer: Self-pay

## 2019-01-03 ENCOUNTER — Inpatient Hospital Stay: Payer: Medicare HMO

## 2019-01-03 VITALS — BP 119/73 | HR 95 | Temp 98.0°F | Resp 18

## 2019-01-03 DIAGNOSIS — Z170 Estrogen receptor positive status [ER+]: Principal | ICD-10-CM

## 2019-01-03 DIAGNOSIS — Z5111 Encounter for antineoplastic chemotherapy: Secondary | ICD-10-CM | POA: Diagnosis not present

## 2019-01-03 DIAGNOSIS — C50411 Malignant neoplasm of upper-outer quadrant of right female breast: Secondary | ICD-10-CM

## 2019-01-03 DIAGNOSIS — Z5189 Encounter for other specified aftercare: Secondary | ICD-10-CM | POA: Diagnosis not present

## 2019-01-03 DIAGNOSIS — Z5112 Encounter for antineoplastic immunotherapy: Secondary | ICD-10-CM | POA: Diagnosis not present

## 2019-01-03 MED ORDER — SODIUM CHLORIDE 0.9 % IV SOLN
Freq: Once | INTRAVENOUS | Status: AC
  Administered 2019-01-03: 09:00:00 via INTRAVENOUS
  Filled 2019-01-03: qty 5

## 2019-01-03 MED ORDER — HEPARIN SOD (PORK) LOCK FLUSH 100 UNIT/ML IV SOLN
500.0000 [IU] | Freq: Once | INTRAVENOUS | Status: AC | PRN
  Administered 2019-01-03: 500 [IU]
  Filled 2019-01-03: qty 5

## 2019-01-03 MED ORDER — SODIUM CHLORIDE 0.9% FLUSH
10.0000 mL | INTRAVENOUS | Status: DC | PRN
  Administered 2019-01-03: 10 mL
  Filled 2019-01-03: qty 10

## 2019-01-03 MED ORDER — SODIUM CHLORIDE 0.9 % IV SOLN
Freq: Once | INTRAVENOUS | Status: AC
  Administered 2019-01-03: 09:00:00 via INTRAVENOUS
  Filled 2019-01-03: qty 250

## 2019-01-03 MED ORDER — TRASTUZUMAB CHEMO 150 MG IV SOLR
6.0000 mg/kg | Freq: Once | INTRAVENOUS | Status: AC
  Administered 2019-01-03: 378 mg via INTRAVENOUS
  Filled 2019-01-03: qty 18

## 2019-01-03 MED ORDER — PALONOSETRON HCL INJECTION 0.25 MG/5ML
0.2500 mg | Freq: Once | INTRAVENOUS | Status: AC
  Administered 2019-01-03: 0.25 mg via INTRAVENOUS

## 2019-01-03 MED ORDER — SODIUM CHLORIDE 0.9 % IV SOLN
420.0000 mg | Freq: Once | INTRAVENOUS | Status: AC
  Administered 2019-01-03: 420 mg via INTRAVENOUS
  Filled 2019-01-03: qty 14

## 2019-01-03 MED ORDER — PALONOSETRON HCL INJECTION 0.25 MG/5ML
INTRAVENOUS | Status: AC
  Filled 2019-01-03: qty 5

## 2019-01-03 MED ORDER — SODIUM CHLORIDE 0.9 % IV SOLN
75.0000 mg/m2 | Freq: Once | INTRAVENOUS | Status: AC
  Administered 2019-01-03: 120 mg via INTRAVENOUS
  Filled 2019-01-03: qty 12

## 2019-01-03 MED ORDER — SODIUM CHLORIDE 0.9 % IV SOLN
618.6000 mg | Freq: Once | INTRAVENOUS | Status: AC
  Administered 2019-01-03: 620 mg via INTRAVENOUS
  Filled 2019-01-03: qty 62

## 2019-01-03 MED ORDER — DIPHENHYDRAMINE HCL 25 MG PO CAPS
50.0000 mg | ORAL_CAPSULE | Freq: Once | ORAL | Status: AC
  Administered 2019-01-03: 50 mg via ORAL

## 2019-01-03 MED ORDER — ACETAMINOPHEN 325 MG PO TABS
ORAL_TABLET | ORAL | Status: AC
  Filled 2019-01-03: qty 2

## 2019-01-03 MED ORDER — ACETAMINOPHEN 325 MG PO TABS
650.0000 mg | ORAL_TABLET | Freq: Once | ORAL | Status: AC
  Administered 2019-01-03: 650 mg via ORAL

## 2019-01-03 MED ORDER — DIPHENHYDRAMINE HCL 25 MG PO CAPS
ORAL_CAPSULE | ORAL | Status: AC
  Filled 2019-01-03: qty 2

## 2019-01-03 NOTE — Patient Instructions (Signed)
[facility] Discharge Instructions for Patients Receiving Chemotherapy  Today you received the following chemotherapy agents: Herceptin, Perjeta, Taxotere, Carboplatin.  To help prevent nausea and vomiting after your treatment, we encourage you to take your nausea medication as directed.   If you develop nausea and vomiting that is not controlled by your nausea medication, call the clinic.   BELOW ARE SYMPTOMS THAT SHOULD BE REPORTED IMMEDIATELY:  *FEVER GREATER THAN 100.5 F  *CHILLS WITH OR WITHOUT FEVER  NAUSEA AND VOMITING THAT IS NOT CONTROLLED WITH YOUR NAUSEA MEDICATION  *UNUSUAL SHORTNESS OF BREATH  *UNUSUAL BRUISING OR BLEEDING  TENDERNESS IN MOUTH AND THROAT WITH OR WITHOUT PRESENCE OF ULCERS  *URINARY PROBLEMS  *BOWEL PROBLEMS  UNUSUAL RASH Items with * indicate a potential emergency and should be followed up as soon as possible.  Feel free to call the clinic should you have any questions or concerns. The clinic phone number is [phone].  Please show the CHEMO ALERT CARD at check-in to the Emergency Department and triage nurse.   

## 2019-01-03 NOTE — Progress Notes (Signed)
Per Dr. Lindi Adie it is ok to treat today with ALT of 93

## 2019-01-04 ENCOUNTER — Encounter: Payer: Medicare HMO | Admitting: Internal Medicine

## 2019-01-05 ENCOUNTER — Inpatient Hospital Stay: Payer: Medicare HMO

## 2019-01-05 ENCOUNTER — Other Ambulatory Visit: Payer: Self-pay

## 2019-01-05 DIAGNOSIS — Z170 Estrogen receptor positive status [ER+]: Principal | ICD-10-CM

## 2019-01-05 DIAGNOSIS — C50411 Malignant neoplasm of upper-outer quadrant of right female breast: Secondary | ICD-10-CM | POA: Diagnosis not present

## 2019-01-05 DIAGNOSIS — Z5111 Encounter for antineoplastic chemotherapy: Secondary | ICD-10-CM | POA: Diagnosis not present

## 2019-01-05 DIAGNOSIS — Z5112 Encounter for antineoplastic immunotherapy: Secondary | ICD-10-CM | POA: Diagnosis not present

## 2019-01-05 DIAGNOSIS — Z5189 Encounter for other specified aftercare: Secondary | ICD-10-CM | POA: Diagnosis not present

## 2019-01-05 MED ORDER — PEGFILGRASTIM-CBQV 6 MG/0.6ML ~~LOC~~ SOSY
PREFILLED_SYRINGE | SUBCUTANEOUS | Status: AC
  Filled 2019-01-05: qty 0.6

## 2019-01-05 MED ORDER — PEGFILGRASTIM-CBQV 6 MG/0.6ML ~~LOC~~ SOSY
6.0000 mg | PREFILLED_SYRINGE | Freq: Once | SUBCUTANEOUS | Status: AC
  Administered 2019-01-05: 6 mg via SUBCUTANEOUS

## 2019-01-05 NOTE — Patient Instructions (Signed)
Pegfilgrastim injection  What is this medicine?  PEGFILGRASTIM (PEG fil gra stim) is a long-acting granulocyte colony-stimulating factor that stimulates the growth of neutrophils, a type of white blood cell important in the body's fight against infection. It is used to reduce the incidence of fever and infection in patients with certain types of cancer who are receiving chemotherapy that affects the bone marrow, and to increase survival after being exposed to high doses of radiation.  This medicine may be used for other purposes; ask your health care provider or pharmacist if you have questions.  COMMON BRAND NAME(S): Fulphila, Neulasta, UDENYCA  What should I tell my health care provider before I take this medicine?  They need to know if you have any of these conditions:  -kidney disease  -latex allergy  -ongoing radiation therapy  -sickle cell disease  -skin reactions to acrylic adhesives (On-Body Injector only)  -an unusual or allergic reaction to pegfilgrastim, filgrastim, other medicines, foods, dyes, or preservatives  -pregnant or trying to get pregnant  -breast-feeding  How should I use this medicine?  This medicine is for injection under the skin. If you get this medicine at home, you will be taught how to prepare and give the pre-filled syringe or how to use the On-body Injector. Refer to the patient Instructions for Use for detailed instructions. Use exactly as directed. Tell your healthcare provider immediately if you suspect that the On-body Injector may not have performed as intended or if you suspect the use of the On-body Injector resulted in a missed or partial dose.  It is important that you put your used needles and syringes in a special sharps container. Do not put them in a trash can. If you do not have a sharps container, call your pharmacist or healthcare provider to get one.  Talk to your pediatrician regarding the use of this medicine in children. While this drug may be prescribed for  selected conditions, precautions do apply.  Overdosage: If you think you have taken too much of this medicine contact a poison control center or emergency room at once.  NOTE: This medicine is only for you. Do not share this medicine with others.  What if I miss a dose?  It is important not to miss your dose. Call your doctor or health care professional if you miss your dose. If you miss a dose due to an On-body Injector failure or leakage, a new dose should be administered as soon as possible using a single prefilled syringe for manual use.  What may interact with this medicine?  Interactions have not been studied.  Give your health care provider a list of all the medicines, herbs, non-prescription drugs, or dietary supplements you use. Also tell them if you smoke, drink alcohol, or use illegal drugs. Some items may interact with your medicine.  This list may not describe all possible interactions. Give your health care provider a list of all the medicines, herbs, non-prescription drugs, or dietary supplements you use. Also tell them if you smoke, drink alcohol, or use illegal drugs. Some items may interact with your medicine.  What should I watch for while using this medicine?  You may need blood work done while you are taking this medicine.  If you are going to need a MRI, CT scan, or other procedure, tell your doctor that you are using this medicine (On-Body Injector only).  What side effects may I notice from receiving this medicine?  Side effects that you should report to   your doctor or health care professional as soon as possible:  -allergic reactions like skin rash, itching or hives, swelling of the face, lips, or tongue  -back pain  -dizziness  -fever  -pain, redness, or irritation at site where injected  -pinpoint red spots on the skin  -red or dark-brown urine  -shortness of breath or breathing problems  -stomach or side pain, or pain at the shoulder  -swelling  -tiredness  -trouble passing urine or  change in the amount of urine  Side effects that usually do not require medical attention (report to your doctor or health care professional if they continue or are bothersome):  -bone pain  -muscle pain  This list may not describe all possible side effects. Call your doctor for medical advice about side effects. You may report side effects to FDA at 1-800-FDA-1088.  Where should I keep my medicine?  Keep out of the reach of children.  If you are using this medicine at home, you will be instructed on how to store it. Throw away any unused medicine after the expiration date on the label.  NOTE: This sheet is a summary. It may not cover all possible information. If you have questions about this medicine, talk to your doctor, pharmacist, or health care provider.   2019 Elsevier/Gold Standard (2018-01-16 16:57:08)

## 2019-01-09 ENCOUNTER — Telehealth: Payer: Self-pay

## 2019-01-09 NOTE — Telephone Encounter (Signed)
Pt called to report that she is having a sore throat since Sunday and wants to know if she needs to be seen today. Pt feels like she is coming down with a cold. Pt is 1 week post tchp and udenyca injection. Pt denies any fevers, cough, sob, headaches, muscle ache at this time. Pt does report vomiting  For a few days and decreased appetite. She is, however, hydrating well with fluids and gatorade, but soon developed a sore throat. Advised that pt monitor sore throat and could be related to vomiting. Pt instructed to take pepcid or prilosec to help coat stomach from stomach acid, that can contribute to sore throat. Pt denies any mouth sores at this time. Pt congestion is mild at this time. Told pt to take claritin to help dry up congestion and drink plenty of fluids. Pt advised to monitor symptoms over the next 24/48 hrs and call if no improvement or if she gets worse.   Pt instructed to check temp 2-3x daily, diligent hang hygiene, staying away from public places, limiting visitors and social distancing with coronavirus risk, especially since pt is 1 week post chemo. Pt verbalized understanding.

## 2019-01-11 ENCOUNTER — Telehealth: Payer: Self-pay | Admitting: *Deleted

## 2019-01-11 NOTE — Telephone Encounter (Signed)
FYI "Debbie Watts [phone]).  Using Claritin since chemotherapy a week ago.  Does Claritin or medication I receive cause nose bleeds?  I see blood on tissue when I blow.  No cold or blowing frequently.  Sometimes it drips out.  Use Equate Alergy relief (Certrizine) for seasonal allergies.  No blood thinners.  Room Tempertaure set at 72."  Conveyed Claritin does not cause nose bleeds.  Keep room temperature warm, use humidifier, nasal saline spray and other means to keep nasal area moist.  If nose dripping blood pinch, lean forward.  If does not stop within 10-minutes report to E.D

## 2019-01-18 ENCOUNTER — Emergency Department (HOSPITAL_COMMUNITY): Payer: Medicare HMO

## 2019-01-18 ENCOUNTER — Encounter (HOSPITAL_COMMUNITY): Payer: Self-pay | Admitting: Emergency Medicine

## 2019-01-18 ENCOUNTER — Inpatient Hospital Stay (HOSPITAL_COMMUNITY)
Admission: EM | Admit: 2019-01-18 | Discharge: 2019-01-20 | DRG: 312 | Disposition: A | Discharge status: Home / Self Care | Payer: Medicare HMO | Attending: Internal Medicine | Admitting: Internal Medicine

## 2019-01-18 ENCOUNTER — Other Ambulatory Visit: Payer: Self-pay

## 2019-01-18 DIAGNOSIS — Z170 Estrogen receptor positive status [ER+]: Secondary | ICD-10-CM

## 2019-01-18 DIAGNOSIS — F17210 Nicotine dependence, cigarettes, uncomplicated: Secondary | ICD-10-CM | POA: Diagnosis present

## 2019-01-18 DIAGNOSIS — G35 Multiple sclerosis: Secondary | ICD-10-CM | POA: Diagnosis present

## 2019-01-18 DIAGNOSIS — R55 Syncope and collapse: Secondary | ICD-10-CM | POA: Diagnosis not present

## 2019-01-18 DIAGNOSIS — I951 Orthostatic hypotension: Secondary | ICD-10-CM | POA: Diagnosis not present

## 2019-01-18 DIAGNOSIS — Z79891 Long term (current) use of opiate analgesic: Secondary | ICD-10-CM | POA: Diagnosis not present

## 2019-01-18 DIAGNOSIS — S0990XA Unspecified injury of head, initial encounter: Secondary | ICD-10-CM | POA: Diagnosis not present

## 2019-01-18 DIAGNOSIS — F339 Major depressive disorder, recurrent, unspecified: Secondary | ICD-10-CM | POA: Diagnosis present

## 2019-01-18 DIAGNOSIS — Z888 Allergy status to other drugs, medicaments and biological substances status: Secondary | ICD-10-CM | POA: Diagnosis not present

## 2019-01-18 DIAGNOSIS — R197 Diarrhea, unspecified: Secondary | ICD-10-CM | POA: Diagnosis present

## 2019-01-18 DIAGNOSIS — R112 Nausea with vomiting, unspecified: Secondary | ICD-10-CM | POA: Diagnosis not present

## 2019-01-18 DIAGNOSIS — I10 Essential (primary) hypertension: Secondary | ICD-10-CM | POA: Diagnosis present

## 2019-01-18 DIAGNOSIS — Z808 Family history of malignant neoplasm of other organs or systems: Secondary | ICD-10-CM

## 2019-01-18 DIAGNOSIS — Z79899 Other long term (current) drug therapy: Secondary | ICD-10-CM

## 2019-01-18 DIAGNOSIS — Z7951 Long term (current) use of inhaled steroids: Secondary | ICD-10-CM | POA: Diagnosis not present

## 2019-01-18 DIAGNOSIS — E876 Hypokalemia: Secondary | ICD-10-CM | POA: Diagnosis present

## 2019-01-18 DIAGNOSIS — C50919 Malignant neoplasm of unspecified site of unspecified female breast: Secondary | ICD-10-CM

## 2019-01-18 DIAGNOSIS — D72829 Elevated white blood cell count, unspecified: Secondary | ICD-10-CM | POA: Diagnosis present

## 2019-01-18 DIAGNOSIS — E871 Hypo-osmolality and hyponatremia: Secondary | ICD-10-CM | POA: Diagnosis present

## 2019-01-18 DIAGNOSIS — C50411 Malignant neoplasm of upper-outer quadrant of right female breast: Secondary | ICD-10-CM

## 2019-01-18 DIAGNOSIS — G35D Multiple sclerosis, unspecified: Secondary | ICD-10-CM | POA: Diagnosis present

## 2019-01-18 LAB — CBC
HCT: 36.9 % (ref 36.0–46.0)
Hemoglobin: 12.1 g/dL (ref 12.0–15.0)
MCH: 31.5 pg (ref 26.0–34.0)
MCHC: 32.8 g/dL (ref 30.0–36.0)
MCV: 96.1 fL (ref 80.0–100.0)
Platelets: 263 10*3/uL (ref 150–400)
RBC: 3.84 MIL/uL — AB (ref 3.87–5.11)
RDW: 16.4 % — ABNORMAL HIGH (ref 11.5–15.5)
WBC: 17.4 10*3/uL — ABNORMAL HIGH (ref 4.0–10.5)
nRBC: 0 % (ref 0.0–0.2)

## 2019-01-18 LAB — CBG MONITORING, ED: Glucose-Capillary: 156 mg/dL — ABNORMAL HIGH (ref 70–99)

## 2019-01-18 LAB — URINALYSIS, ROUTINE W REFLEX MICROSCOPIC
Bacteria, UA: NONE SEEN
Bilirubin Urine: NEGATIVE
GLUCOSE, UA: NEGATIVE mg/dL
Hgb urine dipstick: NEGATIVE
Ketones, ur: 5 mg/dL — AB
Nitrite: NEGATIVE
Protein, ur: 30 mg/dL — AB
Specific Gravity, Urine: 1.015 (ref 1.005–1.030)
pH: 5 (ref 5.0–8.0)

## 2019-01-18 LAB — BASIC METABOLIC PANEL
ANION GAP: 9 (ref 5–15)
BUN: 15 mg/dL (ref 6–20)
CO2: 25 mmol/L (ref 22–32)
Calcium: 8.2 mg/dL — ABNORMAL LOW (ref 8.9–10.3)
Chloride: 100 mmol/L (ref 98–111)
Creatinine, Ser: 0.65 mg/dL (ref 0.44–1.00)
GFR calc Af Amer: >60 mL/min (ref >60)
GFR calc non Af Amer: >60 mL/min (ref >60)
Glucose, Bld: 154 mg/dL — ABNORMAL HIGH (ref 70–99)
Potassium: 3.3 mmol/L — ABNORMAL LOW (ref 3.5–5.1)
Sodium: 134 mmol/L — ABNORMAL LOW (ref 135–145)

## 2019-01-18 LAB — I-STAT BETA HCG BLOOD, ED (MC, WL, AP ONLY): I-stat hCG, quantitative: 7.3 m[IU]/mL — ABNORMAL HIGH (ref <5)

## 2019-01-18 IMAGING — CT CT HEAD WITHOUT CONTRAST
4 series · 16 of 47 positions shown, 18 images · non-contrast
Comparison: Brain MRI [DATE] and earlier.

CLINICAL DATA: 57-year-old female status post syncope with injury
to the back of the head. History of multiple sclerosis, breast
cancer.

EXAM:
CT HEAD WITHOUT CONTRAST
TECHNIQUE: Contiguous axial images were obtained from the base of the skull
through the vertex without intravenous contrast.

[Series 2: head wo · axial · 0.43mm/px · z∈[-120,-15]mm · 7 of 29 slices shown, 9 images]
[im 4/29  brain]
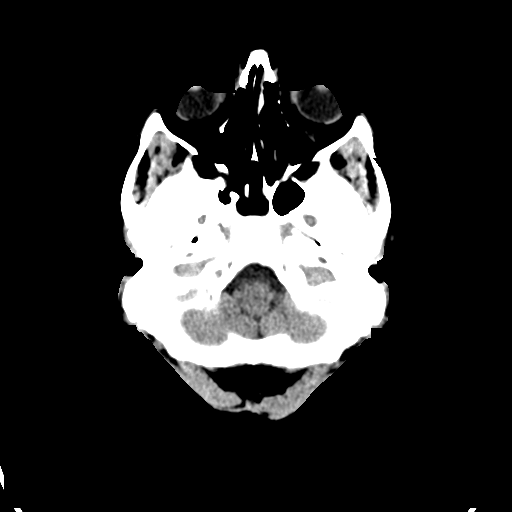
[im 4/29  bone]
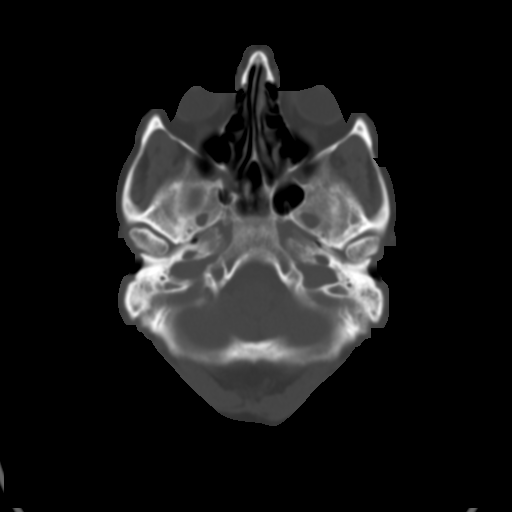
[im 8/29  brain]
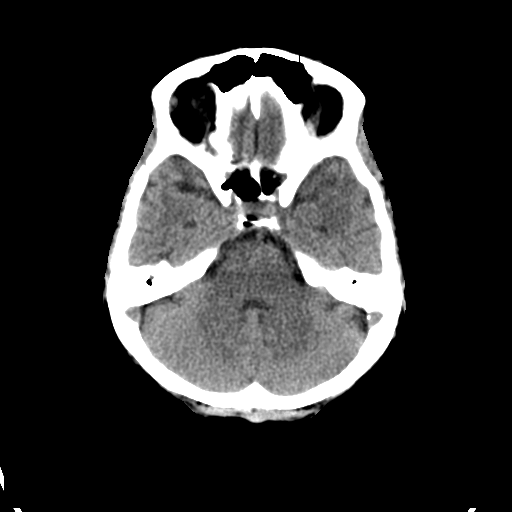
[im 11/29  brain]
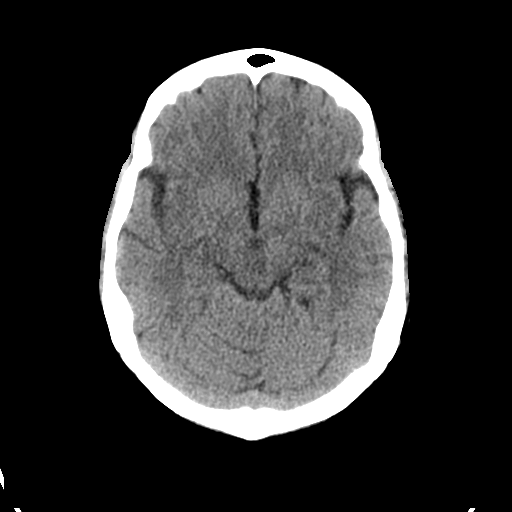
[im 15/29  brain]
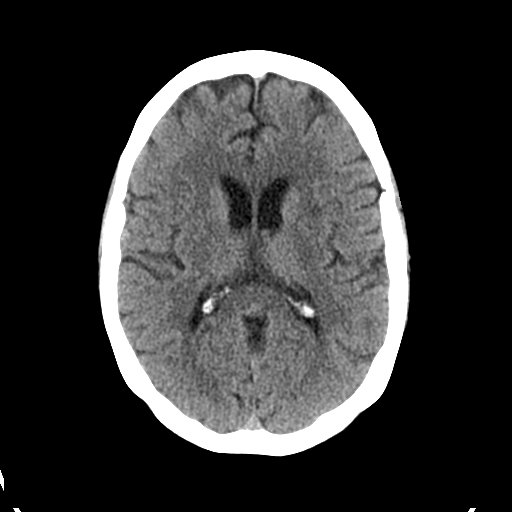
[im 18/29  brain]
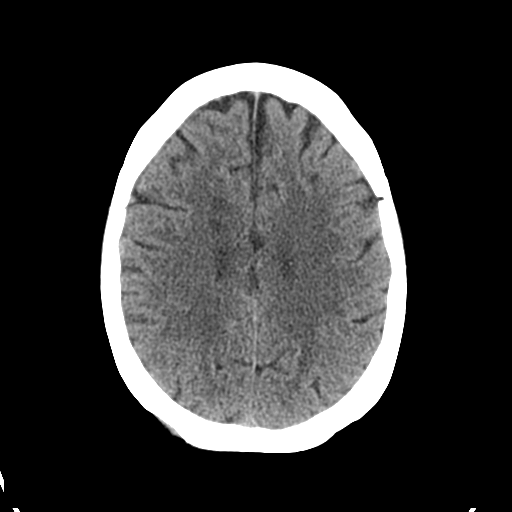
[im 18/29  bone]
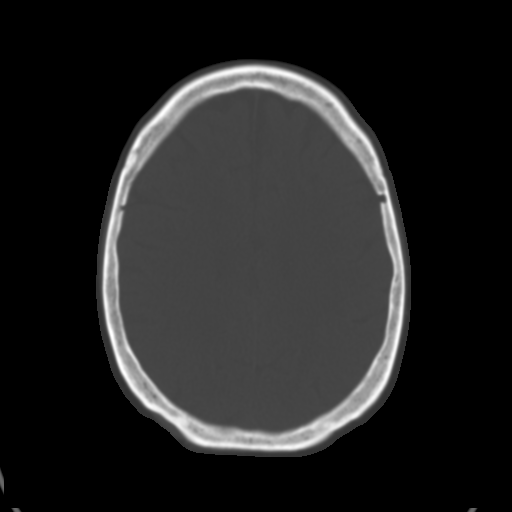
[im 22/29  brain]
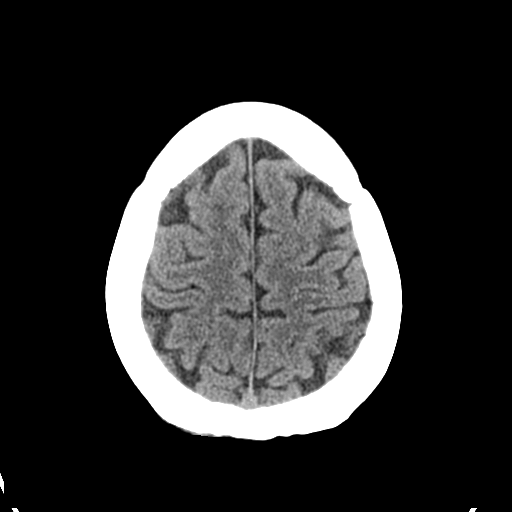
[im 25/29  brain]
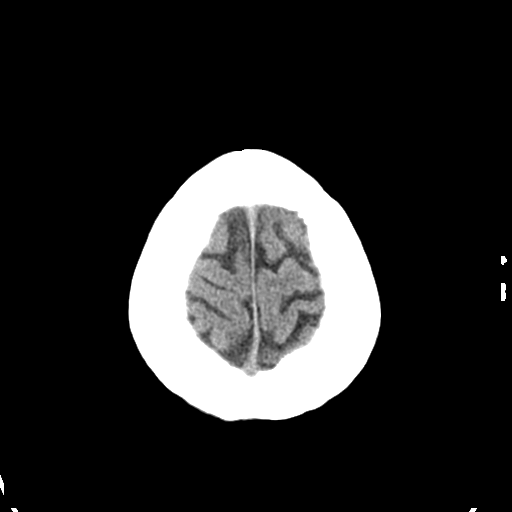

[Series 3: head bone · axial · 0.43mm/px · z∈[-121,-93]mm · 3 of 72 slices shown]
[im 8/72  bone]
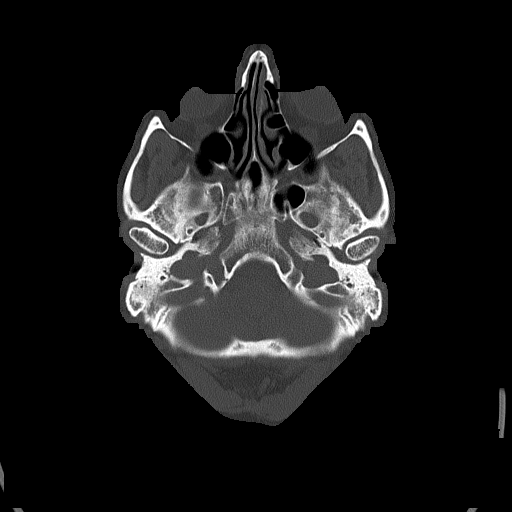
[im 15/72  bone]
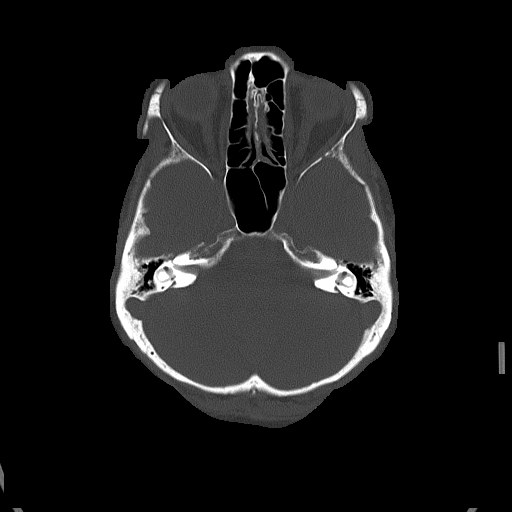
[im 22/72  bone]
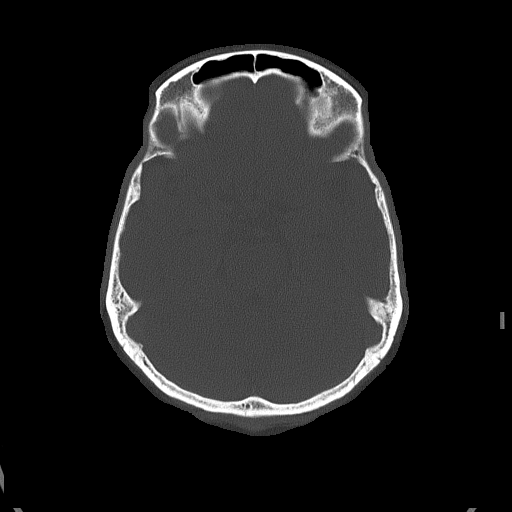

[Series 4: coronal soft tissue · coronal · 0.27mm/px · 3 of 62 slices shown]
[im 21/62  brain]
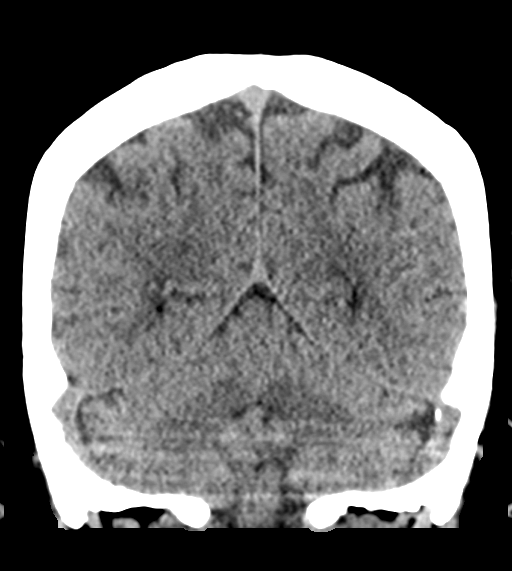
[im 28/62  brain]
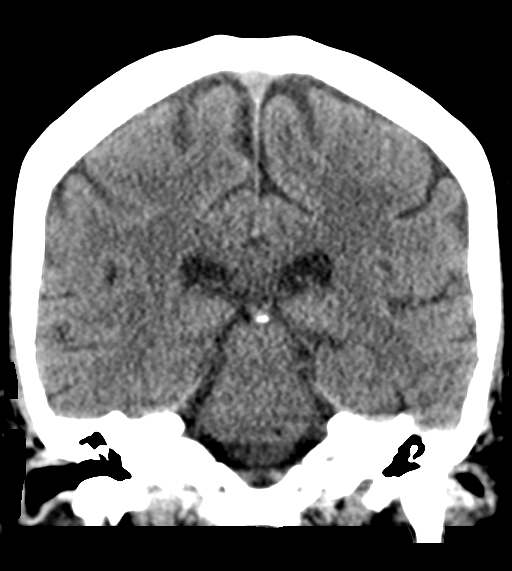
[im 34/62  brain]
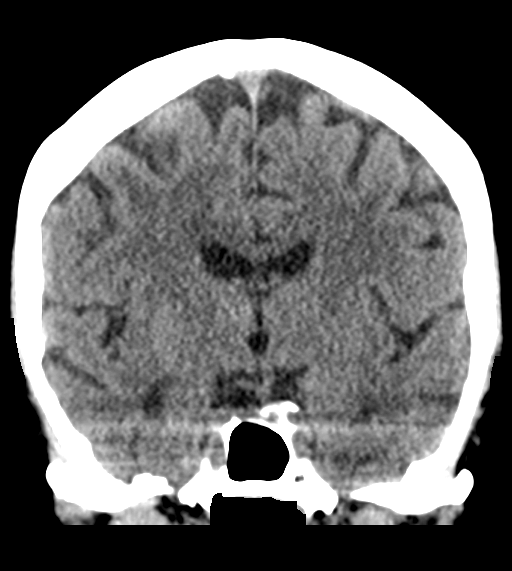

[Series 5: sagittal soft tissue · sagittal · 0.31mm/px · 3 of 48 slices shown]
[im 16/48  brain]
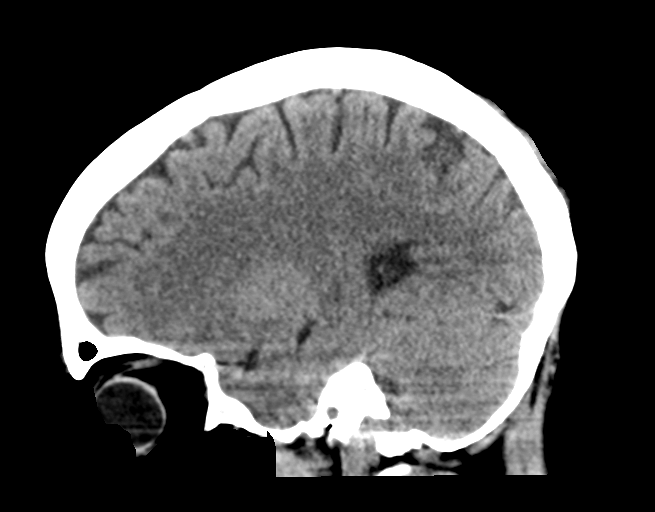
[im 24/48  brain]
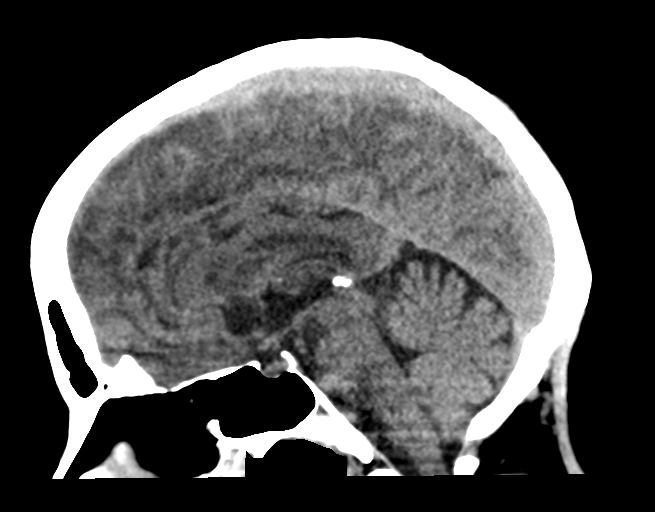
[im 32/48  brain]
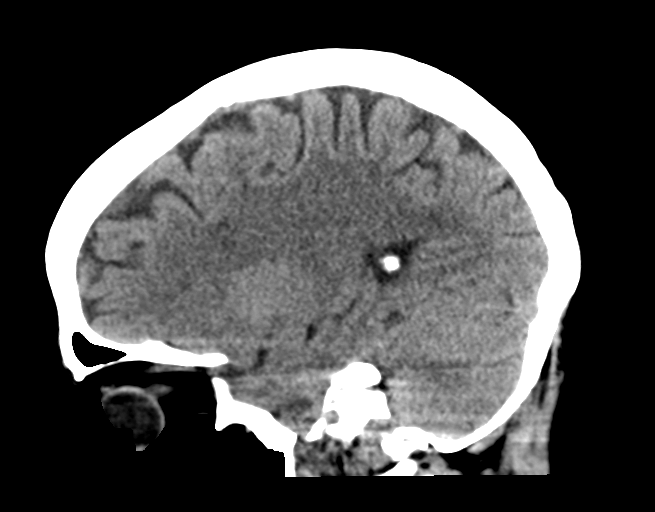

[16 of 47 positions shown; findings below may reference images not displayed]

FINDINGS: Brain: Stable cerebral volume since [O9]. Chronic bilateral white
matter lesions are more apparent on MRI than CT, but no progression
is evident.

No midline shift, ventriculomegaly, mass effect, evidence of mass
lesion, intracranial hemorrhage or evidence of cortically based
acute infarction.

Vascular: No suspicious intracranial vascular hyperdensity.

Skull: Negative.

Sinuses/Orbits: Visible paranasal sinuses remain clear. There are
mild bilateral mastoid effusions which appears stable, greater on
the right.

Other: Right posterior convexity scalp hematoma or contusion on
series 3, image 52. Underlying calvarium intact.

Negative other visible orbit and scalp soft tissues.
IMPRESSION: 1. Posterior scalp soft tissue injury without underlying skull
fracture.
2. No acute intracranial abnormality. Chronic cerebral white matter
disease, grossly stable since [DATE].

## 2019-01-18 IMAGING — CR CHEST - 2 VIEW
2 series · 2 of 2 positions shown · non-contrast
Comparison: None.

CLINICAL DATA: 57-year-old female status post syncope with injury
to the back of the head. History of multiple sclerosis, breast
cancer. Smoker.

EXAM:
CHEST - 2 VIEW

[w chest lat]
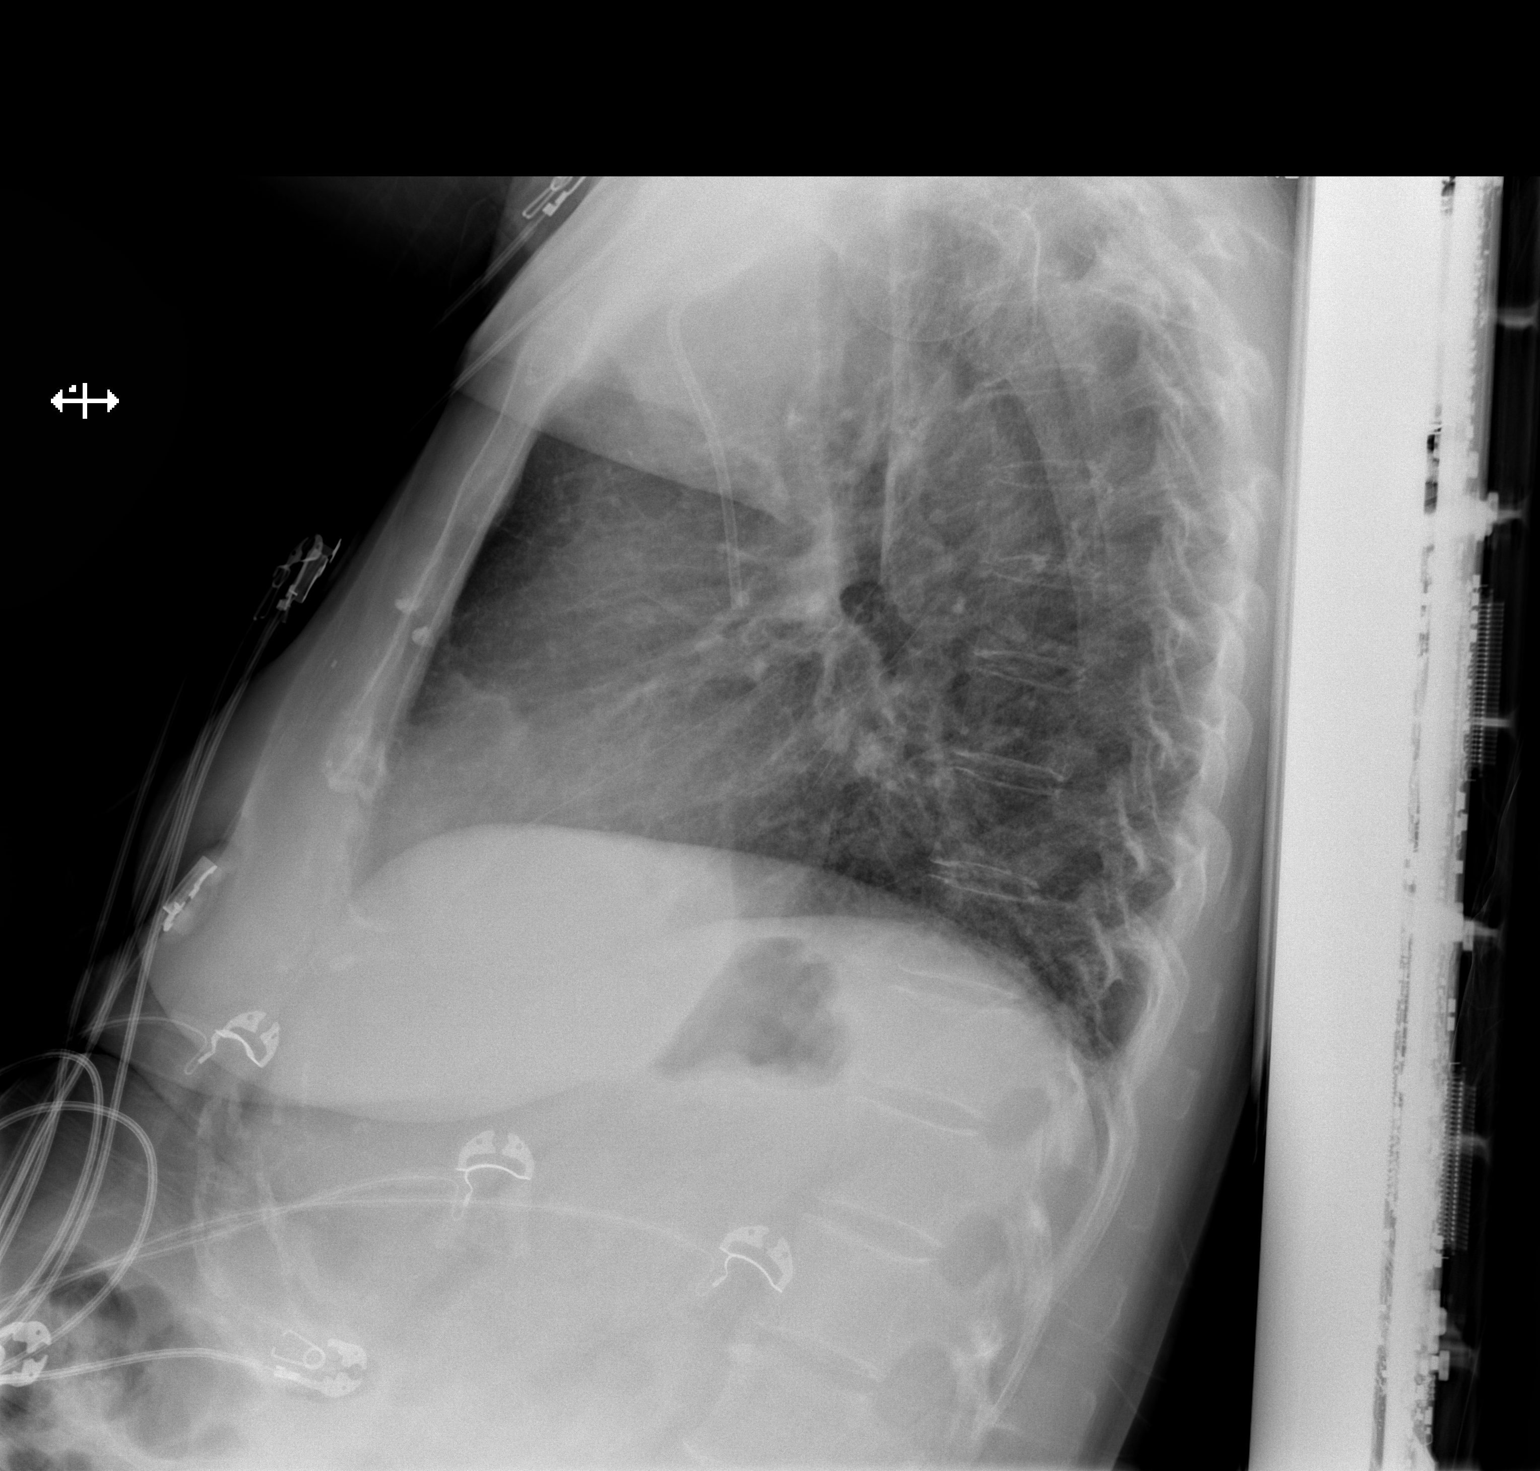

[x chest ap]
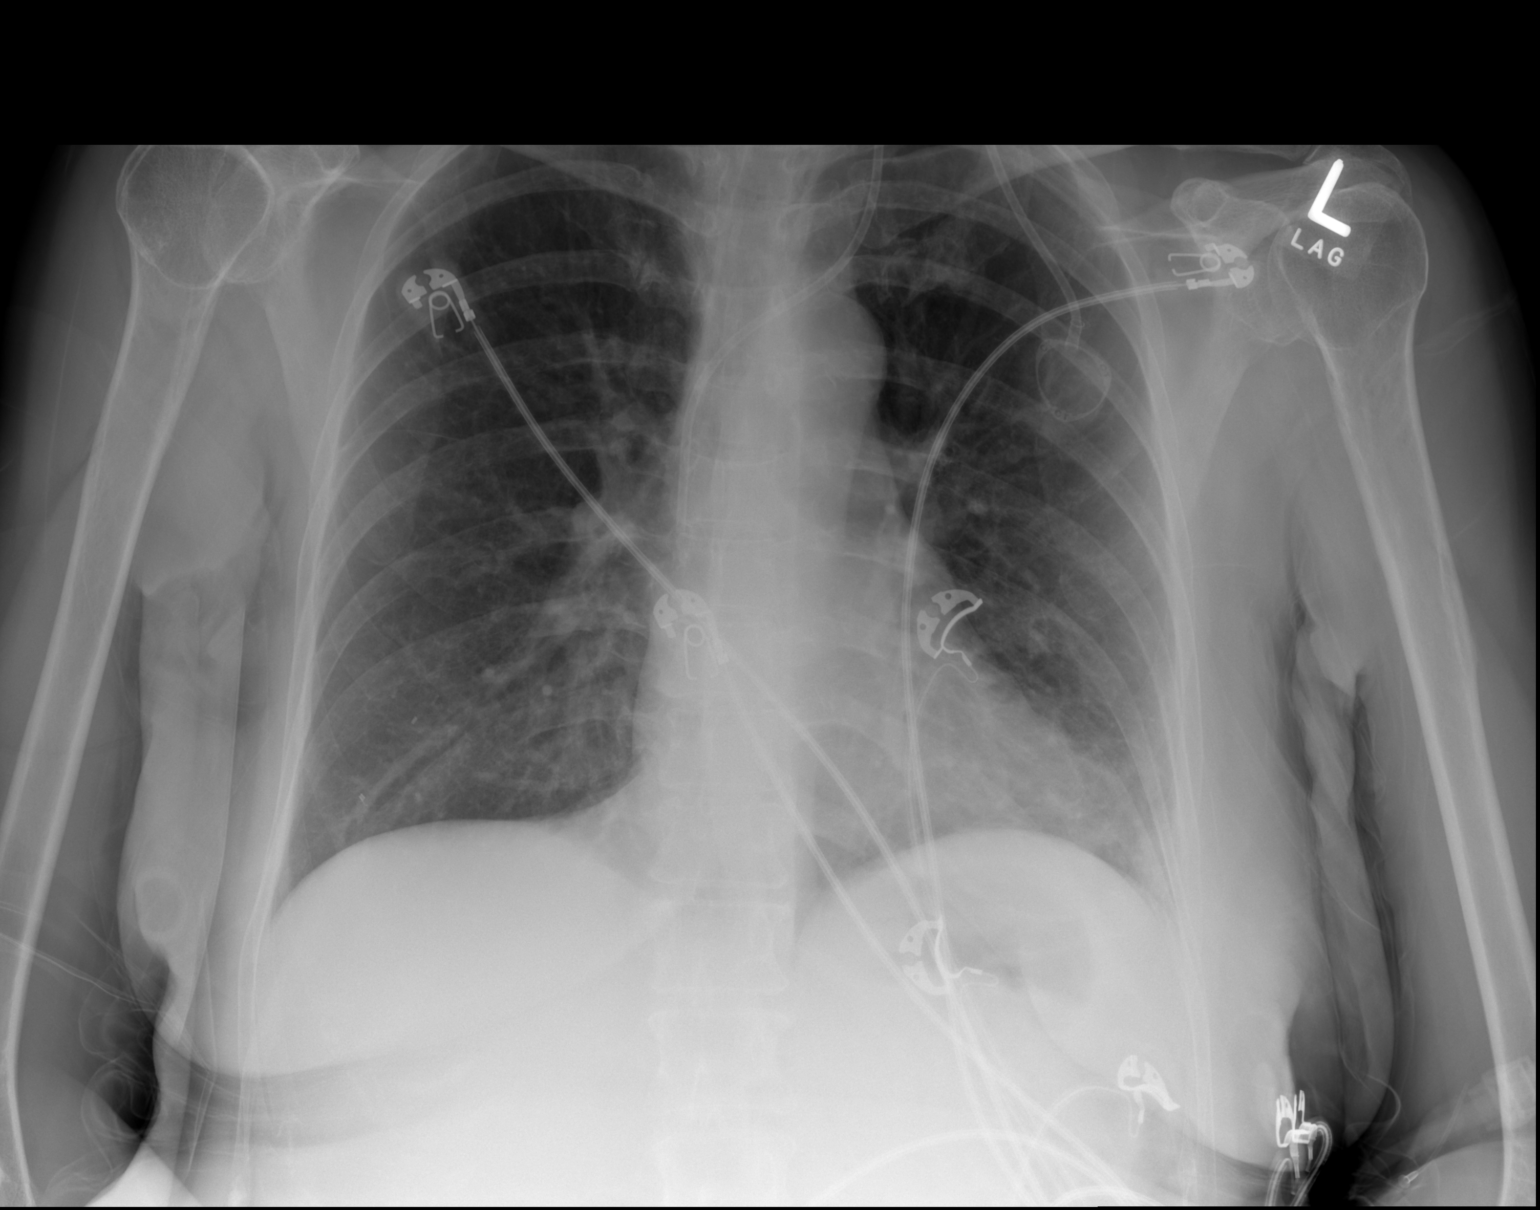

[2 of 2 positions shown; findings below may reference images not displayed]

FINDINGS: Semi upright AP and lateral views of the chest. Left chest IJ
approach power port in place. The entire catheter is not visible.
Normal cardiac size and mediastinal contours. Low normal lung
volumes. Visualized tracheal air column is within normal limits. No
pneumothorax, pulmonary edema, pleural effusion or confluent
pulmonary opacity.

Mild basilar predominant increased pulmonary interstitial markings,
probably smoking related.

No acute osseous abnormality identified. Negative visible bowel gas
pattern.
IMPRESSION: No acute cardiopulmonary abnormality or acute traumatic injury
identified.

## 2019-01-18 MED ORDER — SODIUM CHLORIDE 0.9% FLUSH
3.0000 mL | Freq: Once | INTRAVENOUS | Status: AC
  Administered 2019-01-18: 3 mL via INTRAVENOUS

## 2019-01-18 NOTE — ED Notes (Signed)
Bed: WA08 Expected date:  Expected time:  Means of arrival:  Comments: TRIAGE 2

## 2019-01-18 NOTE — ED Triage Notes (Signed)
Patient reports syncopal episode, hitting posterior head on floor. Reports intermittent N/V/D. Denies chest pain and SOB. Hx breast cancer. Last chemo x2 weeks ago.

## 2019-01-19 ENCOUNTER — Other Ambulatory Visit: Payer: Self-pay

## 2019-01-19 ENCOUNTER — Encounter (HOSPITAL_COMMUNITY): Payer: Self-pay | Admitting: Family Medicine

## 2019-01-19 DIAGNOSIS — F339 Major depressive disorder, recurrent, unspecified: Secondary | ICD-10-CM | POA: Diagnosis present

## 2019-01-19 DIAGNOSIS — C50411 Malignant neoplasm of upper-outer quadrant of right female breast: Secondary | ICD-10-CM

## 2019-01-19 DIAGNOSIS — G35 Multiple sclerosis: Secondary | ICD-10-CM | POA: Diagnosis present

## 2019-01-19 DIAGNOSIS — Z79891 Long term (current) use of opiate analgesic: Secondary | ICD-10-CM | POA: Diagnosis not present

## 2019-01-19 DIAGNOSIS — R55 Syncope and collapse: Secondary | ICD-10-CM | POA: Diagnosis present

## 2019-01-19 DIAGNOSIS — D72829 Elevated white blood cell count, unspecified: Secondary | ICD-10-CM | POA: Diagnosis present

## 2019-01-19 DIAGNOSIS — I10 Essential (primary) hypertension: Secondary | ICD-10-CM | POA: Diagnosis present

## 2019-01-19 DIAGNOSIS — R197 Diarrhea, unspecified: Secondary | ICD-10-CM | POA: Diagnosis present

## 2019-01-19 DIAGNOSIS — E876 Hypokalemia: Secondary | ICD-10-CM | POA: Diagnosis present

## 2019-01-19 DIAGNOSIS — Z7951 Long term (current) use of inhaled steroids: Secondary | ICD-10-CM | POA: Diagnosis not present

## 2019-01-19 DIAGNOSIS — I951 Orthostatic hypotension: Secondary | ICD-10-CM | POA: Diagnosis present

## 2019-01-19 DIAGNOSIS — R112 Nausea with vomiting, unspecified: Secondary | ICD-10-CM | POA: Diagnosis present

## 2019-01-19 DIAGNOSIS — Z170 Estrogen receptor positive status [ER+]: Secondary | ICD-10-CM | POA: Diagnosis not present

## 2019-01-19 DIAGNOSIS — Z888 Allergy status to other drugs, medicaments and biological substances status: Secondary | ICD-10-CM | POA: Diagnosis not present

## 2019-01-19 DIAGNOSIS — Z79899 Other long term (current) drug therapy: Secondary | ICD-10-CM | POA: Diagnosis not present

## 2019-01-19 DIAGNOSIS — Z808 Family history of malignant neoplasm of other organs or systems: Secondary | ICD-10-CM | POA: Diagnosis not present

## 2019-01-19 DIAGNOSIS — F17210 Nicotine dependence, cigarettes, uncomplicated: Secondary | ICD-10-CM | POA: Diagnosis present

## 2019-01-19 DIAGNOSIS — E871 Hypo-osmolality and hyponatremia: Secondary | ICD-10-CM | POA: Diagnosis present

## 2019-01-19 HISTORY — DX: Syncope and collapse: R55

## 2019-01-19 HISTORY — DX: Elevated white blood cell count, unspecified: D72.829

## 2019-01-19 LAB — CBC WITH DIFFERENTIAL/PLATELET
ABS IMMATURE GRANULOCYTES: 0.09 10*3/uL — AB (ref 0.00–0.07)
Basophils Absolute: 0.1 10*3/uL (ref 0.0–0.1)
Basophils Relative: 1 %
Eosinophils Absolute: 0 10*3/uL (ref 0.0–0.5)
Eosinophils Relative: 0 %
HCT: 34.1 % — ABNORMAL LOW (ref 36.0–46.0)
Hemoglobin: 10.8 g/dL — ABNORMAL LOW (ref 12.0–15.0)
Immature Granulocytes: 1 %
Lymphocytes Relative: 10 %
Lymphs Abs: 1.1 10*3/uL (ref 0.7–4.0)
MCH: 30.6 pg (ref 26.0–34.0)
MCHC: 31.7 g/dL (ref 30.0–36.0)
MCV: 96.6 fL (ref 80.0–100.0)
MONO ABS: 0.7 10*3/uL (ref 0.1–1.0)
Monocytes Relative: 7 %
NEUTROS ABS: 8.8 10*3/uL — AB (ref 1.7–7.7)
NEUTROS PCT: 81 %
PLATELETS: 256 10*3/uL (ref 150–400)
RBC: 3.53 MIL/uL — ABNORMAL LOW (ref 3.87–5.11)
RDW: 16.4 % — ABNORMAL HIGH (ref 11.5–15.5)
WBC: 10.8 10*3/uL — ABNORMAL HIGH (ref 4.0–10.5)
nRBC: 0 % (ref 0.0–0.2)

## 2019-01-19 LAB — I-STAT TROPONIN, ED: TROPONIN I, POC: 0.02 ng/mL (ref 0.00–0.08)

## 2019-01-19 LAB — BASIC METABOLIC PANEL
ANION GAP: 7 (ref 5–15)
BUN: 13 mg/dL (ref 6–20)
CO2: 25 mmol/L (ref 22–32)
Calcium: 8 mg/dL — ABNORMAL LOW (ref 8.9–10.3)
Chloride: 108 mmol/L (ref 98–111)
Creatinine, Ser: 0.63 mg/dL (ref 0.44–1.00)
GFR calc Af Amer: >60 mL/min (ref >60)
GFR calc non Af Amer: >60 mL/min (ref >60)
Glucose, Bld: 121 mg/dL — ABNORMAL HIGH (ref 70–99)
Potassium: 3.8 mmol/L (ref 3.5–5.1)
Sodium: 140 mmol/L (ref 135–145)

## 2019-01-19 LAB — GLUCOSE, CAPILLARY: Glucose-Capillary: 103 mg/dL — ABNORMAL HIGH (ref 70–99)

## 2019-01-19 LAB — TROPONIN I: Troponin I: <0.03 ng/mL (ref <0.03)

## 2019-01-19 LAB — MAGNESIUM: Magnesium: 2.1 mg/dL (ref 1.7–2.4)

## 2019-01-19 MED ORDER — POTASSIUM CHLORIDE CRYS ER 20 MEQ PO TBCR
20.0000 meq | EXTENDED_RELEASE_TABLET | Freq: Once | ORAL | Status: AC
  Administered 2019-01-19: 20 meq via ORAL
  Filled 2019-01-19: qty 1

## 2019-01-19 MED ORDER — FLUTICASONE PROPIONATE 50 MCG/ACT NA SUSP
2.0000 | Freq: Every day | NASAL | Status: DC
  Administered 2019-01-19: 2 via NASAL
  Filled 2019-01-19: qty 16

## 2019-01-19 MED ORDER — ACETAMINOPHEN 325 MG PO TABS
650.0000 mg | ORAL_TABLET | Freq: Four times a day (QID) | ORAL | Status: DC | PRN
  Administered 2019-01-19: 650 mg via ORAL
  Filled 2019-01-19: qty 2

## 2019-01-19 MED ORDER — SODIUM CHLORIDE 0.9 % IV SOLN
1.0000 g | Freq: Once | INTRAVENOUS | Status: AC
  Administered 2019-01-19: 1 g via INTRAVENOUS
  Filled 2019-01-19: qty 10

## 2019-01-19 MED ORDER — TRAMADOL HCL 50 MG PO TABS
50.0000 mg | ORAL_TABLET | Freq: Four times a day (QID) | ORAL | Status: DC | PRN
  Administered 2019-01-19: 50 mg via ORAL
  Filled 2019-01-19: qty 1

## 2019-01-19 MED ORDER — ONDANSETRON HCL 4 MG/2ML IJ SOLN
4.0000 mg | Freq: Four times a day (QID) | INTRAMUSCULAR | Status: DC | PRN
  Administered 2019-01-19 – 2019-01-20 (×4): 4 mg via INTRAVENOUS
  Filled 2019-01-19 (×4): qty 2

## 2019-01-19 MED ORDER — PAROXETINE HCL 20 MG PO TABS
20.0000 mg | ORAL_TABLET | Freq: Every day | ORAL | Status: DC
  Administered 2019-01-19 – 2019-01-20 (×2): 20 mg via ORAL
  Filled 2019-01-19 (×2): qty 1

## 2019-01-19 MED ORDER — ACETAMINOPHEN 650 MG RE SUPP: 650.0000 mg | Freq: Four times a day (QID) | RECTAL | Status: DC | PRN

## 2019-01-19 MED ORDER — POTASSIUM CHLORIDE IN NACL 20-0.9 MEQ/L-% IV SOLN
INTRAVENOUS | Status: AC
  Administered 2019-01-19: 03:00:00 via INTRAVENOUS
  Filled 2019-01-19: qty 1000

## 2019-01-19 MED ORDER — CARBIDOPA-LEVODOPA 10-100 MG PO TABS
1.0000 | ORAL_TABLET | Freq: Every day | ORAL | Status: DC
  Administered 2019-01-19: 1 via ORAL
  Filled 2019-01-19: qty 1

## 2019-01-19 MED ORDER — GABAPENTIN 300 MG PO CAPS
600.0000 mg | ORAL_CAPSULE | Freq: Two times a day (BID) | ORAL | Status: DC
  Administered 2019-01-19 – 2019-01-20 (×3): 600 mg via ORAL
  Filled 2019-01-19 (×3): qty 2

## 2019-01-19 MED ORDER — ENOXAPARIN SODIUM 40 MG/0.4ML ~~LOC~~ SOLN
40.0000 mg | SUBCUTANEOUS | Status: DC
  Administered 2019-01-19 – 2019-01-20 (×2): 40 mg via SUBCUTANEOUS
  Filled 2019-01-19 (×2): qty 0.4

## 2019-01-19 MED ORDER — SODIUM CHLORIDE 0.9% FLUSH
3.0000 mL | Freq: Two times a day (BID) | INTRAVENOUS | Status: DC
  Administered 2019-01-19 – 2019-01-20 (×3): 3 mL via INTRAVENOUS

## 2019-01-19 MED ORDER — SODIUM CHLORIDE 0.9 % IV BOLUS
1000.0000 mL | Freq: Once | INTRAVENOUS | Status: AC
  Administered 2019-01-19: 1000 mL via INTRAVENOUS

## 2019-01-19 MED ORDER — POTASSIUM CHLORIDE IN NACL 20-0.9 MEQ/L-% IV SOLN
INTRAVENOUS | Status: AC
  Administered 2019-01-19: 12:00:00 via INTRAVENOUS
  Filled 2019-01-19 (×2): qty 1000

## 2019-01-19 MED ORDER — ALBUTEROL SULFATE (2.5 MG/3ML) 0.083% IN NEBU: 3.0000 mL | INHALATION_SOLUTION | Freq: Four times a day (QID) | RESPIRATORY_TRACT | Status: DC | PRN

## 2019-01-19 MED ORDER — PROCHLORPERAZINE MALEATE 10 MG PO TABS: 10.0000 mg | ORAL_TABLET | Freq: Four times a day (QID) | ORAL | Status: DC | PRN

## 2019-01-19 MED ORDER — CEFTRIAXONE SODIUM 1 G IJ SOLR: 1.0000 g | Freq: Once | INTRAMUSCULAR | Status: DC

## 2019-01-19 MED ORDER — OXYCODONE HCL 5 MG PO TABS
5.0000 mg | ORAL_TABLET | ORAL | Status: DC | PRN
  Administered 2019-01-19: 5 mg via ORAL
  Filled 2019-01-19 (×2): qty 1

## 2019-01-19 NOTE — Progress Notes (Signed)
PROGRESS NOTE  Debbie Watts [id number]:[id number] DOB: Jun 25, 1961 DOA: 01/18/2019 PCP: Neva Seat, MD  Brief History    Debbie Watts is a 58 y.o. female with medical history significant for hypertension, migraines, multiple sclerosis, and cancer of the right breast undergoing neoadjuvant chemotherapy, now presenting to the emergency department after a syncopal episode.  Patient reports that she has been experiencing nausea and intermittent diarrhea since starting chemotherapy last month, was doing well yesterday with no vomiting or diarrhea, had a couple loose stools this morning, was on her way to the bathroom with urge to defecate again when she became acutely lightheaded and experienced a transient loss of consciousness.  This resulted in a fall and she hit the back of her head on the ground.  She vomited once following the loss of consciousness, had another episode of diarrhea, took some Imodium, and eventually came to the ED for evaluation.  She denies any fevers or chills, denies abdominal pain, and has not had any diarrhea since taking Imodium.  She denies any chest pain or palpitations.  The back of her head hurts where she hit the ground, but there is no change in vision or hearing or focal numbness or weakness.  She denies dysuria or flank pain.  ED Course: Upon arrival to the ED, patient is found to be afebrile, saturating well on room air, and with blood pressure 81/52.  EKG features a sinus rhythm with nonspecific ST-T abnormality.  Chest x-ray is negative for acute cardiopulmonary disease.  Noncontrast head CT is notable for posterior scalp soft tissue injury without underlying fracture or acute intracranial abnormality.  Chemistry panel is notable for a slight hyponatremia and a mild hypokalemia.  CBC features a leukocytosis to 17,400.  Patient was given a liter of normal saline, urine was sent for culture, and she was given a gram of IV Rocephin in the ED. She will be observed for  ongoing evaluation and management.   Consultants   None  Procedures   None  Antibiotics   1 gm Rocephin IV x 1.  Interval History/Subjective  The patient has been admitted to a telemetry bed. She is receiving maintenance IV fluids. The patient states that she is still nauseated, but that she has had no further diarrhea since admission.  The patient is standing at bedside having orthostatic vitals taken. She states that she feels tired, but not weak or dizzy.  Objective   Vitals:  Vitals:   01/19/19 0907 01/19/19 0910  BP: 117/80 116/83  Pulse: 100 (!) 102  Resp:    Temp:    SpO2: 96% 96%    Exam:  Constitutional:   The patient is awake, alert, and oriented x 3. No acute distress. Respiratory:   No wheezes, rales, or rhonchi.  No tactile fremitus.       No increased work of breathing. Cardiovascular:   Regular rate and rhythm  No murmur, ectopy, or gallup  No lateral PMI. No thrills.  No LE extremity edema    Normal pedal pulses Abdomen:   Abdomen is soft, non-tender, non-distended  No hernias, masses, or organomegaly are appreciated.  Normoactive bowel sounds. Musculoskeletal:   No cyanosis, clubbing, or edema Skin:   No rashes, lesions, ulcers  palpation of skin: no induration or nodules Neurologic:   CN 2-12 intact  Sensation all 4 extremities intact Psychiatric:   Mental status o Mood, affect appropriate o Orientation to person, place, time   judgment and insight appear intact  I have personally reviewed the following:   Today's Data   Orthostatic vitals: The patient continued to be orthostatic  Lab Data   BMP, CBC, Urinalysis  Micro Data   Urine culture  Imaging   CXR  Scheduled Meds:  carbidopa-levodopa  1 tablet Oral QHS   enoxaparin (LOVENOX) injection  40 mg Subcutaneous Q24H   fluticasone  2 spray Each Nare QHS   gabapentin  600 mg Oral BID   PARoxetine  20 mg Oral Daily   sodium chloride flush   3 mL Intravenous Q12H   Continuous Infusions:  0.9 % NaCl with KCl 20 mEq / L      Principal Problem:   Syncope Active Problems:   Multiple sclerosis (HCC)   Major depression, recurrent, chronic (HCC)   Essential hypertension   Malignant neoplasm of upper-outer quadrant of right breast in female, estrogen receptor positive (HCC)   Nausea vomiting and diarrhea   Leukocytosis  A & P  Syncope: Likely due to volume depletion secondary to GI losses. The patient has had no diarrhea or vomiting since admission. Echocardiogram performed on 12/06/2018 was unremarkable in terms of risk of cardiac syncope.  Orthostatic hypotension: Monitor with orthostatic vitals. Continue IV fluids.  Nausea vomiting and diarrhea: Pt continues to have nausea, but denies diarrhea since arrival at hospital. She is receiving antiemetics.  Malignant neoplasm of upper-outer quadrant of the right breast, ER+: Noted. Pt is receiving neoadjuvant chemotherapy. She will follow up with oncology as outpatient.  Multiple Sclerosis: Appears at baseline. Continue home meds.  Essential hypertension: Lisinopril is held due to the patient's Orthostatic hypotension.  Leukocytosis: Resolved.  I have seen and examined this patient myself. I have spent 35 minutes in her evaluation and care.  DVT prophylaxis: Lovenox Code Status: Full Code Family Communication: none available Disposition Plan: home   Dirck Butch, DO [facility] Direct contact: see [email or web address]  7PM-7AM contact night coverage as above 01/19/2019, 10:36 AM  LOS: 0 days     LOS: 0 days

## 2019-01-19 NOTE — H&P (Addendum)
History and Physical    Debbie Watts KTG:[id number] DOB: 05-21-1961 DOA: 01/18/2019  PCP: Neva Seat, MD   Patient coming from: Home   Chief Complaint: Passed out, lightheaded, N/V/D   HPI: Debbie Watts is a 58 y.o. female with medical history significant for hypertension, migraines, multiple sclerosis, and cancer of the right breast undergoing neoadjuvant chemotherapy, now presenting to the emergency department after a syncopal episode.  Patient reports that she has been experiencing nausea and intermittent diarrhea since starting chemotherapy last month, was doing well yesterday with no vomiting or diarrhea, had a couple loose stools this morning, was on her way to the bathroom with urge to defecate again when she became acutely lightheaded and experienced a transient loss of consciousness.  This resulted in a fall and she hit the back of her head on the ground.  She vomited once following the loss of consciousness, had another episode of diarrhea, took some Imodium, and eventually came to the ED for evaluation.  She denies any fevers or chills, denies abdominal pain, and has not had any diarrhea since taking Imodium.  She denies any chest pain or palpitations.  The back of her head hurts where she hit the ground, but there is no change in vision or hearing or focal numbness or weakness.  She denies dysuria or flank pain.  ED Course: Upon arrival to the ED, patient is found to be afebrile, saturating well on room air, and with blood pressure 81/52.  EKG features a sinus rhythm with nonspecific ST-T abnormality.  Chest x-ray is negative for acute cardiopulmonary disease.  Noncontrast head CT is notable for posterior scalp soft tissue injury without underlying fracture or acute intracranial abnormality.  Chemistry panel is notable for a slight hyponatremia and a mild hypokalemia.  CBC features a leukocytosis to 17,400.  Patient was given a liter of normal saline, urine was sent for  culture, and she was given a gram of IV Rocephin in the ED. She will be observed for ongoing evaluation and management.   Review of Systems:  All other systems reviewed and apart from HPI, are negative.  Past Medical History:  Diagnosis Date   Depression    Deviated nasal septum    Hearing loss 06/11/2014   Bilateral   Herniated cervical disc    Hypertension    Migraine    Miscarriage    2   MS (multiple sclerosis) (HCC)     Past Surgical History:  Procedure Laterality Date   NASAL SEPTUM SURGERY     spinal injections       reports that she has been smoking cigarettes. She has a 22.00 pack-year smoking history. She has never used smokeless tobacco. She reports current alcohol use. She reports current drug use. Drug: Marijuana.  Allergies  Allergen Reactions   Procaine Shortness Of Breath    Family History  Adopted: Yes  Problem Relation Age of Onset   Cancer Maternal Uncle        throat     Prior to Admission medications   Medication Sig Start Date End Date Taking? Authorizing Provider  Acetaminophen-Caffeine (TENSION HEADACHE RELIEF PO) Take by mouth.    [provider]  betamethasone valerate lotion (VALISONE) 0.1 % APPLY 1 APPLICATION TOPICALLY 2 TIMES DAILY. 08/20/16   [provider]  carbidopa-levodopa (SINEMET IR) 10-100 MG tablet TAKE 1 TABLET BY MOUTH AT BEDTIME. 09/25/18   Sater, Nanine Means, MD  cyclobenzaprine (FLEXERIL) 10 MG tablet TAKE 1 TABLET BY  MOUTH EVERYDAY AT BEDTIME 11/08/18   Neva Seat, MD  dexamethasone (DECADRON) 4 MG tablet Take 1 tablet (4 mg total) by mouth daily. Take 1 tablet day before chemo and 1 tablet day after chemo with food 11/29/18   Nicholas Lose, MD  diclofenac (VOLTAREN) 75 MG EC tablet TAKE 1 TABLET BY MOUTH TWICE A DAY 12/06/18   Neva Seat, MD  Dimethyl Fumarate 240 MG CPDR Take 1 capsule (240 mg total) by mouth 2 (two) times daily. 11/06/18   Sater, Nanine Means, MD  eletriptan (RELPAX) 40  MG tablet Take 40 mg by mouth as needed for migraine or headache. One tablet by mouth at onset of headache. May repeat in 2 hours if headache persists or recurs.    [provider]  ferrous sulfate 325 (65 FE) MG tablet Take 325 mg by mouth daily.    [provider]  fluconazole (DIFLUCAN) 100 MG tablet Take 1 tablet (100 mg total) by mouth daily. 12/19/18   Nicholas Lose, MD  gabapentin (NEURONTIN) 300 MG capsule TAKE 2 CAPSULES (600 MG TOTAL) BY MOUTH 2 (TWO) TIMES DAILY. 11/20/18   Sater, Nanine Means, MD  lidocaine-prilocaine (EMLA) cream Apply to affected area once 11/29/18   Nicholas Lose, MD  lisinopril (PRINIVIL,ZESTRIL) 5 MG tablet TAKE 1 TABLET BY MOUTH EVERY DAY 06/09/18   Neva Seat, MD  LORazepam (ATIVAN) 0.5 MG tablet Take 1 tablet (0.5 mg total) by mouth at bedtime as needed (Nausea or vomiting). 11/29/18   Nicholas Lose, MD  ondansetron (ZOFRAN) 8 MG tablet Take 1 tablet (8 mg total) by mouth 2 (two) times daily as needed (Nausea or vomiting). Begin 4 days after chemotherapy. 11/29/18   Nicholas Lose, MD  oxyCODONE-acetaminophen (PERCOCET/ROXICET) 5-325 MG tablet Take 1 tablet by mouth every 8 (eight) hours as needed for severe pain. 12/21/18   Nicholas Lose, MD  PARoxetine (PAXIL) 20 MG tablet Take 1 tablet (20 mg total) by mouth daily. 06/14/18   Neva Seat, MD  prochlorperazine (COMPAZINE) 10 MG tablet Take 1 tablet (10 mg total) by mouth every 6 (six) hours as needed (Nausea or vomiting). 11/29/18   Nicholas Lose, MD  Tiotropium Bromide Monohydrate 1.25 MCG/ACT AERS Inhale into the lungs. 07/26/16   [provider]  traMADol (ULTRAM) 50 MG tablet TAKE 1 TABLET BY MOUTH TWICE A DAY AS NEEDED 12/11/18   Sater, Nanine Means, MD  VENTOLIN HFA 108 (90 Base) MCG/ACT inhaler Inhale 1-2 puffs into the lungs every 6 (six) hours as needed for wheezing or shortness of breath. 11/27/18   Neva Seat, MD    Physical Exam: Vitals:   01/18/19 2111 01/19/19 0019  BP:  (!) 93/57 (!) 81/52  Pulse: 98 97  Resp: 18 18  Temp: 98.5 F (36.9 C)   TempSrc: Oral   SpO2: 95% 95%  Weight: 64 kg   Height: 5\' 2"  (1.575 m)     Constitutional: NAD, calm  Eyes: PERTLA, lids and conjunctivae normal ENMT: Mucous membranes are moist. Posterior pharynx clear of any exudate or lesions.   Neck: normal, supple, no masses, no thyromegaly Respiratory: clear to auscultation bilaterally, no wheezing, no crackles. Normal respiratory effort.   Cardiovascular: S1 & S2 heard, regular rate and rhythm. No extremity edema.   Abdomen: No distension, no tenderness, soft. Bowel sounds actove.  Musculoskeletal: no clubbing / cyanosis. No joint deformity upper and lower extremities.   Skin: no significant rashes, lesions, ulcers. Warm, dry, well-perfused. Neurologic: no gross facial asymmetry. Sensation intact. Moving  all extremities.  Psychiatric:  Alert and oriented to person, place, and situation. Calm, cooperative.    Labs on Admission: I have personally reviewed following labs and imaging studies  CBC: Recent Labs  Lab 01/18/19 2136  WBC 17.4*  HGB 12.1  HCT 36.9  MCV 96.1  PLT 097   Basic Metabolic Panel: Recent Labs  Lab 01/18/19 2136  NA 134*  K 3.3*  CL 100  CO2 25  GLUCOSE 154*  BUN 15  CREATININE 0.65  CALCIUM 8.2*   GFR: Estimated Creatinine Clearance: 69 mL/min (by C-G formula based on SCr of 0.65 mg/dL). Liver Function Tests: No results for input(s): AST, ALT, ALKPHOS, BILITOT, PROT, ALBUMIN in the last 168 hours. No results for input(s): LIPASE, AMYLASE in the last 168 hours. No results for input(s): AMMONIA in the last 168 hours. Coagulation Profile: No results for input(s): INR, PROTIME in the last 168 hours. Cardiac Enzymes: No results for input(s): CKTOTAL, CKMB, CKMBINDEX, TROPONINI in the last 168 hours. BNP (last 3 results) No results for input(s): PROBNP in the last 8760 hours. HbA1C: No results for input(s): HGBA1C in the last 72  hours. CBG: Recent Labs  Lab 01/18/19 2135  GLUCAP 156*   Lipid Profile: No results for input(s): CHOL, HDL, LDLCALC, TRIG, CHOLHDL, LDLDIRECT in the last 72 hours. Thyroid Function Tests: No results for input(s): TSH, T4TOTAL, FREET4, T3FREE, THYROIDAB in the last 72 hours. Anemia Panel: No results for input(s): VITAMINB12, FOLATE, FERRITIN, TIBC, IRON, RETICCTPCT in the last 72 hours. Urine analysis:    Component Value Date/Time   COLORURINE AMBER (A) 01/18/2019 2203   APPEARANCEUR HAZY (A) 01/18/2019 2203   LABSPEC 1.015 01/18/2019 2203   PHURINE 5.0 01/18/2019 2203   GLUCOSEU NEGATIVE 01/18/2019 2203   HGBUR NEGATIVE 01/18/2019 2203   BILIRUBINUR NEGATIVE 01/18/2019 2203   BILIRUBINUR Negative 08/10/2018 1340   KETONESUR 5 (A) 01/18/2019 2203   PROTEINUR 30 (A) 01/18/2019 2203   UROBILINOGEN 0.2 08/10/2018 1340   NITRITE NEGATIVE 01/18/2019 2203   LEUKOCYTESUR TRACE (A) 01/18/2019 2203   Sepsis Labs: @LABRCNTIP (procalcitonin:4,lacticidven:4) )No results found for this or any previous visit (from the past 240 hour(s)).   Radiological Exams on Admission: Dg Chest 2 View  Result Date: 01/18/2019 CLINICAL DATA:  58 year old female status post syncope with injury to the back of the head. History of multiple sclerosis, breast cancer. Smoker. EXAM: CHEST - 2 VIEW COMPARISON:  None. FINDINGS: Semi upright AP and lateral views of the chest. Left chest IJ approach power port in place. The entire catheter is not visible. Normal cardiac size and mediastinal contours. Low normal lung volumes. Visualized tracheal air column is within normal limits. No pneumothorax, pulmonary edema, pleural effusion or confluent pulmonary opacity. Mild basilar predominant increased pulmonary interstitial markings, probably smoking related. No acute osseous abnormality identified. Negative visible bowel gas pattern. IMPRESSION: No acute cardiopulmonary abnormality or acute traumatic injury identified.  Electronically Signed   By: Genevie Ann M.D.   On: 01/18/2019 23:46   Ct Head Wo Contrast  Result Date: 01/18/2019 CLINICAL DATA:  58 year old female status post syncope with injury to the back of the head. History of multiple sclerosis, breast cancer. EXAM: CT HEAD WITHOUT CONTRAST TECHNIQUE: Contiguous axial images were obtained from the base of the skull through the vertex without intravenous contrast. COMPARISON:  Brain MRI 08/09/2017 and earlier. FINDINGS: Brain: Stable cerebral volume since 2018. Chronic bilateral white matter lesions are more apparent on MRI than CT, but no progression is evident. No  midline shift, ventriculomegaly, mass effect, evidence of mass lesion, intracranial hemorrhage or evidence of cortically based acute infarction. Vascular: No suspicious intracranial vascular hyperdensity. Skull: Negative. Sinuses/Orbits: Visible paranasal sinuses remain clear. There are mild bilateral mastoid effusions which appears stable, greater on the right. Other: Right posterior convexity scalp hematoma or contusion on series 3, image 52. Underlying calvarium intact. Negative other visible orbit and scalp soft tissues. IMPRESSION: 1. Posterior scalp soft tissue injury without underlying skull fracture. 2. No acute intracranial abnormality. Chronic cerebral white matter disease, grossly stable since 2018. Electronically Signed   By: Genevie Ann M.D.   On: 01/18/2019 23:43    EKG: Independently reviewed. Sinus rhythm, non-specific ST-T abnormality.   Assessment/Plan   1. Syncope  - Presents after a syncopal episode preceded by lightheadedness but no chest pain or palpitations  - EKG with non-specific ST-T abnormalities and no prior for comparison; no blocks, pauses, or arrhythmia   - She had echocardiogram last month without HCM, aortic stenosis, pulm HTN, or pericardial effusion  - She had been having loose stools leading up to this, becomes lightheaded with standing today, appears hypovolemic, and  this is likely secondary to orthostatic hypotension  - She was given a liter of NS in ED  - Check orthostatic vitals, continue fluid-resuscitation, continue cardiac monitoring   2. Nausea with vomiting and diarrhea  - Patient has intermittent N/V/D since starting chemo, had some loose stools and vomiting once on day of presentation, but has not had any abdominal pain, and no diarrhea since she took Imodium at home  - Exam is benign  - Continue IVF hydration and electrolyte replacement    3. Breast cancer   - Undergoing neoadjuvant chemotherapy  - She will continue oncology follow-up    4. Multiple sclerosis  - Stable  - Med-rec pending   5. Hypertension - BP low in ED  - Hold antihypertensives and continue IVF hydration    6. Hypokalemia  - Secondary to GI-losses, replaced  - Repeat chem panel in am   7. Leukocytosis  - WBC is 17,400 on admission without fever  - CXR is clear, no urinary sxs, abd exam benign, no meningismus, no apparent cellulitis  - Likely reactive, obtain blood cultures if febrile    DVT prophylaxis: Lovenox  Code Status: Full  Family Communication: Discussed with patient  Consults called: none Admission status: Observation     Vianne Bulls, MD [facility] Pager [phone]  If 7PM-7AM, please contact night-coverage [email or web address] Password [facility]  01/19/2019, 12:53 AM

## 2019-01-19 NOTE — Evaluation (Signed)
Physical Therapy Evaluation Patient Details Name: Debbie Watts MRN: [id number] DOB: 1961/01/24 Today's Date: 01/19/2019   History of Present Illness  58 y.o. female with medical history significant for hypertension, migraines, multiple sclerosis, and cancer of the right breast undergoing neoadjuvant chemotherapy, now presenting to the emergency department after a syncopal episode.  Clinical Impression  Patient evaluated by Physical Therapy with no further acute PT needs identified. All education has been completed and the patient has no further questions.  See below for any follow-up Physical Therapy or equipment needs. PT is signing off. Thank you for this referral.  Pt feels she is at her baseline; no f/u recommended at this time;  it would be beneficial for pt to continue to amb with nursing and or mobility staff so she does not get weaker while hospitalized      Follow Up Recommendations No PT follow up    Equipment Recommendations  None recommended by PT    Recommendations for Other Services       Precautions / Restrictions        Mobility  Bed Mobility Overal bed mobility: Needs Assistance Bed Mobility: Supine to Sit     Supine to sit: Modified independent (Device/Increase time)        Transfers   Equipment used: None Transfers: Sit to/from Stand Sit to Stand: Supervision         General transfer comment: for safety  Ambulation/Gait Ambulation/Gait assistance: Supervision Gait Distance (Feet): 180 Feet Assistive device: None Gait Pattern/deviations: Step-through pattern     General Gait Details: slight drifiting initially, no LOB, maintains RLE in external rotation;   Stairs            Wheelchair Mobility    Modified Rankin (Stroke Patients Only)       Balance Overall balance assessment: Needs assistance Sitting-balance support: No upper extremity supported;Feet supported Sitting balance-Leahy Scale: Normal     Standing balance  support: No upper extremity supported;During functional activity   Standing balance comment: no tested beyond min challenges; no overt LOB during giat; pt able to reach to floor, stand wash hands without LOB                             Pertinent Vitals/Pain Pain Assessment: No/denies pain    Home Living Family/patient expects to be discharged to:: Private residence Living Arrangements: Spouse/significant other   Type of Home: House Home Access: Ramped entrance     Home Layout: One level   Additional Comments: husband is a double amputee per pt ( he is independent per pt from w/c level)     Prior Function Level of Independence: Independent               Hand Dominance        Extremity/Trunk Assessment   Upper Extremity Assessment Upper Extremity Assessment: Overall WFL for tasks assessed    Lower Extremity Assessment Lower Extremity Assessment: Overall WFL for tasks assessed       Communication   Communication: No difficulties  Cognition Arousal/Alertness: Awake/alert Behavior During Therapy: WFL for tasks assessed/performed Overall Cognitive Status: Within Functional Limits for tasks assessed                                        General Comments      Exercises  Assessment/Plan    PT Assessment Patent does not need any further PT services  PT Problem List         PT Treatment Interventions      PT Goals (Current goals can be found in the Care Plan section)  Acute Rehab PT Goals Patient Stated Goal: home soon PT Goal Formulation: All assessment and education complete, DC therapy    Frequency     Barriers to discharge        Co-evaluation               AM-PAC PT "6 Clicks" Mobility  Outcome Measure Help needed turning from your back to your side while in a flat bed without using bedrails?: None Help needed moving from lying on your back to sitting on the side of a flat bed without using bedrails?:  None Help needed moving to and from a bed to a chair (including a wheelchair)?: None Help needed standing up from a chair using your arms (e.g., wheelchair or bedside chair)?: None Help needed to walk in hospital room?: A Little Help needed climbing 3-5 steps with a railing? : A Little 6 Click Score: 22    End of Session Equipment Utilized During Treatment: Gait belt Activity Tolerance: Patient tolerated treatment well Patient left: in bed;with call bell/phone within reach;with bed alarm set   PT Visit Diagnosis: Unsteadiness on feet (R26.81)    Time: 1660-6004 PT Time Calculation (min) (ACUTE ONLY): 13 min   Charges:   PT Evaluation $PT Eval Low Complexity: 1 Low          Kenyon Ana, PT  Pager: [phone] Acute Rehab Dept [facility]): [phone]   01/19/2019   [facility] 01/19/2019, 6:30 PM

## 2019-01-19 NOTE — ED Provider Notes (Signed)
[facility] DEPT Provider Note   CSN: [id number] Arrival date & time: 01/18/19  2104    History   Chief Complaint Chief Complaint  Patient presents with   Loss of Consciousness    HPI Debbie Watts is a 58 y.o. female.     The history is provided by the patient.  Loss of Consciousness  Episode history:  Single Most recent episode:  Today Timing:  Constant Progression:  Resolved Chronicity:  New Context: not blood draw, not inactivity and not urination   Relieved by:  Nothing Worsened by:  Nothing Ineffective treatments:  None tried Associated symptoms: nausea and vomiting   Associated symptoms: no anxiety, no chest pain, no confusion, no diaphoresis, no difficulty breathing, no dizziness, no fever, no focal sensory loss, no focal weakness, no malaise/fatigue, no palpitations, no recent fall, no recent injury, no recent surgery, no rectal bleeding, no seizures, no shortness of breath, no visual change and no weakness   Risk factors: no seizures   Patient with BCA 3 weeks post chemotherapy reports normal BM this am then diarrhea, she then had syncope.  Came to and have nausea and vomiting x 4 and more diarrhea.  No f/c/r.  No congestion.  No anosmia.  No cough.  No SOB.  No CP.  No travel no sick exposures.    Past Medical History:  Diagnosis Date   Depression    Deviated nasal septum    Hearing loss 06/11/2014   Bilateral   Herniated cervical disc    Hypertension    Migraine    Miscarriage    2   MS (multiple sclerosis) ([place])     Patient Active Problem List   Diagnosis Date Noted   Syncope 01/19/2019   Nausea vomiting and diarrhea 01/19/2019   Leukocytosis 01/19/2019   Malignant neoplasm of upper-outer quadrant of right breast in female, estrogen receptor positive ([place]) 11/27/2018   Neurogenic claudication due to lumbar spinal stenosis 09/19/2018   Urinary tract infection without hematuria 08/10/2018   Chronic  left-sided thoracic back pain 07/20/2018   Tendon nodule 04/21/2018   Solar lentigo 04/21/2018   Preventative health care 04/20/2018   Acute cystitis without hematuria 03/16/2018   Gait disturbance 01/12/2018   Polyuria 12/08/2017   Easy bruising 08/31/2017   Chronic obstructive pulmonary disease ([place]) 11/30/2016   Essential hypertension 01/26/2016   Migraine headache 06/23/2014   Multiple sclerosis ([place]) 06/23/2014   Paresthesias 06/23/2014   Restless leg syndrome 06/23/2014   Hearing loss, bilateral 06/11/2014   Major depression, recurrent, chronic ([place]) 06/11/2014   Cervical herniated disc 06/11/2014   Cigarette nicotine dependence without complication 16/38/4665   Lumbar herniated disc 06/11/2014   Perimenopausal symptoms 06/11/2014    Past Surgical History:  Procedure Laterality Date   NASAL SEPTUM SURGERY     spinal injections       OB History   No obstetric history on file.      Home Medications    Prior to Admission medications   Medication Sig Start Date End Date Taking? Authorizing Provider  Acetaminophen-Caffeine (TENSION HEADACHE RELIEF PO) Take by mouth.    [provider]  betamethasone valerate lotion (VALISONE) 0.1 % APPLY 1 APPLICATION TOPICALLY 2 TIMES DAILY. 08/20/16   [provider]  carbidopa-levodopa (SINEMET IR) 10-100 MG tablet TAKE 1 TABLET BY MOUTH AT BEDTIME. 09/25/18   Sater, Nanine Means, MD  cyclobenzaprine (FLEXERIL) 10 MG tablet TAKE 1 TABLET BY MOUTH EVERYDAY AT BEDTIME 11/08/18  Neva Seat, MD  dexamethasone (DECADRON) 4 MG tablet Take 1 tablet (4 mg total) by mouth daily. Take 1 tablet day before chemo and 1 tablet day after chemo with food 11/29/18   Nicholas Lose, MD  diclofenac (VOLTAREN) 75 MG EC tablet TAKE 1 TABLET BY MOUTH TWICE A DAY 12/06/18   Neva Seat, MD  Dimethyl Fumarate 240 MG CPDR Take 1 capsule (240 mg total) by mouth 2 (two) times daily. 11/06/18   Sater, Nanine Means, MD    eletriptan (RELPAX) 40 MG tablet Take 40 mg by mouth as needed for migraine or headache. One tablet by mouth at onset of headache. May repeat in 2 hours if headache persists or recurs.    [provider]  ferrous sulfate 325 (65 FE) MG tablet Take 325 mg by mouth daily.    [provider]  fluconazole (DIFLUCAN) 100 MG tablet Take 1 tablet (100 mg total) by mouth daily. 12/19/18   Nicholas Lose, MD  gabapentin (NEURONTIN) 300 MG capsule TAKE 2 CAPSULES (600 MG TOTAL) BY MOUTH 2 (TWO) TIMES DAILY. 11/20/18   Sater, Nanine Means, MD  lidocaine-prilocaine (EMLA) cream Apply to affected area once 11/29/18   Nicholas Lose, MD  lisinopril (PRINIVIL,ZESTRIL) 5 MG tablet TAKE 1 TABLET BY MOUTH EVERY DAY 06/09/18   Neva Seat, MD  LORazepam (ATIVAN) 0.5 MG tablet Take 1 tablet (0.5 mg total) by mouth at bedtime as needed (Nausea or vomiting). 11/29/18   Nicholas Lose, MD  ondansetron (ZOFRAN) 8 MG tablet Take 1 tablet (8 mg total) by mouth 2 (two) times daily as needed (Nausea or vomiting). Begin 4 days after chemotherapy. 11/29/18   Nicholas Lose, MD  oxyCODONE-acetaminophen (PERCOCET/ROXICET) 5-325 MG tablet Take 1 tablet by mouth every 8 (eight) hours as needed for severe pain. 12/21/18   Nicholas Lose, MD  PARoxetine (PAXIL) 20 MG tablet Take 1 tablet (20 mg total) by mouth daily. 06/14/18   Neva Seat, MD  prochlorperazine (COMPAZINE) 10 MG tablet Take 1 tablet (10 mg total) by mouth every 6 (six) hours as needed (Nausea or vomiting). 11/29/18   Nicholas Lose, MD  Tiotropium Bromide Monohydrate 1.25 MCG/ACT AERS Inhale into the lungs. 07/26/16   [provider]  traMADol (ULTRAM) 50 MG tablet TAKE 1 TABLET BY MOUTH TWICE A DAY AS NEEDED 12/11/18   Sater, Nanine Means, MD  VENTOLIN HFA 108 (90 Base) MCG/ACT inhaler Inhale 1-2 puffs into the lungs every 6 (six) hours as needed for wheezing or shortness of breath. 11/27/18   Neva Seat, MD    Family History Family History   Adopted: Yes  Problem Relation Age of Onset   Cancer Maternal Uncle        throat    Social History Social History   Tobacco Use   Smoking status: Current Every Day Smoker    Packs/day: 0.50    Years: 44.00    Pack years: 22.00    Types: Cigarettes   Smokeless tobacco: Never Used   Tobacco comment: 1/2  to 3/4 pack per day   Substance Use Topics   Alcohol use: Yes    Comment: 0-5 beers daily   Drug use: Yes    Types: Marijuana     Allergies   Procaine   Review of Systems Review of Systems  Constitutional: Negative for appetite change, diaphoresis, fever and malaise/fatigue.  HENT: Negative for congestion.   Respiratory: Negative for cough and shortness of breath.   Cardiovascular: Positive for syncope. Negative for chest  pain and palpitations.  Gastrointestinal: Positive for diarrhea, nausea and vomiting. Negative for abdominal pain and anal bleeding.  Genitourinary: Negative for dysuria.  Musculoskeletal: Negative for neck pain and neck stiffness.  Skin: Negative for rash.  Neurological: Positive for syncope. Negative for dizziness, focal weakness, seizures, facial asymmetry, weakness and numbness.  Psychiatric/Behavioral: Negative for confusion.  All other systems reviewed and are negative.    Physical Exam Updated Vital Signs BP (!) 81/52 (BP Location: Right Arm)    Pulse 97    Temp 98.5 F (36.9 C) (Oral)    Resp 18    Ht 5\' 2"  (1.575 m)    Wt 64 kg    SpO2 95%    BMI 25.79 kg/m   Physical Exam Vitals signs and nursing note reviewed.  Constitutional:      General: She is not in acute distress.    Appearance: She is normal weight.  HENT:     Head: Normocephalic and atraumatic.     Nose: Nose normal.  Eyes:     Conjunctiva/sclera: Conjunctivae normal.     Pupils: Pupils are equal, round, and reactive to light.  Neck:     Musculoskeletal: Normal range of motion and neck supple.  Cardiovascular:     Rate and Rhythm: Regular rhythm. Tachycardia  present.     Pulses: Normal pulses.     Heart sounds: Normal heart sounds.  Pulmonary:     Effort: Pulmonary effort is normal. No respiratory distress.     Breath sounds: Normal breath sounds. No stridor. No wheezing, rhonchi or rales.  Chest:     Chest wall: No tenderness.  Abdominal:     General: Abdomen is flat. Bowel sounds are normal.     Palpations: There is no mass.     Tenderness: There is no abdominal tenderness. There is no guarding.     Hernia: No hernia is present.  Musculoskeletal: Normal range of motion.  Skin:    General: Skin is warm and dry.     Capillary Refill: Capillary refill takes less than 2 seconds.  Neurological:     General: No focal deficit present.     Mental Status: She is alert and oriented to person, place, and time.     Deep Tendon Reflexes: Reflexes normal.  Psychiatric:        Mood and Affect: Mood normal.        Behavior: Behavior normal.      ED Treatments / Results  Labs (all labs ordered are listed, but only abnormal results are displayed) Results for orders placed or performed during the hospital encounter of 53/97/67  Basic metabolic panel  Result Value Ref Range   Sodium 134 (L) 135 - 145 mmol/L   Potassium 3.3 (L) 3.5 - 5.1 mmol/L   Chloride 100 98 - 111 mmol/L   CO2 25 22 - 32 mmol/L   Glucose, Bld 154 (H) 70 - 99 mg/dL   BUN 15 6 - 20 mg/dL   Creatinine, Ser 0.65 0.44 - 1.00 mg/dL   Calcium 8.2 (L) 8.9 - 10.3 mg/dL   GFR calc non Af Amer >60 >60 mL/min   GFR calc Af Amer >60 >60 mL/min   Anion gap 9 5 - 15  CBC  Result Value Ref Range   WBC 17.4 (H) 4.0 - 10.5 K/uL   RBC 3.84 (L) 3.87 - 5.11 MIL/uL   Hemoglobin 12.1 12.0 - 15.0 g/dL   HCT 36.9 36.0 - 46.0 %   MCV  96.1 80.0 - 100.0 fL   MCH 31.5 26.0 - 34.0 pg   MCHC 32.8 30.0 - 36.0 g/dL   RDW 16.4 (H) 11.5 - 15.5 %   Platelets 263 150 - 400 K/uL   nRBC 0.0 0.0 - 0.2 %  Urinalysis, Routine w reflex microscopic  Result Value Ref Range   Color, Urine AMBER (A) YELLOW    APPearance HAZY (A) CLEAR   Specific Gravity, Urine 1.015 1.005 - 1.030   pH 5.0 5.0 - 8.0   Glucose, UA NEGATIVE NEGATIVE mg/dL   Hgb urine dipstick NEGATIVE NEGATIVE   Bilirubin Urine NEGATIVE NEGATIVE   Ketones, ur 5 (A) NEGATIVE mg/dL   Protein, ur 30 (A) NEGATIVE mg/dL   Nitrite NEGATIVE NEGATIVE   Leukocytes,Ua TRACE (A) NEGATIVE   RBC / HPF 0-5 0 - 5 RBC/hpf   WBC, UA 6-10 0 - 5 WBC/hpf   Bacteria, UA NONE SEEN NONE SEEN   Squamous Epithelial / LPF 0-5 0 - 5   Mucus PRESENT    Hyaline Casts, UA PRESENT   CBG monitoring, ED  Result Value Ref Range   Glucose-Capillary 156 (H) 70 - 99 mg/dL  I-Stat beta hCG blood, ED  Result Value Ref Range   I-stat hCG, quantitative 7.3 (H) <5 mIU/mL   Comment 3           Dg Chest 2 View  Result Date: 01/18/2019 CLINICAL DATA:  58 year old female status post syncope with injury to the back of the head. History of multiple sclerosis, breast cancer. Smoker. EXAM: CHEST - 2 VIEW COMPARISON:  None. FINDINGS: Semi upright AP and lateral views of the chest. Left chest IJ approach power port in place. The entire catheter is not visible. Normal cardiac size and mediastinal contours. Low normal lung volumes. Visualized tracheal air column is within normal limits. No pneumothorax, pulmonary edema, pleural effusion or confluent pulmonary opacity. Mild basilar predominant increased pulmonary interstitial markings, probably smoking related. No acute osseous abnormality identified. Negative visible bowel gas pattern. IMPRESSION: No acute cardiopulmonary abnormality or acute traumatic injury identified. Electronically Signed   By: Genevie Ann M.D.   On: 01/18/2019 23:46   Ct Head Wo Contrast  Result Date: 01/18/2019 CLINICAL DATA:  58 year old female status post syncope with injury to the back of the head. History of multiple sclerosis, breast cancer. EXAM: CT HEAD WITHOUT CONTRAST TECHNIQUE: Contiguous axial images were obtained from the base of the skull through  the vertex without intravenous contrast. COMPARISON:  Brain MRI 08/09/2017 and earlier. FINDINGS: Brain: Stable cerebral volume since 2018. Chronic bilateral white matter lesions are more apparent on MRI than CT, but no progression is evident. No midline shift, ventriculomegaly, mass effect, evidence of mass lesion, intracranial hemorrhage or evidence of cortically based acute infarction. Vascular: No suspicious intracranial vascular hyperdensity. Skull: Negative. Sinuses/Orbits: Visible paranasal sinuses remain clear. There are mild bilateral mastoid effusions which appears stable, greater on the right. Other: Right posterior convexity scalp hematoma or contusion on series 3, image 52. Underlying calvarium intact. Negative other visible orbit and scalp soft tissues. IMPRESSION: 1. Posterior scalp soft tissue injury without underlying skull fracture. 2. No acute intracranial abnormality. Chronic cerebral white matter disease, grossly stable since 2018. Electronically Signed   By: Genevie Ann M.D.   On: 01/18/2019 23:43   EKG EKG Interpretation  Date/Time:  Thursday January 18 2019 21:11:30 EDT Ventricular Rate:  99 PR Interval:    QRS Duration: 90 QT Interval:  332 QTC Calculation: 426 R  Axis:   81 Text Interpretation:  Sinus rhythm RSR' in V1 or V2, probably normal variant st t changes inferiorly and laterally Baseline wander in lead(s) V5 Confirmed by Randal Buba, Shawndrea Rutkowski ([id number]) on 01/18/2019 11:15:33 PM   Radiology Dg Chest 2 View  Result Date: 01/18/2019 CLINICAL DATA:  58 year old female status post syncope with injury to the back of the head. History of multiple sclerosis, breast cancer. Smoker. EXAM: CHEST - 2 VIEW COMPARISON:  None. FINDINGS: Semi upright AP and lateral views of the chest. Left chest IJ approach power port in place. The entire catheter is not visible. Normal cardiac size and mediastinal contours. Low normal lung volumes. Visualized tracheal air column is within normal limits. No  pneumothorax, pulmonary edema, pleural effusion or confluent pulmonary opacity. Mild basilar predominant increased pulmonary interstitial markings, probably smoking related. No acute osseous abnormality identified. Negative visible bowel gas pattern. IMPRESSION: No acute cardiopulmonary abnormality or acute traumatic injury identified. Electronically Signed   By: Genevie Ann M.D.   On: 01/18/2019 23:46   Ct Head Wo Contrast  Result Date: 01/18/2019 CLINICAL DATA:  58 year old female status post syncope with injury to the back of the head. History of multiple sclerosis, breast cancer. EXAM: CT HEAD WITHOUT CONTRAST TECHNIQUE: Contiguous axial images were obtained from the base of the skull through the vertex without intravenous contrast. COMPARISON:  Brain MRI 08/09/2017 and earlier. FINDINGS: Brain: Stable cerebral volume since 2018. Chronic bilateral white matter lesions are more apparent on MRI than CT, but no progression is evident. No midline shift, ventriculomegaly, mass effect, evidence of mass lesion, intracranial hemorrhage or evidence of cortically based acute infarction. Vascular: No suspicious intracranial vascular hyperdensity. Skull: Negative. Sinuses/Orbits: Visible paranasal sinuses remain clear. There are mild bilateral mastoid effusions which appears stable, greater on the right. Other: Right posterior convexity scalp hematoma or contusion on series 3, image 52. Underlying calvarium intact. Negative other visible orbit and scalp soft tissues. IMPRESSION: 1. Posterior scalp soft tissue injury without underlying skull fracture. 2. No acute intracranial abnormality. Chronic cerebral white matter disease, grossly stable since 2018. Electronically Signed   By: Genevie Ann M.D.   On: 01/18/2019 23:43    Procedures Procedures (including critical care time)  Medications Ordered in ED Medications  sodium chloride 0.9 % bolus 1,000 mL (has no administration in time range)  cefTRIAXone (ROCEPHIN)  injection 1 g (has no administration in time range)  cefTRIAXone (ROCEPHIN) 1 g in sodium chloride 0.9 % 100 mL IVPB (has no administration in time range)  sodium chloride flush (NS) 0.9 % injection 3 mL (3 mLs Intravenous Given 01/18/19 2219)      Final Clinical Impressions(s) / ED Diagnoses   Final diagnoses:  Syncope and collapse  Non-intractable vomiting with nausea, unspecified vomiting type  Diarrhea, unspecified type  Malignant neoplasm of female breast, unspecified estrogen receptor status, unspecified laterality, unspecified site of breast ([place])    Admit to medicine.     Adylynn Hertenstein, MD 01/19/19 [phone]

## 2019-01-19 NOTE — Progress Notes (Signed)
Page sent to Dr Sue Lush regarding continuing IV fluids, as the order has expired. Donne Hazel, RN

## 2019-01-20 LAB — URINE CULTURE
Culture: NO GROWTH
Special Requests: NORMAL

## 2019-01-20 LAB — GLUCOSE, CAPILLARY: Glucose-Capillary: 114 mg/dL — ABNORMAL HIGH (ref 70–99)

## 2019-01-20 LAB — D-DIMER, QUANTITATIVE: D-Dimer, Quant: 1.21 ug/mL-FEU — ABNORMAL HIGH (ref 0.00–0.50)

## 2019-01-20 LAB — TROPONIN I: Troponin I: <0.03 ng/mL (ref <0.03)

## 2019-01-20 NOTE — Discharge Summary (Signed)
Physician Discharge Summary  Patient ID: Debbie Watts MRN: [id number] DOB/AGE: Mar 02, 1961 58 y.o.  Admit date: 01/18/2019 Discharge date: 01/20/2019  Admission Diagnoses:  Discharge Diagnoses:  Principal Problem:   Syncope Active Problems:   Multiple sclerosis ([place])   Major depression, recurrent, chronic ([facility])   Essential hypertension   Malignant neoplasm of upper-outer quadrant of right breast in female, estrogen receptor positive ([facility])   Nausea vomiting and diarrhea   Leukocytosis   Discharged Condition: stable  Hospital Course:   Debbie Watts is a 58 year old Caucasian female, with past medical history significant for hypertension, migraines, multiple sclerosis, and cancer of the right breast, currently undergoing neoadjuvant chemotherapy.  Patient was admitted with syncope.  Patient was worked up extensively.  Troponins came back negative.  D-dimer was elevated, but could have been related to the malignancy.  Patient is currently on room air, without any shortness of breath or pleuritic chest pain.  Patient has been adequately hydrated.  Patient is eager to be discharged back home.  Patient will follow with the primary care provider and the oncology team within 1 to 2 weeks of discharge.  1. Syncope  - Presents after a syncopal episode preceded by lightheadedness but no chest pain or palpitations  - EKG with non-specific ST-T abnormalities and no prior for comparison; no blocks, pauses, or arrhythmia   -Patient had echocardiogram last month without HCM, aortic stenosis, pulm HTN, or pericardial effusion  - Patient reported episodes of loose stools prior to onset of lightheadedness.   - Orthostasis was monitored closely during the hospital stay.  2. Nausea with vomiting and diarrhea  - Patient has had intermittent N/V/D since starting chemo -Patient had some loose stools and vomiting  on day of presentation, but has not had any abdominal pain, and no diarrhea since she  took Imodium at home  -Patient was managed supportively with IVF hydration and electrolyte replacement    3. Breast cancer   - Undergoing neoadjuvant chemotherapy  - She will continue oncology follow-up    4. Multiple sclerosis  - Stable  - Med-rec pending   5. Hypertension - BP low in ED  - Antihypertensives were initially held, and patient adequately hydrated.      6. Hypokalemia  - This has been monitored and replaced.  7. Leukocytosis  - WBC was 17,400 on admission without fever, likely reactive. - CXR was clear, no urinary symptoms, abdominal examination was benign.    Consults: None  Significant Diagnostic Studies:   Discharge Exam: Blood pressure 126/88, pulse (!) 111, temperature 98.2 F (36.8 C), temperature source Oral, resp. rate 16, height 5\' 2"  (1.575 m), weight 62.3 kg, SpO2 97 %.  Disposition: Discharge disposition: 01-Home or Self Care   Discharge Instructions    Diet - low sodium heart healthy   Complete by:  As directed    Increase activity slowly   Complete by:  As directed      Allergies as of 01/20/2019      Reactions   Procaine Shortness Of Breath      Medication List    STOP taking these medications   cyclobenzaprine 10 MG tablet Commonly known as:  FLEXERIL   traMADol 50 MG tablet Commonly known as:  ULTRAM     TAKE these medications   betamethasone valerate lotion 0.1 % Commonly known as:  VALISONE Apply 1 application topically daily.   carbidopa-levodopa 10-100 MG tablet Commonly known as:  SINEMET IR TAKE 1 TABLET BY MOUTH AT  BEDTIME.   dexamethasone 4 MG tablet Commonly known as:  DECADRON Take 1 tablet (4 mg total) by mouth daily. Take 1 tablet day before chemo and 1 tablet day after chemo with food Notes to patient:  Home regimen   diclofenac 75 MG EC tablet Commonly known as:  VOLTAREN TAKE 1 TABLET BY MOUTH TWICE A DAY   Dimethyl Fumarate 240 MG Cpdr Take 1 capsule (240 mg total) by mouth 2 (two) times  daily.   eletriptan 40 MG tablet Commonly known as:  RELPAX Take 40 mg by mouth as needed for migraine or headache. One tablet by mouth at onset of headache. May repeat in 2 hours if headache persists or recurs.   fluconazole 100 MG tablet Commonly known as:  DIFLUCAN Take 1 tablet (100 mg total) by mouth daily.   fluticasone 50 MCG/ACT nasal spray Commonly known as:  FLONASE Place 2 sprays into both nostrils at bedtime.   gabapentin 300 MG capsule Commonly known as:  NEURONTIN TAKE 2 CAPSULES (600 MG TOTAL) BY MOUTH 2 (TWO) TIMES DAILY.   lidocaine-prilocaine cream Commonly known as:  EMLA Apply to affected area once   lisinopril 5 MG tablet Commonly known as:  PRINIVIL,ZESTRIL TAKE 1 TABLET BY MOUTH EVERY DAY   LORazepam 0.5 MG tablet Commonly known as:  Ativan Take 1 tablet (0.5 mg total) by mouth at bedtime as needed (Nausea or vomiting).   ondansetron 8 MG tablet Commonly known as:  Zofran Take 1 tablet (8 mg total) by mouth 2 (two) times daily as needed (Nausea or vomiting). Begin 4 days after chemotherapy.   oxyCODONE-acetaminophen 5-325 MG tablet Commonly known as:  PERCOCET/ROXICET Take 1 tablet by mouth every 8 (eight) hours as needed for severe pain.   PARoxetine 20 MG tablet Commonly known as:  PAXIL Take 1 tablet (20 mg total) by mouth daily.   prochlorperazine 10 MG tablet Commonly known as:  COMPAZINE Take 1 tablet (10 mg total) by mouth every 6 (six) hours as needed (Nausea or vomiting).   Ventolin HFA 108 (90 Base) MCG/ACT inhaler Generic drug:  albuterol Inhale 1-2 puffs into the lungs every 6 (six) hours as needed for wheezing or shortness of breath.        SignedBonnell Public 01/20/2019, 1:04 PM

## 2019-01-20 NOTE — Progress Notes (Signed)
RN reviewed discharge instructions with patient. All questions answered.   Paperwork and prescriptions given.   RN rolled patient down with all belongings to family car.

## 2019-01-21 NOTE — Assessment & Plan Note (Signed)
11/21/2018:Screening detected right breast mass upper outer quadrant, in addition to areas of calcifications which were not biopsied. By ultrasound she had 2 breast masses 12 o'clock position 2.9 cm: Grade 2 IDC with high-grade DCIS ER 100%, PR 70%, Ki-67 10%, HER-2 3+ by IHC and 11 o'clock position 1 cm, ER 90%, PR 50%, Ki-67 15%, HER-2 3+ by IHC, T2N0 stage Ib  Recommendation: 1. Neoadjuvant chemotherapy with TCH Perjeta 6 cycles followed by Herceptinand Perjeta versus Kadcylamaintenance for 1 year 2. Followed bymastectomy versusbreast conserving surgery with sentinel lymph node study 3. Followed by adjuvant radiation therapy if patient had lumpectomy 4.Followed by adjuvant antiestrogen therapy  Breast MRI 12/03/2018: 2.3 cm enhancing mass 4 o'clock position right breast, 11 o'clock position biopsy-proven malignancy area did not show any discrete mass -------------------------------------------------------------------------------------------------------------------------------------------------------- Current treatment: Cycle 3 day 1 TCH Perjetastarted 12/13/2018 Labs reviewed Echocardiogram: 12/06/2018: EF greater than 65%  Chemo toxicities: 1.  Bone pain especially low back pain as result of Neulasta: I discussed with her about Claritin with each treatment 2.  Diarrhea: Well controlled with Imodium 3.  Nausea: Moderate 4.  Migraine headache: Improved with Excedrin 5.  Fatigue due to chemotherapy 6. Syncope: 3/26-3/28: Syncope Hospitalization  Return to clinic in 3 weeks for cycle 4

## 2019-01-22 ENCOUNTER — Inpatient Hospital Stay: Payer: Medicare HMO

## 2019-01-22 ENCOUNTER — Inpatient Hospital Stay (HOSPITAL_BASED_OUTPATIENT_CLINIC_OR_DEPARTMENT_OTHER): Payer: Medicare HMO | Admitting: Hematology and Oncology

## 2019-01-22 ENCOUNTER — Other Ambulatory Visit: Payer: Self-pay | Admitting: Neurology

## 2019-01-22 ENCOUNTER — Other Ambulatory Visit: Payer: Self-pay

## 2019-01-22 DIAGNOSIS — Z170 Estrogen receptor positive status [ER+]: Principal | ICD-10-CM

## 2019-01-22 DIAGNOSIS — Z5112 Encounter for antineoplastic immunotherapy: Secondary | ICD-10-CM | POA: Diagnosis not present

## 2019-01-22 DIAGNOSIS — C50411 Malignant neoplasm of upper-outer quadrant of right female breast: Secondary | ICD-10-CM

## 2019-01-22 DIAGNOSIS — Z5111 Encounter for antineoplastic chemotherapy: Secondary | ICD-10-CM | POA: Diagnosis not present

## 2019-01-22 DIAGNOSIS — Z5189 Encounter for other specified aftercare: Secondary | ICD-10-CM | POA: Diagnosis not present

## 2019-01-22 LAB — CBC WITH DIFFERENTIAL (CANCER CENTER ONLY)
Abs Immature Granulocytes: 0.03 10*3/uL (ref 0.00–0.07)
Basophils Absolute: 0.1 10*3/uL (ref 0.0–0.1)
Basophils Relative: 1 %
EOS ABS: 0 10*3/uL (ref 0.0–0.5)
Eosinophils Relative: 0 %
HCT: 37 % (ref 36.0–46.0)
Hemoglobin: 12.1 g/dL (ref 12.0–15.0)
Immature Granulocytes: 0 %
Lymphocytes Relative: 13 %
Lymphs Abs: 0.9 10*3/uL (ref 0.7–4.0)
MCH: 30.9 pg (ref 26.0–34.0)
MCHC: 32.7 g/dL (ref 30.0–36.0)
MCV: 94.6 fL (ref 80.0–100.0)
Monocytes Absolute: 0.7 10*3/uL (ref 0.1–1.0)
Monocytes Relative: 10 %
Neutro Abs: 5.3 10*3/uL (ref 1.7–7.7)
Neutrophils Relative %: 76 %
Platelet Count: 347 10*3/uL (ref 150–400)
RBC: 3.91 MIL/uL (ref 3.87–5.11)
RDW: 16.7 % — ABNORMAL HIGH (ref 11.5–15.5)
WBC: 7 10*3/uL (ref 4.0–10.5)
nRBC: 0 % (ref 0.0–0.2)

## 2019-01-22 LAB — CMP (CANCER CENTER ONLY)
ALT: 88 U/L — ABNORMAL HIGH (ref 0–44)
AST: 34 U/L (ref 15–41)
Albumin: 3.7 g/dL (ref 3.5–5.0)
Alkaline Phosphatase: 96 U/L (ref 38–126)
Anion gap: 9 (ref 5–15)
BUN: 20 mg/dL (ref 6–20)
CO2: 28 mmol/L (ref 22–32)
Calcium: 8.7 mg/dL — ABNORMAL LOW (ref 8.9–10.3)
Chloride: 101 mmol/L (ref 98–111)
Creatinine: 0.79 mg/dL (ref 0.44–1.00)
GFR, Est AFR Am: >60 mL/min (ref >60)
Glucose, Bld: 130 mg/dL — ABNORMAL HIGH (ref 70–99)
Potassium: 3.9 mmol/L (ref 3.5–5.1)
Sodium: 138 mmol/L (ref 135–145)
TOTAL PROTEIN: 6.2 g/dL — AB (ref 6.5–8.1)
Total Bilirubin: 0.3 mg/dL (ref 0.3–1.2)

## 2019-01-22 MED ORDER — OXYCODONE-ACETAMINOPHEN 5-325 MG PO TABS: 1.0000 | ORAL_TABLET | Freq: Three times a day (TID) | ORAL | 0 refills | Status: DC | PRN

## 2019-01-22 NOTE — Progress Notes (Signed)
HEMATOLOGY-ONCOLOGY [name] VISIT PROGRESS NOTE  I connected with Debbie Watts on 01/22/2019 at 11:15 AM EDT by Webex video conference and verified that I am speaking with the correct person using two identifiers.  I discussed the limitations, risks, security and privacy concerns of performing an evaluation and management service by Webex and the availability of in person appointments.  I also discussed with the patient that there may be a patient responsible charge related to this service. The patient expressed understanding and agreed to proceed.     CHIEF COMPLIANT: Cycle 3 TCHP  INTERVAL HISTORY: Debbie Watts is a 58 yr old on chemo with TCHP. She was in hospital for syncope felt to be due to diarrhea from chemo. She was initially constipated and then had diarrhea. She also had nausea and headaches (Migraines) takes tylenol and excedrin. Back also hurts (arthritis) takes oxycodone.     Malignant neoplasm of upper-outer quadrant of right breast in female, estrogen receptor positive ([place])   11/21/2018 Initial Diagnosis    Screening detected right breast mass upper outer quadrant, in addition to areas of calcifications which were not biopsied.  By ultrasound she had 2 breast masses 12 o'clock position 2.9 cm: Grade 2 IDC with high-grade DCIS ER 100%, PR 70%, Ki-67 10%, HER-2 3+ by IHC and 11 o'clock position 1 cm, ER 90%, PR 50%, Ki-67 15%, HER-2 3+ by IHC, T2N0 stage Ib    11/29/2018 Cancer Staging    Staging form: Breast, AJCC 8th Edition - Clinical stage from 11/29/2018: Stage IB (cT2, cN0, cM0, G3, ER+, PR+, HER2+) - Signed by Debbie Lose, MD on 11/29/2018    12/12/2018 -  Neo-Adjuvant Chemotherapy    Neoadjuvant chemotherapy with [facility] Perjeta    01/18/2019 - 01/20/2019 Hospital Admission    Syncope     Observations/Objective:  I have reviewed the data as listed CMP Latest Ref Rng & Units 01/19/2019 01/18/2019 01/02/2019  Glucose 70 - 99 mg/dL 121(H) 154(H) 103(H)  BUN 6 - 20  mg/dL _0 Creatinine 0.44 - 1.00 mg/dL 0.63 0.65 0.67  Sodium 135 - 145 mmol/L 140 134(L) 139  Potassium 3.5 - 5.1 mmol/L 3.8 3.3(L) 4.0  Chloride 98 - 111 mmol/L 108 100 105  CO2 22 - 32 mmol/L _1 Calcium 8.9 - 10.3 mg/dL 8.0(L) 8.2(L) 8.2(L)  Total Protein 6.5 - 8.1 g/dL - - 6.1(L)  Total Bilirubin 0.3 - 1.2 mg/dL - - 0.3  Alkaline Phos 38 - 126 U/L - - 97  AST 15 - 41 U/L - - 32  ALT 0 - 44 U/L - - 93(H)    Lab Results  Component Value Date   WBC 7.0 01/22/2019   HGB 12.1 01/22/2019   HCT 37.0 01/22/2019   MCV 94.6 01/22/2019   PLT 347 01/22/2019   NEUTROABS 5.3 01/22/2019      Assessment Plan:  Malignant neoplasm of upper-outer quadrant of right breast in female, estrogen receptor positive ([place]) 11/21/2018:Screening detected right breast mass upper outer quadrant, in addition to areas of calcifications which were not biopsied. By ultrasound she had 2 breast masses 12 o'clock position 2.9 cm: Grade 2 IDC with high-grade DCIS ER 100%, PR 70%, Ki-67 10%, HER-2 3+ by IHC and 11 o'clock position 1 cm, ER 90%, PR 50%, Ki-67 15%, HER-2 3+ by IHC, T2N0 stage Ib  Recommendation: 1. Neoadjuvant chemotherapy with TCH Perjeta 6 cycles followed by Herceptinand Perjeta versus Kadcylamaintenance for 1 year 2. Followed bymastectomy versusbreast  conserving surgery with sentinel lymph node study 3. Followed by adjuvant radiation therapy if patient had lumpectomy 4.Followed by adjuvant antiestrogen therapy  Breast MRI 12/03/2018: 2.3 cm enhancing mass 4 o'clock position right breast, 11 o'clock position biopsy-proven malignancy area did not show any discrete mass -------------------------------------------------------------------------------------------------------------------------------------------------------- Current treatment: tomorrow is Cycle 3 day 1 TCH Perjetastarted 12/13/2018 Labs reviewed Echocardiogram: 12/06/2018: EF greater than 65%  Chemo toxicities: 1.   Bone pain especially low back pain as result of Neulasta: I discussed with her about Claritin with each treatment 2.  Diarrhea: Well controlled with Imodium 3.  Nausea: Moderate 4.  Migraine headache: Improved with Excedrin 5.  Fatigue due to chemotherapy 6. Syncope: 3/26-3/28: Syncope Hospitalization, encouraged her to increase fluid intake and not drink alcohol. 7. Monitoring her LFTs. Ok to treat with ALT 88  Return to clinic in 3 weeks for cycle  4    I discussed the assessment and treatment plan with the patient. The patient was provided an opportunity to ask questions and all were answered. The patient agreed with the plan and demonstrated an understanding of the instructions. The patient was advised to call back or seek an in-person evaluation if the symptoms worsen or if the condition fails to improve as anticipated.   I provided 20 minutes of non-face-to-face time during this encounter.   Debbie Ohara, MD  01/22/2019

## 2019-01-23 ENCOUNTER — Telehealth: Payer: Self-pay

## 2019-01-23 ENCOUNTER — Inpatient Hospital Stay: Payer: Medicare HMO

## 2019-01-23 ENCOUNTER — Other Ambulatory Visit: Payer: Self-pay

## 2019-01-23 ENCOUNTER — Other Ambulatory Visit: Payer: Self-pay | Admitting: *Deleted

## 2019-01-23 VITALS — BP 133/85 | HR 94 | Temp 97.7°F | Resp 16

## 2019-01-23 DIAGNOSIS — Z5189 Encounter for other specified aftercare: Secondary | ICD-10-CM | POA: Diagnosis not present

## 2019-01-23 DIAGNOSIS — C50411 Malignant neoplasm of upper-outer quadrant of right female breast: Secondary | ICD-10-CM

## 2019-01-23 DIAGNOSIS — Z5112 Encounter for antineoplastic immunotherapy: Secondary | ICD-10-CM | POA: Diagnosis not present

## 2019-01-23 DIAGNOSIS — Z5111 Encounter for antineoplastic chemotherapy: Secondary | ICD-10-CM | POA: Diagnosis not present

## 2019-01-23 DIAGNOSIS — Z170 Estrogen receptor positive status [ER+]: Principal | ICD-10-CM

## 2019-01-23 MED ORDER — ACETAMINOPHEN 325 MG PO TABS
650.0000 mg | ORAL_TABLET | Freq: Once | ORAL | Status: AC
  Administered 2019-01-23: 650 mg via ORAL

## 2019-01-23 MED ORDER — DIPHENHYDRAMINE HCL 25 MG PO CAPS
ORAL_CAPSULE | ORAL | Status: AC
  Filled 2019-01-23: qty 2

## 2019-01-23 MED ORDER — TRASTUZUMAB CHEMO 150 MG IV SOLR
6.0000 mg/kg | Freq: Once | INTRAVENOUS | Status: AC
  Administered 2019-01-23: 378 mg via INTRAVENOUS
  Filled 2019-01-23: qty 18

## 2019-01-23 MED ORDER — ACETAMINOPHEN 325 MG PO TABS
ORAL_TABLET | ORAL | Status: AC
  Filled 2019-01-23: qty 2

## 2019-01-23 MED ORDER — SODIUM CHLORIDE 0.9 % IV SOLN
420.0000 mg | Freq: Once | INTRAVENOUS | Status: AC
  Administered 2019-01-23: 420 mg via INTRAVENOUS
  Filled 2019-01-23: qty 14

## 2019-01-23 MED ORDER — SODIUM CHLORIDE 0.9 % IV SOLN
Freq: Once | INTRAVENOUS | Status: AC
  Administered 2019-01-23: 09:00:00 via INTRAVENOUS
  Filled 2019-01-23: qty 5

## 2019-01-23 MED ORDER — HEPARIN SOD (PORK) LOCK FLUSH 100 UNIT/ML IV SOLN
500.0000 [IU] | Freq: Once | INTRAVENOUS | Status: AC | PRN
  Administered 2019-01-23: 500 [IU]
  Filled 2019-01-23: qty 5

## 2019-01-23 MED ORDER — SODIUM CHLORIDE 0.9 % IV SOLN
Freq: Once | INTRAVENOUS | Status: AC
  Administered 2019-01-23: 09:00:00 via INTRAVENOUS
  Filled 2019-01-23: qty 250

## 2019-01-23 MED ORDER — PALONOSETRON HCL INJECTION 0.25 MG/5ML
0.2500 mg | Freq: Once | INTRAVENOUS | Status: AC
  Administered 2019-01-23: 0.25 mg via INTRAVENOUS

## 2019-01-23 MED ORDER — SODIUM CHLORIDE 0.9 % IV SOLN
618.6000 mg | Freq: Once | INTRAVENOUS | Status: AC
  Administered 2019-01-23: 620 mg via INTRAVENOUS
  Filled 2019-01-23: qty 62

## 2019-01-23 MED ORDER — SODIUM CHLORIDE 0.9% FLUSH
10.0000 mL | INTRAVENOUS | Status: DC | PRN
  Administered 2019-01-23: 10 mL
  Filled 2019-01-23: qty 10

## 2019-01-23 MED ORDER — DIPHENHYDRAMINE HCL 25 MG PO CAPS
50.0000 mg | ORAL_CAPSULE | Freq: Once | ORAL | Status: AC
  Administered 2019-01-23: 50 mg via ORAL

## 2019-01-23 MED ORDER — PALONOSETRON HCL INJECTION 0.25 MG/5ML
INTRAVENOUS | Status: AC
  Filled 2019-01-23: qty 5

## 2019-01-23 MED ORDER — SODIUM CHLORIDE 0.9 % IV SOLN
75.0000 mg/m2 | Freq: Once | INTRAVENOUS | Status: AC
  Administered 2019-01-23: 120 mg via INTRAVENOUS
  Filled 2019-01-23: qty 12

## 2019-01-23 NOTE — Telephone Encounter (Signed)
ERROR

## 2019-01-23 NOTE — Patient Outreach (Signed)
[facility]) Care Management  01/23/2019  Debbie Watts January 01, 1961 [id number]   EMMI-general discharge from [place] Day # 1 Date: Monday 01/22/19 1323  Red Alert Reason: Who reached Patient Got discharge papers? No Scheduled follow-up? No  Insurance: [employer] medicare  [facility] admissions x  1 ED visits x 1 in the last 6 months  Last hospital admission 01/18/19 to 01/20/19 for syncope r/t diarrhea from chemo, initial constipation, nausea, headaches (Migraines) , back pain  Outreach attempt # 1 Patient is able to verify HIPAA [place] Management RN reviewed and addressed red alert with patient   EMMI: Debbie Watts reports that the documented EMMI responses for 01/22/19 were all incorrect She confirms she has her discharge instructions and understands them. She denies having questions about the discharge instructions. She confirms she does have a follow up with the office of Debbie Watts She was already seen by Dr Lindi Adie oncology   When CM asked about other possible transition of care concerns she denies issues She voices appreciation for the contact call from [facility] RN CM    Social: Debbie Watts is single She has support of a significant other. She is independent with her care and denies transportation concerns  Conditions: Malignant neoplasm of upper outer quadrant of right breast on chemo with TCHP, HTN, migraine headache, syncope, COPD, nausea, vomiting and diarrhea, Bilateral hearing loss, multiple sclerosis, Lumbar and cervical herniated discs, chronic recurrent major depression, leukocytosis, smoker, alcohol and marijuana use    JSE:GBTD   Medications: She denies concerns with taking medications as prescribed, affording medications, side effects of medications and questions about medications   Advance Directives: She denies need for assist with advance directives   Consent: [facility] RN CM reviewed [facility] services with patient. Patient gave verbal consent for  services [facility] telephonic RN CM.   Advised patient that there will be further automated EMMI- post discharge calls to assess how the patient is doing following the recent hospitalization Advised the patient that another call may be received from a nurse if any of their responses were abnormal. Patient voiced understanding and was appreciative of f/u call.   Plan: [facility] RN CM will close case at this time as patient has been assessed and no needs identified/needs resolved.   Pt encouraged to return a call to [place] CM prn  [facility] RN CM sent a successful outreach letter as discussed with [facility] brochure enclosed for review  Debbie Watts L. Lavina Hamman, RN, BSN, [place] Coordinator Office number [phone] Mobile number [phone]  Main [facility] number [phone] Fax number [phone]

## 2019-01-23 NOTE — Patient Instructions (Signed)
[facility] Discharge Instructions for Patients Receiving Chemotherapy  Today you received the following chemotherapy agents: Herceptin, Perjeta, Taxotere, Carboplatin.  To help prevent nausea and vomiting after your treatment, we encourage you to take your nausea medication as directed.   If you develop nausea and vomiting that is not controlled by your nausea medication, call the clinic.   BELOW ARE SYMPTOMS THAT SHOULD BE REPORTED IMMEDIATELY:  *FEVER GREATER THAN 100.5 F  *CHILLS WITH OR WITHOUT FEVER  NAUSEA AND VOMITING THAT IS NOT CONTROLLED WITH YOUR NAUSEA MEDICATION  *UNUSUAL SHORTNESS OF BREATH  *UNUSUAL BRUISING OR BLEEDING  TENDERNESS IN MOUTH AND THROAT WITH OR WITHOUT PRESENCE OF ULCERS  *URINARY PROBLEMS  *BOWEL PROBLEMS  UNUSUAL RASH Items with * indicate a potential emergency and should be followed up as soon as possible.  Feel free to call the clinic should you have any questions or concerns. The clinic phone number is [phone].  Please show the CHEMO ALERT CARD at check-in to the Emergency Department and triage nurse.   

## 2019-01-25 ENCOUNTER — Ambulatory Visit: Payer: Medicare HMO

## 2019-01-26 ENCOUNTER — Inpatient Hospital Stay: Payer: Medicare HMO | Attending: Hematology and Oncology

## 2019-01-26 ENCOUNTER — Telehealth: Payer: Self-pay | Admitting: *Deleted

## 2019-01-26 ENCOUNTER — Other Ambulatory Visit: Payer: Self-pay

## 2019-01-26 VITALS — BP 137/82 | HR 89 | Temp 98.5°F | Resp 18

## 2019-01-26 DIAGNOSIS — C50411 Malignant neoplasm of upper-outer quadrant of right female breast: Secondary | ICD-10-CM | POA: Diagnosis not present

## 2019-01-26 DIAGNOSIS — Z170 Estrogen receptor positive status [ER+]: Secondary | ICD-10-CM

## 2019-01-26 DIAGNOSIS — Z5112 Encounter for antineoplastic immunotherapy: Secondary | ICD-10-CM | POA: Diagnosis not present

## 2019-01-26 DIAGNOSIS — Z5189 Encounter for other specified aftercare: Secondary | ICD-10-CM | POA: Insufficient documentation

## 2019-01-26 DIAGNOSIS — Z5111 Encounter for antineoplastic chemotherapy: Secondary | ICD-10-CM | POA: Insufficient documentation

## 2019-01-26 MED ORDER — PEGFILGRASTIM-CBQV 6 MG/0.6ML ~~LOC~~ SOSY
PREFILLED_SYRINGE | SUBCUTANEOUS | Status: AC
  Filled 2019-01-26: qty 0.6

## 2019-01-26 MED ORDER — PEGFILGRASTIM-CBQV 6 MG/0.6ML ~~LOC~~ SOSY
6.0000 mg | PREFILLED_SYRINGE | Freq: Once | SUBCUTANEOUS | Status: AC
  Administered 2019-01-26: 6 mg via SUBCUTANEOUS

## 2019-01-26 NOTE — Patient Instructions (Signed)
Pegfilgrastim injection  What is this medicine?  PEGFILGRASTIM (PEG fil gra stim) is a long-acting granulocyte colony-stimulating factor that stimulates the growth of neutrophils, a type of white blood cell important in the body's fight against infection. It is used to reduce the incidence of fever and infection in patients with certain types of cancer who are receiving chemotherapy that affects the bone marrow, and to increase survival after being exposed to high doses of radiation.  This medicine may be used for other purposes; ask your health care provider or pharmacist if you have questions.  COMMON BRAND NAME(S): Fulphila, Neulasta, UDENYCA  What should I tell my health care provider before I take this medicine?  They need to know if you have any of these conditions:  -kidney disease  -latex allergy  -ongoing radiation therapy  -sickle cell disease  -skin reactions to acrylic adhesives (On-Body Injector only)  -an unusual or allergic reaction to pegfilgrastim, filgrastim, other medicines, foods, dyes, or preservatives  -pregnant or trying to get pregnant  -breast-feeding  How should I use this medicine?  This medicine is for injection under the skin. If you get this medicine at home, you will be taught how to prepare and give the pre-filled syringe or how to use the On-body Injector. Refer to the patient Instructions for Use for detailed instructions. Use exactly as directed. Tell your healthcare provider immediately if you suspect that the On-body Injector may not have performed as intended or if you suspect the use of the On-body Injector resulted in a missed or partial dose.  It is important that you put your used needles and syringes in a special sharps container. Do not put them in a trash can. If you do not have a sharps container, call your pharmacist or healthcare provider to get one.  Talk to your pediatrician regarding the use of this medicine in children. While this drug may be prescribed for  selected conditions, precautions do apply.  Overdosage: If you think you have taken too much of this medicine contact a poison control center or emergency room at once.  NOTE: This medicine is only for you. Do not share this medicine with others.  What if I miss a dose?  It is important not to miss your dose. Call your doctor or health care professional if you miss your dose. If you miss a dose due to an On-body Injector failure or leakage, a new dose should be administered as soon as possible using a single prefilled syringe for manual use.  What may interact with this medicine?  Interactions have not been studied.  Give your health care provider a list of all the medicines, herbs, non-prescription drugs, or dietary supplements you use. Also tell them if you smoke, drink alcohol, or use illegal drugs. Some items may interact with your medicine.  This list may not describe all possible interactions. Give your health care provider a list of all the medicines, herbs, non-prescription drugs, or dietary supplements you use. Also tell them if you smoke, drink alcohol, or use illegal drugs. Some items may interact with your medicine.  What should I watch for while using this medicine?  You may need blood work done while you are taking this medicine.  If you are going to need a MRI, CT scan, or other procedure, tell your doctor that you are using this medicine (On-Body Injector only).  What side effects may I notice from receiving this medicine?  Side effects that you should report to   your doctor or health care professional as soon as possible:  -allergic reactions like skin rash, itching or hives, swelling of the face, lips, or tongue  -back pain  -dizziness  -fever  -pain, redness, or irritation at site where injected  -pinpoint red spots on the skin  -red or dark-brown urine  -shortness of breath or breathing problems  -stomach or side pain, or pain at the shoulder  -swelling  -tiredness  -trouble passing urine or  change in the amount of urine  Side effects that usually do not require medical attention (report to your doctor or health care professional if they continue or are bothersome):  -bone pain  -muscle pain  This list may not describe all possible side effects. Call your doctor for medical advice about side effects. You may report side effects to FDA at 1-800-FDA-1088.  Where should I keep my medicine?  Keep out of the reach of children.  If you are using this medicine at home, you will be instructed on how to store it. Throw away any unused medicine after the expiration date on the label.  NOTE: This sheet is a summary. It may not cover all possible information. If you have questions about this medicine, talk to your doctor, pharmacist, or health care provider.   2019 Elsevier/Gold Standard (2018-01-16 16:57:08)

## 2019-01-26 NOTE — Telephone Encounter (Signed)
Returned missed call from pt stating she missed her injection apt yesterday 01/25/2019 and would like to reschedule to have her injection today.  High priority message sent to scheduling to schedule injection apt today for pt and to call pt to confirm time.

## 2019-02-01 ENCOUNTER — Telehealth: Payer: Self-pay | Admitting: *Deleted

## 2019-02-01 ENCOUNTER — Ambulatory Visit: Payer: Medicare HMO

## 2019-02-01 ENCOUNTER — Encounter: Payer: Self-pay | Admitting: General Practice

## 2019-02-01 NOTE — Telephone Encounter (Signed)
Received call from pt stating that she has been experiencing cramping in her hands and feet.  Reviewed pts last labs and her potassium was WNL.  Instructed pt she needs to increase her water intake and to drink Gatorade as well to help replenish electrolytes.  I also instructed pt that other patients have stated that drinking some pickle juice or mustard has really helped with cramping as well.  Instructed pt to call if she has any other issues. Pt verbalized understanding.

## 2019-02-01 NOTE — Assessment & Plan Note (Signed)
11/21/2018:Screening detected right breast mass upper outer quadrant, in addition to areas of calcifications which were not biopsied. By ultrasound she had 2 breast masses 12 o'clock position 2.9 cm: Grade 2 IDC with high-grade DCIS ER 100%, PR 70%, Ki-67 10%, HER-2 3+ by IHC and 11 o'clock position 1 cm, ER 90%, PR 50%, Ki-67 15%, HER-2 3+ by IHC, T2N0 stage Ib  Recommendation: 1. Neoadjuvant chemotherapy with TCH Perjeta 6 cycles followed by Herceptinand Perjeta versus Kadcylamaintenance for 1 year 2. Followed bymastectomy versusbreast conserving surgery with sentinel lymph node study 3. Followed by adjuvant radiation therapy if patient had lumpectomy 4.Followed by adjuvant antiestrogen therapy  Breast MRI 12/03/2018: 2.3 cm enhancing mass 4 o'clock position right breast, 11 o'clock position biopsy-proven malignancy area did not show any discrete mass -------------------------------------------------------------------------------------------------------------------------------------------------------- Current treatment: tomorrow is Cycle 4 day1TCH Perjetastarted2/19/2020 Labs reviewed Echocardiogram: 12/06/2018: EF greater than 65%  Chemo toxicities: 1.Bone pain especially low back pain as result of Neulasta: I discussed with her about Claritin with each treatment 2.Diarrhea: Well controlled with Imodium 3.Nausea: Moderate 4.Migraine headache: Improved with Excedrin 5.Fatigue due to chemotherapy 6. Syncope: 3/26-3/28: Syncope Hospitalization, encouraged her to increase fluid intake and not drink alcohol. 7. Monitoring her LFTs. Ok to treat with ALT 88  Return to clinic in3weeksforcycle 5

## 2019-02-01 NOTE — Progress Notes (Signed)
[place] Team contacted patient to assess for food insecurity and other psychosocial needs during current COVID19 pandemic.  Left message, no answer.  Beverely Pace, [place]

## 2019-02-09 ENCOUNTER — Other Ambulatory Visit: Payer: Self-pay | Admitting: Hematology and Oncology

## 2019-02-09 ENCOUNTER — Other Ambulatory Visit: Payer: Self-pay | Admitting: Neurology

## 2019-02-09 DIAGNOSIS — C50411 Malignant neoplasm of upper-outer quadrant of right female breast: Secondary | ICD-10-CM

## 2019-02-09 DIAGNOSIS — Z170 Estrogen receptor positive status [ER+]: Principal | ICD-10-CM

## 2019-02-12 ENCOUNTER — Other Ambulatory Visit: Payer: Medicare HMO

## 2019-02-12 ENCOUNTER — Ambulatory Visit: Payer: Medicare HMO | Admitting: Hematology and Oncology

## 2019-02-12 NOTE — Progress Notes (Signed)
Patient Care Team: Neva Seat, MD as PCP - General (Internal Medicine) Excell Seltzer, MD as Consulting Physician (General Surgery) Nicholas Lose, MD as Consulting Physician (Hematology and Oncology) Kyung Rudd, MD as Consulting Physician (Radiation Oncology)  DIAGNOSIS:    ICD-10-CM   1. Malignant neoplasm of upper-outer quadrant of right breast in female, estrogen receptor positive ([place]) C50.411    Z17.0     SUMMARY OF ONCOLOGIC HISTORY:   Malignant neoplasm of upper-outer quadrant of right breast in female, estrogen receptor positive ([place])   11/21/2018 Initial Diagnosis    Screening detected right breast mass upper outer quadrant, in addition to areas of calcifications which were not biopsied.  By ultrasound she had 2 breast masses 12 o'clock position 2.9 cm: Grade 2 IDC with high-grade DCIS ER 100%, PR 70%, Ki-67 10%, HER-2 3+ by IHC and 11 o'clock position 1 cm, ER 90%, PR 50%, Ki-67 15%, HER-2 3+ by IHC, T2N0 stage Ib    11/29/2018 Cancer Staging    Staging form: Breast, AJCC 8th Edition - Clinical stage from 11/29/2018: Stage IB (cT2, cN0, cM0, G3, ER+, PR+, HER2+) - Signed by Nicholas Lose, MD on 11/29/2018    12/12/2018 -  Neo-Adjuvant Chemotherapy    Neoadjuvant chemotherapy with [facility] Perjeta    01/18/2019 - 01/20/2019 Hospital Admission    Syncope     CHIEF COMPLIANT: Cycle 4 TCHP  INTERVAL HISTORY: Debbie Watts is a 58 y.o. with above-mentioned history of right breast cancer currently on neoadjuvant chemotherapy with TCHP. She presents to the clinic alone today for cycle 4.  She has been tolerating chemotherapy fairly well with exception of fatigue.  She denies any nausea or vomiting since she started taking antiemetics.  Imodium has been controlling her diarrhea very well.  She has somewhat of a poor appetite but is been trying to eat better.  REVIEW OF SYSTEMS:   Constitutional: Denies fevers, chills or abnormal weight loss Eyes: Denies blurriness of vision  Ears, nose, mouth, throat, and face: Denies mucositis or sore throat Respiratory: Denies cough, dyspnea or wheezes Cardiovascular: Denies palpitation, chest discomfort Gastrointestinal: Denies nausea, heartburn or change in bowel habits Skin: Denies abnormal skin rashes Lymphatics: Denies new lymphadenopathy or easy bruising Neurological: Denies numbness, tingling or new weaknesses Behavioral/Psych: Mood is stable, no new changes  Extremities: No lower extremity edema Breast: denies any pain or lumps or nodules in either breasts All other systems were reviewed with the patient and are negative.  I have reviewed the past medical history, past surgical history, social history and family history with the patient and they are unchanged from previous note.  ALLERGIES:  is allergic to procaine.  MEDICATIONS:  Current Outpatient Medications  Medication Sig Dispense Refill  . betamethasone valerate lotion (VALISONE) 0.1 % Apply 1 application topically daily.     . carbidopa-levodopa (SINEMET IR) 10-100 MG tablet TAKE 1 TABLET BY MOUTH AT BEDTIME. 90 tablet 1  . dexamethasone (DECADRON) 4 MG tablet TAKE 1 TABLET BY MOUTH DAILY. TAKE 1 TABLET DAY BEFORE CHEMO AND 1 TABLET DAY AFTER CHEMO WITH FOOD 12 tablet 0  . diclofenac (VOLTAREN) 75 MG EC tablet TAKE 1 TABLET BY MOUTH TWICE A DAY (Patient taking differently: Take 75 mg by mouth 2 (two) times daily. ) 60 tablet 5  . Dimethyl Fumarate 240 MG CPDR Take 1 capsule (240 mg total) by mouth 2 (two) times daily. 180 capsule 3  . eletriptan (RELPAX) 40 MG tablet Take 40 mg by mouth as needed  for migraine or headache. One tablet by mouth at onset of headache. May repeat in 2 hours if headache persists or recurs.    . fluconazole (DIFLUCAN) 100 MG tablet Take 1 tablet (100 mg total) by mouth daily. 30 tablet 0  . fluticasone (FLONASE) 50 MCG/ACT nasal spray Place 2 sprays into both nostrils at bedtime.    . gabapentin (NEURONTIN) 300 MG capsule TAKE 2  CAPSULES (600 MG TOTAL) BY MOUTH 2 (TWO) TIMES DAILY. 360 capsule 1  . lidocaine-prilocaine (EMLA) cream Apply to affected area once 30 g 3  . lisinopril (PRINIVIL,ZESTRIL) 5 MG tablet TAKE 1 TABLET BY MOUTH EVERY DAY 90 tablet 3  . LORazepam (ATIVAN) 0.5 MG tablet Take 1 tablet (0.5 mg total) by mouth at bedtime as needed (Nausea or vomiting). 30 tablet 0  . ondansetron (ZOFRAN) 8 MG tablet Take 1 tablet (8 mg total) by mouth 2 (two) times daily as needed (Nausea or vomiting). Begin 4 days after chemotherapy. 30 tablet 1  . oxyCODONE-acetaminophen (PERCOCET/ROXICET) 5-325 MG tablet Take 1 tablet by mouth every 8 (eight) hours as needed for severe pain. 30 tablet 0  . PARoxetine (PAXIL) 20 MG tablet Take 1 tablet (20 mg total) by mouth daily. 90 tablet 3  . prochlorperazine (COMPAZINE) 10 MG tablet Take 1 tablet (10 mg total) by mouth every 6 (six) hours as needed (Nausea or vomiting). 30 tablet 1  . traMADol (ULTRAM) 50 MG tablet TAKE 1 TABLET BY MOUTH TWICE A DAY AS NEEDED 60 tablet 3  . VENTOLIN HFA 108 (90 Base) MCG/ACT inhaler Inhale 1-2 puffs into the lungs every 6 (six) hours as needed for wheezing or shortness of breath. 1 Inhaler 3   No current facility-administered medications for this visit.     PHYSICAL EXAMINATION: ECOG PERFORMANCE STATUS: 1 - Symptomatic but completely ambulatory  Vitals:   02/13/19 0838  BP: 131/76  Pulse: 76  Resp: 18  Temp: 98 F (36.7 C)  SpO2: 100%   Filed Weights   02/13/19 0838  Weight: 138 lb 12.8 oz (63 kg)    GENERAL: alert, no distress and comfortable SKIN: skin color, texture, turgor are normal, no rashes or significant lesions EYES: normal, Conjunctiva are pink and non-injected, sclera clear OROPHARYNX: no exudate, no erythema and lips, buccal mucosa, and tongue normal  NECK: supple, thyroid normal size, non-tender, without nodularity LYMPH: no palpable lymphadenopathy in the cervical, axillary or inguinal LUNGS: clear to auscultation  and percussion with normal breathing effort HEART: regular rate & rhythm and no murmurs and no lower extremity edema ABDOMEN: abdomen soft, non-tender and normal bowel sounds MUSCULOSKELETAL: no cyanosis of digits and no clubbing  NEURO: alert & oriented x 3 with fluent speech, no focal motor/sensory deficits EXTREMITIES: No lower extremity edema  LABORATORY DATA:  I have reviewed the data as listed CMP Latest Ref Rng & Units 01/22/2019 01/19/2019 01/18/2019  Glucose 70 - 99 mg/dL 130(H) 121(H) 154(H)  BUN 6 - 20 mg/dL '20 13 15  ' Creatinine 0.44 - 1.00 mg/dL 0.79 0.63 0.65  Sodium 135 - 145 mmol/L 138 140 134(L)  Potassium 3.5 - 5.1 mmol/L 3.9 3.8 3.3(L)  Chloride 98 - 111 mmol/L 101 108 100  CO2 22 - 32 mmol/L '28 25 25  ' Calcium 8.9 - 10.3 mg/dL 8.7(L) 8.0(L) 8.2(L)  Total Protein 6.5 - 8.1 g/dL 6.2(L) - -  Total Bilirubin 0.3 - 1.2 mg/dL 0.3 - -  Alkaline Phos 38 - 126 U/L 96 - -  AST 15 -  41 U/L 34 - -  ALT 0 - 44 U/L 88(H) - -    Lab Results  Component Value Date   WBC 4.9 02/13/2019   HGB 10.3 (L) 02/13/2019   HCT 32.0 (L) 02/13/2019   MCV 100.0 02/13/2019   PLT 323 02/13/2019   NEUTROABS 3.2 02/13/2019    ASSESSMENT & PLAN:  Malignant neoplasm of upper-outer quadrant of right breast in female, estrogen receptor positive ([place]) 11/21/2018:Screening detected right breast mass upper outer quadrant, in addition to areas of calcifications which were not biopsied. By ultrasound she had 2 breast masses 12 o'clock position 2.9 cm: Grade 2 IDC with high-grade DCIS ER 100%, PR 70%, Ki-67 10%, HER-2 3+ by IHC and 11 o'clock position 1 cm, ER 90%, PR 50%, Ki-67 15%, HER-2 3+ by IHC, T2N0 stage Ib  Recommendation: 1. Neoadjuvant chemotherapy with TCH Perjeta 6 cycles followed by Herceptinand Perjeta versus Kadcylamaintenance for 1 year 2. Followed bymastectomy versusbreast conserving surgery with sentinel lymph node study 3. Followed by adjuvant radiation therapy if patient had  lumpectomy 4.Followed by adjuvant antiestrogen therapy  Breast MRI 12/03/2018: 2.3 cm enhancing mass 4 o'clock position right breast, 11 o'clock position biopsy-proven malignancy area did not show any discrete mass -------------------------------------------------------------------------------------------------------------------------------------------------------- Current treatment: Cycle 4 day1TCH Perjetastarted2/19/2020 Labs reviewed Echocardiogram: 12/06/2018: EF greater than 65%  Chemo toxicities: 1.Bone pain especially low back pain as result of Neulasta: Improved with Claritin 2.Diarrhea: Well controlled with Imodium 3.Nausea: Moderate 4.Migraine headache: Improved with Excedrin 5.Fatigue due to chemotherapy 6. Syncope: 3/26-3/28: Syncope Hospitalization, encouraged her to increase fluid intake and not drink alcohol. 7. Monitoring her LFTs. Ok to treat with ALT 88  Return to clinic in3weeksforcycle 5  No orders of the defined types were placed in this encounter.  The patient has a good understanding of the overall plan. she agrees with it. she will call with any problems that may develop before the next visit here.  Nicholas Lose, MD 02/13/2019  Julious Oka Dorshimer am acting as scribe for Dr. Nicholas Lose.  I have reviewed the above documentation for accuracy and completeness, and I agree with the above.

## 2019-02-12 NOTE — Telephone Encounter (Signed)
[place] Database Verified LR: 12/11/2018 Qty: 60 Pending appointment: 03/20/2019

## 2019-02-13 ENCOUNTER — Inpatient Hospital Stay: Payer: Medicare HMO

## 2019-02-13 ENCOUNTER — Other Ambulatory Visit: Payer: Self-pay

## 2019-02-13 ENCOUNTER — Other Ambulatory Visit: Payer: Self-pay | Admitting: Hematology and Oncology

## 2019-02-13 ENCOUNTER — Inpatient Hospital Stay (HOSPITAL_BASED_OUTPATIENT_CLINIC_OR_DEPARTMENT_OTHER): Payer: Medicare HMO | Admitting: Hematology and Oncology

## 2019-02-13 VITALS — BP 133/76 | HR 90 | Temp 98.1°F | Resp 18

## 2019-02-13 DIAGNOSIS — R530 Neoplastic (malignant) related fatigue: Secondary | ICD-10-CM

## 2019-02-13 DIAGNOSIS — C50411 Malignant neoplasm of upper-outer quadrant of right female breast: Secondary | ICD-10-CM

## 2019-02-13 DIAGNOSIS — Z170 Estrogen receptor positive status [ER+]: Secondary | ICD-10-CM

## 2019-02-13 DIAGNOSIS — Z5112 Encounter for antineoplastic immunotherapy: Secondary | ICD-10-CM | POA: Diagnosis not present

## 2019-02-13 DIAGNOSIS — R197 Diarrhea, unspecified: Secondary | ICD-10-CM | POA: Diagnosis not present

## 2019-02-13 DIAGNOSIS — R630 Anorexia: Secondary | ICD-10-CM

## 2019-02-13 DIAGNOSIS — Z5189 Encounter for other specified aftercare: Secondary | ICD-10-CM | POA: Diagnosis not present

## 2019-02-13 DIAGNOSIS — Z5111 Encounter for antineoplastic chemotherapy: Secondary | ICD-10-CM | POA: Diagnosis not present

## 2019-02-13 DIAGNOSIS — Z95828 Presence of other vascular implants and grafts: Secondary | ICD-10-CM | POA: Insufficient documentation

## 2019-02-13 LAB — CBC WITH DIFFERENTIAL (CANCER CENTER ONLY)
Abs Immature Granulocytes: 0.03 10*3/uL (ref 0.00–0.07)
Basophils Absolute: 0 10*3/uL (ref 0.0–0.1)
Basophils Relative: 1 %
Eosinophils Absolute: 0 10*3/uL (ref 0.0–0.5)
Eosinophils Relative: 1 %
HCT: 32 % — ABNORMAL LOW (ref 36.0–46.0)
Hemoglobin: 10.3 g/dL — ABNORMAL LOW (ref 12.0–15.0)
Immature Granulocytes: 1 %
Lymphocytes Relative: 20 %
Lymphs Abs: 1 10*3/uL (ref 0.7–4.0)
MCH: 32.2 pg (ref 26.0–34.0)
MCHC: 32.2 g/dL (ref 30.0–36.0)
MCV: 100 fL (ref 80.0–100.0)
Monocytes Absolute: 0.6 10*3/uL (ref 0.1–1.0)
Monocytes Relative: 12 %
Neutro Abs: 3.2 10*3/uL (ref 1.7–7.7)
Neutrophils Relative %: 65 %
Platelet Count: 323 10*3/uL (ref 150–400)
RBC: 3.2 MIL/uL — ABNORMAL LOW (ref 3.87–5.11)
RDW: 19.6 % — ABNORMAL HIGH (ref 11.5–15.5)
WBC Count: 4.9 10*3/uL (ref 4.0–10.5)
nRBC: 0 % (ref 0.0–0.2)

## 2019-02-13 LAB — CMP (CANCER CENTER ONLY)
ALT: 77 U/L — ABNORMAL HIGH (ref 0–44)
AST: 46 U/L — ABNORMAL HIGH (ref 15–41)
Albumin: 3.2 g/dL — ABNORMAL LOW (ref 3.5–5.0)
Alkaline Phosphatase: 84 U/L (ref 38–126)
Anion gap: 11 (ref 5–15)
BUN: 14 mg/dL (ref 6–20)
CO2: 26 mmol/L (ref 22–32)
Calcium: 7.9 mg/dL — ABNORMAL LOW (ref 8.9–10.3)
Chloride: 105 mmol/L (ref 98–111)
Creatinine: 0.69 mg/dL (ref 0.44–1.00)
GFR, Est AFR Am: >60 mL/min (ref >60)
GFR, Estimated: >60 mL/min (ref >60)
Glucose, Bld: 86 mg/dL (ref 70–99)
Potassium: 3.8 mmol/L (ref 3.5–5.1)
Sodium: 142 mmol/L (ref 135–145)
Total Bilirubin: <0.2 mg/dL — ABNORMAL LOW (ref 0.3–1.2)
Total Protein: 5.6 g/dL — ABNORMAL LOW (ref 6.5–8.1)

## 2019-02-13 MED ORDER — PALONOSETRON HCL INJECTION 0.25 MG/5ML
INTRAVENOUS | Status: AC
  Filled 2019-02-13: qty 5

## 2019-02-13 MED ORDER — DIPHENHYDRAMINE HCL 25 MG PO CAPS
50.0000 mg | ORAL_CAPSULE | Freq: Once | ORAL | Status: AC
  Administered 2019-02-13: 50 mg via ORAL

## 2019-02-13 MED ORDER — ACETAMINOPHEN 325 MG PO TABS
650.0000 mg | ORAL_TABLET | Freq: Once | ORAL | Status: AC
  Administered 2019-02-13: 10:00:00 650 mg via ORAL

## 2019-02-13 MED ORDER — SODIUM CHLORIDE 0.9 % IV SOLN
Freq: Once | INTRAVENOUS | Status: AC
  Administered 2019-02-13: 10:00:00 via INTRAVENOUS
  Filled 2019-02-13: qty 5

## 2019-02-13 MED ORDER — HEPARIN SOD (PORK) LOCK FLUSH 100 UNIT/ML IV SOLN
500.0000 [IU] | Freq: Once | INTRAVENOUS | Status: AC | PRN
  Administered 2019-02-13: 16:00:00 500 [IU]
  Filled 2019-02-13: qty 5

## 2019-02-13 MED ORDER — PALONOSETRON HCL INJECTION 0.25 MG/5ML
0.2500 mg | Freq: Once | INTRAVENOUS | Status: AC
  Administered 2019-02-13: 10:00:00 0.25 mg via INTRAVENOUS

## 2019-02-13 MED ORDER — OXYCODONE-ACETAMINOPHEN 5-325 MG PO TABS
ORAL_TABLET | ORAL | Status: AC
  Filled 2019-02-13: qty 1

## 2019-02-13 MED ORDER — OXYCODONE-ACETAMINOPHEN 5-325 MG PO TABS
1.0000 | ORAL_TABLET | Freq: Once | ORAL | Status: AC
  Administered 2019-02-13: 15:00:00 1 via ORAL

## 2019-02-13 MED ORDER — SODIUM CHLORIDE 0.9 % IV SOLN
420.0000 mg | Freq: Once | INTRAVENOUS | Status: AC
  Administered 2019-02-13: 12:00:00 420 mg via INTRAVENOUS
  Filled 2019-02-13: qty 14

## 2019-02-13 MED ORDER — DIPHENHYDRAMINE HCL 25 MG PO CAPS
ORAL_CAPSULE | ORAL | Status: AC
  Filled 2019-02-13: qty 2

## 2019-02-13 MED ORDER — SODIUM CHLORIDE 0.9% FLUSH
10.0000 mL | INTRAVENOUS | Status: DC | PRN
  Administered 2019-02-13: 16:00:00 10 mL
  Filled 2019-02-13: qty 10

## 2019-02-13 MED ORDER — SODIUM CHLORIDE 0.9 % IV SOLN
620.0000 mg | Freq: Once | INTRAVENOUS | Status: AC
  Administered 2019-02-13: 620 mg via INTRAVENOUS
  Filled 2019-02-13: qty 62

## 2019-02-13 MED ORDER — SODIUM CHLORIDE 0.9% FLUSH
10.0000 mL | Freq: Once | INTRAVENOUS | Status: AC
  Administered 2019-02-13: 08:00:00 10 mL
  Filled 2019-02-13: qty 10

## 2019-02-13 MED ORDER — SODIUM CHLORIDE 0.9 % IV SOLN
Freq: Once | INTRAVENOUS | Status: AC
  Administered 2019-02-13: 09:00:00 via INTRAVENOUS
  Filled 2019-02-13: qty 250

## 2019-02-13 MED ORDER — SODIUM CHLORIDE 0.9 % IV SOLN
75.0000 mg/m2 | Freq: Once | INTRAVENOUS | Status: AC
  Administered 2019-02-13: 14:00:00 120 mg via INTRAVENOUS
  Filled 2019-02-13: qty 12

## 2019-02-13 MED ORDER — TRASTUZUMAB CHEMO 150 MG IV SOLR
6.0000 mg/kg | Freq: Once | INTRAVENOUS | Status: AC
  Administered 2019-02-13: 11:00:00 378 mg via INTRAVENOUS
  Filled 2019-02-13: qty 18

## 2019-02-13 MED ORDER — ACETAMINOPHEN 325 MG PO TABS
ORAL_TABLET | ORAL | Status: AC
  Filled 2019-02-13: qty 2

## 2019-02-13 NOTE — Patient Instructions (Signed)
[facility] Discharge Instructions for Patients Receiving Chemotherapy  Today you received the following chemotherapy agents: Herceptin, Perjeta, Taxotere, Carboplatin.  To help prevent nausea and vomiting after your treatment, we encourage you to take your nausea medication as directed.   If you develop nausea and vomiting that is not controlled by your nausea medication, call the clinic.   BELOW ARE SYMPTOMS THAT SHOULD BE REPORTED IMMEDIATELY:  *FEVER GREATER THAN 100.5 F  *CHILLS WITH OR WITHOUT FEVER  NAUSEA AND VOMITING THAT IS NOT CONTROLLED WITH YOUR NAUSEA MEDICATION  *UNUSUAL SHORTNESS OF BREATH  *UNUSUAL BRUISING OR BLEEDING  TENDERNESS IN MOUTH AND THROAT WITH OR WITHOUT PRESENCE OF ULCERS  *URINARY PROBLEMS  *BOWEL PROBLEMS  UNUSUAL RASH Items with * indicate a potential emergency and should be followed up as soon as possible.  Feel free to call the clinic should you have any questions or concerns. The clinic phone number is [phone].  Please show the CHEMO ALERT CARD at check-in to the Emergency Department and triage nurse.   

## 2019-02-13 NOTE — Progress Notes (Signed)
Spoke with MD regarding 01/18/19 hcg blood results from ED visit for syncope, dizziness. Unrelated - ok to proceed with treatment.   Hardie Pulley, PharmD, BCPS, BCOP

## 2019-02-13 NOTE — Progress Notes (Signed)
Pt ok with switch to Onpro. Attempted to obtain auth for Onpro through [employer]. This process may take 2 days. Pt will proceed with Udenyca this cycle. Will attempt to have Onpro approved for her next treatment. Pt aware to return on 4/22 for Udenyca.  Hardie Pulley, PharmD, BCPS, BCOP

## 2019-02-14 ENCOUNTER — Inpatient Hospital Stay: Payer: Medicare HMO

## 2019-02-14 ENCOUNTER — Other Ambulatory Visit: Payer: Self-pay

## 2019-02-14 DIAGNOSIS — Z5112 Encounter for antineoplastic immunotherapy: Secondary | ICD-10-CM | POA: Diagnosis not present

## 2019-02-14 DIAGNOSIS — C50411 Malignant neoplasm of upper-outer quadrant of right female breast: Secondary | ICD-10-CM | POA: Diagnosis not present

## 2019-02-14 DIAGNOSIS — Z170 Estrogen receptor positive status [ER+]: Principal | ICD-10-CM

## 2019-02-14 DIAGNOSIS — Z5111 Encounter for antineoplastic chemotherapy: Secondary | ICD-10-CM | POA: Diagnosis not present

## 2019-02-14 DIAGNOSIS — Z5189 Encounter for other specified aftercare: Secondary | ICD-10-CM | POA: Diagnosis not present

## 2019-02-14 MED ORDER — PEGFILGRASTIM-CBQV 6 MG/0.6ML ~~LOC~~ SOSY
6.0000 mg | PREFILLED_SYRINGE | Freq: Once | SUBCUTANEOUS | Status: AC
  Administered 2019-02-14: 16:00:00 6 mg via SUBCUTANEOUS

## 2019-02-14 NOTE — Patient Instructions (Signed)
Pegfilgrastim injection  What is this medicine?  PEGFILGRASTIM (PEG fil gra stim) is a long-acting granulocyte colony-stimulating factor that stimulates the growth of neutrophils, a type of white blood cell important in the body's fight against infection. It is used to reduce the incidence of fever and infection in patients with certain types of cancer who are receiving chemotherapy that affects the bone marrow, and to increase survival after being exposed to high doses of radiation.  This medicine may be used for other purposes; ask your health care provider or pharmacist if you have questions.  COMMON BRAND NAME(S): Fulphila, Neulasta, UDENYCA  What should I tell my health care provider before I take this medicine?  They need to know if you have any of these conditions:  -kidney disease  -latex allergy  -ongoing radiation therapy  -sickle cell disease  -skin reactions to acrylic adhesives (On-Body Injector only)  -an unusual or allergic reaction to pegfilgrastim, filgrastim, other medicines, foods, dyes, or preservatives  -pregnant or trying to get pregnant  -breast-feeding  How should I use this medicine?  This medicine is for injection under the skin. If you get this medicine at home, you will be taught how to prepare and give the pre-filled syringe or how to use the On-body Injector. Refer to the patient Instructions for Use for detailed instructions. Use exactly as directed. Tell your healthcare provider immediately if you suspect that the On-body Injector may not have performed as intended or if you suspect the use of the On-body Injector resulted in a missed or partial dose.  It is important that you put your used needles and syringes in a special sharps container. Do not put them in a trash can. If you do not have a sharps container, call your pharmacist or healthcare provider to get one.  Talk to your pediatrician regarding the use of this medicine in children. While this drug may be prescribed for  selected conditions, precautions do apply.  Overdosage: If you think you have taken too much of this medicine contact a poison control center or emergency room at once.  NOTE: This medicine is only for you. Do not share this medicine with others.  What if I miss a dose?  It is important not to miss your dose. Call your doctor or health care professional if you miss your dose. If you miss a dose due to an On-body Injector failure or leakage, a new dose should be administered as soon as possible using a single prefilled syringe for manual use.  What may interact with this medicine?  Interactions have not been studied.  Give your health care provider a list of all the medicines, herbs, non-prescription drugs, or dietary supplements you use. Also tell them if you smoke, drink alcohol, or use illegal drugs. Some items may interact with your medicine.  This list may not describe all possible interactions. Give your health care provider a list of all the medicines, herbs, non-prescription drugs, or dietary supplements you use. Also tell them if you smoke, drink alcohol, or use illegal drugs. Some items may interact with your medicine.  What should I watch for while using this medicine?  You may need blood work done while you are taking this medicine.  If you are going to need a MRI, CT scan, or other procedure, tell your doctor that you are using this medicine (On-Body Injector only).  What side effects may I notice from receiving this medicine?  Side effects that you should report to   your doctor or health care professional as soon as possible:  -allergic reactions like skin rash, itching or hives, swelling of the face, lips, or tongue  -back pain  -dizziness  -fever  -pain, redness, or irritation at site where injected  -pinpoint red spots on the skin  -red or dark-brown urine  -shortness of breath or breathing problems  -stomach or side pain, or pain at the shoulder  -swelling  -tiredness  -trouble passing urine or  change in the amount of urine  Side effects that usually do not require medical attention (report to your doctor or health care professional if they continue or are bothersome):  -bone pain  -muscle pain  This list may not describe all possible side effects. Call your doctor for medical advice about side effects. You may report side effects to FDA at 1-800-FDA-1088.  Where should I keep my medicine?  Keep out of the reach of children.  If you are using this medicine at home, you will be instructed on how to store it. Throw away any unused medicine after the expiration date on the label.  NOTE: This sheet is a summary. It may not cover all possible information. If you have questions about this medicine, talk to your doctor, pharmacist, or health care provider.   2019 Elsevier/Gold Standard (2018-01-16 16:57:08)

## 2019-02-15 ENCOUNTER — Telehealth: Payer: Self-pay | Admitting: *Deleted

## 2019-02-15 NOTE — Telephone Encounter (Signed)
Neulasta Onpro Patient Outreach Note  Patient was contacted on 02/14/2019 in regards to switching G-CSF therapy to Neulasta Onpro ([email or web address]). Patient was educated on the purpose of this proposed change in therapy due to COVID-19 pandemic. Patient was educated about Neulasta Onpro on-body injector and patient will be provided with an educational video while in infusion on their next scheduled date if changed to Neulasta Onpro.   [x]  Patient agrees to change in therapy. Begin process to change to Neulasta Onpro therapy.  []  Patient does not agree to change in therapy. No change to Neulasta Onpro at this time.    Thank You,  Caprice Renshaw  02/15/2019 2:30 PM

## 2019-02-19 ENCOUNTER — Encounter: Payer: Self-pay | Admitting: Pharmacy Technician

## 2019-02-19 NOTE — Progress Notes (Signed)
The patient is approved for drug assistance by [place] for Herceptin and Perjeta. Enrollment is effective until 02/16/20 and is based on excessive OOP. Drug replacement will begin for DOS 12/13/18.

## 2019-02-21 ENCOUNTER — Other Ambulatory Visit: Payer: Self-pay | Admitting: Hematology and Oncology

## 2019-02-21 DIAGNOSIS — Z170 Estrogen receptor positive status [ER+]: Principal | ICD-10-CM

## 2019-02-21 DIAGNOSIS — C50411 Malignant neoplasm of upper-outer quadrant of right female breast: Secondary | ICD-10-CM

## 2019-02-26 ENCOUNTER — Other Ambulatory Visit: Payer: Self-pay | Admitting: Hematology and Oncology

## 2019-02-26 MED ORDER — OXYCODONE-ACETAMINOPHEN 5-325 MG PO TABS: 1.0000 | ORAL_TABLET | Freq: Three times a day (TID) | ORAL | 0 refills | Status: DC | PRN

## 2019-03-02 NOTE — Assessment & Plan Note (Signed)
11/21/2018:Screening detected right breast mass upper outer quadrant, in addition to areas of calcifications which were not biopsied. By ultrasound she had 2 breast masses 12 o'clock position 2.9 cm: Grade 2 IDC with high-grade DCIS ER 100%, PR 70%, Ki-67 10%, HER-2 3+ by IHC and 11 o'clock position 1 cm, ER 90%, PR 50%, Ki-67 15%, HER-2 3+ by IHC, T2N0 stage Ib  Recommendation: 1. Neoadjuvant chemotherapy with TCH Perjeta 6 cycles followed by Herceptinand Perjeta versus Kadcylamaintenance for 1 year 2. Followed bymastectomy versusbreast conserving surgery with sentinel lymph node study 3. Followed by adjuvant radiation therapy if patient had lumpectomy 4.Followed by adjuvant antiestrogen therapy  Breast MRI 12/03/2018: 2.3 cm enhancing mass 4 o'clock position right breast, 11 o'clock position biopsy-proven malignancy area did not show any discrete mass -------------------------------------------------------------------------------------------------------------------------------------------------------- Current treatment: [place] Perjetastarted2/19/2020 Labs reviewed Echocardiogram: 12/06/2018: EF greater than 65%  Chemo toxicities: 1.Bone pain especially low back pain as result of Neulasta: Improved with Claritin 2.Diarrhea: Well controlled with Imodium 3.Nausea: Moderate 4.Migraine headache: Improved with Excedrin 5.Fatigue due to chemotherapy 6. Syncope: 3/26-3/28: Syncope Hospitalization, encouraged her to increase fluid intake and not drink alcohol. 7. Monitoring her LFTs. Ok to treat with ALT 88  Return to clinic [id number]

## 2019-03-05 ENCOUNTER — Other Ambulatory Visit: Payer: Medicare HMO

## 2019-03-05 ENCOUNTER — Ambulatory Visit: Payer: Medicare HMO | Admitting: Hematology and Oncology

## 2019-03-05 NOTE — Progress Notes (Signed)
Patient Care Team: Neva Seat, MD as PCP - General (Internal Medicine) Excell Seltzer, MD as Consulting Physician (General Surgery) Nicholas Lose, MD as Consulting Physician (Hematology and Oncology) Kyung Rudd, MD as Consulting Physician (Radiation Oncology)  DIAGNOSIS:    ICD-10-CM   1. Malignant neoplasm of upper-outer quadrant of right breast in female, estrogen receptor positive ([place]) C50.411    Z17.0     SUMMARY OF ONCOLOGIC HISTORY:   Malignant neoplasm of upper-outer quadrant of right breast in female, estrogen receptor positive ([place])   11/21/2018 Initial Diagnosis    Screening detected right breast mass upper outer quadrant, in addition to areas of calcifications which were not biopsied.  By ultrasound she had 2 breast masses 12 o'clock position 2.9 cm: Grade 2 IDC with high-grade DCIS ER 100%, PR 70%, Ki-67 10%, HER-2 3+ by IHC and 11 o'clock position 1 cm, ER 90%, PR 50%, Ki-67 15%, HER-2 3+ by IHC, T2N0 stage Ib    11/29/2018 Cancer Staging    Staging form: Breast, AJCC 8th Edition - Clinical stage from 11/29/2018: Stage IB (cT2, cN0, cM0, G3, ER+, PR+, HER2+) - Signed by Nicholas Lose, MD on 11/29/2018    12/12/2018 -  Neo-Adjuvant Chemotherapy    Neoadjuvant chemotherapy with [facility] Perjeta    01/18/2019 - 01/20/2019 Hospital Admission    Syncope     CHIEF COMPLIANT: Cycle 5 TCHP  INTERVAL HISTORY: Debbie Watts is a 58 y.o. with above-mentioned history of right breast cancer currently on neoadjuvant chemotherapy with TCHP.She presents to the clinicalonetoday for cycle 5.  She continues to have nausea and diarrhea and fatigue.  REVIEW OF SYSTEMS:   Constitutional: Denies fevers, chills or abnormal weight loss Eyes: Denies blurriness of vision Ears, nose, mouth, throat, and face: Denies mucositis or sore throat Respiratory: Denies cough, dyspnea or wheezes Cardiovascular: Denies palpitation, chest discomfort Gastrointestinal: Nausea and diarrhea Skin:  Denies abnormal skin rashes Lymphatics: Denies new lymphadenopathy or easy bruising Neurological: Denies numbness, tingling or new weaknesses Behavioral/Psych: Mood is stable, no new changes  Extremities: No lower extremity edema Breast: denies any pain or lumps or nodules in either breasts All other systems were reviewed with the patient and are negative.  I have reviewed the past medical history, past surgical history, social history and family history with the patient and they are unchanged from previous note.  ALLERGIES:  is allergic to procaine.  MEDICATIONS:  Current Outpatient Medications  Medication Sig Dispense Refill  . betamethasone valerate lotion (VALISONE) 0.1 % Apply 1 application topically daily.     . carbidopa-levodopa (SINEMET IR) 10-100 MG tablet TAKE 1 TABLET BY MOUTH AT BEDTIME. 90 tablet 1  . dexamethasone (DECADRON) 4 MG tablet TAKE 1 TABLET BY MOUTH DAILY. TAKE 1 TABLET DAY BEFORE CHEMO AND 1 TABLET DAY AFTER CHEMO WITH FOOD 12 tablet 0  . diclofenac (VOLTAREN) 75 MG EC tablet TAKE 1 TABLET BY MOUTH TWICE A DAY (Patient taking differently: Take 75 mg by mouth 2 (two) times daily. ) 60 tablet 5  . Dimethyl Fumarate 240 MG CPDR Take 1 capsule (240 mg total) by mouth 2 (two) times daily. 180 capsule 3  . eletriptan (RELPAX) 40 MG tablet Take 40 mg by mouth as needed for migraine or headache. One tablet by mouth at onset of headache. May repeat in 2 hours if headache persists or recurs.    . fluconazole (DIFLUCAN) 100 MG tablet TAKE 1 TABLET BY MOUTH EVERY DAY 30 tablet 0  . fluticasone (FLONASE) 50  MCG/ACT nasal spray Place 2 sprays into both nostrils at bedtime.    . gabapentin (NEURONTIN) 300 MG capsule TAKE 2 CAPSULES (600 MG TOTAL) BY MOUTH 2 (TWO) TIMES DAILY. 360 capsule 1  . lidocaine-prilocaine (EMLA) cream Apply to affected area once 30 g 3  . lisinopril (PRINIVIL,ZESTRIL) 5 MG tablet TAKE 1 TABLET BY MOUTH EVERY DAY 90 tablet 3  . LORazepam (ATIVAN) 0.5 MG  tablet Take 1 tablet (0.5 mg total) by mouth at bedtime as needed (Nausea or vomiting). 30 tablet 0  . ondansetron (ZOFRAN) 8 MG tablet Take 1 tablet (8 mg total) by mouth 2 (two) times daily as needed (Nausea or vomiting). Begin 4 days after chemotherapy. 30 tablet 1  . oxyCODONE-acetaminophen (PERCOCET/ROXICET) 5-325 MG tablet Take 1 tablet by mouth every 8 (eight) hours as needed for severe pain. 30 tablet 0  . PARoxetine (PAXIL) 20 MG tablet Take 1 tablet (20 mg total) by mouth daily. 90 tablet 3  . prochlorperazine (COMPAZINE) 10 MG tablet TAKE 1 TABLET (10 MG TOTAL) BY MOUTH EVERY 6 (SIX) HOURS AS NEEDED (NAUSEA OR VOMITING). 30 tablet 1  . traMADol (ULTRAM) 50 MG tablet TAKE 1 TABLET BY MOUTH TWICE A DAY AS NEEDED 60 tablet 3  . VENTOLIN HFA 108 (90 Base) MCG/ACT inhaler Inhale 1-2 puffs into the lungs every 6 (six) hours as needed for wheezing or shortness of breath. 1 Inhaler 3   No current facility-administered medications for this visit.    Facility-Administered Medications Ordered in Other Visits  Medication Dose Route Frequency Provider Last Rate Last Dose  . sodium chloride flush (NS) 0.9 % injection 10 mL  10 mL Intracatheter Once Nicholas Lose, MD        PHYSICAL EXAMINATION: ECOG PERFORMANCE STATUS: 1 - Symptomatic but completely ambulatory  There were no vitals filed for this visit. There were no vitals filed for this visit.  GENERAL: alert, no distress and comfortable SKIN: skin color, texture, turgor are normal, no rashes or significant lesions EYES: normal, Conjunctiva are pink and non-injected, sclera clear OROPHARYNX: no exudate, no erythema and lips, buccal mucosa, and tongue normal  NECK: supple, thyroid normal size, non-tender, without nodularity LYMPH: no palpable lymphadenopathy in the cervical, axillary or inguinal LUNGS: clear to auscultation and percussion with normal breathing effort HEART: regular rate & rhythm and no murmurs and no lower extremity edema  ABDOMEN: abdomen soft, non-tender and normal bowel sounds MUSCULOSKELETAL: no cyanosis of digits and no clubbing  NEURO: alert & oriented x 3 with fluent speech, no focal motor/sensory deficits EXTREMITIES: No lower extremity edema  LABORATORY DATA:  I have reviewed the data as listed CMP Latest Ref Rng & Units 02/13/2019 01/22/2019 01/19/2019  Glucose 70 - 99 mg/dL 86 130(H) 121(H)  BUN 6 - 20 mg/dL '14 20 13  ' Creatinine 0.44 - 1.00 mg/dL 0.69 0.79 0.63  Sodium 135 - 145 mmol/L 142 138 140  Potassium 3.5 - 5.1 mmol/L 3.8 3.9 3.8  Chloride 98 - 111 mmol/L 105 101 108  CO2 22 - 32 mmol/L '26 28 25  ' Calcium 8.9 - 10.3 mg/dL 7.9(L) 8.7(L) 8.0(L)  Total Protein 6.5 - 8.1 g/dL 5.6(L) 6.2(L) -  Total Bilirubin 0.3 - 1.2 mg/dL <0.2(L) 0.3 -  Alkaline Phos 38 - 126 U/L 84 96 -  AST 15 - 41 U/L 46(H) 34 -  ALT 0 - 44 U/L 77(H) 88(H) -    Lab Results  Component Value Date   WBC 4.9 02/13/2019   HGB  10.3 (L) 02/13/2019   HCT 32.0 (L) 02/13/2019   MCV 100.0 02/13/2019   PLT 323 02/13/2019   NEUTROABS 3.2 02/13/2019    ASSESSMENT & PLAN:  Malignant neoplasm of upper-outer quadrant of right breast in female, estrogen receptor positive ([place]) 11/21/2018:Screening detected right breast mass upper outer quadrant, in addition to areas of calcifications which were not biopsied. By ultrasound she had 2 breast masses 12 o'clock position 2.9 cm: Grade 2 IDC with high-grade DCIS ER 100%, PR 70%, Ki-67 10%, HER-2 3+ by IHC and 11 o'clock position 1 cm, ER 90%, PR 50%, Ki-67 15%, HER-2 3+ by IHC, T2N0 stage Ib  Recommendation: 1. Neoadjuvant chemotherapy with TCH Perjeta 6 cycles followed by Herceptinand Perjeta versus Kadcylamaintenance for 1 year 2. Followed bymastectomy versusbreast conserving surgery with sentinel lymph node study 3. Followed by adjuvant radiation therapy if patient had lumpectomy 4.Followed by adjuvant antiestrogen therapy  Breast MRI 12/03/2018: 2.3 cm enhancing mass 4  o'clock position right breast, 11 o'clock position biopsy-proven malignancy area did not show any discrete mass -------------------------------------------------------------------------------------------------------------------------------------------------------- Current treatment: [place] Perjetastarted2/19/2020 Labs reviewed Echocardiogram: 12/06/2018: EF greater than 65%  Chemo toxicities: 1.Bone pain especially low back pain as result of Neulasta: Improved with Claritin 2.Diarrhea: Well controlled with Imodium 3.Nausea: Moderate 4.Migraine headache: Improved with Excedrin 5.Fatigue due to chemotherapy 6. Syncope: 3/26-3/28: Syncope Hospitalization, encouraged her to increase fluid intake and not drink alcohol. 7. Monitoring her LFTs. Ok to treat with ALT 88  Return to clinic [id number] I ordered a breast MRI. I sent a message to Dr. Excell Seltzer to see her after the breast MRI. She will be presented in the breast tumor board for discussion.   No orders of the defined types were placed in this encounter.  The patient has a good understanding of the overall plan. she agrees with it. she will call with any problems that may develop before the next visit here.  Nicholas Lose, MD 03/06/2019  Julious Oka Dorshimer am acting as scribe for Dr. Nicholas Lose.  I have reviewed the above documentation for accuracy and completeness, and I agree with the above.

## 2019-03-06 ENCOUNTER — Inpatient Hospital Stay: Payer: Medicare HMO | Attending: Hematology and Oncology

## 2019-03-06 ENCOUNTER — Ambulatory Visit: Payer: Medicare HMO

## 2019-03-06 ENCOUNTER — Inpatient Hospital Stay: Payer: Medicare HMO

## 2019-03-06 ENCOUNTER — Inpatient Hospital Stay (HOSPITAL_BASED_OUTPATIENT_CLINIC_OR_DEPARTMENT_OTHER): Payer: Medicare HMO | Admitting: Hematology and Oncology

## 2019-03-06 ENCOUNTER — Other Ambulatory Visit: Payer: Self-pay

## 2019-03-06 DIAGNOSIS — Z5112 Encounter for antineoplastic immunotherapy: Secondary | ICD-10-CM | POA: Insufficient documentation

## 2019-03-06 DIAGNOSIS — M898X9 Other specified disorders of bone, unspecified site: Secondary | ICD-10-CM

## 2019-03-06 DIAGNOSIS — R530 Neoplastic (malignant) related fatigue: Secondary | ICD-10-CM | POA: Diagnosis not present

## 2019-03-06 DIAGNOSIS — C50411 Malignant neoplasm of upper-outer quadrant of right female breast: Secondary | ICD-10-CM

## 2019-03-06 DIAGNOSIS — Z170 Estrogen receptor positive status [ER+]: Secondary | ICD-10-CM | POA: Diagnosis not present

## 2019-03-06 DIAGNOSIS — Z5111 Encounter for antineoplastic chemotherapy: Secondary | ICD-10-CM | POA: Insufficient documentation

## 2019-03-06 DIAGNOSIS — R197 Diarrhea, unspecified: Secondary | ICD-10-CM

## 2019-03-06 DIAGNOSIS — Z5189 Encounter for other specified aftercare: Secondary | ICD-10-CM | POA: Insufficient documentation

## 2019-03-06 DIAGNOSIS — Z95828 Presence of other vascular implants and grafts: Secondary | ICD-10-CM

## 2019-03-06 DIAGNOSIS — R110 Nausea: Secondary | ICD-10-CM

## 2019-03-06 LAB — CMP (CANCER CENTER ONLY)
ALT: 66 U/L — ABNORMAL HIGH (ref 0–44)
AST: 30 U/L (ref 15–41)
Albumin: 3.5 g/dL (ref 3.5–5.0)
Alkaline Phosphatase: 84 U/L (ref 38–126)
Anion gap: 10 (ref 5–15)
BUN: 15 mg/dL (ref 6–20)
CO2: 27 mmol/L (ref 22–32)
Calcium: 8.4 mg/dL — ABNORMAL LOW (ref 8.9–10.3)
Chloride: 106 mmol/L (ref 98–111)
Creatinine: 0.66 mg/dL (ref 0.44–1.00)
GFR, Est AFR Am: >60 mL/min (ref >60)
GFR, Estimated: >60 mL/min (ref >60)
Glucose, Bld: 93 mg/dL (ref 70–99)
Potassium: 3.3 mmol/L — ABNORMAL LOW (ref 3.5–5.1)
Sodium: 143 mmol/L (ref 135–145)
Total Bilirubin: 0.3 mg/dL (ref 0.3–1.2)
Total Protein: 5.8 g/dL — ABNORMAL LOW (ref 6.5–8.1)

## 2019-03-06 LAB — CBC WITH DIFFERENTIAL (CANCER CENTER ONLY)
Abs Immature Granulocytes: 0.04 10*3/uL (ref 0.00–0.07)
Basophils Absolute: 0 10*3/uL (ref 0.0–0.1)
Basophils Relative: 1 %
Eosinophils Absolute: 0 10*3/uL (ref 0.0–0.5)
Eosinophils Relative: 0 %
HCT: 31.1 % — ABNORMAL LOW (ref 36.0–46.0)
Hemoglobin: 9.9 g/dL — ABNORMAL LOW (ref 12.0–15.0)
Immature Granulocytes: 1 %
Lymphocytes Relative: 14 %
Lymphs Abs: 1 10*3/uL (ref 0.7–4.0)
MCH: 32.7 pg (ref 26.0–34.0)
MCHC: 31.8 g/dL (ref 30.0–36.0)
MCV: 102.6 fL — ABNORMAL HIGH (ref 80.0–100.0)
Monocytes Absolute: 0.8 10*3/uL (ref 0.1–1.0)
Monocytes Relative: 12 %
Neutro Abs: 5.2 10*3/uL (ref 1.7–7.7)
Neutrophils Relative %: 72 %
Platelet Count: 194 10*3/uL (ref 150–400)
RBC: 3.03 MIL/uL — ABNORMAL LOW (ref 3.87–5.11)
RDW: 19 % — ABNORMAL HIGH (ref 11.5–15.5)
WBC Count: 7.1 10*3/uL (ref 4.0–10.5)
nRBC: 0 % (ref 0.0–0.2)

## 2019-03-06 MED ORDER — SODIUM CHLORIDE 0.9 % IV SOLN
Freq: Once | INTRAVENOUS | Status: AC
  Administered 2019-03-06: 13:00:00 via INTRAVENOUS
  Filled 2019-03-06: qty 5

## 2019-03-06 MED ORDER — SODIUM CHLORIDE 0.9% FLUSH
10.0000 mL | Freq: Once | INTRAVENOUS | Status: AC
  Administered 2019-03-06: 10 mL
  Filled 2019-03-06: qty 10

## 2019-03-06 MED ORDER — ACETAMINOPHEN 325 MG PO TABS
650.0000 mg | ORAL_TABLET | Freq: Once | ORAL | Status: AC
  Administered 2019-03-06: 650 mg via ORAL

## 2019-03-06 MED ORDER — TRASTUZUMAB CHEMO 150 MG IV SOLR
6.0000 mg/kg | Freq: Once | INTRAVENOUS | Status: AC
  Administered 2019-03-06: 378 mg via INTRAVENOUS
  Filled 2019-03-06: qty 18

## 2019-03-06 MED ORDER — SODIUM CHLORIDE 0.9 % IV SOLN
Freq: Once | INTRAVENOUS | Status: AC
  Administered 2019-03-06: 13:00:00 via INTRAVENOUS
  Filled 2019-03-06: qty 250

## 2019-03-06 MED ORDER — SODIUM CHLORIDE 0.9 % IV SOLN
420.0000 mg | Freq: Once | INTRAVENOUS | Status: AC
  Administered 2019-03-06: 420 mg via INTRAVENOUS
  Filled 2019-03-06: qty 14

## 2019-03-06 MED ORDER — SODIUM CHLORIDE 0.9 % IV SOLN
75.0000 mg/m2 | Freq: Once | INTRAVENOUS | Status: AC
  Administered 2019-03-06: 120 mg via INTRAVENOUS
  Filled 2019-03-06: qty 12

## 2019-03-06 MED ORDER — PALONOSETRON HCL INJECTION 0.25 MG/5ML
0.2500 mg | Freq: Once | INTRAVENOUS | Status: AC
  Administered 2019-03-06: 13:00:00 0.25 mg via INTRAVENOUS

## 2019-03-06 MED ORDER — PALONOSETRON HCL INJECTION 0.25 MG/5ML
INTRAVENOUS | Status: AC
  Filled 2019-03-06: qty 5

## 2019-03-06 MED ORDER — ACETAMINOPHEN 325 MG PO TABS
ORAL_TABLET | ORAL | Status: AC
  Filled 2019-03-06: qty 2

## 2019-03-06 MED ORDER — DIPHENHYDRAMINE HCL 25 MG PO CAPS
ORAL_CAPSULE | ORAL | Status: AC
  Filled 2019-03-06: qty 2

## 2019-03-06 MED ORDER — SODIUM CHLORIDE 0.9 % IV SOLN
618.6000 mg | Freq: Once | INTRAVENOUS | Status: AC
  Administered 2019-03-06: 17:00:00 620 mg via INTRAVENOUS
  Filled 2019-03-06: qty 62

## 2019-03-06 MED ORDER — SODIUM CHLORIDE 0.9% FLUSH
10.0000 mL | INTRAVENOUS | Status: DC | PRN
  Administered 2019-03-06: 10 mL
  Filled 2019-03-06: qty 10

## 2019-03-06 MED ORDER — DIPHENHYDRAMINE HCL 25 MG PO CAPS
50.0000 mg | ORAL_CAPSULE | Freq: Once | ORAL | Status: AC
  Administered 2019-03-06: 50 mg via ORAL

## 2019-03-06 MED ORDER — HEPARIN SOD (PORK) LOCK FLUSH 100 UNIT/ML IV SOLN
500.0000 [IU] | Freq: Once | INTRAVENOUS | Status: AC | PRN
  Administered 2019-03-06: 500 [IU]
  Filled 2019-03-06: qty 5

## 2019-03-06 NOTE — Patient Instructions (Signed)
[facility] Discharge Instructions for Patients Receiving Chemotherapy  Today you received the following chemotherapy agents: Herceptin, Perjeta, Taxotere, Carboplatin.  To help prevent nausea and vomiting after your treatment, we encourage you to take your nausea medication as directed.   If you develop nausea and vomiting that is not controlled by your nausea medication, call the clinic.   BELOW ARE SYMPTOMS THAT SHOULD BE REPORTED IMMEDIATELY:  *FEVER GREATER THAN 100.5 F  *CHILLS WITH OR WITHOUT FEVER  NAUSEA AND VOMITING THAT IS NOT CONTROLLED WITH YOUR NAUSEA MEDICATION  *UNUSUAL SHORTNESS OF BREATH  *UNUSUAL BRUISING OR BLEEDING  TENDERNESS IN MOUTH AND THROAT WITH OR WITHOUT PRESENCE OF ULCERS  *URINARY PROBLEMS  *BOWEL PROBLEMS  UNUSUAL RASH Items with * indicate a potential emergency and should be followed up as soon as possible.  Feel free to call the clinic should you have any questions or concerns. The clinic phone number is [phone].  Please show the CHEMO ALERT CARD at check-in to the Emergency Department and triage nurse.   

## 2019-03-06 NOTE — Patient Instructions (Signed)

## 2019-03-07 ENCOUNTER — Ambulatory Visit: Payer: Medicare HMO

## 2019-03-07 ENCOUNTER — Other Ambulatory Visit (HOSPITAL_COMMUNITY): Payer: Self-pay | Admitting: *Deleted

## 2019-03-07 ENCOUNTER — Other Ambulatory Visit (HOSPITAL_COMMUNITY): Payer: Medicare HMO

## 2019-03-07 ENCOUNTER — Ambulatory Visit (HOSPITAL_COMMUNITY): Payer: Medicare HMO | Admitting: Internal Medicine

## 2019-03-07 ENCOUNTER — Telehealth: Payer: Self-pay | Admitting: *Deleted

## 2019-03-07 ENCOUNTER — Ambulatory Visit (HOSPITAL_COMMUNITY)
Admission: RE | Admit: 2019-03-07 | Discharge: 2019-03-07 | Disposition: A | Discharge status: Home / Self Care | Payer: Medicare HMO | Source: Ambulatory Visit | Attending: Internal Medicine | Admitting: Internal Medicine

## 2019-03-07 DIAGNOSIS — J449 Chronic obstructive pulmonary disease, unspecified: Secondary | ICD-10-CM | POA: Diagnosis not present

## 2019-03-07 DIAGNOSIS — G35 Multiple sclerosis: Secondary | ICD-10-CM | POA: Insufficient documentation

## 2019-03-07 DIAGNOSIS — C50411 Malignant neoplasm of upper-outer quadrant of right female breast: Secondary | ICD-10-CM

## 2019-03-07 DIAGNOSIS — I10 Essential (primary) hypertension: Secondary | ICD-10-CM | POA: Insufficient documentation

## 2019-03-07 DIAGNOSIS — Z170 Estrogen receptor positive status [ER+]: Secondary | ICD-10-CM | POA: Diagnosis not present

## 2019-03-07 NOTE — Telephone Encounter (Signed)
Spoke to pt regarding needs during chemo. Related doing well, denies questions or needs at this time. Confirmed MRI and f/u with Dr. Excell Seltzer. Encourage pt to call with needs. Received verbal understanding.

## 2019-03-07 NOTE — Progress Notes (Signed)
  Echocardiogram 2D Echocardiogram has been performed.  Darlina Sicilian M 03/07/2019, 11:42 AM

## 2019-03-07 NOTE — Progress Notes (Signed)
Pt was referred to our cardio/onc clinic by Dr Lindi Adie for her br ca.  Per protocol she is sch for her echo today and appt w/Dr Bensimhon on 5/15.  Order for echo placed.

## 2019-03-08 ENCOUNTER — Other Ambulatory Visit: Payer: Self-pay

## 2019-03-08 ENCOUNTER — Inpatient Hospital Stay: Payer: Medicare HMO

## 2019-03-08 DIAGNOSIS — C50411 Malignant neoplasm of upper-outer quadrant of right female breast: Secondary | ICD-10-CM

## 2019-03-08 DIAGNOSIS — Z170 Estrogen receptor positive status [ER+]: Secondary | ICD-10-CM

## 2019-03-08 DIAGNOSIS — Z5189 Encounter for other specified aftercare: Secondary | ICD-10-CM | POA: Diagnosis not present

## 2019-03-08 DIAGNOSIS — Z5111 Encounter for antineoplastic chemotherapy: Secondary | ICD-10-CM | POA: Diagnosis not present

## 2019-03-08 DIAGNOSIS — Z5112 Encounter for antineoplastic immunotherapy: Secondary | ICD-10-CM | POA: Diagnosis not present

## 2019-03-08 MED ORDER — PEGFILGRASTIM-CBQV 6 MG/0.6ML ~~LOC~~ SOSY
6.0000 mg | PREFILLED_SYRINGE | Freq: Once | SUBCUTANEOUS | Status: AC
  Administered 2019-03-08: 6 mg via SUBCUTANEOUS

## 2019-03-08 NOTE — Progress Notes (Signed)
Paperwork sent for pt to sign for [facility] pt assistance. Rob Brillion aware and coordinating. Kennith Center, Pharm.D., CPP 03/08/2019@3 :04 PM

## 2019-03-08 NOTE — Patient Instructions (Signed)
Pegfilgrastim injection  What is this medicine?  PEGFILGRASTIM (PEG fil gra stim) is a long-acting granulocyte colony-stimulating factor that stimulates the growth of neutrophils, a type of white blood cell important in the body's fight against infection. It is used to reduce the incidence of fever and infection in patients with certain types of cancer who are receiving chemotherapy that affects the bone marrow, and to increase survival after being exposed to high doses of radiation.  This medicine may be used for other purposes; ask your health care provider or pharmacist if you have questions.  COMMON BRAND NAME(S): Fulphila, Neulasta, UDENYCA  What should I tell my health care provider before I take this medicine?  They need to know if you have any of these conditions:  -kidney disease  -latex allergy  -ongoing radiation therapy  -sickle cell disease  -skin reactions to acrylic adhesives (On-Body Injector only)  -an unusual or allergic reaction to pegfilgrastim, filgrastim, other medicines, foods, dyes, or preservatives  -pregnant or trying to get pregnant  -breast-feeding  How should I use this medicine?  This medicine is for injection under the skin. If you get this medicine at home, you will be taught how to prepare and give the pre-filled syringe or how to use the On-body Injector. Refer to the patient Instructions for Use for detailed instructions. Use exactly as directed. Tell your healthcare provider immediately if you suspect that the On-body Injector may not have performed as intended or if you suspect the use of the On-body Injector resulted in a missed or partial dose.  It is important that you put your used needles and syringes in a special sharps container. Do not put them in a trash can. If you do not have a sharps container, call your pharmacist or healthcare provider to get one.  Talk to your pediatrician regarding the use of this medicine in children. While this drug may be prescribed for  selected conditions, precautions do apply.  Overdosage: If you think you have taken too much of this medicine contact a poison control center or emergency room at once.  NOTE: This medicine is only for you. Do not share this medicine with others.  What if I miss a dose?  It is important not to miss your dose. Call your doctor or health care professional if you miss your dose. If you miss a dose due to an On-body Injector failure or leakage, a new dose should be administered as soon as possible using a single prefilled syringe for manual use.  What may interact with this medicine?  Interactions have not been studied.  Give your health care provider a list of all the medicines, herbs, non-prescription drugs, or dietary supplements you use. Also tell them if you smoke, drink alcohol, or use illegal drugs. Some items may interact with your medicine.  This list may not describe all possible interactions. Give your health care provider a list of all the medicines, herbs, non-prescription drugs, or dietary supplements you use. Also tell them if you smoke, drink alcohol, or use illegal drugs. Some items may interact with your medicine.  What should I watch for while using this medicine?  You may need blood work done while you are taking this medicine.  If you are going to need a MRI, CT scan, or other procedure, tell your doctor that you are using this medicine (On-Body Injector only).  What side effects may I notice from receiving this medicine?  Side effects that you should report to   your doctor or health care professional as soon as possible:  -allergic reactions like skin rash, itching or hives, swelling of the face, lips, or tongue  -back pain  -dizziness  -fever  -pain, redness, or irritation at site where injected  -pinpoint red spots on the skin  -red or dark-brown urine  -shortness of breath or breathing problems  -stomach or side pain, or pain at the shoulder  -swelling  -tiredness  -trouble passing urine or  change in the amount of urine  Side effects that usually do not require medical attention (report to your doctor or health care professional if they continue or are bothersome):  -bone pain  -muscle pain  This list may not describe all possible side effects. Call your doctor for medical advice about side effects. You may report side effects to FDA at 1-800-FDA-1088.  Where should I keep my medicine?  Keep out of the reach of children.  If you are using this medicine at home, you will be instructed on how to store it. Throw away any unused medicine after the expiration date on the label.  NOTE: This sheet is a summary. It may not cover all possible information. If you have questions about this medicine, talk to your doctor, pharmacist, or health care provider.   2019 Elsevier/Gold Standard (2018-01-16 16:57:08)

## 2019-03-09 ENCOUNTER — Ambulatory Visit (HOSPITAL_COMMUNITY)
Admission: RE | Admit: 2019-03-09 | Discharge: 2019-03-09 | Disposition: A | Discharge status: Home / Self Care | Payer: Medicare HMO | Source: Ambulatory Visit | Attending: Internal Medicine | Admitting: Internal Medicine

## 2019-03-09 DIAGNOSIS — Z170 Estrogen receptor positive status [ER+]: Secondary | ICD-10-CM | POA: Diagnosis not present

## 2019-03-09 DIAGNOSIS — R55 Syncope and collapse: Secondary | ICD-10-CM

## 2019-03-09 DIAGNOSIS — C50411 Malignant neoplasm of upper-outer quadrant of right female breast: Secondary | ICD-10-CM | POA: Diagnosis not present

## 2019-03-09 NOTE — Progress Notes (Signed)
Message sent to schedulers to arrange 3 month f/u w/echocardiogram

## 2019-03-09 NOTE — Addendum Note (Signed)
Encounter addended by: Scarlette Calico, RN on: 03/09/2019 2:01 PM  Actions taken: Order list changed, Diagnosis association updated, Clinical Note Signed

## 2019-03-09 NOTE — Progress Notes (Signed)
Heart Failure TeleHealth Note  Due to national recommendations of social distancing due to [place] 19, Audio/video telehealth visit is felt to be most appropriate for this patient at this time.  See MyChart message from today for patient consent regarding telehealth for Debbie Watts.  Date:  03/09/2019   ID:  Debbie Watts, DOB 22-Oct-1961, MRN [id number]  Location: Home  Provider location: [facility] Type of Visit: New patient (consult)  PCP:  Debbie Seat, MD  Cardiologist:  No primary care provider on file. Primary HF: Debbie Watts Referring: Debbie Watts  Chief Complaint: Breast CA   History of Present Illness:  [name] is a 58 y/o woman with HTN, migraines, multiple sclerosis and right breast CA referred by Dr. Lindi Watts for enrollment into the cardio-oncology Watts for monitoring during her chemotherapy as well as further evaluation of recent syncope.   She presents via Engineer, civil (consulting) for a telehealth visit today due to the [place] pandemic.   Denies any h/o known cardiac disease. Diagnosed with R breast CA in 2/20. Started neo-adjuvant chemo with TCH-P in 2/20.   Not overly activity.On disability due to MS. 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 No CP or SOB.   Recently admitted in 3/20 for syncopal episode. Said she has been experiencing nausea and intermittent diarrhea since starting chemotherapy. Was doing wellwith no vomiting or diarrhea, had a couple loose stools that morning, was on her way to the bathroom with urge to defecate again when she became acutely lightheaded and experienced a transient loss of consciousness.  Denies CP or palpitations. No seizure activity or post-ictal state. This resulted in a fall and she hit the back of her head on the ground. She vomited once following the loss of consciousness, had another episode of diarrhea, took some Imodium, and eventually came to the ED. Head CT, ECG and troponins all normal .About  8 years ago also had a syncope episode while drinking alcohol and she got real hot and passed out.   Echo 12/06/18 EF 65-70% GLS -19.2 Echo 03/07/19  EF 60-65% GLS -13.4% (poor tracking)  SUMMARY OF ONCOLOGIC HISTORY:       Malignant neoplasm of upper-outer quadrant of right breast in female, estrogen receptor positive ([place])   11/21/2018 Initial Diagnosis    Screening detected right breast mass upper outer quadrant, in addition to areas of calcifications which were not biopsied.  By ultrasound she had 2 breast masses 12 o'clock position 2.9 cm: Grade 2 IDC with high-grade DCIS ER 100%, PR 70%, Ki-67 10%, HER-2 3+ by IHC and 11 o'clock position 1 cm, ER 90%, PR 50%, Ki-67 15%, HER-2 3+ by IHC, T2N0 stage Ib    11/29/2018 Cancer Staging    Staging form: Breast, AJCC 8th Edition - Clinical stage from 11/29/2018: Stage IB (cT2, cN0, cM0, G3, ER+, PR+, HER2+) - Signed by Debbie Lose, MD on 11/29/2018    12/12/2018 -  Neo-Adjuvant Chemotherapy    Neoadjuvant chemotherapy with Debbie Watts    01/18/2019 - 01/20/2019 Hospital Admission    Syncope      Debbie Watts denies symptoms worrisome for COVID 19.   Past Medical History:  Diagnosis Date  . Depression   . Deviated nasal septum   . Hearing loss 06/11/2014   Bilateral  . Herniated cervical disc   . Hypertension   . Migraine   . Miscarriage    2  . MS (multiple sclerosis) ([place])    Past Surgical History:  Procedure Laterality Date  . NASAL SEPTUM SURGERY    . spinal injections       Current Outpatient Medications  Medication Sig Dispense Refill  . betamethasone valerate lotion (VALISONE) 0.1 % Apply 1 application topically daily.     . carbidopa-levodopa (SINEMET IR) 10-100 MG tablet TAKE 1 TABLET BY MOUTH AT BEDTIME. 90 tablet 1  . dexamethasone (DECADRON) 4 MG tablet TAKE 1 TABLET BY MOUTH DAILY. TAKE 1 TABLET DAY BEFORE CHEMO AND 1 TABLET DAY AFTER CHEMO WITH FOOD 12 tablet 0  . diclofenac (VOLTAREN) 75 MG  EC tablet TAKE 1 TABLET BY MOUTH TWICE A DAY (Patient taking differently: Take 75 mg by mouth 2 (two) times daily. ) 60 tablet 5  . Dimethyl Fumarate 240 MG CPDR Take 1 capsule (240 mg total) by mouth 2 (two) times daily. 180 capsule 3  . eletriptan (RELPAX) 40 MG tablet Take 40 mg by mouth as needed for migraine or headache. One tablet by mouth at onset of headache. May repeat in 2 hours if headache persists or recurs.    . fluconazole (DIFLUCAN) 100 MG tablet TAKE 1 TABLET BY MOUTH EVERY DAY 30 tablet 0  . fluticasone (FLONASE) 50 MCG/ACT nasal spray Place 2 sprays into both nostrils at bedtime.    . gabapentin (NEURONTIN) 300 MG capsule TAKE 2 CAPSULES (600 MG TOTAL) BY MOUTH 2 (TWO) TIMES DAILY. 360 capsule 1  . lidocaine-prilocaine (EMLA) cream Apply to affected area once 30 g 3  . lisinopril (PRINIVIL,ZESTRIL) 5 MG tablet TAKE 1 TABLET BY MOUTH EVERY DAY 90 tablet 3  . LORazepam (ATIVAN) 0.5 MG tablet Take 1 tablet (0.5 mg total) by mouth at bedtime as needed (Nausea or vomiting). 30 tablet 0  . ondansetron (ZOFRAN) 8 MG tablet Take 1 tablet (8 mg total) by mouth 2 (two) times daily as needed (Nausea or vomiting). Begin 4 days after chemotherapy. 30 tablet 1  . oxyCODONE-acetaminophen (PERCOCET/ROXICET) 5-325 MG tablet Take 1 tablet by mouth every 8 (eight) hours as needed for severe pain. 30 tablet 0  . PARoxetine (PAXIL) 20 MG tablet Take 1 tablet (20 mg total) by mouth daily. 90 tablet 3  . prochlorperazine (COMPAZINE) 10 MG tablet TAKE 1 TABLET (10 MG TOTAL) BY MOUTH EVERY 6 (SIX) HOURS AS NEEDED (NAUSEA OR VOMITING). 30 tablet 1  . traMADol (ULTRAM) 50 MG tablet TAKE 1 TABLET BY MOUTH TWICE A DAY AS NEEDED 60 tablet 3  . VENTOLIN HFA 108 (90 Base) MCG/ACT inhaler Inhale 1-2 puffs into the lungs every 6 (six) hours as needed for wheezing or shortness of breath. 1 Inhaler 3   No current facility-administered medications for this encounter.     Allergies:   Procaine   Social History:   The patient  reports that she has been smoking cigarettes. She has a 22.00 pack-year smoking history. She has never used smokeless tobacco. She reports current alcohol use. She reports current drug use. Drug: Marijuana.   Family History:  The patient's family history includes Cancer in her maternal uncle. She was adopted.   ROS:  Please see the history of present illness.   All other systems are personally reviewed and negative.   Exam:  (Video/Tele Health Call; Exam is subjective and or/visual.) General:  Speaks in full sentences. No resp difficulty. Lungs: Normal respiratory effort with conversation.  Abdomen: Non-distended per patient report Extremities: Pt denies edema. Neuro: Alert & oriented x 3.   Recent Labs: 01/19/2019: Magnesium 2.1 03/06/2019: ALT 66; BUN  15; Creatinine 0.66; Hemoglobin 9.9; Platelet Count 194; Potassium 3.3; Sodium 143  Personally reviewed   Wt Readings from Last 3 Encounters:  03/06/19 62.6 kg (138 lb 1.6 oz)  02/13/19 63 kg (138 lb 12.8 oz)  01/22/19 61.5 kg (135 lb 8 oz)      ASSESSMENT AND PLAN:  1. R breast cancer - Explained incidence of Herceptin cardiotoxicity and role of Cardio-oncology Watts at length. Echo images reviewed personally. All parameters stable. Reviewed signs and symptoms of HF to look for. Continue Herceptin. Follow-up with echo in 3 months. - Surgery/XRT pending   2. Syncope - Suspect due to high vagal tone associated with defecation syncope. W/u negative. If recurs will place monitor.   COVID screen The patient does not have any symptoms that suggest any further testing/ screening at this time.  Social distancing reinforced today.  Recommended follow-up:  3 months with echo   Relevant cardiac medications were reviewed at length with the patient today.   The patient does not have concerns regarding their medications at this time.   The following changes were made today:  As above  Today, I have spent 28 minutes with the  patient with telehealth technology discussing the above issues .    Signed, Glori Bickers, MD  03/09/2019 1:01 PM  [facility] [place] [address] [facility] [place] [address] [phone] (office) [phone] (fax)

## 2019-03-09 NOTE — Patient Instructions (Signed)
Your physician recommends that you schedule a follow-up appointment in: 3 months with echocardiogram  

## 2019-03-15 ENCOUNTER — Telehealth: Payer: Self-pay | Admitting: *Deleted

## 2019-03-15 NOTE — Telephone Encounter (Signed)
Called, LVM for pt at [phone]. Asked her to call back today d/t office being closed tomorrow Monday. Need to see if she is ok to transition appt to virtual visit on Tuesday with Dr. Felecia Shelling.   Tried [employer] (on [place]) at [phone]. Asked him to have Meilin call office today about appt on Tuesday.  Katie H (checkout). Was on phone same time as I was trying to call her. I tried calling pt back. Was able to speak with her and update med list/pharmacy/allergies on file for virtual visit 03/20/19.

## 2019-03-20 ENCOUNTER — Encounter: Payer: Self-pay | Admitting: Neurology

## 2019-03-20 ENCOUNTER — Ambulatory Visit (INDEPENDENT_AMBULATORY_CARE_PROVIDER_SITE_OTHER): Payer: 59 | Admitting: Neurology

## 2019-03-20 ENCOUNTER — Other Ambulatory Visit: Payer: Self-pay

## 2019-03-20 DIAGNOSIS — G43709 Chronic migraine without aura, not intractable, without status migrainosus: Secondary | ICD-10-CM

## 2019-03-20 DIAGNOSIS — Z170 Estrogen receptor positive status [ER+]: Secondary | ICD-10-CM

## 2019-03-20 DIAGNOSIS — R269 Unspecified abnormalities of gait and mobility: Secondary | ICD-10-CM

## 2019-03-20 DIAGNOSIS — G35 Multiple sclerosis: Secondary | ICD-10-CM

## 2019-03-20 DIAGNOSIS — C50411 Malignant neoplasm of upper-outer quadrant of right female breast: Secondary | ICD-10-CM

## 2019-03-20 MED ORDER — GABAPENTIN 300 MG PO CAPS: 600.0000 mg | ORAL_CAPSULE | Freq: Two times a day (BID) | ORAL | 3 refills | Status: DC

## 2019-03-20 MED ORDER — CARBIDOPA-LEVODOPA 10-100 MG PO TABS: 1.0000 | ORAL_TABLET | Freq: Every day | ORAL | 3 refills | Status: DC

## 2019-03-20 NOTE — Progress Notes (Signed)
[facility]  PATIENT: Debbie Watts DOB: [birth year]    HISTORICAL  CHIEF COMPLAINT:  No chief complaint on file.   HISTORY OF PRESENT ILLNESS:  [name] is a 58 y.o. woman with multiple sclerosis.  Update 03/20/2019: Virtual Visit via Video Note I connected with Peter Garter  on 03/20/19 at  3:30 PM EDT by a video enabled telemedicine application and verified that I am speaking with the correct person.  I discussed the limitations of evaluation and management by telemedicine and the availability of in person appointments. The patient expressed understanding and agreed to proceed.  History of Present Illness: She denies new MS exacerbations or new symptoms.   She is on Tecfidera and tolerates it well.      Her gait is doing ok and she could walk 20 minutes without stopping (> 1 mile).   She fell 2 months ago but no injury.   She denies leg weakness.    No issues with dysesthesias.   She has urinary frequency but stopped oxybutynin  Due to UTI'sd.   Vision is doing ok..    Migraines are doing ok and she will take Tylenol or Excedrin Migraine with benefit   These occur 4-5 times a month.      She feels fatigue occurs almost every day, more in the afternoons.      Sleep is variable.   RLS is helped by Sinemet and gabepentin.     She has some depression though Paxil has helped.       She was diagnosed with breast cancer January 2020.     She is on TCHP (docetaxel, carboplatin, trastuzumab, pertuzumab (Perjeta)).   She has had 5 cycles of chemotherapy and her last infusion will be June 2.  Labs were checked and lymphocyte counts have not dropped.   Observations/Objective:   She is a well-developed well-nourished woman in no acute distress.  She has alopecia (chemo).   The head is normocephalic and atraumatic.  Sclera are anicteric.  Visible skin appears normal.  The neck has a good range of motion.  Pharynx and tongue have normal appearance.  She is alert and  fully oriented with fluent speech and good attention, knowledge and memory.  Extraocular muscles are intact.  Facial strength is normal.  Palatal elevation and tongue protrusion are midline.  She appears to have normal strength in the arms.  Rapid alternating movements and finger-nose-finger are performed well.  Assessment and Plan: Multiple sclerosis (HCC)  Chronic migraine without aura without status migrainosus, not intractable  Malignant neoplasm of upper-outer quadrant of right breast in female, estrogen receptor positive (HCC)  Gait disturbance  MS (multiple sclerosis) ([place]) - Plan: gabapentin (NEURONTIN) 300 MG capsule  1.   Continue Tecfidera.  She is tolerating it well and has not had decreased lymphocytes in the setting of chemotherapy for her breast cancer..   2.    Continue other medications for restless leg syndrome and bladder and pain 3.    Stay active and exercise as tolerated.     4.   Return in 6 months or sooner if there are new or worsening neurologic symptoms.   Follow Up Instructions: I discussed the assessment and treatment plan with the patient. The patient was provided an opportunity to ask questions and all were answered. The patient agreed with the plan and demonstrated an understanding of the instructions.    The patient was advised to call back or seek an in-person evaluation  if the symptoms worsen or if the condition fails to improve as anticipated.  I provided 26 minutes of non-face-to-face time during this encounter.  __________________________________________ From previous visits: Update 09/19/2018: She feels she is mostly stable.   She denies exacerbations or new symptoms.   She is on Tecfidera and tolerates it well.      She feels gait is doing well and she could easily walk a mile.   She had one fall.   She has LBP if she stands or walk a while.  She  Also has RLS ans is helped by Sinemet at night.  She has urinary frequency but stopped oxybutynin due  to frequent UTI's.  Sometimes she wakes up with headaches and takes Tylenol or Excedrin Migraine.   These occur 4-5 times a month.      She feels fatigue occurs almost every day, more in the afternoons.      She sleeps well most nights.    She has some depression though Paxil has helped.       01/12/2018: She is currently on Tecfidera.  Before that she was on a study drug (ALKS 8700) that was very similar to take daily.  She feels her MS is doing well.  She has not had any exacerbations.  She is tolerating the medication well with no GI side effects or flushing.  She is walking ok and could walk 20 minutes or about a mile if she had to.     However, her back hurts if she stands a long time or walks longer.    She feels a little weaker on her right side and she has numbness in her right hand.    She has noted more urinary urgency the past year.    She wakes up 2 -3 times nightly for nocturia.  Other times, she notes hesitancy and sometimes does not completely empty.   She has no recent UTI.    Vision is stable.   She wearss glasses and has not had ON or diplopia in the past.      She has fatigue.    She has some sleep onset insomnia.   She takes Sinemet and gabeontin for RLS with benefit.  She wakes up to use the bathroom and sometimes has trouble falling back asleep.    She has some stress with her boyfriend needing a foot amputation (DM, gangrene).   She sometimes has mild depression but not enough to consider a medication.   Cognitively, she is doing well.     MS history: She was diagnosed with MS in 1999 based on MRI and lumbar rupture.  At that time she was living in [place] and was seeing Dr. Tilden Fossa.  When he retired she saw another doctor and then she moved to this area around 2015 but did not seek neurologic referral until 2015.  She was on Avonex for many years but stopped due to insurance.  Then, in November 2016, she started in the drug study and was on ALKS 8700 4 2 years.  The study ended in  late 2018 for her and she switched over to [place].   Her last MRI 08/09/2017 showed stable MS lesions.       REVIEW OF SYSTEMS: Constitutional: No fevers, chills, sweats, or change in appetite.  She has fatigue.  She sometimes has insomnia. Eyes: No visual changes, double vision, eye pain Ear, nose and throat: No hearing loss, ear pain, nasal congestion, sore throat Cardiovascular: No chest  pain, palpitations Respiratory: No shortness of breath at rest or with exertion.   No wheezes GastrointestinaI: No nausea, vomiting, diarrhea, abdominal pain, fecal incontinence Genitourinary:She has urinary frequency and nocturia . Musculoskeletal: No neck pain, back pain Integumentary: No rash, pruritus, skin lesions Neurological: as above Psychiatric: No depression at this time.  No anxiety Endocrine: No palpitations, diaphoresis, change in appetite, change in weigh or increased thirst Hematologic/Lymphatic: No anemia, purpura, petechiae. Allergic/Immunologic: No itchy/runny eyes, nasal congestion, recent allergic reactions, rashes  ALLERGIES: Allergies  Allergen Reactions   Procaine Shortness Of Breath    HOME MEDICATIONS:  Current Outpatient Medications:    betamethasone valerate lotion (VALISONE) 0.1 %, Apply 1 application topically daily. , Disp: , Rfl:    carbidopa-levodopa (SINEMET IR) 10-100 MG tablet, Take 1 tablet by mouth at bedtime., Disp: 90 tablet, Rfl: 3   dexamethasone (DECADRON) 4 MG tablet, TAKE 1 TABLET BY MOUTH DAILY. TAKE 1 TABLET DAY BEFORE CHEMO AND 1 TABLET DAY AFTER CHEMO WITH FOOD, Disp: 12 tablet, Rfl: 0   diclofenac (VOLTAREN) 75 MG EC tablet, TAKE 1 TABLET BY MOUTH TWICE A DAY (Patient taking differently: Take 75 mg by mouth 2 (two) times daily. ), Disp: 60 tablet, Rfl: 5   Dimethyl Fumarate 240 MG CPDR, Take 1 capsule (240 mg total) by mouth 2 (two) times daily., Disp: 180 capsule, Rfl: 3   eletriptan (RELPAX) 40 MG tablet, Take 40 mg by mouth as  needed for migraine or headache. One tablet by mouth at onset of headache. May repeat in 2 hours if headache persists or recurs., Disp: , Rfl:    fluconazole (DIFLUCAN) 100 MG tablet, TAKE 1 TABLET BY MOUTH EVERY DAY, Disp: 30 tablet, Rfl: 0   fluticasone (FLONASE) 50 MCG/ACT nasal spray, Place 2 sprays into both nostrils at bedtime., Disp: , Rfl:    gabapentin (NEURONTIN) 300 MG capsule, Take 2 capsules (600 mg total) by mouth 2 (two) times daily., Disp: 360 capsule, Rfl: 3   lidocaine-prilocaine (EMLA) cream, Apply to affected area once (Patient taking differently: Apply 1 application topically. Apply to affected area once- for port), Disp: 30 g, Rfl: 3   lisinopril (PRINIVIL,ZESTRIL) 5 MG tablet, TAKE 1 TABLET BY MOUTH EVERY DAY, Disp: 90 tablet, Rfl: 3   LORazepam (ATIVAN) 0.5 MG tablet, Take 1 tablet (0.5 mg total) by mouth at bedtime as needed (Nausea or vomiting)., Disp: 30 tablet, Rfl: 0   ondansetron (ZOFRAN) 8 MG tablet, Take 1 tablet (8 mg total) by mouth 2 (two) times daily as needed (Nausea or vomiting). Begin 4 days after chemotherapy., Disp: 30 tablet, Rfl: 1   oxyCODONE-acetaminophen (PERCOCET/ROXICET) 5-325 MG tablet, Take 1 tablet by mouth every 8 (eight) hours as needed for severe pain., Disp: 30 tablet, Rfl: 0   PARoxetine (PAXIL) 20 MG tablet, Take 1 tablet (20 mg total) by mouth daily., Disp: 90 tablet, Rfl: 3   prochlorperazine (COMPAZINE) 10 MG tablet, TAKE 1 TABLET (10 MG TOTAL) BY MOUTH EVERY 6 (SIX) HOURS AS NEEDED (NAUSEA OR VOMITING)., Disp: 30 tablet, Rfl: 1   traMADol (ULTRAM) 50 MG tablet, TAKE 1 TABLET BY MOUTH TWICE A DAY AS NEEDED, Disp: 60 tablet, Rfl: 3   VENTOLIN HFA 108 (90 Base) MCG/ACT inhaler, Inhale 1-2 puffs into the lungs every 6 (six) hours as needed for wheezing or shortness of breath., Disp: 1 Inhaler, Rfl: 3  PAST MEDICAL HISTORY: Past Medical History:  Diagnosis Date   Depression    Deviated nasal septum    Hearing  loss 06/11/2014    Bilateral   Herniated cervical disc    Hypertension    Migraine    Miscarriage    2   MS (multiple sclerosis) ([place])     PAST SURGICAL HISTORY: Past Surgical History:  Procedure Laterality Date   NASAL SEPTUM SURGERY     spinal injections      FAMILY HISTORY: Family History  Adopted: Yes  Problem Relation Age of Onset   Cancer Maternal Uncle        throat    SOCIAL HISTORY:  Social History   Socioeconomic History   Marital status: Single    Spouse name: Not on file   Number of children: 2   Years of education: 12   Highest education level: Not on file  Occupational History   Occupation: Disabled  Scientist, product/process development strain: Not on file   Food insecurity:    Worry: Not on file    Inability: Not on file   Transportation needs:    Medical: Not on file    Non-medical: Not on file  Tobacco Use   Smoking status: Current Every Day Smoker    Packs/day: 0.50    Years: 44.00    Pack years: 22.00    Types: Cigarettes   Smokeless tobacco: Never Used   Tobacco comment: 1/2  to 3/4 pack per day   Substance and Sexual Activity   Alcohol use: Yes    Comment: 0-5 beers daily   Drug use: Yes    Types: Marijuana   Sexual activity: Not Currently    Partners: Male  Lifestyle   Physical activity:    Days per week: Not on file    Minutes per session: Not on file   Stress: Not on file  Relationships   Social connections:    Talks on phone: Not on file    Gets together: Not on file    Attends religious service: Not on file    Active member of club or organization: Not on file    Attends meetings of clubs or organizations: Not on file    Relationship status: Not on file   Intimate partner violence:    Fear of current or ex partner: Not on file    Emotionally abused: Not on file    Physically abused: Not on file    Forced sexual activity: Not on file  Other Topics Concern   Not on file  Social History Narrative   Patient is  single with 2 children.   Patient is right handed.   Current Social History 08/10/2018        Right handed       Patient lives with significant other [name]) in one level Townhome 08/10/2018    Transportation: Patient has own vehicle  08/10/2018   Important Relationships [name] 08/10/2018    Pets: one dog named Browser 08/10/2018   Education / Work:  12th grade/ Disabled 08/10/2018   Interests / Fun: Watching baseball and Nascar 08/10/2018   Current Stressors: Significant other is depressed over his poor health 08/10/2018   Religious / Personal Beliefs: "I believe in God" 08/10/2018   Other: "I had a miscarriage before I had my 2 sons." 08/10/2018   L. Silvano Rusk, RN, BSN  PHYSICAL EXAM  There were no vitals filed for this visit.  There is no height or weight on file to calculate BMI.   General: The patient is well-developed and well-nourished and in no acute distress   Neurologic Exam  Mental status: The patient is alert and oriented x 3 at the time of the examination. The patient has apparent normal recent and remote memory, with an apparently normal attention span and concentration ability.   Speech is normal.  Cranial nerves: Extraocular movements are full.  Facial strength and sensation was normal.  The trapezius strength was normal bilaterally.  The tongue is midline, and the patient has symmetric elevation of the soft palate. No obvious hearing deficits are noted.  Motor:  Muscle bulk is normal.   Tone is normal. Strength is  5 / 5 in all 4 extremities.   Sensory: She had slight asymmetry of sensation, reduced on the left to touch and vibration  Coordination: Cerebellar testing shows good finger-nose-finger.  Heel-to-shin is mildly reduced bilaterally.  Gait and station: Station is normal.   The gait is mildly wide.  Tandem gait is very wide.  Romberg is  negative.  Reflexes: Deep tendon reflexes are symmetric and normal in arms, increased in legs but no clonus at ankles.        DIAGNOSTIC DATA (LABS, IMAGING, TESTING) - I reviewed patient records, labs, notes, testing and imaging myself where available.  Lab Results  Component Value Date   WBC 7.1 03/06/2019   HGB 9.9 (L) 03/06/2019   HCT 31.1 (L) 03/06/2019   MCV 102.6 (H) 03/06/2019   PLT 194 03/06/2019      Component Value Date/Time   NA 143 03/06/2019 1050   NA 140 08/31/2017 1524   K 3.3 (L) 03/06/2019 1050   CL 106 03/06/2019 1050   CO2 27 03/06/2019 1050   GLUCOSE 93 03/06/2019 1050   BUN 15 03/06/2019 1050   BUN 17 08/31/2017 1524   CREATININE 0.66 03/06/2019 1050   CALCIUM 8.4 (L) 03/06/2019 1050   PROT 5.8 (L) 03/06/2019 1050   PROT 6.4 08/31/2017 1524   ALBUMIN 3.5 03/06/2019 1050   ALBUMIN 4.3 08/31/2017 1524   AST 30 03/06/2019 1050   ALT 66 (H) 03/06/2019 1050   ALKPHOS 84 03/06/2019 1050   BILITOT 0.3 03/06/2019 1050   GFRNONAA >60 03/06/2019 1050   GFRAA >60 03/06/2019 1050   No results found for: CHOL, HDL, LDLCALC, LDLDIRECT, TRIG, CHOLHDL Lab Results  Component Value Date   HGBA1C 5.3 12/07/2017    Jermall Isaacson A. Felecia Shelling, MD, [facility] 01/15/4009, 2:72 PM Certified in Neurology, Clinical Neurophysiology, Sleep Medicine, Pain Medicine and Neuroimaging  [facility] [address], [place], [place] [address] [phone]

## 2019-03-26 NOTE — Progress Notes (Signed)
Patient Care Team: Neva Seat, MD as PCP - General (Internal Medicine) Excell Seltzer, MD as Consulting Physician (General Surgery) Nicholas Lose, MD as Consulting Physician (Hematology and Oncology) Kyung Rudd, MD as Consulting Physician (Radiation Oncology)  DIAGNOSIS:    ICD-10-CM   1. Malignant neoplasm of upper-outer quadrant of right breast in female, estrogen receptor positive ([place]) C50.411    Z17.0     SUMMARY OF ONCOLOGIC HISTORY:   Malignant neoplasm of upper-outer quadrant of right breast in female, estrogen receptor positive ([place])   11/21/2018 Initial Diagnosis    Screening detected right breast mass upper outer quadrant, in addition to areas of calcifications which were not biopsied.  By ultrasound she had 2 breast masses 12 o'clock position 2.9 cm: Grade 2 IDC with high-grade DCIS ER 100%, PR 70%, Ki-67 10%, HER-2 3+ by IHC and 11 o'clock position 1 cm, ER 90%, PR 50%, Ki-67 15%, HER-2 3+ by IHC, T2N0 stage Ib    11/29/2018 Cancer Staging    Staging form: Breast, AJCC 8th Edition - Clinical stage from 11/29/2018: Stage IB (cT2, cN0, cM0, G3, ER+, PR+, HER2+) - Signed by Nicholas Lose, MD on 11/29/2018    12/12/2018 -  Neo-Adjuvant Chemotherapy    Neoadjuvant chemotherapy with [facility] Perjeta    01/18/2019 - 01/20/2019 Hospital Admission    Syncope     CHIEF COMPLIANT: Cycle 6 TCHP  INTERVAL HISTORY: Debbie Watts is a 58 y.o. with above-mentioned history of right breast cancer currently on neoadjuvant chemotherapy with TCHP.ECHO on 03/07/19 showed an ejection fraction in the range of 60-65%. She presents to the clinicalonetoday for cycle 6.  REVIEW OF SYSTEMS:   Constitutional: Denies fevers, chills or abnormal weight loss Eyes: Denies blurriness of vision Ears, nose, mouth, throat, and face: Denies mucositis or sore throat Respiratory: Denies cough, dyspnea or wheezes Cardiovascular: Denies palpitation, chest discomfort Gastrointestinal: Denies nausea,  heartburn or change in bowel habits Skin: Denies abnormal skin rashes Lymphatics: Denies new lymphadenopathy or easy bruising Neurological: Denies numbness, tingling or new weaknesses Behavioral/Psych: Mood is stable, no new changes  Extremities: No lower extremity edema Breast: denies any pain or lumps or nodules in either breasts All other systems were reviewed with the patient and are negative.  I have reviewed the past medical history, past surgical history, social history and family history with the patient and they are unchanged from previous note.  ALLERGIES:  is allergic to procaine.  MEDICATIONS:  Current Outpatient Medications  Medication Sig Dispense Refill  . betamethasone valerate lotion (VALISONE) 0.1 % Apply 1 application topically daily.     . carbidopa-levodopa (SINEMET IR) 10-100 MG tablet Take 1 tablet by mouth at bedtime. 90 tablet 3  . dexamethasone (DECADRON) 4 MG tablet TAKE 1 TABLET BY MOUTH DAILY. TAKE 1 TABLET DAY BEFORE CHEMO AND 1 TABLET DAY AFTER CHEMO WITH FOOD 12 tablet 0  . diclofenac (VOLTAREN) 75 MG EC tablet TAKE 1 TABLET BY MOUTH TWICE A DAY (Patient taking differently: Take 75 mg by mouth 2 (two) times daily. ) 60 tablet 5  . Dimethyl Fumarate 240 MG CPDR Take 1 capsule (240 mg total) by mouth 2 (two) times daily. 180 capsule 3  . eletriptan (RELPAX) 40 MG tablet Take 40 mg by mouth as needed for migraine or headache. One tablet by mouth at onset of headache. May repeat in 2 hours if headache persists or recurs.    . fluconazole (DIFLUCAN) 100 MG tablet TAKE 1 TABLET BY MOUTH EVERY DAY 30  tablet 0  . fluticasone (FLONASE) 50 MCG/ACT nasal spray Place 2 sprays into both nostrils at bedtime.    . gabapentin (NEURONTIN) 300 MG capsule Take 2 capsules (600 mg total) by mouth 2 (two) times daily. 360 capsule 3  . lidocaine-prilocaine (EMLA) cream Apply to affected area once (Patient taking differently: Apply 1 application topically. Apply to affected area  once- for port) 30 g 3  . lisinopril (PRINIVIL,ZESTRIL) 5 MG tablet TAKE 1 TABLET BY MOUTH EVERY DAY 90 tablet 3  . LORazepam (ATIVAN) 0.5 MG tablet Take 1 tablet (0.5 mg total) by mouth at bedtime as needed (Nausea or vomiting). 30 tablet 0  . ondansetron (ZOFRAN) 8 MG tablet Take 1 tablet (8 mg total) by mouth 2 (two) times daily as needed (Nausea or vomiting). Begin 4 days after chemotherapy. 30 tablet 1  . oxyCODONE-acetaminophen (PERCOCET/ROXICET) 5-325 MG tablet Take 1 tablet by mouth every 8 (eight) hours as needed for severe pain. 30 tablet 0  . PARoxetine (PAXIL) 20 MG tablet Take 1 tablet (20 mg total) by mouth daily. 90 tablet 3  . prochlorperazine (COMPAZINE) 10 MG tablet TAKE 1 TABLET (10 MG TOTAL) BY MOUTH EVERY 6 (SIX) HOURS AS NEEDED (NAUSEA OR VOMITING). 30 tablet 1  . traMADol (ULTRAM) 50 MG tablet TAKE 1 TABLET BY MOUTH TWICE A DAY AS NEEDED 60 tablet 3  . VENTOLIN HFA 108 (90 Base) MCG/ACT inhaler Inhale 1-2 puffs into the lungs every 6 (six) hours as needed for wheezing or shortness of breath. 1 Inhaler 3   No current facility-administered medications for this visit.     PHYSICAL EXAMINATION: ECOG PERFORMANCE STATUS: 1 - Symptomatic but completely ambulatory  Vitals:   03/27/19 1047  BP: 138/76  Pulse: 91  Resp: 18  Temp: 98.9 F (37.2 C)  SpO2: 98%   Filed Weights   03/27/19 1047  Weight: 133 lb 11.2 oz (60.6 kg)    GENERAL: alert, no distress and comfortable SKIN: skin color, texture, turgor are normal, no rashes or significant lesions EYES: normal, Conjunctiva are pink and non-injected, sclera clear OROPHARYNX: no exudate, no erythema and lips, buccal mucosa, and tongue normal  NECK: supple, thyroid normal size, non-tender, without nodularity LYMPH: no palpable lymphadenopathy in the cervical, axillary or inguinal LUNGS: clear to auscultation and percussion with normal breathing effort HEART: regular rate & rhythm and no murmurs and no lower extremity  edema ABDOMEN: abdomen soft, non-tender and normal bowel sounds MUSCULOSKELETAL: no cyanosis of digits and no clubbing  NEURO: alert & oriented x 3 with fluent speech, no focal motor/sensory deficits EXTREMITIES: No lower extremity edema  LABORATORY DATA:  I have reviewed the data as listed CMP Latest Ref Rng & Units 03/06/2019 02/13/2019 01/22/2019  Glucose 70 - 99 mg/dL 93 86 130(H)  BUN 6 - 20 mg/dL _0 Creatinine 0.44 - 1.00 mg/dL 0.66 0.69 0.79  Sodium 135 - 145 mmol/L 143 142 138  Potassium 3.5 - 5.1 mmol/L 3.3(L) 3.8 3.9  Chloride 98 - 111 mmol/L 106 105 101  CO2 22 - 32 mmol/L _1 Calcium 8.9 - 10.3 mg/dL 8.4(L) 7.9(L) 8.7(L)  Total Protein 6.5 - 8.1 g/dL 5.8(L) 5.6(L) 6.2(L)  Total Bilirubin 0.3 - 1.2 mg/dL 0.3 <0.2(L) 0.3  Alkaline Phos 38 - 126 U/L 84 84 96  AST 15 - 41 U/L 30 46(H) 34  ALT 0 - 44 U/L 66(H) 77(H) 88(H)    Lab Results  Component Value Date   WBC  7.2 03/27/2019   HGB 10.1 (L) 03/27/2019   HCT 31.5 (L) 03/27/2019   MCV 106.4 (H) 03/27/2019   PLT 442 (H) 03/27/2019   NEUTROABS 6.0 03/27/2019    ASSESSMENT & PLAN:  Malignant neoplasm of upper-outer quadrant of right breast in female, estrogen receptor positive ([place]) 11/21/2018:Screening detected right breast mass upper outer quadrant, in addition to areas of calcifications which were not biopsied. By ultrasound she had 2 breast masses 12 o'clock position 2.9 cm: Grade 2 IDC with high-grade DCIS ER 100%, PR 70%, Ki-67 10%, HER-2 3+ by IHC and 11 o'clock position 1 cm, ER 90%, PR 50%, Ki-67 15%, HER-2 3+ by IHC, T2N0 stage Ib  Recommendation: 1. Neoadjuvant chemotherapy with TCH Perjeta 6 cycles followed by Herceptinand Perjeta versus Kadcylamaintenance for 1 year 2. Followed bymastectomy versusbreast conserving surgery with sentinel lymph node study 3. Followed by adjuvant radiation therapy if patient had lumpectomy 4.Followed by adjuvant antiestrogen therapy  Breast MRI 12/03/2018:  2.3 cm enhancing mass 4 o'clock position right breast, 11 o'clock position biopsy-proven malignancy area did not show any discrete mass -------------------------------------------------------------------------------------------------------------------------------------------------------- Current treatment: [place] Perjetastarted2/19/2020 Labs reviewed Echocardiogram: 12/06/2018: EF greater than 65%  Chemo toxicities: 1.Bone pain especially low back pain as result of Neulasta: Improved with Claritin 2.Diarrhea: Well controlled with Imodium 3.Nausea: Moderate 4.Migraine headache: Improved with Excedrin 5.Fatigue due to chemotherapy 6. Syncope: 3/26-3/28: Syncope Hospitalization, encouraged her to increase fluid intake and not drink alcohol. 7. Monitoring her LFTs. Ok to treat with ALT 88  Breast MRI has been ordered Surgeon appointment also made We will discuss her in the tumor board  I will see her 1 week after surgery with a video visit to discuss the final pathology and determine the maintenance therapy plan between Herceptin Perjeta versus Kadcyla.    No orders of the defined types were placed in this encounter.  The patient has a good understanding of the overall plan. she agrees with it. she will call with any problems that may develop before the next visit here.  Nicholas Lose, MD 03/27/2019  Julious Oka Dorshimer am acting as scribe for Dr. Nicholas Lose.  I have reviewed the above documentation for accuracy and completeness, and I agree with the above.

## 2019-03-27 ENCOUNTER — Inpatient Hospital Stay: Payer: Medicare HMO

## 2019-03-27 ENCOUNTER — Telehealth: Payer: Self-pay | Admitting: *Deleted

## 2019-03-27 ENCOUNTER — Inpatient Hospital Stay: Payer: Medicare HMO | Attending: Hematology and Oncology

## 2019-03-27 ENCOUNTER — Inpatient Hospital Stay (HOSPITAL_BASED_OUTPATIENT_CLINIC_OR_DEPARTMENT_OTHER): Payer: Medicare HMO | Admitting: Hematology and Oncology

## 2019-03-27 ENCOUNTER — Other Ambulatory Visit: Payer: Self-pay

## 2019-03-27 DIAGNOSIS — Z5112 Encounter for antineoplastic immunotherapy: Secondary | ICD-10-CM | POA: Diagnosis not present

## 2019-03-27 DIAGNOSIS — Z5111 Encounter for antineoplastic chemotherapy: Secondary | ICD-10-CM | POA: Insufficient documentation

## 2019-03-27 DIAGNOSIS — C50411 Malignant neoplasm of upper-outer quadrant of right female breast: Secondary | ICD-10-CM

## 2019-03-27 DIAGNOSIS — Z95828 Presence of other vascular implants and grafts: Secondary | ICD-10-CM

## 2019-03-27 DIAGNOSIS — M898X9 Other specified disorders of bone, unspecified site: Secondary | ICD-10-CM | POA: Diagnosis not present

## 2019-03-27 DIAGNOSIS — Z170 Estrogen receptor positive status [ER+]: Secondary | ICD-10-CM | POA: Diagnosis not present

## 2019-03-27 DIAGNOSIS — Z5189 Encounter for other specified aftercare: Secondary | ICD-10-CM | POA: Insufficient documentation

## 2019-03-27 LAB — CMP (CANCER CENTER ONLY)
ALT: 36 U/L (ref 0–44)
AST: 25 U/L (ref 15–41)
Albumin: 3.4 g/dL — ABNORMAL LOW (ref 3.5–5.0)
Alkaline Phosphatase: 95 U/L (ref 38–126)
Anion gap: 9 (ref 5–15)
BUN: 14 mg/dL (ref 6–20)
CO2: 25 mmol/L (ref 22–32)
Calcium: 8.4 mg/dL — ABNORMAL LOW (ref 8.9–10.3)
Chloride: 105 mmol/L (ref 98–111)
Creatinine: 0.66 mg/dL (ref 0.44–1.00)
GFR, Est AFR Am: >60 mL/min (ref >60)
GFR, Estimated: >60 mL/min (ref >60)
Glucose, Bld: 125 mg/dL — ABNORMAL HIGH (ref 70–99)
Potassium: 4.3 mmol/L (ref 3.5–5.1)
Sodium: 139 mmol/L (ref 135–145)
Total Bilirubin: <0.2 mg/dL — ABNORMAL LOW (ref 0.3–1.2)
Total Protein: 6.1 g/dL — ABNORMAL LOW (ref 6.5–8.1)

## 2019-03-27 LAB — CBC WITH DIFFERENTIAL (CANCER CENTER ONLY)
Abs Immature Granulocytes: 0.03 10*3/uL (ref 0.00–0.07)
Basophils Absolute: 0 10*3/uL (ref 0.0–0.1)
Basophils Relative: 0 %
Eosinophils Absolute: 0 10*3/uL (ref 0.0–0.5)
Eosinophils Relative: 0 %
HCT: 31.5 % — ABNORMAL LOW (ref 36.0–46.0)
Hemoglobin: 10.1 g/dL — ABNORMAL LOW (ref 12.0–15.0)
Immature Granulocytes: 0 %
Lymphocytes Relative: 8 %
Lymphs Abs: 0.6 10*3/uL — ABNORMAL LOW (ref 0.7–4.0)
MCH: 34.1 pg — ABNORMAL HIGH (ref 26.0–34.0)
MCHC: 32.1 g/dL (ref 30.0–36.0)
MCV: 106.4 fL — ABNORMAL HIGH (ref 80.0–100.0)
Monocytes Absolute: 0.6 10*3/uL (ref 0.1–1.0)
Monocytes Relative: 9 %
Neutro Abs: 6 10*3/uL (ref 1.7–7.7)
Neutrophils Relative %: 83 %
Platelet Count: 442 10*3/uL — ABNORMAL HIGH (ref 150–400)
RBC: 2.96 MIL/uL — ABNORMAL LOW (ref 3.87–5.11)
RDW: 18.9 % — ABNORMAL HIGH (ref 11.5–15.5)
WBC Count: 7.2 10*3/uL (ref 4.0–10.5)
nRBC: 0 % (ref 0.0–0.2)

## 2019-03-27 MED ORDER — SODIUM CHLORIDE 0.9 % IV SOLN
Freq: Once | INTRAVENOUS | Status: AC
  Administered 2019-03-27: 12:00:00 via INTRAVENOUS
  Filled 2019-03-27: qty 250

## 2019-03-27 MED ORDER — SODIUM CHLORIDE 0.9 % IV SOLN
Freq: Once | INTRAVENOUS | Status: AC
  Administered 2019-03-27: 12:00:00 via INTRAVENOUS
  Filled 2019-03-27: qty 5

## 2019-03-27 MED ORDER — TRASTUZUMAB CHEMO 150 MG IV SOLR
6.0000 mg/kg | Freq: Once | INTRAVENOUS | Status: AC
  Administered 2019-03-27: 378 mg via INTRAVENOUS
  Filled 2019-03-27: qty 18

## 2019-03-27 MED ORDER — SODIUM CHLORIDE 0.9 % IV SOLN
618.6000 mg | Freq: Once | INTRAVENOUS | Status: AC
  Administered 2019-03-27: 620 mg via INTRAVENOUS
  Filled 2019-03-27: qty 62

## 2019-03-27 MED ORDER — ACETAMINOPHEN 325 MG PO TABS
650.0000 mg | ORAL_TABLET | Freq: Once | ORAL | Status: AC
  Administered 2019-03-27: 650 mg via ORAL

## 2019-03-27 MED ORDER — DIPHENHYDRAMINE HCL 25 MG PO CAPS
50.0000 mg | ORAL_CAPSULE | Freq: Once | ORAL | Status: AC
  Administered 2019-03-27: 50 mg via ORAL

## 2019-03-27 MED ORDER — DIPHENHYDRAMINE HCL 25 MG PO CAPS
ORAL_CAPSULE | ORAL | Status: AC
  Filled 2019-03-27: qty 1

## 2019-03-27 MED ORDER — ACETAMINOPHEN 325 MG PO TABS
ORAL_TABLET | ORAL | Status: AC
  Filled 2019-03-27: qty 2

## 2019-03-27 MED ORDER — PALONOSETRON HCL INJECTION 0.25 MG/5ML
0.2500 mg | Freq: Once | INTRAVENOUS | Status: AC
  Administered 2019-03-27: 0.25 mg via INTRAVENOUS

## 2019-03-27 MED ORDER — PALONOSETRON HCL INJECTION 0.25 MG/5ML
INTRAVENOUS | Status: AC
  Filled 2019-03-27: qty 5

## 2019-03-27 MED ORDER — SODIUM CHLORIDE 0.9% FLUSH
10.0000 mL | INTRAVENOUS | Status: DC | PRN
  Administered 2019-03-27: 10 mL
  Filled 2019-03-27: qty 10

## 2019-03-27 MED ORDER — SODIUM CHLORIDE 0.9 % IV SOLN
420.0000 mg | Freq: Once | INTRAVENOUS | Status: AC
  Administered 2019-03-27: 420 mg via INTRAVENOUS
  Filled 2019-03-27: qty 14

## 2019-03-27 MED ORDER — SODIUM CHLORIDE 0.9 % IV SOLN
75.0000 mg/m2 | Freq: Once | INTRAVENOUS | Status: AC
  Administered 2019-03-27: 120 mg via INTRAVENOUS
  Filled 2019-03-27: qty 12

## 2019-03-27 MED ORDER — SODIUM CHLORIDE 0.9% FLUSH
10.0000 mL | Freq: Once | INTRAVENOUS | Status: AC
  Administered 2019-03-27: 10 mL
  Filled 2019-03-27: qty 10

## 2019-03-27 MED ORDER — HEPARIN SOD (PORK) LOCK FLUSH 100 UNIT/ML IV SOLN
500.0000 [IU] | Freq: Once | INTRAVENOUS | Status: AC | PRN
  Administered 2019-03-27: 16:00:00 500 [IU]
  Filled 2019-03-27: qty 5

## 2019-03-27 NOTE — Telephone Encounter (Signed)
Called pt to congratulate on completion of final TCHP later today. Denies questions or needs at this time. Encourage pt to call with concerns. Received verbal understanding.

## 2019-03-27 NOTE — Patient Instructions (Signed)
[facility] Discharge Instructions for Patients Receiving Chemotherapy  Today you received the following chemotherapy agents: Herceptin, Perjeta, Taxotere, Carboplatin.  To help prevent nausea and vomiting after your treatment, we encourage you to take your nausea medication as directed.   If you develop nausea and vomiting that is not controlled by your nausea medication, call the clinic.   BELOW ARE SYMPTOMS THAT SHOULD BE REPORTED IMMEDIATELY:  *FEVER GREATER THAN 100.5 F  *CHILLS WITH OR WITHOUT FEVER  NAUSEA AND VOMITING THAT IS NOT CONTROLLED WITH YOUR NAUSEA MEDICATION  *UNUSUAL SHORTNESS OF BREATH  *UNUSUAL BRUISING OR BLEEDING  TENDERNESS IN MOUTH AND THROAT WITH OR WITHOUT PRESENCE OF ULCERS  *URINARY PROBLEMS  *BOWEL PROBLEMS  UNUSUAL RASH Items with * indicate a potential emergency and should be followed up as soon as possible.  Feel free to call the clinic should you have any questions or concerns. The clinic phone number is [phone].  Please show the CHEMO ALERT CARD at check-in to the Emergency Department and triage nurse.   

## 2019-03-27 NOTE — Assessment & Plan Note (Signed)
11/21/2018:Screening detected right breast mass upper outer quadrant, in addition to areas of calcifications which were not biopsied. By ultrasound she had 2 breast masses 12 o'clock position 2.9 cm: Grade 2 IDC with high-grade DCIS ER 100%, PR 70%, Ki-67 10%, HER-2 3+ by IHC and 11 o'clock position 1 cm, ER 90%, PR 50%, Ki-67 15%, HER-2 3+ by IHC, T2N0 stage Ib  Recommendation: 1. Neoadjuvant chemotherapy with TCH Perjeta 6 cycles followed by Herceptinand Perjeta versus Kadcylamaintenance for 1 year 2. Followed bymastectomy versusbreast conserving surgery with sentinel lymph node study 3. Followed by adjuvant radiation therapy if patient had lumpectomy 4.Followed by adjuvant antiestrogen therapy  Breast MRI 12/03/2018: 2.3 cm enhancing mass 4 o'clock position right breast, 11 o'clock position biopsy-proven malignancy area did not show any discrete mass -------------------------------------------------------------------------------------------------------------------------------------------------------- Current treatment: [place] Perjetastarted2/19/2020 Labs reviewed Echocardiogram: 12/06/2018: EF greater than 65%  Chemo toxicities: 1.Bone pain especially low back pain as result of Neulasta: Improved with Claritin 2.Diarrhea: Well controlled with Imodium 3.Nausea: Moderate 4.Migraine headache: Improved with Excedrin 5.Fatigue due to chemotherapy 6. Syncope: 3/26-3/28: Syncope Hospitalization, encouraged her to increase fluid intake and not drink alcohol. 7. Monitoring her LFTs. Ok to treat with ALT 88  Breast MRI has been ordered Surgeon appointment also made We will discuss her in the tumor board  I will see her 1 week after surgery with a video visit to discuss the final pathology and determine the maintenance therapy plan between Herceptin Perjeta versus Kadcyla.

## 2019-03-28 ENCOUNTER — Ambulatory Visit
Admission: RE | Admit: 2019-03-28 | Discharge: 2019-03-28 | Disposition: A | Discharge status: Home / Self Care | Payer: Medicare HMO | Source: Ambulatory Visit | Attending: Hematology and Oncology | Admitting: Hematology and Oncology

## 2019-03-28 DIAGNOSIS — C50411 Malignant neoplasm of upper-outer quadrant of right female breast: Secondary | ICD-10-CM

## 2019-03-28 DIAGNOSIS — Z5111 Encounter for antineoplastic chemotherapy: Secondary | ICD-10-CM | POA: Diagnosis not present

## 2019-03-28 DIAGNOSIS — N6321 Unspecified lump in the left breast, upper outer quadrant: Secondary | ICD-10-CM | POA: Diagnosis not present

## 2019-03-28 IMAGING — MR MR BILATERAL BREAST WITHOUT AND WITH CONTRAST
11 of 16 series · 29 of 48 positions shown · IV contrast (6 ml gadavist)
Comparison: Previous exam(s).  Prior breast MRI, [DATE].

CLINICAL DATA: Follow-up neoadjuvant chemotherapy for right breast
carcinoma. Patient diagnosed with carcinoma in the right breast,
presenting as a palpable mass. This carcinoma was located in the
11-12 o'clock position the right breast.

LABS:  Creatinine was obtained on site at [HOSPITAL] at [REDACTED] [HOSPITAL].
Results: Creatinine 0.6 mg/dL.  GFR 102
EXAM:
BILATERAL BREAST MRI WITH AND WITHOUT CONTRAST
TECHNIQUE: Multiplanar, multisequence MR images of both breasts were obtained
prior to and following the intravenous administration of 6 ml of
Gadavist

[Series 2: t2_tirm_tra ipat (a-p) · axial · 3.0mm · 0.64mm/px · 1 of 60 slices shown]
[im 1/60]
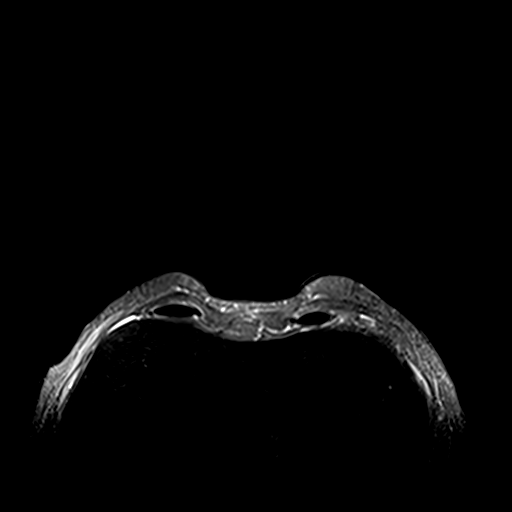

[Series 3: fl3d pre-cm no · axial · non-contrast · 0.9mm · 0.86mm/px · z∈[-63,+95]mm · 3 of 176 slices shown]
[im 1/176]
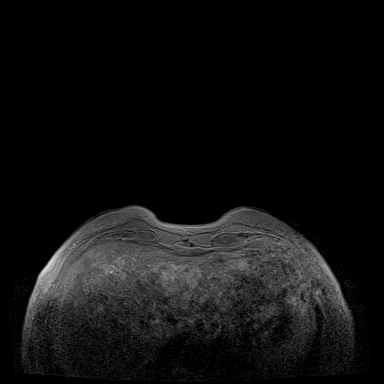
[im 88/176]
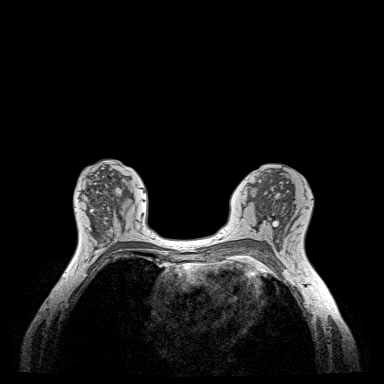
[im 176/176]
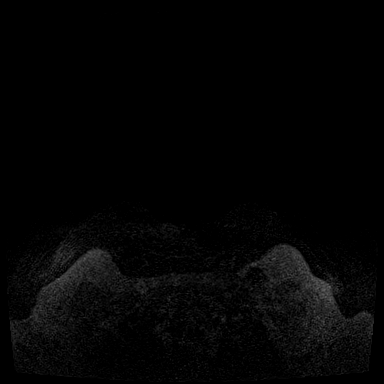

[Series 4: fl3d pre-cm · axial · non-contrast · 0.9mm · 0.79mm/px · z∈[-63,+95]mm · 3 of 176 slices shown]
[im 1/176]
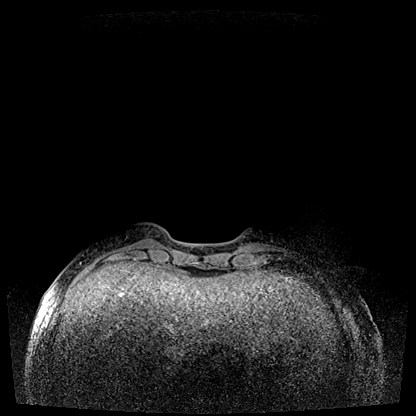
[im 88/176]
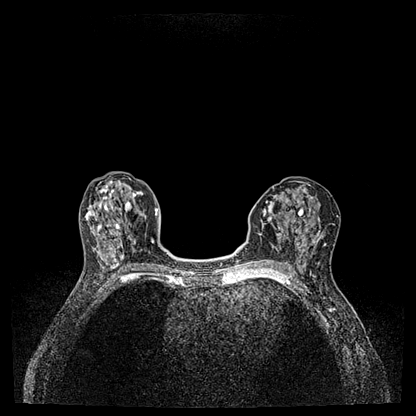
[im 176/176]
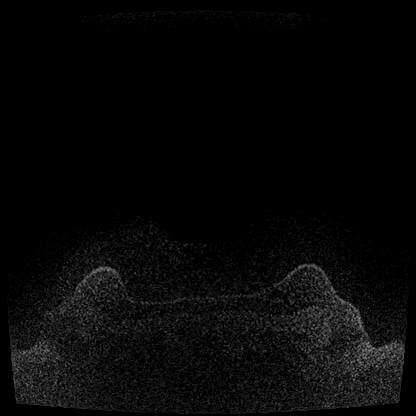

[Series 5: fl3d post-cm 20 · axial · 0.9mm · 0.79mm/px · z∈[-63,+95]mm · 3 of 176 slices shown (1 of 3)]
[im 1/176]
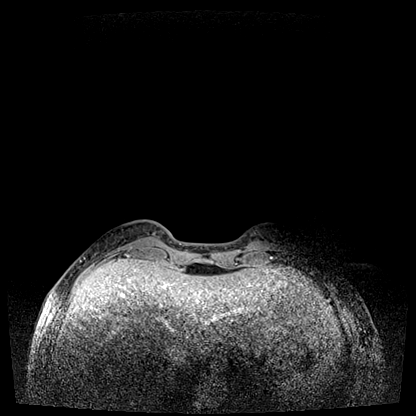
[im 88/176]
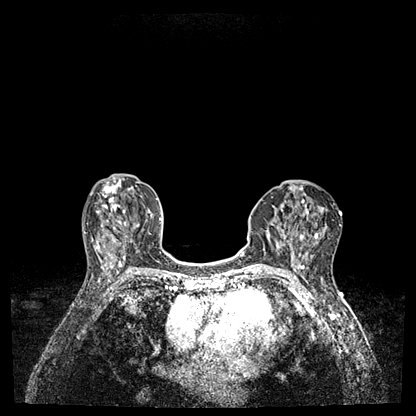
[im 176/176]
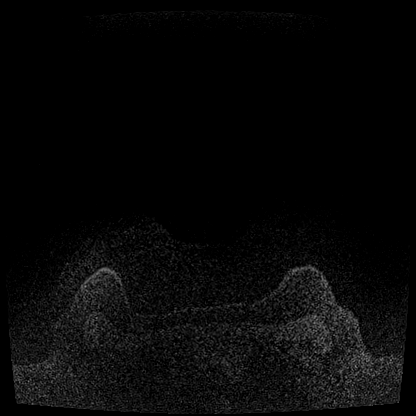

[Series 6: fl3d post-cm 20 · axial · 0.9mm · 0.79mm/px · z∈[-63,+95]mm · 3 of 176 slices shown (2 of 3)]
[im 1/176]
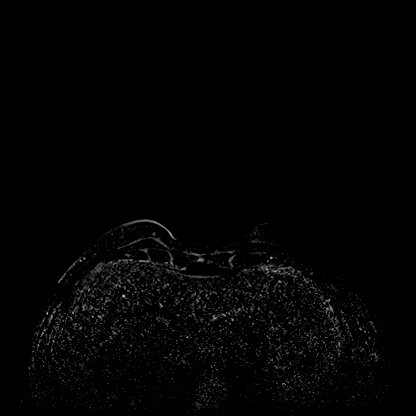
[im 88/176]
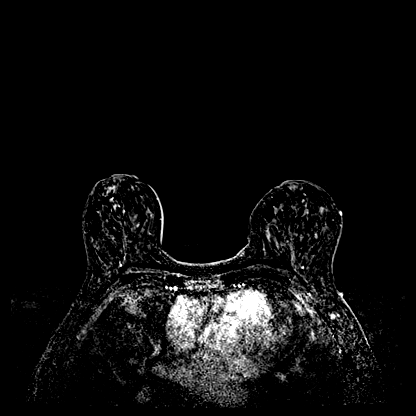
[im 176/176]
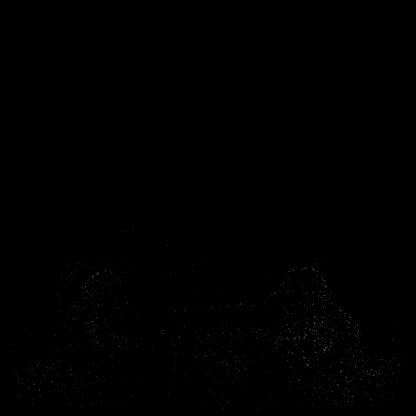

[Series 7: fl3d post-cm 20 · axial · 158.4mm · 0.79mm/px · 1 of 1 slices shown (3 of 3)]
[im 1/1]
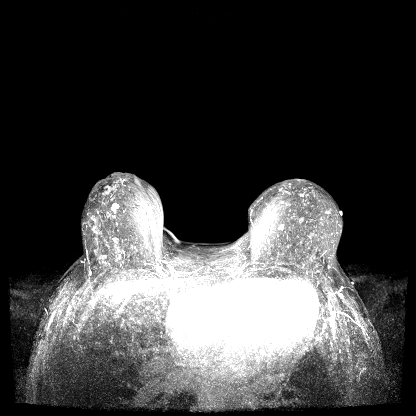

[Series 8: fl3d post-cm 3min · axial · 0.9mm · 0.79mm/px · z∈[-63,+95]mm · 3 of 176 slices shown]
[im 1/176]
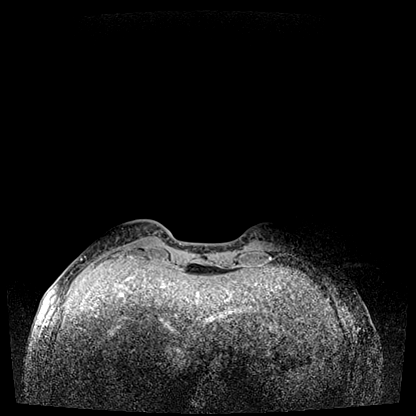
[im 88/176]
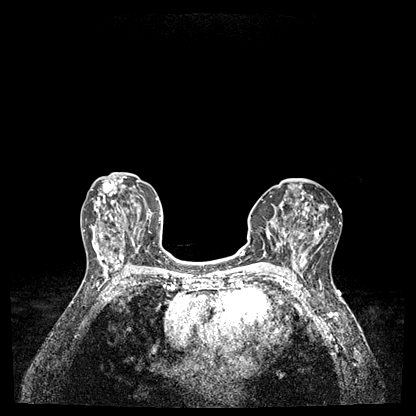
[im 176/176]
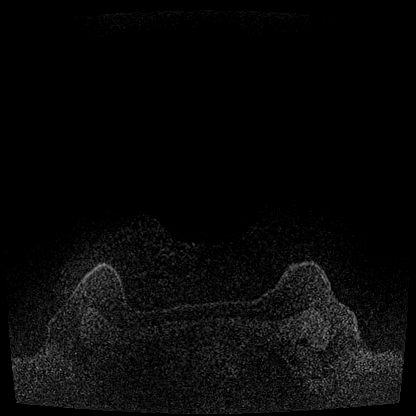

[Series 9: fl3d post-cm 3min_sub · axial · 0.9mm · 0.79mm/px · z∈[-63,+95]mm · 3 of 176 slices shown]
[im 1/176]
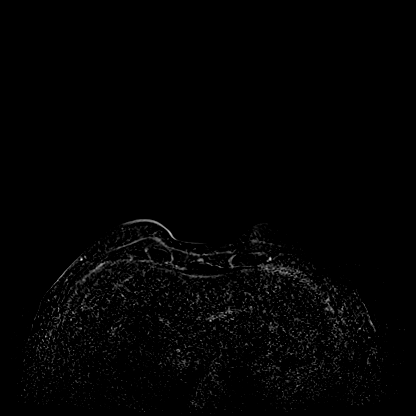
[im 88/176]
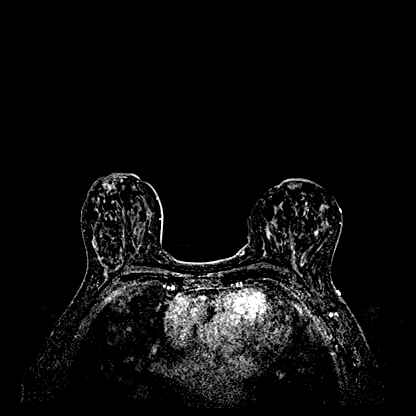
[im 176/176]
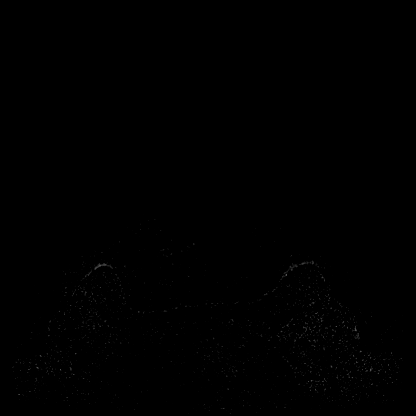

[Series 10: fl3d post-cm 3min_sub_mip_tra · axial · 158.4mm · 0.79mm/px · 1 of 1 slices shown]
[im 1/1]
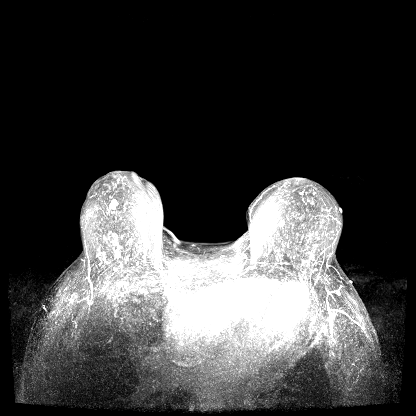

[Series 11: fl3d post-cm 5min · axial · 0.9mm · 0.79mm/px · z∈[-63,+95]mm · 4 of 176 slices shown]
[im 1/176]
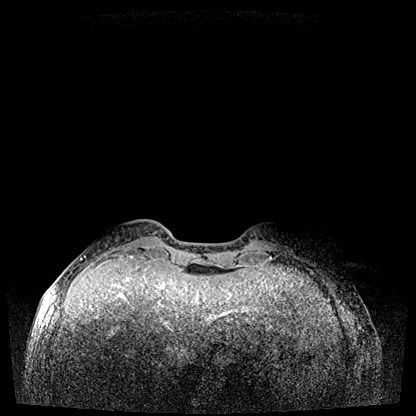
[im 59/176]
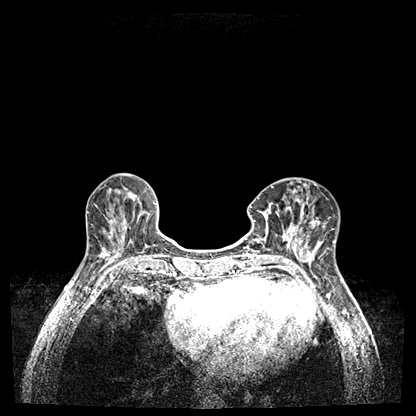
[im 117/176]
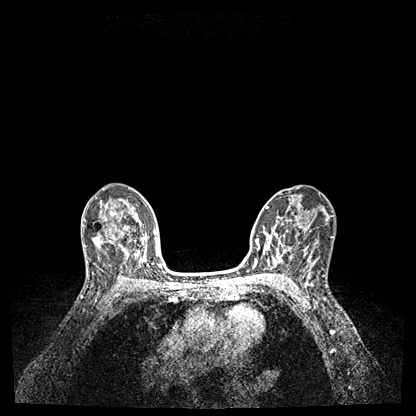
[im 176/176]
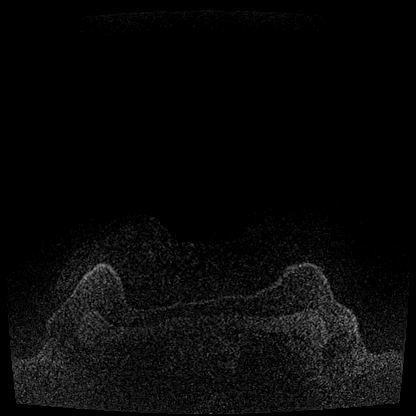

[Series 12: fl3d post-cm 5min_sub · axial · 0.9mm · 0.79mm/px · z∈[-63,+95]mm · 4 of 176 slices shown]
[im 1/176]
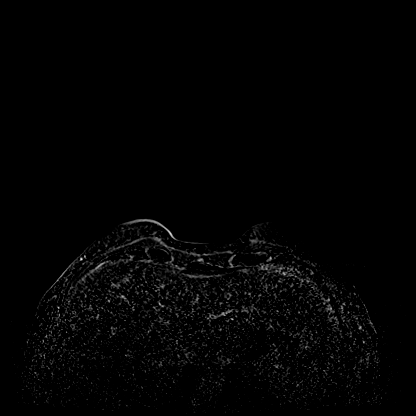
[im 59/176]
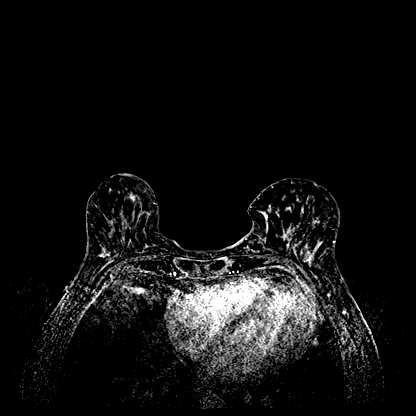
[im 117/176]
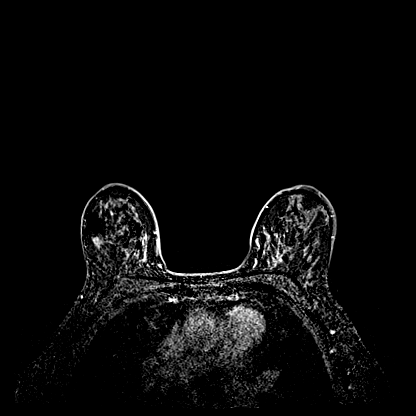
[im 176/176]
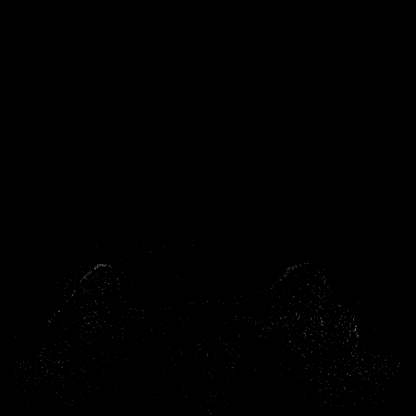

[29 of 48 positions shown; findings below may reference images not displayed]

Three-dimensional MR images were rendered by post-processing of the
original MR data on an independent workstation. The
three-dimensional MR images were interpreted, and findings are
reported in the following complete MRI report for this study. Three
dimensional images were evaluated at the independent [employer]
workstation
FINDINGS: Breast composition: c. Heterogeneous fibroglandular tissue.

Background parenchymal enhancement: Marked.

Right breast: There is susceptibility artifact from the previous
biopsy in the 12 o'clock position of the breast with low level
surrounding non mass enhancement spanning 12 x 4 mm transversely.
This represents a significant improvement compared to the previous
MRI, where enhancement spanned 2.1 x 1.7 x 2.3 cm. There are no
other areas of abnormal right breast enhancement.

Left breast: There is a solitary enhancing mass, bilobed
configuration from superior to inferior, in the left breast upper,
inner quadrant, middle depth, measuring 10 mm from superior to
inferior by 6 x 3 mm transversely. There are no other areas of
abnormal left breast enhancement.

Lymph nodes: No abnormal appearing lymph nodes.

Ancillary findings:  None.
IMPRESSION: 1. Enhancing mass in the left breast, upper outer quadrant,
measuring 10 x 3 x 6 mm. This is indeterminate.
2. Positive response to neoadjuvant chemotherapy. The 12 o'clock
position right breast mass has shown a marked interval reduction in
size. There is no evidence of new right breast carcinoma.

RECOMMENDATION:
1. MRI guided biopsy of the enhancing mass in the upper inner
quadrant of the left breast.

BI-RADS CATEGORY  4: Suspicious.

## 2019-03-28 MED ORDER — GADOBUTROL 1 MMOL/ML IV SOLN
6.0000 mL | Freq: Once | INTRAVENOUS | Status: AC | PRN
  Administered 2019-03-28: 6 mL via INTRAVENOUS

## 2019-03-29 ENCOUNTER — Inpatient Hospital Stay: Payer: Medicare HMO

## 2019-03-29 ENCOUNTER — Other Ambulatory Visit: Payer: Self-pay

## 2019-03-29 VITALS — BP 138/72 | HR 78 | Temp 98.2°F | Resp 17

## 2019-03-29 DIAGNOSIS — Z5111 Encounter for antineoplastic chemotherapy: Secondary | ICD-10-CM | POA: Diagnosis not present

## 2019-03-29 DIAGNOSIS — C50411 Malignant neoplasm of upper-outer quadrant of right female breast: Secondary | ICD-10-CM

## 2019-03-29 DIAGNOSIS — Z5189 Encounter for other specified aftercare: Secondary | ICD-10-CM | POA: Diagnosis not present

## 2019-03-29 DIAGNOSIS — Z5112 Encounter for antineoplastic immunotherapy: Secondary | ICD-10-CM | POA: Diagnosis not present

## 2019-03-29 DIAGNOSIS — Z170 Estrogen receptor positive status [ER+]: Secondary | ICD-10-CM

## 2019-03-29 MED ORDER — PEGFILGRASTIM-CBQV 6 MG/0.6ML ~~LOC~~ SOSY
6.0000 mg | PREFILLED_SYRINGE | Freq: Once | SUBCUTANEOUS | Status: AC
  Administered 2019-03-29: 15:00:00 6 mg via SUBCUTANEOUS

## 2019-03-29 MED ORDER — PEGFILGRASTIM-CBQV 6 MG/0.6ML ~~LOC~~ SOSY
PREFILLED_SYRINGE | SUBCUTANEOUS | Status: AC
  Filled 2019-03-29: qty 0.6

## 2019-03-30 ENCOUNTER — Other Ambulatory Visit: Payer: Self-pay | Admitting: Hematology and Oncology

## 2019-03-30 DIAGNOSIS — C50911 Malignant neoplasm of unspecified site of right female breast: Secondary | ICD-10-CM | POA: Diagnosis not present

## 2019-03-30 DIAGNOSIS — Z170 Estrogen receptor positive status [ER+]: Secondary | ICD-10-CM | POA: Diagnosis not present

## 2019-03-30 DIAGNOSIS — C50411 Malignant neoplasm of upper-outer quadrant of right female breast: Secondary | ICD-10-CM

## 2019-04-03 ENCOUNTER — Telehealth: Payer: Self-pay | Admitting: *Deleted

## 2019-04-03 NOTE — Telephone Encounter (Signed)
Received call from pt stating she was experiencing bone pain and increased fatigue since last infusion on 03/27/2019.  Pt states her nausea is being controled with p.o zofran and diarrhea is controlled with imodium.  Educated pt on increasing fluids with water and Gatorade and taking Claritin to help with bone pain from neulasta injection.  Also educated pt that since that was her last treatment, she may feel more fatigued due to the accumulation of treatments and that it would take some time for it to resolve and to rest when she feels she needs to rest.  Pt verbalized understanding and will call if her symptoms worsen.

## 2019-04-05 ENCOUNTER — Other Ambulatory Visit: Payer: Self-pay | Admitting: General Surgery

## 2019-04-05 ENCOUNTER — Other Ambulatory Visit: Payer: Self-pay | Admitting: Hematology and Oncology

## 2019-04-05 DIAGNOSIS — C50411 Malignant neoplasm of upper-outer quadrant of right female breast: Secondary | ICD-10-CM

## 2019-04-06 ENCOUNTER — Other Ambulatory Visit: Payer: Self-pay | Admitting: General Surgery

## 2019-04-06 ENCOUNTER — Telehealth: Payer: Self-pay | Admitting: *Deleted

## 2019-04-06 DIAGNOSIS — R9389 Abnormal findings on diagnostic imaging of other specified body structures: Secondary | ICD-10-CM

## 2019-04-06 NOTE — Telephone Encounter (Signed)
Scheduled and confirmed appt for MRI bx of left breast on 6/15 with arrival at 8am. Denies further questions or needs. Pt relate she is feeling "much better now". No new problems voiced

## 2019-04-09 ENCOUNTER — Other Ambulatory Visit: Payer: Self-pay

## 2019-04-09 ENCOUNTER — Ambulatory Visit
Admission: RE | Admit: 2019-04-09 | Discharge: 2019-04-09 | Disposition: A | Discharge status: Home / Self Care | Payer: Medicare HMO | Source: Ambulatory Visit | Attending: General Surgery | Admitting: General Surgery

## 2019-04-09 ENCOUNTER — Ambulatory Visit: Payer: Medicare HMO

## 2019-04-09 ENCOUNTER — Other Ambulatory Visit: Payer: Self-pay | Admitting: General Surgery

## 2019-04-09 DIAGNOSIS — R9389 Abnormal findings on diagnostic imaging of other specified body structures: Secondary | ICD-10-CM

## 2019-04-09 DIAGNOSIS — N6321 Unspecified lump in the left breast, upper outer quadrant: Secondary | ICD-10-CM | POA: Diagnosis not present

## 2019-04-09 IMAGING — MR MR [PERSON_NAME] BREAST WITHOUT AND WITH CONTRAST
6 of 8 series · 32 of 48 positions shown · IV contrast (gadavist)
Comparison: [DATE] and [DATE]
COMPARISON: [DATE] and [DATE]

Addendum:
CLINICAL DATA: The patient presents today for MR guided core biopsy
of mass in the UPPER INNER QUADRANT of the LEFT breast detected on
recent MRI exam. The patient is undergoing neoadjuvant treatment of
known RIGHT breast cancer in the 11-12 o'clock location.

LABS:  None obtained at the time of imaging.
EXAM:
MR OF THE LEFT BREAST WITH AND WITHOUT CONTRAST
TECHNIQUE: Multiplanar multisequence MR images of the left breast were obtained
prior to and following the intravenous administration of 6 ml of
Gadavist.

[Series 2: fiducial unilateral · sagittal · 2.0mm · 1.33mm/px · 1 of 52 slices shown]
[im 1/52]
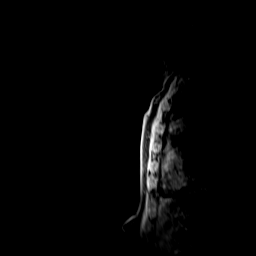

[Series 3: dynamic pre · axial · non-contrast · 1.3mm · 0.73mm/px · z∈[-103,+83]mm · 6 of 144 slices shown]
[im 1/144]
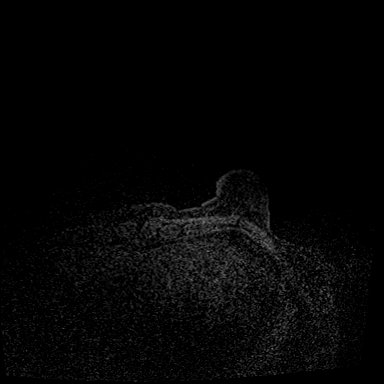
[im 29/144]
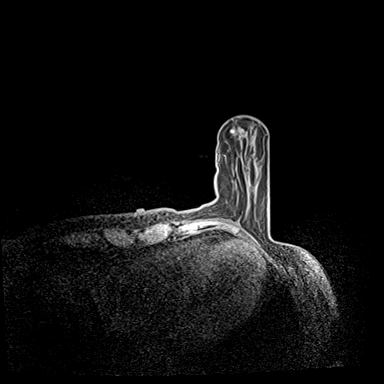
[im 58/144]
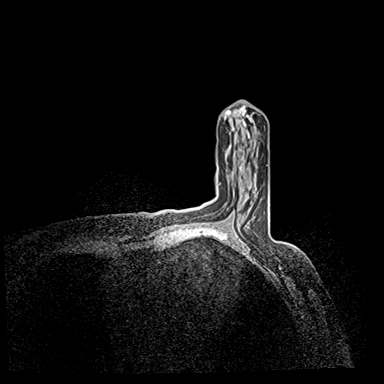
[im 86/144]
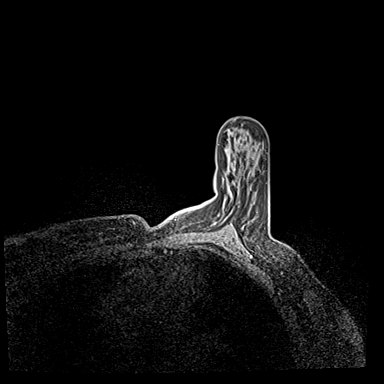
[im 115/144]
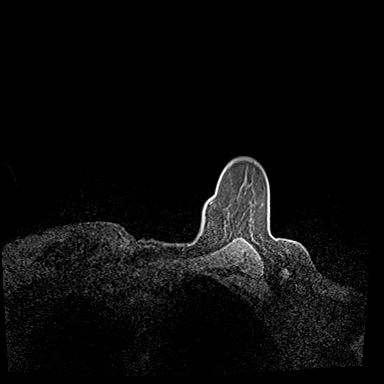
[im 144/144]
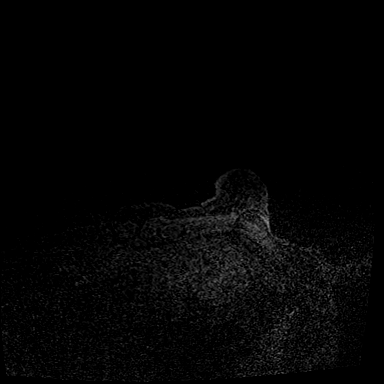

[Series 4: dynamic post 20 · axial · 1.3mm · 0.73mm/px · z∈[-103,+83]mm · 6 of 144 slices shown (1 of 2)]
[im 1/144]
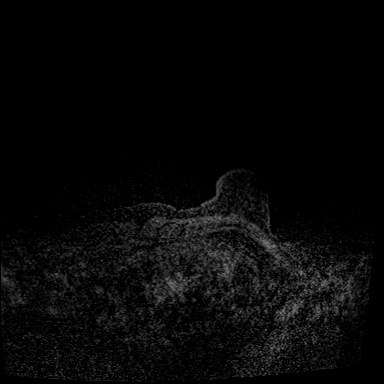
[im 29/144]
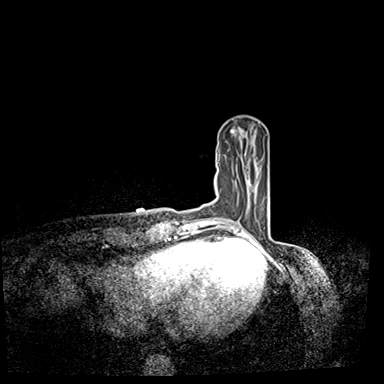
[im 58/144]
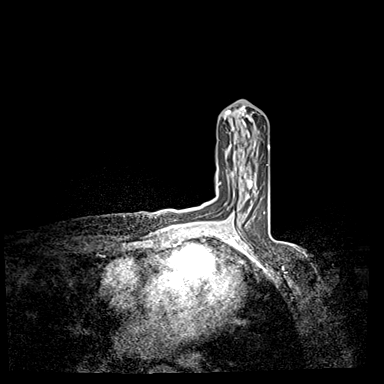
[im 86/144]
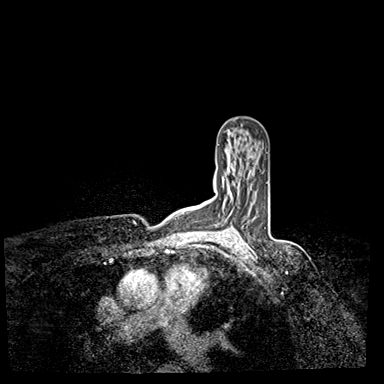
[im 115/144]
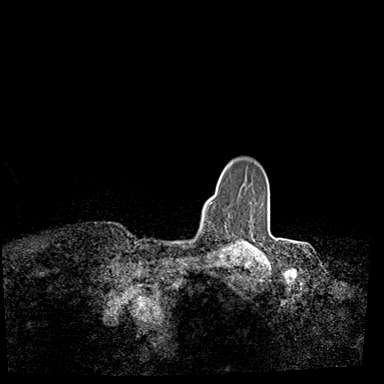
[im 144/144]
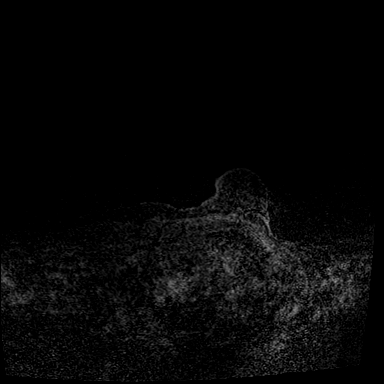

[Series 5: dynamic post 20 · axial · 1.3mm · 0.73mm/px · z∈[-103,+83]mm · 7 of 144 slices shown (2 of 2)]
[im 1/144]
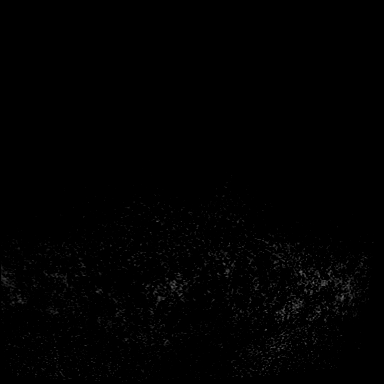
[im 24/144]
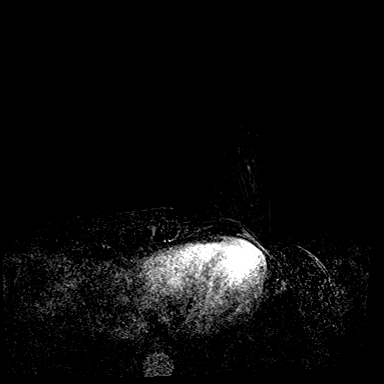
[im 48/144]
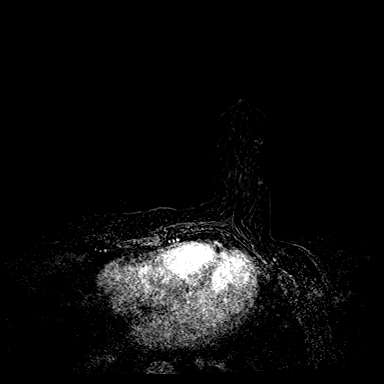
[im 72/144]
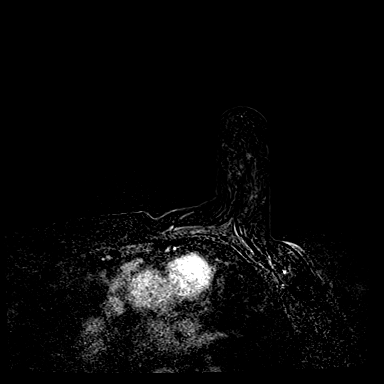
[im 96/144]
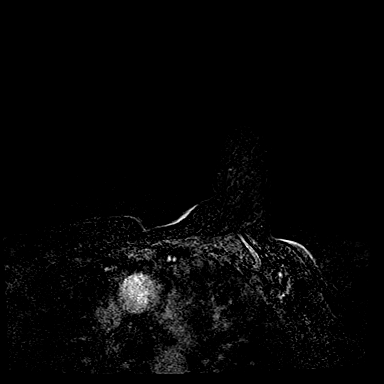
[im 120/144]
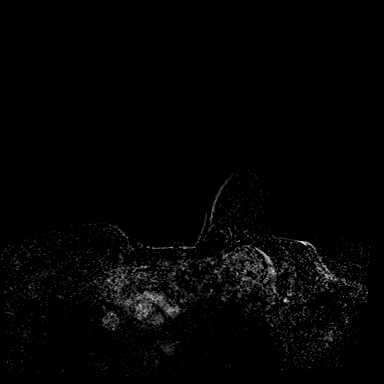
[im 144/144]
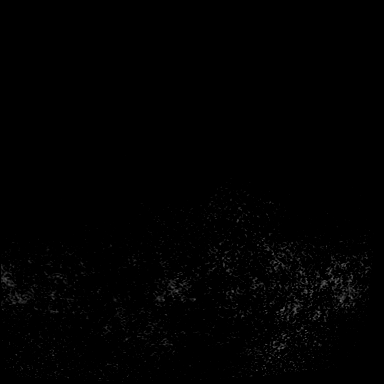

[Series 6: dynamic post 3 · axial · 1.3mm · 0.73mm/px · z∈[-103,+83]mm · 7 of 144 slices shown (1 of 2)]
[im 1/144]
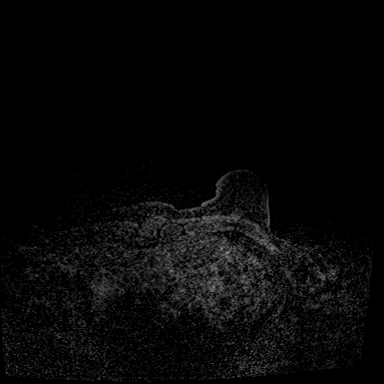
[im 24/144]
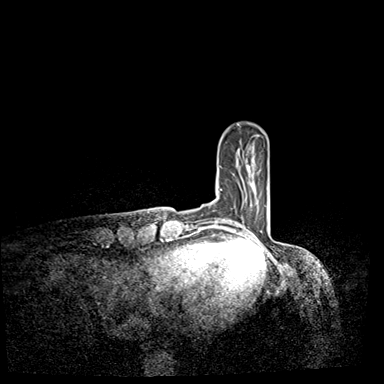
[im 48/144]
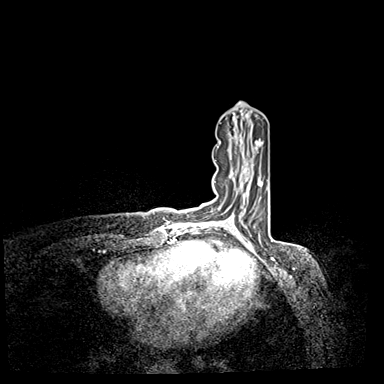
[im 72/144]
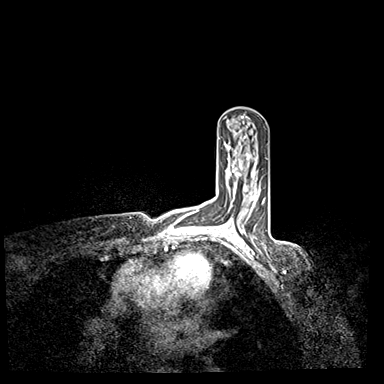
[im 96/144]
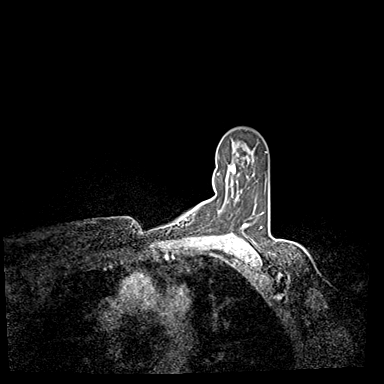
[im 120/144]
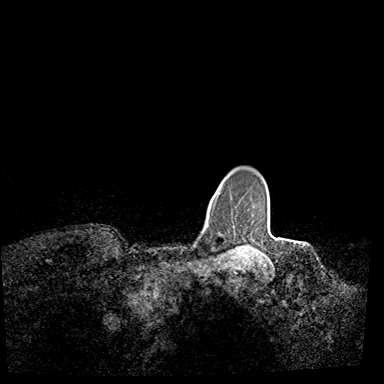
[im 144/144]
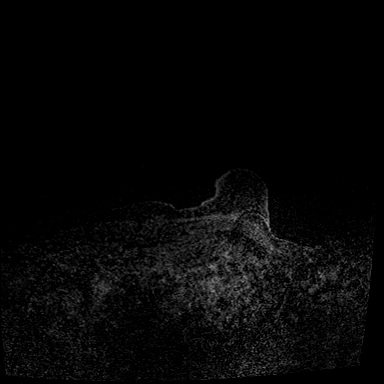

[Series 7: dynamic post 3 · axial · 1.3mm · 0.73mm/px · z∈[-103,+20]mm · 5 of 144 slices shown (2 of 2)]
[im 1/144]
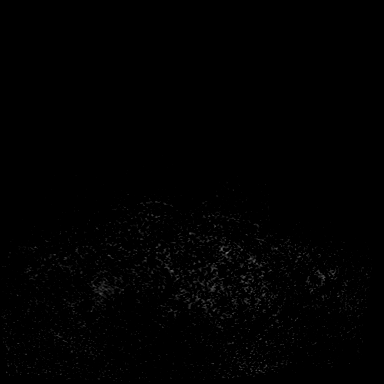
[im 24/144]
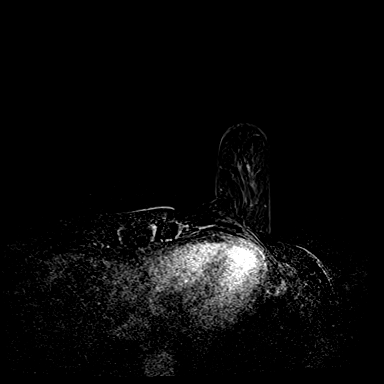
[im 48/144]
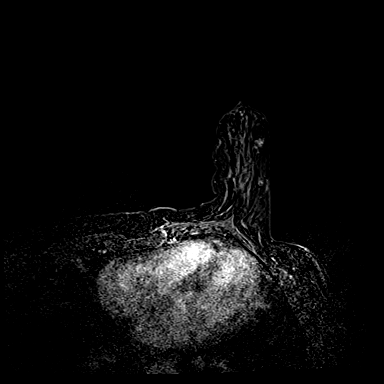
[im 72/144]
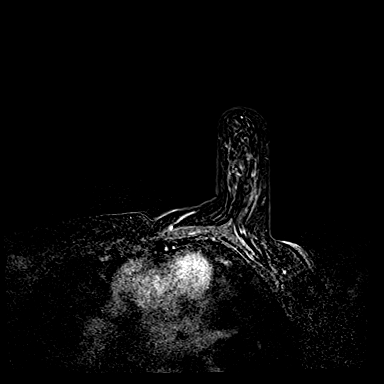
[im 96/144]
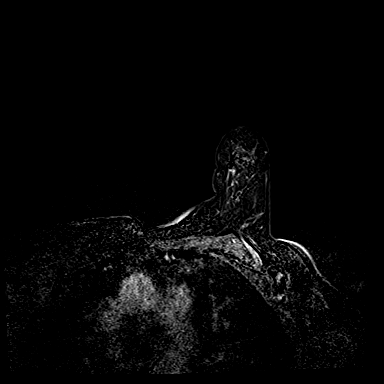

[32 of 48 positions shown; findings below may reference images not displayed]

Three-dimensional MR images were rendered by post-processing of the
original MR data on an independent workstation. The
three-dimensional MR images were interpreted, and findings are
reported in the following complete MRI report for this study. Three
dimensional images were evaluated at the independent [employer]
workstation
FINDINGS: Breast composition: c. Heterogeneous fibroglandular tissue.

Background parenchymal enhancement: Marked

Left breast: There are scattered foci of enhancement throughout the
LEFT breast. No suspicious abnormal enhancement identified.
Specifically within the addendum QUADRANT of the LEFT breast, no
discrete abnormality is identified. LEFT-sided Port-A-Cath is
present.

Lymph nodes: No abnormal appearing lymph nodes.

Ancillary findings:  None.
IMPRESSION: No discrete abnormality identified in the UPPER INNER QUADRANT of
the LEFT breast to warrant biopsy.

RECOMMENDATION:
Recommend bilateral breast MRI in 6 months to assess stability of
the above findings.

Recommend continued treatment plan for known RIGHT breast cancer.

BI-RADS CATEGORY  1: Negative.

ADDENDUM:
Corrected report

LEFT breast: No suspicious abnormal enhancement identified.

Specifically within the UPPER INNER QUADRANT of the LEFT breast, no

discrete abnormality is identified. LEFT-sided Port-A-Cath is

present.

*** End of Addendum ***
Three-dimensional MR images were rendered by post-processing of the
original MR data on an independent workstation. The
three-dimensional MR images were interpreted, and findings are
reported in the following complete MRI report for this study. Three
dimensional images were evaluated at the independent [employer]
workstation
FINDINGS: Breast composition: c. Heterogeneous fibroglandular tissue.

Background parenchymal enhancement: Marked

Left breast: There are scattered foci of enhancement throughout the
LEFT breast. No suspicious abnormal enhancement identified.
Specifically within the addendum QUADRANT of the LEFT breast, no
discrete abnormality is identified. LEFT-sided Port-A-Cath is
present.

Lymph nodes: No abnormal appearing lymph nodes.

Ancillary findings:  None.
IMPRESSION: No discrete abnormality identified in the UPPER INNER QUADRANT of
the LEFT breast to warrant biopsy.

RECOMMENDATION:
Recommend bilateral breast MRI in 6 months to assess stability of
the above findings.

Recommend continued treatment plan for known RIGHT breast cancer.

BI-RADS CATEGORY  1: Negative.

## 2019-04-09 MED ORDER — GADOBUTROL 1 MMOL/ML IV SOLN
6.0000 mL | Freq: Once | INTRAVENOUS | Status: AC | PRN
  Administered 2019-04-09: 6 mL via INTRAVENOUS

## 2019-04-09 NOTE — Telephone Encounter (Signed)
Error. DW

## 2019-04-11 ENCOUNTER — Encounter: Payer: Self-pay | Admitting: Internal Medicine

## 2019-04-11 ENCOUNTER — Telehealth: Payer: Self-pay

## 2019-04-11 ENCOUNTER — Other Ambulatory Visit: Payer: Self-pay | Admitting: Hematology and Oncology

## 2019-04-11 ENCOUNTER — Other Ambulatory Visit: Payer: Self-pay

## 2019-04-11 ENCOUNTER — Ambulatory Visit (INDEPENDENT_AMBULATORY_CARE_PROVIDER_SITE_OTHER): Payer: Medicare HMO | Admitting: Internal Medicine

## 2019-04-11 VITALS — BP 123/77 | HR 97 | Temp 98.2°F | Ht 62.0 in | Wt 128.7 lb

## 2019-04-11 DIAGNOSIS — Z87440 Personal history of urinary (tract) infections: Secondary | ICD-10-CM | POA: Diagnosis not present

## 2019-04-11 DIAGNOSIS — N3000 Acute cystitis without hematuria: Secondary | ICD-10-CM | POA: Diagnosis not present

## 2019-04-11 LAB — POCT URINALYSIS DIPSTICK
Glucose, UA: POSITIVE — AB
Ketones, UA: 15
Protein, UA: POSITIVE — AB
Spec Grav, UA: 1.01 (ref 1.010–1.025)
Urobilinogen, UA: 4 E.U./dL — AB
pH, UA: 6 (ref 5.0–8.0)

## 2019-04-11 MED ORDER — OXYCODONE-ACETAMINOPHEN 5-325 MG PO TABS: 1.0000 | ORAL_TABLET | Freq: Three times a day (TID) | ORAL | 0 refills | Status: DC | PRN

## 2019-04-11 MED ORDER — SULFAMETHOXAZOLE-TRIMETHOPRIM 800-160 MG PO TABS: 1.0000 | ORAL_TABLET | Freq: Two times a day (BID) | ORAL | 0 refills | Status: AC

## 2019-04-11 NOTE — Patient Instructions (Signed)
Thank you for allowing [place] to care for you  Your symptoms are consistent with an UTI - Will provide Rx for Bactrim to take twice a day for 3 days

## 2019-04-11 NOTE — Progress Notes (Signed)
   CC: Urinary frequency, dysuria   HPI:   DebbieDebbie Watts is a 58 y.o. F with PMHx listed below presenting for Urinary frequency, dysuria. Please see the A&P for the status of the patient's chronic medical problems.  Past Medical History:  Diagnosis Date  . Depression   . Deviated nasal septum   . Hearing loss 06/11/2014   Bilateral  . Herniated cervical disc   . Hypertension   . Migraine   . Miscarriage    2  . MS (multiple sclerosis) ([place])    Review of Systems:  Performed and all others negative.  Physical Exam:  Vitals:   04/11/19 1045  BP: 123/77  Pulse: 97  Temp: 98.2 F (36.8 C)  TempSrc: Oral  SpO2: 96%  Weight: 128 lb 11.2 oz (58.4 kg)  Height: 5\' 2"  (1.575 m)   Physical Exam Constitutional:      General: She is not in acute distress.    Appearance: Normal appearance.  Cardiovascular:     Rate and Rhythm: Normal rate and regular rhythm.     Pulses: Normal pulses.     Heart sounds: Normal heart sounds.  Pulmonary:     Effort: Pulmonary effort is normal. No respiratory distress.     Breath sounds: Normal breath sounds.  Abdominal:     General: Bowel sounds are normal. There is no distension.     Palpations: Abdomen is soft.     Comments: Mild tenderness to palpation over suprapubic region  Musculoskeletal:        General: No swelling or deformity.  Skin:    General: Skin is warm and dry.  Neurological:     General: No focal deficit present.     Mental Status: Mental status is at baseline.    Assessment & Plan:   See Encounters Tab for problem based charting.  Patient discussed with Dr. Angelia Mould

## 2019-04-11 NOTE — Telephone Encounter (Signed)
Pt calls and states she has urgency, burning, started 5 to 6 days ago. Tried otc meds not working, feels worse, not sleeping. appt acc 1045 today

## 2019-04-11 NOTE — Assessment & Plan Note (Addendum)
Patient reports 1-2 weeks of urinary frequency and dysuria. She is concerned about UTI as this is similar to her prior symptoms and has begun to interfere with her sleep. She has tried OTC AZO, which has not helped. She has mild suprapubic tenderness on exam. Urine dipstick result consistent with UTI. Unable to provide adequate sample for formal U/A and Culture (some results of dipstick may be inaccurate due to small amount of concentrated urine), but has culture from last year showing pansenstive e. Coli. Has respond well to 3 days of bactrim in the past. - Bactrim DS BID x 3d

## 2019-04-11 NOTE — Telephone Encounter (Signed)
Pt would like med for UTI. Please call pt back.

## 2019-04-11 NOTE — Telephone Encounter (Signed)
RN returned call regarding UTI symptoms.  Pt has already made appointment with PCP for UTI.  Pt request refill on Oxycodone for low back pain.  Will have MD review for approval.

## 2019-04-12 ENCOUNTER — Telehealth: Payer: Self-pay | Admitting: Hematology and Oncology

## 2019-04-12 NOTE — Telephone Encounter (Signed)
Spoke with patient re June/July appointments.

## 2019-04-16 ENCOUNTER — Other Ambulatory Visit: Payer: Self-pay | Admitting: Hematology and Oncology

## 2019-04-16 NOTE — Progress Notes (Signed)
Internal Medicine Clinic Attending  Case discussed with Dr. Melvin  at the time of the visit.  We reviewed the resident's history and exam and pertinent patient test results.  I agree with the assessment, diagnosis, and plan of care documented in the resident's note.  

## 2019-04-17 ENCOUNTER — Inpatient Hospital Stay: Payer: Medicare HMO

## 2019-04-17 ENCOUNTER — Other Ambulatory Visit: Payer: Self-pay

## 2019-04-17 ENCOUNTER — Encounter: Payer: Self-pay | Admitting: *Deleted

## 2019-04-17 VITALS — BP 114/73 | HR 100 | Temp 98.7°F | Resp 18 | Ht 62.0 in | Wt 126.5 lb

## 2019-04-17 DIAGNOSIS — C50411 Malignant neoplasm of upper-outer quadrant of right female breast: Secondary | ICD-10-CM

## 2019-04-17 DIAGNOSIS — Z170 Estrogen receptor positive status [ER+]: Secondary | ICD-10-CM

## 2019-04-17 DIAGNOSIS — Z5111 Encounter for antineoplastic chemotherapy: Secondary | ICD-10-CM | POA: Diagnosis not present

## 2019-04-17 DIAGNOSIS — Z5112 Encounter for antineoplastic immunotherapy: Secondary | ICD-10-CM | POA: Diagnosis not present

## 2019-04-17 DIAGNOSIS — Z5189 Encounter for other specified aftercare: Secondary | ICD-10-CM | POA: Diagnosis not present

## 2019-04-17 DIAGNOSIS — Z95828 Presence of other vascular implants and grafts: Secondary | ICD-10-CM

## 2019-04-17 LAB — CMP (CANCER CENTER ONLY)
ALT: 38 U/L (ref 0–44)
AST: 26 U/L (ref 15–41)
Albumin: 3.7 g/dL (ref 3.5–5.0)
Alkaline Phosphatase: 87 U/L (ref 38–126)
Anion gap: 11 (ref 5–15)
BUN: 29 mg/dL — ABNORMAL HIGH (ref 6–20)
CO2: 24 mmol/L (ref 22–32)
Calcium: 8.1 mg/dL — ABNORMAL LOW (ref 8.9–10.3)
Chloride: 103 mmol/L (ref 98–111)
Creatinine: 1.17 mg/dL — ABNORMAL HIGH (ref 0.44–1.00)
GFR, Est AFR Am: >60 mL/min (ref >60)
GFR, Estimated: 52 mL/min — ABNORMAL LOW (ref >60)
Glucose, Bld: 89 mg/dL (ref 70–99)
Potassium: 3.9 mmol/L (ref 3.5–5.1)
Sodium: 138 mmol/L (ref 135–145)
Total Bilirubin: 0.3 mg/dL (ref 0.3–1.2)
Total Protein: 6.3 g/dL — ABNORMAL LOW (ref 6.5–8.1)

## 2019-04-17 LAB — CBC WITH DIFFERENTIAL (CANCER CENTER ONLY)
Abs Immature Granulocytes: 0.04 10*3/uL (ref 0.00–0.07)
Basophils Absolute: 0 10*3/uL (ref 0.0–0.1)
Basophils Relative: 1 %
Eosinophils Absolute: 0 10*3/uL (ref 0.0–0.5)
Eosinophils Relative: 0 %
HCT: 27.5 % — ABNORMAL LOW (ref 36.0–46.0)
Hemoglobin: 8.9 g/dL — ABNORMAL LOW (ref 12.0–15.0)
Immature Granulocytes: 1 %
Lymphocytes Relative: 13 %
Lymphs Abs: 0.8 10*3/uL (ref 0.7–4.0)
MCH: 33.6 pg (ref 26.0–34.0)
MCHC: 32.4 g/dL (ref 30.0–36.0)
MCV: 103.8 fL — ABNORMAL HIGH (ref 80.0–100.0)
Monocytes Absolute: 0.9 10*3/uL (ref 0.1–1.0)
Monocytes Relative: 15 %
Neutro Abs: 4.1 10*3/uL (ref 1.7–7.7)
Neutrophils Relative %: 70 %
Platelet Count: 208 10*3/uL (ref 150–400)
RBC: 2.65 MIL/uL — ABNORMAL LOW (ref 3.87–5.11)
RDW: 15.9 % — ABNORMAL HIGH (ref 11.5–15.5)
WBC Count: 5.8 10*3/uL (ref 4.0–10.5)
nRBC: 0 % (ref 0.0–0.2)

## 2019-04-17 MED ORDER — SODIUM CHLORIDE 0.9 % IV SOLN
420.0000 mg | Freq: Once | INTRAVENOUS | Status: AC
  Administered 2019-04-17: 420 mg via INTRAVENOUS
  Filled 2019-04-17: qty 14

## 2019-04-17 MED ORDER — DIPHENHYDRAMINE HCL 25 MG PO CAPS
ORAL_CAPSULE | ORAL | Status: AC
  Filled 2019-04-17: qty 2

## 2019-04-17 MED ORDER — ACETAMINOPHEN 325 MG PO TABS
650.0000 mg | ORAL_TABLET | Freq: Once | ORAL | Status: AC
  Administered 2019-04-17: 650 mg via ORAL

## 2019-04-17 MED ORDER — DIPHENHYDRAMINE HCL 25 MG PO CAPS
50.0000 mg | ORAL_CAPSULE | Freq: Once | ORAL | Status: AC
  Administered 2019-04-17: 50 mg via ORAL

## 2019-04-17 MED ORDER — HEPARIN SOD (PORK) LOCK FLUSH 100 UNIT/ML IV SOLN
500.0000 [IU] | Freq: Once | INTRAVENOUS | Status: AC | PRN
  Administered 2019-04-17: 500 [IU]
  Filled 2019-04-17: qty 5

## 2019-04-17 MED ORDER — TRASTUZUMAB CHEMO 150 MG IV SOLR
6.0000 mg/kg | Freq: Once | INTRAVENOUS | Status: AC
  Administered 2019-04-17: 378 mg via INTRAVENOUS
  Filled 2019-04-17: qty 18

## 2019-04-17 MED ORDER — ACETAMINOPHEN 325 MG PO TABS
ORAL_TABLET | ORAL | Status: AC
  Filled 2019-04-17: qty 2

## 2019-04-17 MED ORDER — SODIUM CHLORIDE 0.9% FLUSH
10.0000 mL | Freq: Once | INTRAVENOUS | Status: AC
  Administered 2019-04-17: 10 mL
  Filled 2019-04-17: qty 10

## 2019-04-17 MED ORDER — SODIUM CHLORIDE 0.9 % IV SOLN
Freq: Once | INTRAVENOUS | Status: AC
  Administered 2019-04-17: 15:00:00 via INTRAVENOUS
  Filled 2019-04-17: qty 250

## 2019-04-17 MED ORDER — SODIUM CHLORIDE 0.9% FLUSH
10.0000 mL | INTRAVENOUS | Status: DC | PRN
  Administered 2019-04-17: 10 mL
  Filled 2019-04-17: qty 10

## 2019-04-17 NOTE — Patient Instructions (Signed)
[place] [facility] Discharge Instructions for Patients Receiving Chemotherapy  Today you received the following chemotherapy agents Trastuzumab (HERCEPTIN) & Pertuzumab (PERJETA).  To help prevent nausea and vomiting after your treatment, we encourage you to take your nausea medication as prescribed.  If you develop nausea and vomiting that is not controlled by your nausea medication, call the clinic.   BELOW ARE SYMPTOMS THAT SHOULD BE REPORTED IMMEDIATELY:  *FEVER GREATER THAN 100.5 F  *CHILLS WITH OR WITHOUT FEVER  NAUSEA AND VOMITING THAT IS NOT CONTROLLED WITH YOUR NAUSEA MEDICATION  *UNUSUAL SHORTNESS OF BREATH  *UNUSUAL BRUISING OR BLEEDING  TENDERNESS IN MOUTH AND THROAT WITH OR WITHOUT PRESENCE OF ULCERS  *URINARY PROBLEMS  *BOWEL PROBLEMS  UNUSUAL RASH Items with * indicate a potential emergency and should be followed up as soon as possible.  Feel free to call the clinic should you have any questions or concerns. The clinic phone number is [phone].  Please show the [place] at check-in to the Emergency Department and triage nurse.  Coronavirus (COVID-19) Are you at risk?  Are you at risk for the Coronavirus (COVID-19)?  To be considered HIGH RISK for Coronavirus (COVID-19), you have to meet the following criteria:  . Traveled to [place], [place], [place], [place] or [place]; or in the [place] to [place], [place], [place], or [place]; and have fever, cough, and shortness of breath within the last 2 weeks of travel OR . Been in close contact with a person diagnosed with COVID-19 within the last 2 weeks and have fever, cough, and shortness of breath . IF YOU DO NOT MEET THESE CRITERIA, YOU ARE CONSIDERED LOW RISK FOR COVID-19.  What to do if you are HIGH RISK for COVID-19?  Marland Kitchen If you are having a medical emergency, call 911. . Seek medical care right away. Before you go to a doctor's office, urgent care or emergency department,  call ahead and tell them about your recent travel, contact with someone diagnosed with COVID-19, and your symptoms. You should receive instructions from your physician's office regarding next steps of care.  . When you arrive at healthcare provider, tell the healthcare staff immediately you have returned from visiting [place], [place], [place], [place] or [place]; or traveled in the [place] to [place], [place], [place], or [place]; in the last two weeks or you have been in close contact with a person diagnosed with COVID-19 in the last 2 weeks.   . Tell the health care staff about your symptoms: fever, cough and shortness of breath. . After you have been seen by a medical provider, you will be either: o Tested for (COVID-19) and discharged home on quarantine except to seek medical care if symptoms worsen, and asked to  - Stay home and avoid contact with others until you get your results (4-5 days)  - Avoid travel on public transportation if possible (such as bus, train, or airplane) or o Sent to the Emergency Department by EMS for evaluation, COVID-19 testing, and possible admission depending on your condition and test results.  What to do if you are LOW RISK for COVID-19?  Reduce your risk of any infection by using the same precautions used for avoiding the common cold or flu:  Marland Kitchen Wash your hands often with soap and warm water for at least 20 seconds.  If soap and water are not readily available, use an alcohol-based hand sanitizer with at least 60% alcohol.  . If  coughing or sneezing, cover your mouth and nose by coughing or sneezing into the elbow areas of your shirt or coat, into a tissue or into your sleeve (not your hands). . Avoid shaking hands with others and consider head nods or verbal greetings only. . Avoid touching your eyes, nose, or mouth with unwashed hands.  . Avoid close contact with people who are sick. . Avoid places or events with large numbers of people in one  location, like concerts or sporting events. . Carefully consider travel plans you have or are making. . If you are planning any travel outside or inside the [place], visit the CDC's Travelers' Health webpage for the latest health notices. . If you have some symptoms but not all symptoms, continue to monitor at home and seek medical attention if your symptoms worsen. . If you are having a medical emergency, call 911.   [place] / e-Visit: [email or web address]         [facility]: [facility]: [phone]                   [facility]: [phone]

## 2019-04-18 ENCOUNTER — Telehealth: Payer: Self-pay | Admitting: Hematology and Oncology

## 2019-04-18 NOTE — Telephone Encounter (Signed)
R/s appt per 6/23 sch message - pt aware of new appt date and time   

## 2019-04-19 ENCOUNTER — Encounter: Payer: Self-pay | Admitting: *Deleted

## 2019-04-20 ENCOUNTER — Ambulatory Visit (INDEPENDENT_AMBULATORY_CARE_PROVIDER_SITE_OTHER): Payer: Medicare HMO | Admitting: Internal Medicine

## 2019-04-20 ENCOUNTER — Other Ambulatory Visit: Payer: Self-pay

## 2019-04-20 ENCOUNTER — Telehealth: Payer: Self-pay

## 2019-04-20 ENCOUNTER — Encounter: Payer: Self-pay | Admitting: Internal Medicine

## 2019-04-20 DIAGNOSIS — N3000 Acute cystitis without hematuria: Secondary | ICD-10-CM | POA: Diagnosis not present

## 2019-04-20 MED ORDER — NITROFURANTOIN MONOHYD MACRO 100 MG PO CAPS: 100.0000 mg | ORAL_CAPSULE | Freq: Two times a day (BID) | ORAL | 0 refills | Status: AC

## 2019-04-20 NOTE — Progress Notes (Signed)
  [facility] Telephone Encounter Continuity Care Appointment  HPI:   This telephone encounter was created for [name] on 04/20/2019 for the following purpose/cc UTI.    Past Medical History:  Past Medical History:  Diagnosis Date  . Depression   . Deviated nasal septum   . Hearing loss 06/11/2014   Bilateral  . Herniated cervical disc   . Hypertension   . Migraine   . Miscarriage    2  . MS (multiple sclerosis) ([place])       ROS:   Performed and all others negative.   Assessment / Plan / Recommendations:   Please see A&P under problem oriented charting for assessment of the patient's acute and chronic medical conditions.   As always, pt is advised that if symptoms worsen or new symptoms arise, they should go to an urgent care facility or to to ER for further evaluation.   Consent and Medical Decision Making:   Patient discussed with Dr. Angelia Mould  This is a telephone encounter between Debbie Watts and Neva Seat on 04/20/2019 for UTI. The visit was conducted with the patient located at home and Neva Seat at [facility]. The patient's identity was confirmed using their DOB and current address. The patient has consented to being evaluated through a telephone encounter and understands the associated risks (an examination cannot be done and the patient may need to come in for an appointment) / benefits (allows the patient to remain at home, decreasing exposure to coronavirus). I personally spent 3 minutes on medical discussion.

## 2019-04-20 NOTE — Assessment & Plan Note (Addendum)
Patient contacted by phone. She reports that her dysuria and urinary frequency have persisted despite 3 days of Bactrim. She denies odor, flank pain, or fever. Given that she has failed the Bactrim (putting her at higher risk for resistance), will trial 7d course of Macrobid. She received chemotherapy infusion before and since her previous visit for UTI. She was instructed to contact her oncologist's office about her UTI and our treatments as urinary symptoms are included in their infusion note for symptoms to monitor and the chemotherapy increases her risk of UTI and blunted immune response. - Macrobid 100mg  BID x 7d - Contact Oncologist's office to update them on her symptoms

## 2019-04-23 NOTE — Progress Notes (Signed)
Internal Medicine Clinic Attending  Case discussed with Dr. Melvin  at the time of the visit.  We reviewed the resident's history and exam and pertinent patient test results.  I agree with the assessment, diagnosis, and plan of care documented in the resident's note.  

## 2019-04-24 NOTE — Pre-Procedure Instructions (Signed)
Paige Monarrez  04/24/2019      [id number] [place], [place] [address] [place] [address] Phone: [phone] Fax: [phone]  [place], [place] [address] [place] [address] Phone: [phone] Fax: [phone]    Your procedure is scheduled on April 30, 2019.  Report to [facility] Entrance "A" at 530 AM.  Call this number if you have problems the morning of surgery:  [phone]  Call [phone] if you have any questions prior to your surgery date Monday-Friday 8am-4pm    Remember:  Do not eat or drink after midnight.  You may drink clear liquids until 430 AM .  Clear liquids allowed are: Ensure Pre-surgery drink,  Water, Juice (non-citric and without pulp), Clear Tea, Black Coffee only and Gatorade   Please complete your PRE-SURGERY ENSURE that was provided to you 3 hours prior to you surgery start time (430 AM).  Please, if able, drink it in one setting. DO NOT SIP.    Take these medicines the morning of surgery with A SIP OF WATER  Gabapentin (neurontin) Omeprazole (prilosec) Macrobid Paroxetine (paxil)  Oxycodone-acetaminophen-if needed for apin Tramadol-if needed for pain Prochlorperazine (compazine)-if needed for nausea Ventolin inhaler- if needed  Bring inhalers with you please  7 days prior to surgery STOP taking any (Excedrin Migraine), Aspirin (unless otherwise instructed by your surgeon), Aleve, Naproxen, Ibuprofen, Motrin, Advil, Goody's, BC's, all herbal medications, fish oil, and all vitamins    Do not wear jewelry, make-up or nail polish.  Do not wear lotions, powders, or perfumes, or deodorant.  Do not shave 48 hours prior to surgery.   Do not bring valuables to the hospital.  [facility] is not responsible for any belongings or valuables.  Contacts, dentures or bridgework may not be worn into surgery.  Leave your suitcase in the car.  After surgery it may be  brought to your room.  For patients admitted to the hospital, discharge time will be determined by your treatment team.  Patients discharged the day of surgery will not be allowed to drive home.    [facility]- Preparing For Surgery  Before surgery, you can play an important role. Because skin is not sterile, your skin needs to be as free of germs as possible. You can reduce the number of germs on your skin by washing with CHG (chlorahexidine gluconate) Soap before surgery.  CHG is an antiseptic cleaner which kills germs and bonds with the skin to continue killing germs even after washing.    Oral Hygiene is also important to reduce your risk of infection.  Remember - BRUSH YOUR TEETH THE MORNING OF SURGERY WITH YOUR REGULAR TOOTHPASTE  Please do not use if you have an allergy to CHG or antibacterial soaps. If your skin becomes reddened/irritated stop using the CHG.  Do not shave (including legs and underarms) for at least 48 hours prior to first CHG shower. It is OK to shave your face.  Please follow these instructions carefully.   1. Shower the NIGHT BEFORE SURGERY and the MORNING OF SURGERY with CHG.   2. If you chose to wash your hair, wash your hair first as usual with your normal shampoo.  3. After you shampoo, rinse your hair and body thoroughly to remove the shampoo.  4. Use CHG as you would any other liquid soap. You can apply CHG directly to the skin and wash gently with a  scrungie or a clean washcloth.   5. Apply the CHG Soap to your body ONLY FROM THE NECK DOWN.  Do not use on open wounds or open sores. Avoid contact with your eyes, ears, mouth and genitals (private parts). Wash Face and genitals (private parts)  with your normal soap.  6. Wash thoroughly, paying special attention to the area where your surgery will be performed.  7. Thoroughly rinse your body with warm water from the neck down.  8. DO NOT shower/wash with your normal soap after using and rinsing off the  CHG Soap.  9. Pat yourself dry with a CLEAN TOWEL.  10. Wear CLEAN PAJAMAS to bed the night before surgery, wear comfortable clothes the morning of surgery  11. Place CLEAN SHEETS on your bed the night of your first shower and DO NOT SLEEP WITH PETS.  Day of Surgery:  Do not apply any deodorants/lotions.  Please wear clean clothes to the hospital/surgery center.   Remember to brush your teeth WITH YOUR REGULAR TOOTHPASTE.  IF you are a smoker, DO NOT Smoke 24 hours prior to surgery   IF you wear a CPAP at night please bring your mask, tubing, and machine the morning of surgery    Remember that you must have someone to transport you home after your surgery, and remain with you for 24 hours if you are discharged the same day.  Please read over the following fact sheets that you were given.

## 2019-04-25 ENCOUNTER — Encounter (HOSPITAL_COMMUNITY)
Admission: RE | Admit: 2019-04-25 | Discharge: 2019-04-25 | Disposition: A | Discharge status: Home / Self Care | Payer: Medicare HMO | Source: Ambulatory Visit | Attending: General Surgery | Admitting: General Surgery

## 2019-04-25 ENCOUNTER — Encounter (HOSPITAL_COMMUNITY): Payer: Self-pay

## 2019-04-25 ENCOUNTER — Other Ambulatory Visit: Payer: Self-pay

## 2019-04-25 DIAGNOSIS — Z01812 Encounter for preprocedural laboratory examination: Secondary | ICD-10-CM | POA: Diagnosis not present

## 2019-04-25 DIAGNOSIS — Z1159 Encounter for screening for other viral diseases: Secondary | ICD-10-CM | POA: Diagnosis not present

## 2019-04-25 HISTORY — DX: Unspecified asthma, uncomplicated: J45.909

## 2019-04-25 HISTORY — DX: Malignant (primary) neoplasm, unspecified: C80.1

## 2019-04-25 HISTORY — DX: Unspecified osteoarthritis, unspecified site: M19.90

## 2019-04-25 LAB — COMPREHENSIVE METABOLIC PANEL
ALT: 40 U/L (ref 0–44)
AST: 53 U/L — ABNORMAL HIGH (ref 15–41)
Albumin: 3.7 g/dL (ref 3.5–5.0)
Alkaline Phosphatase: 69 U/L (ref 38–126)
Anion gap: 11 (ref 5–15)
BUN: 19 mg/dL (ref 6–20)
CO2: 22 mmol/L (ref 22–32)
Calcium: 8.7 mg/dL — ABNORMAL LOW (ref 8.9–10.3)
Chloride: 104 mmol/L (ref 98–111)
Creatinine, Ser: 0.8 mg/dL (ref 0.44–1.00)
GFR calc Af Amer: >60 mL/min (ref >60)
GFR calc non Af Amer: >60 mL/min (ref >60)
Glucose, Bld: 101 mg/dL — ABNORMAL HIGH (ref 70–99)
Potassium: 5.2 mmol/L — ABNORMAL HIGH (ref 3.5–5.1)
Sodium: 137 mmol/L (ref 135–145)
Total Bilirubin: 1.6 mg/dL — ABNORMAL HIGH (ref 0.3–1.2)
Total Protein: 5.7 g/dL — ABNORMAL LOW (ref 6.5–8.1)

## 2019-04-25 LAB — CBC
HCT: 30.1 % — ABNORMAL LOW (ref 36.0–46.0)
Hemoglobin: 10.2 g/dL — ABNORMAL LOW (ref 12.0–15.0)
MCH: 36.2 pg — ABNORMAL HIGH (ref 26.0–34.0)
MCHC: 33.9 g/dL (ref 30.0–36.0)
MCV: 106.7 fL — ABNORMAL HIGH (ref 80.0–100.0)
Platelets: 423 10*3/uL — ABNORMAL HIGH (ref 150–400)
RBC: 2.82 MIL/uL — ABNORMAL LOW (ref 3.87–5.11)
RDW: 17.2 % — ABNORMAL HIGH (ref 11.5–15.5)
WBC: 4.5 10*3/uL (ref 4.0–10.5)
nRBC: 0 % (ref 0.0–0.2)

## 2019-04-25 NOTE — Progress Notes (Signed)
PCP - Dr Trilby Drummer  At [facility] Cardiologist - na  Chest x-ray -3/20  EKG - 3/20 Stress Test - na ECHO - 5/20 Cardiac Cath - na  Sleep Study -na  CPAP -   Fasting Blood Sugar -na  Checks Blood Sugar _____ times a day   Anesthesia review:   Review ekg  And labs redrawn  Patient denies shortness of breath, fever, cough and chest pain at PAT appointment   Patient verbalized understanding of instructions that were given to them at the PAT appointment. Patient was also instructed that they will need to review over the PAT instructions again at home before surgery.

## 2019-04-26 ENCOUNTER — Other Ambulatory Visit (HOSPITAL_COMMUNITY)
Admission: RE | Admit: 2019-04-26 | Discharge: 2019-04-26 | Disposition: A | Discharge status: Home / Self Care | Payer: Medicare HMO | Source: Ambulatory Visit | Attending: General Surgery | Admitting: General Surgery

## 2019-04-26 ENCOUNTER — Ambulatory Visit (HOSPITAL_COMMUNITY): Payer: Medicare HMO

## 2019-04-26 DIAGNOSIS — Z01812 Encounter for preprocedural laboratory examination: Secondary | ICD-10-CM | POA: Diagnosis not present

## 2019-04-26 DIAGNOSIS — Z1159 Encounter for screening for other viral diseases: Secondary | ICD-10-CM | POA: Diagnosis not present

## 2019-04-26 LAB — SARS CORONAVIRUS 2 (TAT 6-24 HRS): SARS Coronavirus 2: NEGATIVE

## 2019-04-26 NOTE — Anesthesia Preprocedure Evaluation (Addendum)
Anesthesia Evaluation  Patient identified by MRN, date of birth, ID band Patient awake    Reviewed: Allergy & Precautions, NPO status , Patient's Chart, lab work & pertinent test results  History of Anesthesia Complications Negative for: history of anesthetic complications  Airway Mallampati: II  TM Distance: >3 FB Neck ROM: Full    Dental  (+) Teeth Intact   Pulmonary asthma , COPD, Current Smoker,    Pulmonary exam normal        Cardiovascular hypertension, Normal cardiovascular exam     Neuro/Psych PSYCHIATRIC DISORDERS Depression MS    GI/Hepatic GERD  ,(+)     substance abuse  marijuana use,   Endo/Other  negative endocrine ROS  Renal/GU negative Renal ROS  negative genitourinary   Musculoskeletal negative musculoskeletal ROS (+)   Abdominal   Peds  Hematology negative hematology ROS (+)   Anesthesia Other Findings Breast cancer  Reproductive/Obstetrics                           Anesthesia Physical Anesthesia Plan  ASA: III  Anesthesia Plan: General   Post-op Pain Management: GA combined w/ Regional for post-op pain   Induction:   PONV Risk Score and Plan: 2 and Ondansetron, Dexamethasone, Midazolam and Treatment may vary due to age or medical condition  Airway Management Planned: LMA  Additional Equipment: None  Intra-op Plan:   Post-operative Plan: Extubation in OR  Informed Consent: I have reviewed the patients History and Physical, chart, labs and discussed the procedure including the risks, benefits and alternatives for the proposed anesthesia with the patient or authorized representative who has indicated his/her understanding and acceptance.     Dental advisory given  Plan Discussed with:   Anesthesia Plan Comments: (Seen by Dr. Haroldine Laws for monitoring through cardio-oncology program as well as eval of syncope. Syncope thought to be vagal mediated in  setting of dehydration vomiting/diarrhea, no further workup at this time. Echo 03/07/19 WNL.  DOS potassium recheck requested by Dr. Donne Hazel.   TTE 03/07/19:  1. The left ventricle has normal systolic function with an ejection fraction of 60-65%. The cavity size was normal. Left ventricular diastolic Doppler parameters are consistent with impaired relaxation. No evidence of left ventricular regional wall  motion abnormalities. GLS -13.4%, low but strain images do not appear to track the walls well so not sure that this is accurate.  2. The right ventricle has normal systolic function. The cavity was normal. There is no increase in right ventricular wall thickness.  3. No evidence of mitral valve stenosis. Trivial mitral regurgitation.  4. The aortic valve is tricuspid. No stenosis of the aortic valve.  5. The aortic root is normal in size and structure.  6. The inferior vena cava was normal in size with <50% respiratory variability. PA systolic pressure 32 mmHg.)      Anesthesia Quick Evaluation

## 2019-04-30 ENCOUNTER — Ambulatory Visit (HOSPITAL_COMMUNITY)
Admission: RE | Admit: 2019-04-30 | Discharge: 2019-04-30 | Disposition: A | Discharge status: Home / Self Care | Payer: Medicare HMO | Source: Ambulatory Visit | Attending: General Surgery | Admitting: General Surgery

## 2019-04-30 ENCOUNTER — Other Ambulatory Visit: Payer: Self-pay

## 2019-04-30 ENCOUNTER — Encounter (HOSPITAL_COMMUNITY): Payer: Self-pay | Admitting: *Deleted

## 2019-04-30 ENCOUNTER — Inpatient Hospital Stay (HOSPITAL_COMMUNITY)
Admission: AD | Admit: 2019-04-30 | Discharge: 2019-05-02 | DRG: 581 | Disposition: A | Discharge status: Home / Self Care | Payer: Medicare HMO | Attending: General Surgery | Admitting: General Surgery

## 2019-04-30 ENCOUNTER — Encounter (HOSPITAL_COMMUNITY)
Admission: AD | Disposition: A | Discharge status: Home / Self Care | Payer: Self-pay | Source: Home / Self Care | Attending: General Surgery

## 2019-04-30 ENCOUNTER — Ambulatory Visit (HOSPITAL_COMMUNITY): Payer: Medicare HMO | Admitting: Physician Assistant

## 2019-04-30 DIAGNOSIS — H9193 Unspecified hearing loss, bilateral: Secondary | ICD-10-CM | POA: Diagnosis not present

## 2019-04-30 DIAGNOSIS — Z884 Allergy status to anesthetic agent status: Secondary | ICD-10-CM

## 2019-04-30 DIAGNOSIS — Z79899 Other long term (current) drug therapy: Secondary | ICD-10-CM | POA: Diagnosis not present

## 2019-04-30 DIAGNOSIS — Z170 Estrogen receptor positive status [ER+]: Secondary | ICD-10-CM | POA: Diagnosis not present

## 2019-04-30 DIAGNOSIS — F17210 Nicotine dependence, cigarettes, uncomplicated: Secondary | ICD-10-CM | POA: Diagnosis not present

## 2019-04-30 DIAGNOSIS — C50911 Malignant neoplasm of unspecified site of right female breast: Secondary | ICD-10-CM | POA: Diagnosis present

## 2019-04-30 DIAGNOSIS — I10 Essential (primary) hypertension: Secondary | ICD-10-CM | POA: Diagnosis present

## 2019-04-30 DIAGNOSIS — Z791 Long term (current) use of non-steroidal anti-inflammatories (NSAID): Secondary | ICD-10-CM | POA: Diagnosis not present

## 2019-04-30 DIAGNOSIS — Z7982 Long term (current) use of aspirin: Secondary | ICD-10-CM

## 2019-04-30 DIAGNOSIS — Z800 Family history of malignant neoplasm of digestive organs: Secondary | ICD-10-CM | POA: Diagnosis not present

## 2019-04-30 DIAGNOSIS — Z9221 Personal history of antineoplastic chemotherapy: Secondary | ICD-10-CM | POA: Diagnosis not present

## 2019-04-30 DIAGNOSIS — F329 Major depressive disorder, single episode, unspecified: Secondary | ICD-10-CM | POA: Diagnosis not present

## 2019-04-30 DIAGNOSIS — G8918 Other acute postprocedural pain: Secondary | ICD-10-CM | POA: Diagnosis not present

## 2019-04-30 DIAGNOSIS — J45909 Unspecified asthma, uncomplicated: Secondary | ICD-10-CM | POA: Diagnosis not present

## 2019-04-30 DIAGNOSIS — M1990 Unspecified osteoarthritis, unspecified site: Secondary | ICD-10-CM | POA: Diagnosis not present

## 2019-04-30 DIAGNOSIS — J449 Chronic obstructive pulmonary disease, unspecified: Secondary | ICD-10-CM | POA: Diagnosis not present

## 2019-04-30 DIAGNOSIS — C50411 Malignant neoplasm of upper-outer quadrant of right female breast: Secondary | ICD-10-CM | POA: Diagnosis not present

## 2019-04-30 DIAGNOSIS — G35 Multiple sclerosis: Secondary | ICD-10-CM | POA: Diagnosis present

## 2019-04-30 DIAGNOSIS — L7632 Postprocedural hematoma of skin and subcutaneous tissue following other procedure: Secondary | ICD-10-CM | POA: Diagnosis not present

## 2019-04-30 HISTORY — PX: MASTECTOMY W/ SENTINEL NODE BIOPSY: SHX2001

## 2019-04-30 HISTORY — PX: MASTECTOMY: SHX3

## 2019-04-30 HISTORY — PX: SIMPLE MASTECTOMY WITH AXILLARY SENTINEL NODE BIOPSY: SHX6098

## 2019-04-30 SURGERY — MASTECTOMY WITH SENTINEL LYMPH NODE BIOPSY
Anesthesia: General | Site: Breast | Laterality: Right

## 2019-04-30 MED ORDER — OXYCODONE HCL 5 MG PO TABS: 5.0000 mg | ORAL_TABLET | Freq: Once | ORAL | Status: DC | PRN

## 2019-04-30 MED ORDER — LIDOCAINE 2% (20 MG/ML) 5 ML SYRINGE
INTRAMUSCULAR | Status: AC
  Filled 2019-04-30: qty 5

## 2019-04-30 MED ORDER — ONDANSETRON HCL 4 MG/2ML IJ SOLN
INTRAMUSCULAR | Status: AC
  Filled 2019-04-30: qty 2

## 2019-04-30 MED ORDER — LISINOPRIL 5 MG PO TABS
5.0000 mg | ORAL_TABLET | Freq: Every day | ORAL | Status: DC
  Administered 2019-04-30: 5 mg via ORAL
  Filled 2019-04-30 (×2): qty 1

## 2019-04-30 MED ORDER — MORPHINE SULFATE (PF) 2 MG/ML IV SOLN
2.0000 mg | INTRAVENOUS | Status: DC | PRN
  Administered 2019-04-30: 2 mg via INTRAVENOUS
  Filled 2019-04-30 (×2): qty 1

## 2019-04-30 MED ORDER — MIDAZOLAM HCL 2 MG/2ML IJ SOLN
INTRAMUSCULAR | Status: AC
  Filled 2019-04-30: qty 2

## 2019-04-30 MED ORDER — PAROXETINE HCL 20 MG PO TABS
20.0000 mg | ORAL_TABLET | Freq: Every day | ORAL | Status: DC
  Administered 2019-05-01 – 2019-05-02 (×2): 20 mg via ORAL
  Filled 2019-04-30 (×2): qty 1

## 2019-04-30 MED ORDER — ONDANSETRON HCL 4 MG/2ML IJ SOLN: 4.0000 mg | Freq: Four times a day (QID) | INTRAMUSCULAR | Status: DC | PRN

## 2019-04-30 MED ORDER — LACTATED RINGERS IV SOLN
INTRAVENOUS | Status: DC | PRN
  Administered 2019-04-30: 07:00:00 via INTRAVENOUS

## 2019-04-30 MED ORDER — PANTOPRAZOLE SODIUM 40 MG PO TBEC: 40.0000 mg | DELAYED_RELEASE_TABLET | Freq: Every day | ORAL | Status: DC | PRN

## 2019-04-30 MED ORDER — SIMETHICONE 80 MG PO CHEW: 40.0000 mg | CHEWABLE_TABLET | Freq: Four times a day (QID) | ORAL | Status: DC | PRN

## 2019-04-30 MED ORDER — FENTANYL CITRATE (PF) 100 MCG/2ML IJ SOLN
INTRAMUSCULAR | Status: DC | PRN
  Administered 2019-04-30: 100 ug via INTRAVENOUS
  Administered 2019-04-30: 50 ug via INTRAVENOUS

## 2019-04-30 MED ORDER — PROPOFOL 10 MG/ML IV BOLUS
INTRAVENOUS | Status: AC
  Filled 2019-04-30: qty 20

## 2019-04-30 MED ORDER — KETOROLAC TROMETHAMINE 15 MG/ML IJ SOLN
15.0000 mg | INTRAMUSCULAR | Status: AC
  Administered 2019-04-30: 08:00:00 15 mg via INTRAVENOUS
  Filled 2019-04-30: qty 1

## 2019-04-30 MED ORDER — FENTANYL CITRATE (PF) 250 MCG/5ML IJ SOLN
INTRAMUSCULAR | Status: AC
  Filled 2019-04-30: qty 5

## 2019-04-30 MED ORDER — HYDRALAZINE HCL 20 MG/ML IJ SOLN: 10.0000 mg | INTRAMUSCULAR | Status: DC | PRN

## 2019-04-30 MED ORDER — LOPERAMIDE HCL 2 MG PO CAPS: 2.0000 mg | ORAL_CAPSULE | Freq: Three times a day (TID) | ORAL | Status: DC | PRN

## 2019-04-30 MED ORDER — SODIUM CHLORIDE 0.9 % IV BOLUS
500.0000 mL | Freq: Once | INTRAVENOUS | Status: AC
  Administered 2019-04-30: 500 mL via INTRAVENOUS

## 2019-04-30 MED ORDER — ENSURE PRE-SURGERY PO LIQD
296.0000 mL | Freq: Once | ORAL | Status: DC
  Filled 2019-04-30: qty 296

## 2019-04-30 MED ORDER — LIDOCAINE 2% (20 MG/ML) 5 ML SYRINGE
INTRAMUSCULAR | Status: DC | PRN
  Administered 2019-04-30: 60 mg via INTRAVENOUS

## 2019-04-30 MED ORDER — POLYETHYLENE GLYCOL 3350 17 G PO PACK: 17.0000 g | PACK | Freq: Every day | ORAL | Status: DC | PRN

## 2019-04-30 MED ORDER — DEXAMETHASONE SODIUM PHOSPHATE 10 MG/ML IJ SOLN
INTRAMUSCULAR | Status: AC
  Filled 2019-04-30: qty 1

## 2019-04-30 MED ORDER — OXYCODONE HCL 5 MG PO TABS
5.0000 mg | ORAL_TABLET | ORAL | Status: DC | PRN
  Administered 2019-04-30 – 2019-05-02 (×6): 5 mg via ORAL
  Filled 2019-04-30 (×7): qty 1

## 2019-04-30 MED ORDER — ONDANSETRON HCL 4 MG/2ML IJ SOLN: 4.0000 mg | Freq: Once | INTRAMUSCULAR | Status: DC | PRN

## 2019-04-30 MED ORDER — ONDANSETRON 4 MG PO TBDP: 4.0000 mg | ORAL_TABLET | Freq: Four times a day (QID) | ORAL | Status: DC | PRN

## 2019-04-30 MED ORDER — GABAPENTIN 600 MG PO TABS
600.0000 mg | ORAL_TABLET | Freq: Two times a day (BID) | ORAL | Status: DC
  Administered 2019-04-30 – 2019-05-02 (×4): 600 mg via ORAL
  Filled 2019-04-30 (×6): qty 1

## 2019-04-30 MED ORDER — OMEPRAZOLE MAGNESIUM 20 MG PO TBEC: 20.0000 mg | DELAYED_RELEASE_TABLET | Freq: Every day | ORAL | Status: DC | PRN

## 2019-04-30 MED ORDER — GABAPENTIN 100 MG PO CAPS
100.0000 mg | ORAL_CAPSULE | ORAL | Status: DC
  Filled 2019-04-30: qty 1

## 2019-04-30 MED ORDER — ROPIVACAINE HCL 5 MG/ML IJ SOLN
INTRAMUSCULAR | Status: DC | PRN
  Administered 2019-04-30: 30 mL via PERINEURAL

## 2019-04-30 MED ORDER — SODIUM CHLORIDE 0.9 % IV SOLN
INTRAVENOUS | Status: DC
  Administered 2019-04-30: 11:00:00 via INTRAVENOUS

## 2019-04-30 MED ORDER — TECHNETIUM TC 99M SULFUR COLLOID FILTERED
0.5000 | Freq: Once | INTRAVENOUS | Status: AC | PRN
  Administered 2019-04-30: 07:00:00 0.5 via INTRADERMAL

## 2019-04-30 MED ORDER — DEXAMETHASONE SODIUM PHOSPHATE 10 MG/ML IJ SOLN
INTRAMUSCULAR | Status: DC | PRN
  Administered 2019-04-30: 5 mg via INTRAVENOUS

## 2019-04-30 MED ORDER — ASPIRIN-ACETAMINOPHEN-CAFFEINE 250-250-65 MG PO TABS: 2.0000 | ORAL_TABLET | Freq: Four times a day (QID) | ORAL | Status: DC | PRN

## 2019-04-30 MED ORDER — MIDAZOLAM HCL 5 MG/5ML IJ SOLN
INTRAMUSCULAR | Status: DC | PRN
  Administered 2019-04-30: 2 mg via INTRAVENOUS

## 2019-04-30 MED ORDER — ONDANSETRON HCL 4 MG/2ML IJ SOLN
INTRAMUSCULAR | Status: DC | PRN
  Administered 2019-04-30: 4 mg via INTRAVENOUS

## 2019-04-30 MED ORDER — OXYCODONE HCL 5 MG/5ML PO SOLN: 5.0000 mg | Freq: Once | ORAL | Status: DC | PRN

## 2019-04-30 MED ORDER — 0.9 % SODIUM CHLORIDE (POUR BTL) OPTIME
TOPICAL | Status: DC | PRN
  Administered 2019-04-30: 1000 mL

## 2019-04-30 MED ORDER — ACETAMINOPHEN 500 MG PO TABS
1000.0000 mg | ORAL_TABLET | Freq: Four times a day (QID) | ORAL | Status: DC
  Administered 2019-04-30 – 2019-05-02 (×7): 1000 mg via ORAL
  Filled 2019-04-30 (×7): qty 2

## 2019-04-30 MED ORDER — DICLOFENAC SODIUM 75 MG PO TBEC
75.0000 mg | DELAYED_RELEASE_TABLET | Freq: Two times a day (BID) | ORAL | Status: DC
  Administered 2019-05-01 (×2): 75 mg via ORAL
  Filled 2019-04-30 (×3): qty 1

## 2019-04-30 MED ORDER — FLUTICASONE PROPIONATE 50 MCG/ACT NA SUSP
2.0000 | Freq: Every day | NASAL | Status: DC
  Filled 2019-04-30: qty 16

## 2019-04-30 MED ORDER — FENTANYL CITRATE (PF) 100 MCG/2ML IJ SOLN: 25.0000 ug | INTRAMUSCULAR | Status: DC | PRN

## 2019-04-30 MED ORDER — ALBUTEROL SULFATE (2.5 MG/3ML) 0.083% IN NEBU: 2.5000 mg | INHALATION_SOLUTION | Freq: Four times a day (QID) | RESPIRATORY_TRACT | Status: DC | PRN

## 2019-04-30 MED ORDER — CARBIDOPA-LEVODOPA 10-100 MG PO TABS: 1.0000 | ORAL_TABLET | Freq: Every day | ORAL | Status: DC

## 2019-04-30 MED ORDER — PROPOFOL 10 MG/ML IV BOLUS
INTRAVENOUS | Status: DC | PRN
  Administered 2019-04-30: 130 mg via INTRAVENOUS

## 2019-04-30 MED ORDER — CEFAZOLIN SODIUM-DEXTROSE 2-4 GM/100ML-% IV SOLN
2.0000 g | INTRAVENOUS | Status: AC
  Administered 2019-04-30: 2 g via INTRAVENOUS
  Filled 2019-04-30: qty 100

## 2019-04-30 MED ORDER — METHOCARBAMOL 500 MG PO TABS
500.0000 mg | ORAL_TABLET | Freq: Three times a day (TID) | ORAL | Status: DC | PRN
  Administered 2019-04-30 (×2): 500 mg via ORAL
  Filled 2019-04-30 (×2): qty 1

## 2019-04-30 MED ORDER — ACETAMINOPHEN 500 MG PO TABS
1000.0000 mg | ORAL_TABLET | ORAL | Status: AC
  Administered 2019-04-30: 1000 mg via ORAL
  Filled 2019-04-30: qty 2

## 2019-04-30 MED ORDER — SODIUM CHLORIDE 0.9 % IV SOLN
INTRAVENOUS | Status: DC | PRN
  Administered 2019-04-30: 08:00:00 40 ug/min via INTRAVENOUS

## 2019-04-30 MED ORDER — METHYLENE BLUE 0.5 % INJ SOLN
INTRAVENOUS | Status: AC
  Filled 2019-04-30: qty 10

## 2019-04-30 SURGICAL SUPPLY — 59 items
APPLIER CLIP 9.375 MED OPEN (MISCELLANEOUS) ×3
BINDER BREAST LRG (GAUZE/BANDAGES/DRESSINGS) IMPLANT
BINDER BREAST XLRG (GAUZE/BANDAGES/DRESSINGS) ×3 IMPLANT
CANISTER SUCT 3000ML PPV (MISCELLANEOUS) ×3 IMPLANT
CHLORAPREP W/TINT 26 (MISCELLANEOUS) ×3 IMPLANT
CLIP APPLIE 9.375 MED OPEN (MISCELLANEOUS) ×1 IMPLANT
CLOSURE WOUND 1/2 X4 (GAUZE/BANDAGES/DRESSINGS) ×1
CONT SPEC 4OZ CLIKSEAL STRL BL (MISCELLANEOUS) ×3 IMPLANT
COVER PROBE W GEL 5X96 (DRAPES) ×3 IMPLANT
COVER SURGICAL LIGHT HANDLE (MISCELLANEOUS) ×3 IMPLANT
COVER WAND RF STERILE (DRAPES) ×3 IMPLANT
DERMABOND ADVANCED (GAUZE/BANDAGES/DRESSINGS) ×2
DERMABOND ADVANCED .7 DNX12 (GAUZE/BANDAGES/DRESSINGS) ×1 IMPLANT
DRAIN CHANNEL 19F RND (DRAIN) ×3 IMPLANT
DRAPE LAPAROSCOPIC ABDOMINAL (DRAPES) ×3 IMPLANT
DRSG PAD ABDOMINAL 8X10 ST (GAUZE/BANDAGES/DRESSINGS) ×6 IMPLANT
ELECT BLADE 4.0 EZ CLEAN MEGAD (MISCELLANEOUS) ×3
ELECT CAUTERY BLADE 6.4 (BLADE) ×3 IMPLANT
ELECT REM PT RETURN 9FT ADLT (ELECTROSURGICAL) ×3
ELECTRODE BLDE 4.0 EZ CLN MEGD (MISCELLANEOUS) ×1 IMPLANT
ELECTRODE REM PT RTRN 9FT ADLT (ELECTROSURGICAL) ×1 IMPLANT
EVACUATOR SILICONE 100CC (DRAIN) ×3 IMPLANT
GAUZE SPONGE 4X4 12PLY STRL (GAUZE/BANDAGES/DRESSINGS) ×3 IMPLANT
GAUZE SPONGE 4X4 12PLY STRL LF (GAUZE/BANDAGES/DRESSINGS) ×3 IMPLANT
GLOVE BIO SURGEON STRL SZ7 (GLOVE) ×3 IMPLANT
GLOVE BIOGEL PI IND STRL 6.5 (GLOVE) ×1 IMPLANT
GLOVE BIOGEL PI IND STRL 7.5 (GLOVE) ×1 IMPLANT
GLOVE BIOGEL PI INDICATOR 6.5 (GLOVE) ×2
GLOVE BIOGEL PI INDICATOR 7.5 (GLOVE) ×2
GLOVE SURG SS PI 6.5 STRL IVOR (GLOVE) ×3 IMPLANT
GOWN STRL REUS W/ TWL LRG LVL3 (GOWN DISPOSABLE) ×3 IMPLANT
GOWN STRL REUS W/TWL LRG LVL3 (GOWN DISPOSABLE) ×6
KIT BASIN OR (CUSTOM PROCEDURE TRAY) ×3 IMPLANT
KIT TURNOVER KIT B (KITS) ×3 IMPLANT
MARKER SKIN DUAL TIP RULER LAB (MISCELLANEOUS) ×3 IMPLANT
NEEDLE 18GX1X1/2 (RX/OR ONLY) (NEEDLE) ×3 IMPLANT
NEEDLE FILTER BLUNT 18X 1/2SAF (NEEDLE) ×2
NEEDLE FILTER BLUNT 18X1 1/2 (NEEDLE) ×1 IMPLANT
NEEDLE HYPO 25GX1X1/2 BEV (NEEDLE) ×3 IMPLANT
NS IRRIG 1000ML POUR BTL (IV SOLUTION) ×3 IMPLANT
PACK GENERAL/GYN (CUSTOM PROCEDURE TRAY) ×3 IMPLANT
PAD ARMBOARD 7.5X6 YLW CONV (MISCELLANEOUS) ×3 IMPLANT
PENCIL SMOKE EVACUATOR (MISCELLANEOUS) ×3 IMPLANT
PIN SAFETY STERILE (MISCELLANEOUS) ×3 IMPLANT
SPECIMEN JAR X LARGE (MISCELLANEOUS) ×3 IMPLANT
SPONGE LAP 18X18 RF (DISPOSABLE) ×3 IMPLANT
STAPLER VISISTAT 35W (STAPLE) ×3 IMPLANT
STRIP CLOSURE SKIN 1/2X4 (GAUZE/BANDAGES/DRESSINGS) ×2 IMPLANT
SUT ETHILON 2 0 FS 18 (SUTURE) ×3 IMPLANT
SUT MON AB 4-0 PC3 18 (SUTURE) ×3 IMPLANT
SUT SILK 2 0 SH (SUTURE) IMPLANT
SUT VIC AB 2-0 SH 18 (SUTURE) ×3 IMPLANT
SUT VIC AB 3-0 54X BRD REEL (SUTURE) ×1 IMPLANT
SUT VIC AB 3-0 BRD 54 (SUTURE) ×2
SUT VIC AB 3-0 SH 18 (SUTURE) ×3 IMPLANT
SUT VIC AB 3-0 SH 8-18 (SUTURE) ×6 IMPLANT
SYR CONTROL 10ML LL (SYRINGE) ×3 IMPLANT
TOWEL GREEN STERILE (TOWEL DISPOSABLE) ×3 IMPLANT
TOWEL GREEN STERILE FF (TOWEL DISPOSABLE) ×3 IMPLANT

## 2019-04-30 NOTE — Progress Notes (Signed)
Patient BP 93/56. Dr. Donne Hazel notified. Will continue to monitor patient

## 2019-04-30 NOTE — Anesthesia Procedure Notes (Signed)
Procedure Name: LMA Insertion Date/Time: 04/30/2019 7:34 AM Performed by: Gwyndolyn Saxon, CRNA Pre-anesthesia Checklist: Patient identified, Emergency Drugs available, Suction available and Patient being monitored Patient Re-evaluated:Patient Re-evaluated prior to induction Oxygen Delivery Method: Circle system utilized Preoxygenation: Pre-oxygenation with 100% oxygen Induction Type: IV induction Ventilation: Mask ventilation without difficulty LMA: LMA inserted LMA Size: 3.0 Number of attempts: 1 Placement Confirmation: positive ETCO2 and breath sounds checked- equal and bilateral Tube secured with: Tape Dental Injury: Teeth and Oropharynx as per pre-operative assessment

## 2019-04-30 NOTE — Progress Notes (Signed)
BP 88/60. Jp drain output 140cc since 11am. Dr. Grandville Silos notified  and orders received.

## 2019-04-30 NOTE — Anesthesia Procedure Notes (Signed)
Anesthesia Regional Block: Pectoralis block   Pre-Anesthetic Checklist: ,, timeout performed, Correct Patient, Correct Site, Correct Laterality, Correct Procedure, Correct Position, site marked, Risks and benefits discussed,  Surgical consent,  Pre-op evaluation,  At surgeon's request and post-op pain management  Laterality: Right  Prep: chloraprep       Needles:  Injection technique: Single-shot  Needle Type: Echogenic Stimulator Needle     Needle Length: 10cm  Needle Gauge: 21     Additional Needles:   Procedures:,,,, ultrasound used (permanent image in chart),,,,  Narrative:  Start time: 04/30/2019 7:10 AM End time: 04/30/2019 7:14 AM Injection made incrementally with aspirations every 5 mL.  Performed by: Personally  Anesthesiologist: Lidia Collum, MD  Additional Notes: Monitors applied. Injection made in 5cc increments. No resistance to injection. Good needle visualization. Patient tolerated procedure well.

## 2019-04-30 NOTE — Interval H&P Note (Signed)
History and Physical Interval Note:  04/30/2019 7:00 AM  Debbie Watts  has presented today for surgery, with the diagnosis of RIGHT BREAST CANCER.  The various methods of treatment have been discussed with the patient and family. After consideration of risks, benefits and other options for treatment, the patient has consented to  Procedure(s): RIGHT MASTECTOMY WITH  RIGHT AXILLARY SENTINEL LYMPH NODE BIOPSY INJECT BLUE DYE (Right) as a surgical intervention.  The patient's history has been reviewed, patient examined, no change in status, stable for surgery.  I have reviewed the patient's chart and labs.  Questions were answered to the patient's satisfaction.     Rolm Bookbinder

## 2019-04-30 NOTE — Op Note (Signed)
Preoperative diagnosis: Right breast cancer that is HER-2/neu positive status post primary chemotherapy Postoperative diagnosis: Same as above Procedure: 1.  Right mastectomy 2.  Right deep axillary sentinel lymph node biopsy 3.  Injection of blue dye for sentinel lymph node identification Surgeon: Dr. Serita Grammes Anesthesia: General with pectoral block Specimens: 1.  Right breast tissue marked short superior, long lateral 2.  Right axillary sentinel lymph nodes with highest count of 448 Drains: 19 [place] Blake drain Complications: None Estimated blood loss: 30 cc Sponge needle count was correct at completion Disposition to recovery in stable condition  Indications: This a 58 year old female presented in February with a right breast cancer.  She did not have all of the calcifications biopsied and had 2 different masses on her mammogram.  Both of these are invasive ductal carcinoma grade 2-3.  They are triple positive.  Her nodes have remained negative.  She underwent primary [place] P.  She had a positive response.  We discussed all of her options and she is elected undergo a mastectomy with sentinel lymph node biopsy.  Procedure: After informed consent was obtained the patient first underwent a pectoral block as well as of injection of technetium in a standard periareolar fashion.  She was given antibiotics.  SCDs were in place.  She was then placed under general anesthesia without complication.  She was prepped and draped in the standard sterile surgical fashion.  A surgical timeout was then performed.  I first injected methylene blue dye-saline mixture in retroareolar position.   I made an elliptical incision with the inferior limb being the inframammary fold.  I took care to enter and hopefully remove all the axillary fat pad to avoid a dogear.  I then created flaps to the clavicle, parasternal region, inframammary fold, latissimus laterally.  I then remove the breast and the pectoralis  fashion from the pectoralis muscle.  I noted several blue lymph nodes that were also radioactive.  I excised these.  There was no more remaining blue or radioactive nodes present.  I then obtained hemostasis.  Irrigation was performed.  I placed a 63 [place] Blake drain and secured this with a 2-0 nylon suture.  I then advanced the lateral portion of this incision by suturing this to the edge of the pectoralis muscle with multiple 2-0 Vicryl sutures in order to reduce the lateral tissue.  Once I done this I then closed the dermis with 3-0 Vicryl the skin with 4-0 Monocryl.  Glue was placed.  A dressing was placed.  She tolerated this well was extubated and transferred to recovery in stable condition.

## 2019-04-30 NOTE — Transfer of Care (Signed)
Immediate Anesthesia Transfer of Care Note  Patient: [name]  Procedure(s) Performed: RIGHT MASTECTOMY WITH  RIGHT AXILLARY SENTINEL LYMPH NODE BIOPSY INJECT BLUE DYE (Right Breast)  Patient Location: PACU  Anesthesia Type:General and Regional  Level of Consciousness: drowsy  Airway & Oxygen Therapy: Patient Spontanous Breathing and Patient connected to face mask oxygen  Post-op Assessment: Report given to RN and Post -op Vital signs reviewed and stable  Post vital signs: Reviewed and stable  Last Vitals:  Vitals Value Taken Time  BP 131/54 04/30/19 0912  Temp 36.1 C 04/30/19 0912  Pulse 77 04/30/19 0914  Resp 13 04/30/19 0914  SpO2 100 % 04/30/19 0914  Vitals shown include unvalidated device data.  Last Pain:  Vitals:   04/30/19 0616  TempSrc:   PainSc: 0-No pain         Complications: No apparent anesthesia complications

## 2019-04-30 NOTE — H&P (Signed)
Debbie Watts is an 58 y.o. female.   Chief Complaint: breast cancer HPI:  58 year old female who presented with breast cancer in february of this year and was seen in [place] by Dr Excell Seltzer. she has recently finished chemotherapy and Dr Excell Seltzer is out so she is seeing me today. I told her that if he returns in time I would return her to his care. She had presented for a screening mamogram revealing a possible mass in the upper outer right breast. Subsequent imaging included diagnostic mamogram showing and irregular and spiculated mass with calcifications at the 11:00 position measuring approximately 2 cm in the right breast as well as 2 separate small groups of heterogeneous calcifications separate from this in the upper outer breast and ultrasound showing 2 hypoechoic masses, one measuring 2.9 cm at the 12:00 position 4 cm from the nipple and a 1 cm mass 11:00 position 4 cm from the nipple. An ultrasound guided breast biopsy was performed on the 2 hypoechoic masses with pathology revealing invasive ductal carcinoma of the breast. the biopsy clips were noted to be 2.5 cm apart. both breast biopsies were IDC grade 2-3. they were both er/pr pos and her 2 positive, ki was 10-15%. [place] of her axilla was negative. she had initial mri that shows a 2.3 cm enhancing mass in right breast and a signal void in other biopsy site. left breast was normal and no abnormal nodes. she was decided to undergo primary tchp. she had port placed by Dr Excell Seltzer. she has completed chemotherapy two days ago. she did well with this. she has ms and this has done fine. she has repeat mri that shows a new left breast mass and positive reponse to chemo on right side with mass measuring 12x4 mm now. nodes all normal.  Past Medical History:  Diagnosis Date  . Arthritis   . Asthma   . Cancer ([place])    chemo for breast ca  x43mos  . Depression   . Deviated nasal septum   . Hearing loss 06/11/2014   Bilateral  . Herniated  cervical disc   . Hypertension   . Migraine   . Miscarriage    2  . MS (multiple sclerosis) ([place])     Past Surgical History:  Procedure Laterality Date  . NASAL SEPTUM SURGERY    . spinal injections      Family History  Adopted: Yes  Problem Relation Age of Onset  . Cancer Maternal Uncle        throat   Social History:  reports that she has been smoking cigarettes. She has a 22.00 pack-year smoking history. She has never used smokeless tobacco. She reports current alcohol use. She reports current drug use. Drug: Marijuana.  Allergies:  Allergies  Allergen Reactions  . Procaine Shortness Of Breath    Medications Prior to Admission  Medication Sig Dispense Refill  . aspirin-acetaminophen-caffeine (EXCEDRIN MIGRAINE) 250-250-65 MG tablet Take 2 tablets by mouth every 6 (six) hours as needed for migraine.    . carbidopa-levodopa (SINEMET IR) 10-100 MG tablet Take 1 tablet by mouth at bedtime. 90 tablet 3  . Dimethyl Fumarate 240 MG CPDR Take 1 capsule (240 mg total) by mouth 2 (two) times daily. 180 capsule 3  . diphenhydrAMINE (BENADRYL) 25 MG tablet Take 25 mg by mouth daily.    . fluticasone (FLONASE) 50 MCG/ACT nasal spray Place 2 sprays into both nostrils at bedtime.    . gabapentin (NEURONTIN) 300 MG capsule Take  2 capsules (600 mg total) by mouth 2 (two) times daily. 360 capsule 3  . lidocaine-prilocaine (EMLA) cream Apply to affected area once (Patient taking differently: Apply 1 application topically daily as needed (port access). ) 30 g 3  . lisinopril (PRINIVIL,ZESTRIL) 5 MG tablet TAKE 1 TABLET BY MOUTH EVERY DAY (Patient taking differently: Take 5 mg by mouth daily. ) 90 tablet 3  . loperamide (IMODIUM A-D) 2 MG tablet Take 2-4 mg by mouth 3 (three) times daily as needed for diarrhea or loose stools.    . Multiple Vitamin (MULTIVITAMIN WITH MINERALS) TABS tablet Take 2 tablets by mouth daily.    Marland Kitchen omeprazole (PRILOSEC OTC) 20 MG tablet Take 20 mg by mouth daily as  needed (acid reflux).    Marland Kitchen oxyCODONE-acetaminophen (PERCOCET/ROXICET) 5-325 MG tablet Take 1 tablet by mouth every 8 (eight) hours as needed for severe pain. 30 tablet 0  . PARoxetine (PAXIL) 20 MG tablet Take 1 tablet (20 mg total) by mouth daily. 90 tablet 3  . prochlorperazine (COMPAZINE) 10 MG tablet TAKE 1 TABLET (10 MG TOTAL) BY MOUTH EVERY 6 (SIX) HOURS AS NEEDED (NAUSEA OR VOMITING). 30 tablet 1  . traMADol (ULTRAM) 50 MG tablet TAKE 1 TABLET BY MOUTH TWICE A DAY AS NEEDED (Patient taking differently: Take 50 mg by mouth 2 (two) times daily as needed for moderate pain. ) 60 tablet 3  . VENTOLIN HFA 108 (90 Base) MCG/ACT inhaler Inhale 1-2 puffs into the lungs every 6 (six) hours as needed for wheezing or shortness of breath. (Patient taking differently: Inhale 2 puffs into the lungs every 6 (six) hours as needed for wheezing or shortness of breath. ) 1 Inhaler 3  . betamethasone valerate lotion (VALISONE) 0.1 % Apply 1 application topically daily as needed for irritation.     Marland Kitchen dexamethasone (DECADRON) 4 MG tablet TAKE 1 TABLET BY MOUTH DAILY. TAKE 1 TABLET DAY BEFORE CHEMO AND 1 TABLET DAY AFTER CHEMO WITH FOOD (Patient not taking: Reported on 04/20/2019) 12 tablet 0  . diclofenac (VOLTAREN) 75 MG EC tablet TAKE 1 TABLET BY MOUTH TWICE A DAY (Patient taking differently: Take 75 mg by mouth 2 (two) times daily. ) 60 tablet 5  . fluconazole (DIFLUCAN) 100 MG tablet TAKE 1 TABLET BY MOUTH EVERY DAY (Patient not taking: Reported on 04/20/2019) 30 tablet 0  . LORazepam (ATIVAN) 0.5 MG tablet Take 1 tablet (0.5 mg total) by mouth at bedtime as needed (Nausea or vomiting). (Patient not taking: Reported on 04/20/2019) 30 tablet 0  . ondansetron (ZOFRAN) 8 MG tablet TAKE 1 TABLET BY MOUTH 2 TIMES DAILY AS NEEDED. BEGIN 4 DAYS AFTER CHEMOTHERAPY. (Patient not taking: Reported on 04/20/2019) 30 tablet 1    No results found for this or any previous visit (from the past 48 hour(s)). No results  found.  Review of Systems  All other systems reviewed and are negative.   Blood pressure (!) 136/59, pulse 79, temperature 97.6 F (36.4 C), temperature source Oral, resp. rate 18, SpO2 98 %. Physical Exam  General Mental Status-Alert. Orientation-Oriented X3. Breast Nipples-No Discharge. Breast Lump-No Palpable Breast Mass. Lymphatic Axillary General Axillary Region: Bilateral - Description - Normal. Note: no [place] adenopathy cv rrr Lungs clear   Assessment/Plan MALIGNANT NEOPLASM OF RIGHT BREAST, STAGE 1, ESTROGEN RECEPTOR POSITIVE (C50.911) Right mastectomy, right ax sn biopsy  I think she has positive response to chemotherapy we discussed sn biopsy with blue dye and radiotracer. we discussed risk of lymphedema and shoulder dysfunction as  well as rationale for node biopsy. she is agreeable. she did not ever have extra calcifications biopsied before as I think she was leaning to mastectomy as she is now . however with good response she is asking about lumpectomy. I will need to discuss with radiology if this is feasible given the lack of those biopsies. I think she likely will get mastectomy due to her desire to avoid radiotherapy. we discused mastectomy, hosopital stay and her recovery. we discussed risks, drain placement as well.   Rolm Bookbinder, MD 04/30/2019, 6:58 AM

## 2019-04-30 NOTE — Anesthesia Postprocedure Evaluation (Signed)
Anesthesia Post Note  Patient: Debbie Watts  Procedure(s) Performed: RIGHT MASTECTOMY WITH  RIGHT AXILLARY SENTINEL LYMPH NODE BIOPSY INJECT BLUE DYE (Right Breast)     Patient location during evaluation: PACU Anesthesia Type: General Level of consciousness: awake and alert Pain management: pain level controlled Vital Signs Assessment: post-procedure vital signs reviewed and stable Respiratory status: spontaneous breathing, nonlabored ventilation and respiratory function stable Cardiovascular status: blood pressure returned to baseline and stable Postop Assessment: no apparent nausea or vomiting Anesthetic complications: no    Last Vitals:  Vitals:   04/30/19 1018 04/30/19 1040  BP: 107/73 117/81  Pulse: 86 90  Resp: 14 14  Temp: (!) 36.1 C 36.4 C  SpO2: 95% 93%    Last Pain:  Vitals:   04/30/19 1040  TempSrc: Axillary  PainSc:                  Lidia Collum

## 2019-05-01 ENCOUNTER — Observation Stay (HOSPITAL_COMMUNITY): Payer: Medicare HMO | Admitting: Certified Registered"

## 2019-05-01 ENCOUNTER — Encounter (HOSPITAL_COMMUNITY): Payer: Self-pay | Admitting: General Surgery

## 2019-05-01 ENCOUNTER — Encounter (HOSPITAL_COMMUNITY)
Admission: AD | Disposition: A | Discharge status: Home / Self Care | Payer: Self-pay | Source: Home / Self Care | Attending: General Surgery

## 2019-05-01 DIAGNOSIS — C50411 Malignant neoplasm of upper-outer quadrant of right female breast: Secondary | ICD-10-CM | POA: Diagnosis present

## 2019-05-01 DIAGNOSIS — Z884 Allergy status to anesthetic agent status: Secondary | ICD-10-CM | POA: Diagnosis not present

## 2019-05-01 DIAGNOSIS — Z800 Family history of malignant neoplasm of digestive organs: Secondary | ICD-10-CM | POA: Diagnosis not present

## 2019-05-01 DIAGNOSIS — Z79899 Other long term (current) drug therapy: Secondary | ICD-10-CM | POA: Diagnosis not present

## 2019-05-01 DIAGNOSIS — J45909 Unspecified asthma, uncomplicated: Secondary | ICD-10-CM | POA: Diagnosis present

## 2019-05-01 DIAGNOSIS — Z7982 Long term (current) use of aspirin: Secondary | ICD-10-CM | POA: Diagnosis not present

## 2019-05-01 DIAGNOSIS — Z791 Long term (current) use of non-steroidal anti-inflammatories (NSAID): Secondary | ICD-10-CM | POA: Diagnosis not present

## 2019-05-01 DIAGNOSIS — Z170 Estrogen receptor positive status [ER+]: Secondary | ICD-10-CM | POA: Diagnosis not present

## 2019-05-01 DIAGNOSIS — G35 Multiple sclerosis: Secondary | ICD-10-CM | POA: Diagnosis present

## 2019-05-01 DIAGNOSIS — H9193 Unspecified hearing loss, bilateral: Secondary | ICD-10-CM | POA: Diagnosis present

## 2019-05-01 DIAGNOSIS — M1990 Unspecified osteoarthritis, unspecified site: Secondary | ICD-10-CM | POA: Diagnosis present

## 2019-05-01 DIAGNOSIS — Z9221 Personal history of antineoplastic chemotherapy: Secondary | ICD-10-CM | POA: Diagnosis not present

## 2019-05-01 DIAGNOSIS — F329 Major depressive disorder, single episode, unspecified: Secondary | ICD-10-CM | POA: Diagnosis present

## 2019-05-01 DIAGNOSIS — F17210 Nicotine dependence, cigarettes, uncomplicated: Secondary | ICD-10-CM | POA: Diagnosis present

## 2019-05-01 DIAGNOSIS — C50911 Malignant neoplasm of unspecified site of right female breast: Secondary | ICD-10-CM | POA: Diagnosis present

## 2019-05-01 DIAGNOSIS — I10 Essential (primary) hypertension: Secondary | ICD-10-CM | POA: Diagnosis present

## 2019-05-01 HISTORY — PX: HEMATOMA EVACUATION: SHX5118

## 2019-05-01 LAB — CBC WITH DIFFERENTIAL/PLATELET
Abs Immature Granulocytes: 0.05 10*3/uL (ref 0.00–0.07)
Basophils Absolute: 0 10*3/uL (ref 0.0–0.1)
Basophils Relative: 0 %
Eosinophils Absolute: 0.1 10*3/uL (ref 0.0–0.5)
Eosinophils Relative: 1 %
HCT: 26.2 % — ABNORMAL LOW (ref 36.0–46.0)
Hemoglobin: 8.8 g/dL — ABNORMAL LOW (ref 12.0–15.0)
Immature Granulocytes: 1 %
Lymphocytes Relative: 6 %
Lymphs Abs: 0.6 10*3/uL — ABNORMAL LOW (ref 0.7–4.0)
MCH: 33 pg (ref 26.0–34.0)
MCHC: 33.6 g/dL (ref 30.0–36.0)
MCV: 98.1 fL (ref 80.0–100.0)
Monocytes Absolute: 0.4 10*3/uL (ref 0.1–1.0)
Monocytes Relative: 4 %
Neutro Abs: 9 10*3/uL — ABNORMAL HIGH (ref 1.7–7.7)
Neutrophils Relative %: 88 %
Platelets: 202 10*3/uL (ref 150–400)
RBC: 2.67 MIL/uL — ABNORMAL LOW (ref 3.87–5.11)
RDW: 19 % — ABNORMAL HIGH (ref 11.5–15.5)
WBC: 10 10*3/uL (ref 4.0–10.5)
nRBC: 0 % (ref 0.0–0.2)

## 2019-05-01 LAB — CBC
HCT: 21.9 % — ABNORMAL LOW (ref 36.0–46.0)
HCT: 20.6 % — ABNORMAL LOW (ref 36.0–46.0)
Hemoglobin: 7.1 g/dL — ABNORMAL LOW (ref 12.0–15.0)
Hemoglobin: 6.7 g/dL — CL (ref 12.0–15.0)
MCH: 34.6 pg — ABNORMAL HIGH (ref 26.0–34.0)
MCH: 34.5 pg — ABNORMAL HIGH (ref 26.0–34.0)
MCHC: 32.4 g/dL (ref 30.0–36.0)
MCHC: 32.5 g/dL (ref 30.0–36.0)
MCV: 106.8 fL — ABNORMAL HIGH (ref 80.0–100.0)
MCV: 106.2 fL — ABNORMAL HIGH (ref 80.0–100.0)
Platelets: 214 10*3/uL (ref 150–400)
Platelets: 189 10*3/uL (ref 150–400)
RBC: 2.05 MIL/uL — ABNORMAL LOW (ref 3.87–5.11)
RBC: 1.94 MIL/uL — ABNORMAL LOW (ref 3.87–5.11)
RDW: 16 % — ABNORMAL HIGH (ref 11.5–15.5)
RDW: 16.1 % — ABNORMAL HIGH (ref 11.5–15.5)
WBC: 4.6 10*3/uL (ref 4.0–10.5)
WBC: 4.9 10*3/uL (ref 4.0–10.5)
nRBC: 0 % (ref 0.0–0.2)
nRBC: 0 % (ref 0.0–0.2)

## 2019-05-01 LAB — POCT I-STAT 4, (NA,K, GLUC, HGB,HCT)
Glucose, Bld: 83 mg/dL (ref 70–99)
HCT: 31 % — ABNORMAL LOW (ref 36.0–46.0)
Hemoglobin: 10.5 g/dL — ABNORMAL LOW (ref 12.0–15.0)
Potassium: 3.3 mmol/L — ABNORMAL LOW (ref 3.5–5.1)
Sodium: 140 mmol/L (ref 135–145)

## 2019-05-01 LAB — ABO/RH: ABO/RH(D): O POS

## 2019-05-01 LAB — PREPARE RBC (CROSSMATCH)

## 2019-05-01 SURGERY — EVACUATION HEMATOMA
Anesthesia: General | Site: Breast | Laterality: Right

## 2019-05-01 MED ORDER — PHENYLEPHRINE 40 MCG/ML (10ML) SYRINGE FOR IV PUSH (FOR BLOOD PRESSURE SUPPORT)
PREFILLED_SYRINGE | INTRAVENOUS | Status: AC
  Filled 2019-05-01: qty 10

## 2019-05-01 MED ORDER — FENTANYL CITRATE (PF) 250 MCG/5ML IJ SOLN
INTRAMUSCULAR | Status: DC | PRN
  Administered 2019-05-01 (×3): 50 ug via INTRAVENOUS

## 2019-05-01 MED ORDER — PHENYLEPHRINE 40 MCG/ML (10ML) SYRINGE FOR IV PUSH (FOR BLOOD PRESSURE SUPPORT)
PREFILLED_SYRINGE | INTRAVENOUS | Status: DC | PRN
  Administered 2019-05-01: 80 ug via INTRAVENOUS

## 2019-05-01 MED ORDER — MIDAZOLAM HCL 2 MG/2ML IJ SOLN
INTRAMUSCULAR | Status: DC | PRN
  Administered 2019-05-01: 2 mg via INTRAVENOUS

## 2019-05-01 MED ORDER — OXYCODONE HCL 5 MG PO TABS: 5.0000 mg | ORAL_TABLET | Freq: Four times a day (QID) | ORAL | 0 refills | Status: DC | PRN

## 2019-05-01 MED ORDER — HEMOSTATIC AGENTS (NO CHARGE) OPTIME
TOPICAL | Status: DC | PRN
  Administered 2019-05-01: 1 via TOPICAL

## 2019-05-01 MED ORDER — DIPHENHYDRAMINE HCL 50 MG/ML IJ SOLN
INTRAMUSCULAR | Status: DC | PRN
  Administered 2019-05-01: 12.5 mg via INTRAVENOUS

## 2019-05-01 MED ORDER — MIDAZOLAM HCL 2 MG/2ML IJ SOLN
INTRAMUSCULAR | Status: AC
  Filled 2019-05-01: qty 2

## 2019-05-01 MED ORDER — SODIUM CHLORIDE 0.9% IV SOLUTION
Freq: Once | INTRAVENOUS | Status: AC
  Administered 2019-05-01: 16:00:00 via INTRAVENOUS

## 2019-05-01 MED ORDER — DEXAMETHASONE SODIUM PHOSPHATE 10 MG/ML IJ SOLN
INTRAMUSCULAR | Status: AC
  Filled 2019-05-01: qty 1

## 2019-05-01 MED ORDER — DIPHENHYDRAMINE HCL 50 MG/ML IJ SOLN
INTRAMUSCULAR | Status: AC
  Filled 2019-05-01: qty 1

## 2019-05-01 MED ORDER — 0.9 % SODIUM CHLORIDE (POUR BTL) OPTIME
TOPICAL | Status: DC | PRN
  Administered 2019-05-01: 1000 mL

## 2019-05-01 MED ORDER — BUPIVACAINE-EPINEPHRINE (PF) 0.25% -1:200000 IJ SOLN
INTRAMUSCULAR | Status: AC
  Filled 2019-05-01: qty 30

## 2019-05-01 MED ORDER — FENTANYL CITRATE (PF) 250 MCG/5ML IJ SOLN
INTRAMUSCULAR | Status: AC
  Filled 2019-05-01: qty 5

## 2019-05-01 MED ORDER — PROPOFOL 10 MG/ML IV BOLUS
INTRAVENOUS | Status: DC | PRN
  Administered 2019-05-01: 40 mg via INTRAVENOUS
  Administered 2019-05-01: 110 mg via INTRAVENOUS

## 2019-05-01 MED ORDER — ONDANSETRON HCL 4 MG/2ML IJ SOLN
INTRAMUSCULAR | Status: AC
  Filled 2019-05-01: qty 2

## 2019-05-01 MED ORDER — SODIUM CHLORIDE (PF) 0.9 % IJ SOLN
INTRAVENOUS | Status: DC | PRN
  Administered 2019-04-30: 08:00:00 3 mL via INTRAMUSCULAR

## 2019-05-01 MED ORDER — PROMETHAZINE HCL 25 MG/ML IJ SOLN: 6.2500 mg | INTRAMUSCULAR | Status: DC | PRN

## 2019-05-01 MED ORDER — ACETAMINOPHEN 10 MG/ML IV SOLN: 1000.0000 mg | Freq: Once | INTRAVENOUS | Status: DC | PRN

## 2019-05-01 MED ORDER — LACTATED RINGERS IV SOLN
INTRAVENOUS | Status: DC | PRN
  Administered 2019-05-01: 13:00:00 via INTRAVENOUS

## 2019-05-01 MED ORDER — LIDOCAINE 2% (20 MG/ML) 5 ML SYRINGE
INTRAMUSCULAR | Status: AC
  Filled 2019-05-01: qty 5

## 2019-05-01 MED ORDER — OXYCODONE HCL 5 MG/5ML PO SOLN: 5.0000 mg | Freq: Once | ORAL | Status: AC | PRN

## 2019-05-01 MED ORDER — LACTATED RINGERS IV SOLN
INTRAVENOUS | Status: DC
  Administered 2019-05-01: 13:00:00 via INTRAVENOUS

## 2019-05-01 MED ORDER — OXYCODONE HCL 5 MG PO TABS
5.0000 mg | ORAL_TABLET | Freq: Once | ORAL | Status: AC | PRN
  Administered 2019-05-01: 5 mg via ORAL

## 2019-05-01 MED ORDER — DEXAMETHASONE SODIUM PHOSPHATE 10 MG/ML IJ SOLN
INTRAMUSCULAR | Status: DC | PRN
  Administered 2019-05-01: 5 mg via INTRAVENOUS

## 2019-05-01 MED ORDER — OXYCODONE HCL 5 MG PO TABS
ORAL_TABLET | ORAL | Status: AC
  Filled 2019-05-01: qty 1

## 2019-05-01 MED ORDER — FENTANYL CITRATE (PF) 100 MCG/2ML IJ SOLN
INTRAMUSCULAR | Status: AC
  Filled 2019-05-01: qty 2

## 2019-05-01 MED ORDER — FENTANYL CITRATE (PF) 100 MCG/2ML IJ SOLN
25.0000 ug | INTRAMUSCULAR | Status: DC | PRN
  Administered 2019-05-01: 50 ug via INTRAVENOUS

## 2019-05-01 MED ORDER — SUCCINYLCHOLINE CHLORIDE 20 MG/ML IJ SOLN
INTRAMUSCULAR | Status: DC | PRN
  Administered 2019-05-01: 100 mg via INTRAVENOUS

## 2019-05-01 MED ORDER — CEFAZOLIN SODIUM-DEXTROSE 2-3 GM-%(50ML) IV SOLR
INTRAVENOUS | Status: DC | PRN
  Administered 2019-05-01: 2 g via INTRAVENOUS

## 2019-05-01 MED ORDER — ONDANSETRON HCL 4 MG/2ML IJ SOLN
INTRAMUSCULAR | Status: DC | PRN
  Administered 2019-05-01: 4 mg via INTRAVENOUS

## 2019-05-01 MED ORDER — LIDOCAINE 2% (20 MG/ML) 5 ML SYRINGE
INTRAMUSCULAR | Status: DC | PRN
  Administered 2019-05-01: 40 mg via INTRAVENOUS

## 2019-05-01 SURGICAL SUPPLY — 40 items
BINDER BREAST LRG (GAUZE/BANDAGES/DRESSINGS) ×1 IMPLANT
BIOPATCH RED 1 DISK 7.0 (GAUZE/BANDAGES/DRESSINGS) ×2 IMPLANT
BNDG GAUZE ELAST 4 BULKY (GAUZE/BANDAGES/DRESSINGS) IMPLANT
CANISTER SUCT 3000ML PPV (MISCELLANEOUS) ×2 IMPLANT
COVER SURGICAL LIGHT HANDLE (MISCELLANEOUS) ×2 IMPLANT
COVER WAND RF STERILE (DRAPES) ×2 IMPLANT
DERMABOND ADVANCED (GAUZE/BANDAGES/DRESSINGS) ×1
DERMABOND ADVANCED .7 DNX12 (GAUZE/BANDAGES/DRESSINGS) IMPLANT
DRAIN CHANNEL 19F RND (DRAIN) ×2 IMPLANT
DRAPE IMP U-DRAPE 54X76 (DRAPES) IMPLANT
DRAPE LAPAROSCOPIC ABDOMINAL (DRAPES) IMPLANT
DRAPE LAPAROTOMY 100X72 PEDS (DRAPES) IMPLANT
DRSG PAD ABDOMINAL 8X10 ST (GAUZE/BANDAGES/DRESSINGS) ×1 IMPLANT
DRSG TEGADERM 4X4.75 (GAUZE/BANDAGES/DRESSINGS) ×1 IMPLANT
ELECT CAUTERY BLADE 6.4 (BLADE) ×2 IMPLANT
ELECT REM PT RETURN 9FT ADLT (ELECTROSURGICAL) ×2
ELECTRODE REM PT RTRN 9FT ADLT (ELECTROSURGICAL) ×1 IMPLANT
EVACUATOR SILICONE 100CC (DRAIN) ×2 IMPLANT
GAUZE SPONGE 4X4 12PLY STRL (GAUZE/BANDAGES/DRESSINGS) ×1 IMPLANT
GLOVE BIO SURGEON STRL SZ7 (GLOVE) ×2 IMPLANT
GLOVE BIOGEL PI IND STRL 7.5 (GLOVE) ×1 IMPLANT
GLOVE BIOGEL PI INDICATOR 7.5 (GLOVE) ×1
GOWN STRL REUS W/ TWL LRG LVL3 (GOWN DISPOSABLE) ×2 IMPLANT
GOWN STRL REUS W/TWL LRG LVL3 (GOWN DISPOSABLE) ×2
HEMOSTAT ARISTA ABSORB 3G PWDR (HEMOSTASIS) ×5 IMPLANT
KIT BASIN OR (CUSTOM PROCEDURE TRAY) ×2 IMPLANT
KIT TURNOVER KIT B (KITS) ×2 IMPLANT
NS IRRIG 1000ML POUR BTL (IV SOLUTION) ×2 IMPLANT
PACK GENERAL/GYN (CUSTOM PROCEDURE TRAY) ×2 IMPLANT
PAD ARMBOARD 7.5X6 YLW CONV (MISCELLANEOUS) ×2 IMPLANT
PENCIL SMOKE EVACUATOR (MISCELLANEOUS) ×2 IMPLANT
SPONGE LAP 18X18 RF (DISPOSABLE) ×1 IMPLANT
STRIP CLOSURE SKIN 1/2X4 (GAUZE/BANDAGES/DRESSINGS) ×2 IMPLANT
SUT ETHILON 2 0 FS 18 (SUTURE) ×2 IMPLANT
SUT ETHILON 3 0 FSL (SUTURE) ×1 IMPLANT
SUT MNCRL AB 4-0 PS2 18 (SUTURE) ×1 IMPLANT
SUT VIC AB 2-0 SH 18 (SUTURE) ×1 IMPLANT
SUT VIC AB 3-0 SH 8-18 (SUTURE) ×2 IMPLANT
TOWEL GREEN STERILE (TOWEL DISPOSABLE) ×2 IMPLANT
TOWEL GREEN STERILE FF (TOWEL DISPOSABLE) ×2 IMPLANT

## 2019-05-01 NOTE — Progress Notes (Addendum)
CRITICAL VALUE ALERT  Critical Value:  6.7  Date & Time Notied:  05/01/19 @12 :82  Provider Notified: Dr.M. Wakefield  Orders Received/Actions taken: made aware , no orders given

## 2019-05-01 NOTE — Anesthesia Procedure Notes (Addendum)
Procedure Name: Intubation Date/Time: 05/01/2019 1:26 PM Performed by: Alain Marion, CRNA Pre-anesthesia Checklist: Patient identified, Emergency Drugs available, Suction available and Patient being monitored Patient Re-evaluated:Patient Re-evaluated prior to induction Oxygen Delivery Method: Circle System Utilized Preoxygenation: Pre-oxygenation with 100% oxygen Induction Type: IV induction, Rapid sequence and Cricoid Pressure applied Laryngoscope Size: Glidescope and 4 Grade View: Grade I Tube type: Oral Tube size: 7.0 mm Number of attempts: 1 Airway Equipment and Method: Stylet and Oral airway Placement Confirmation: ETT inserted through vocal cords under direct vision,  positive ETCO2 and breath sounds checked- equal and bilateral Secured at: 21 cm Tube secured with: Tape Dental Injury: Teeth and Oropharynx as per pre-operative assessment

## 2019-05-01 NOTE — Progress Notes (Signed)
Nutrition Brief Note  Patient identified on the Malnutrition Screening Tool (MST) Report  Wt Readings from Last 15 Encounters:  04/30/19 64.3 kg  04/25/19 57.7 kg  04/17/19 57.4 kg  04/11/19 58.4 kg  03/27/19 60.6 kg  03/06/19 62.6 kg  02/13/19 63 kg  01/22/19 61.5 kg  01/19/19 62.3 kg  01/02/19 64.1 kg  12/19/18 62.7 kg  12/18/18 63.1 kg  12/12/18 64.6 kg  11/29/18 63 kg  09/19/18 32.6 kg   58 year old female who presented with breast cancer in february of this year and was seen in mdc by Dr Excell Seltzer. she has recently finished chemotherapy and Dr Excell Seltzer is out so she is seeing me today.   7/6- s/p rt mastectomy with lymph node biopsy 7/7- s/p evacuation of rt breast hematoma  Body mass index is 25.93 kg/m. Patient meets criteria for ovrrweight based on current BMI.   Current diet order is regular, patient is consuming approximately 90-100% of meals at this time. Labs and medications reviewed.   No nutrition interventions warranted at this time. If nutrition issues arise, please consult RD.   Lily Kernen A. Jimmye Norman, RD, LDN, [place] Registered Dietitian II Certified Diabetes Care and Education Specialist Pager: [phone] After hours Pager: [phone]

## 2019-05-01 NOTE — Transfer of Care (Signed)
Immediate Anesthesia Transfer of Care Note  Patient: Debbie Watts  Procedure(s) Performed: EVACUATION HEMATOMA (Right Breast)  Patient Location: PACU  Anesthesia Type:General  Level of Consciousness: awake, alert  and oriented  Airway & Oxygen Therapy: Patient Spontanous Breathing and Patient connected to face mask oxygen  Post-op Assessment: Report given to RN and Post -op Vital signs reviewed and stable  Post vital signs: Reviewed and stable  Last Vitals:  Vitals Value Taken Time  BP 165/97   Temp    Pulse 86   Resp 14   SpO2 97     Last Pain:  Vitals:   05/01/19 1305  TempSrc: Oral  PainSc:       Patients Stated Pain Goal: 3 (84/13/24 4010)  Complications: No apparent anesthesia complications

## 2019-05-01 NOTE — Anesthesia Preprocedure Evaluation (Addendum)
Anesthesia Evaluation  Patient identified by MRN, date of birth, ID band Patient awake    Reviewed: Allergy & Precautions, NPO status , Patient's Chart, lab work & pertinent test results  History of Anesthesia Complications Negative for: history of anesthetic complications  Airway Mallampati: II  TM Distance: >3 FB Neck ROM: Full    Dental  (+) Loose,    Pulmonary COPD,  COPD inhaler, Current Smoker,    Pulmonary exam normal        Cardiovascular hypertension, Pt. on medications Normal cardiovascular exam     Neuro/Psych  Headaches, Anxiety Depression MS    GI/Hepatic Neg liver ROS, GERD  Medicated and Controlled,  Endo/Other  negative endocrine ROS  Renal/GU negative Renal ROS     Musculoskeletal  (+) Arthritis ,   Abdominal   Peds  Hematology  (+) anemia , Hgb 6.7, receiving 1u pRBCs in preop   Anesthesia Other Findings Day of surgery medications reviewed with the patient.  Reproductive/Obstetrics                           Anesthesia Physical Anesthesia Plan  ASA: II and emergent  Anesthesia Plan: General   Post-op Pain Management:    Induction: Intravenous, Rapid sequence and Cricoid pressure planned  PONV Risk Score and Plan: 3 and Treatment may vary due to age or medical condition, Ondansetron, Dexamethasone and Midazolam  Airway Management Planned: Oral ETT  Additional Equipment:   Intra-op Plan:   Post-operative Plan: Extubation in OR  Informed Consent: I have reviewed the patients History and Physical, chart, labs and discussed the procedure including the risks, benefits and alternatives for the proposed anesthesia with the patient or authorized representative who has indicated his/her understanding and acceptance.     Dental advisory given  Plan Discussed with: CRNA  Anesthesia Plan Comments: (Pt with loose central right lower incisor, states this was noted for  first time after anesthetic yesterday. Ate full breakfast at 09:00 today, Dr. Donne Hazel aware and plan to proceed. Plan to use Glidescope for RSI today. Daiva Huge, MD)      Anesthesia Quick Evaluation

## 2019-05-01 NOTE — Progress Notes (Signed)
Patient ID: Debbie Watts, female   DOB: 05/30/1961, 58 y.o.   MRN: [id number] hct lower, more blood in drain on recheck. Flaps about same. I think best to evacuate hematoma and ensure no more bleeding. Npo as of now. Await or to schedule. Discussed with patient and significant other Merry Proud

## 2019-05-01 NOTE — Progress Notes (Signed)
Received report from floor, pt had full breakfast between 8:30-9:30am.  Both Dr. Donne Hazel and Dr. Daiva Huge made aware, proceeding with surgery as scheduled.  Dr. Daiva Huge aware of last Hgb, awaiting results of recheck of CBC.

## 2019-05-01 NOTE — Anesthesia Postprocedure Evaluation (Signed)
Anesthesia Post Note  Patient: Debbie Watts  Procedure(s) Performed: EVACUATION HEMATOMA (Right Breast)     Patient location during evaluation: PACU Anesthesia Type: General Level of consciousness: awake and alert Pain management: pain level controlled Vital Signs Assessment: post-procedure vital signs reviewed and stable Respiratory status: spontaneous breathing, nonlabored ventilation and respiratory function stable Cardiovascular status: blood pressure returned to baseline and stable Postop Assessment: no apparent nausea or vomiting Anesthetic complications: no    Last Vitals:  Vitals:   05/01/19 1519 05/01/19 1537  BP: (!) 160/75 (!) 159/78  Pulse: 87 86  Resp: 16 16  Temp: (!) 36.2 C 36.8 C  SpO2: 92% 95%    Last Pain:  Vitals:   05/01/19 1537  TempSrc: Oral  PainSc:                  Brennan Bailey

## 2019-05-01 NOTE — Progress Notes (Signed)
Dr. Daiva Huge made aware of Hbg 6.7.  Order for 1 unit PRBCs obtained.  Blood consent obtained, pt agreeable with plan.  CRNA to assist with rapid infusion.

## 2019-05-01 NOTE — Progress Notes (Signed)
1 Day Post-Op   Subjective/Chief Complaint: Some blood via drain, up and around without any symptoms, tol diet, voiding   Objective: Vital signs in last 24 hours: Temp:  [97 F (36.1 C)-99 F (37.2 C)] 99 F (37.2 C) (07/07 0651) Pulse Rate:  [76-95] 76 (07/07 0651) Resp:  [12-17] 17 (07/07 0651) BP: (87-131)/(54-81) 100/62 (07/07 0651) SpO2:  [93 %-100 %] 97 % (07/07 0651) Weight:  [64.3 kg] 64.3 kg (07/06 1040) Last BM Date: 04/30/19  Intake/Output from previous day: 07/06 0701 - 07/07 0700 In: 1105.4 [P.O.:150; I.V.:905.4] Out: 267 [Drains:242; Blood:25] Intake/Output this shift: No intake/output data recorded.  Resp: clear to auscultation bilaterally Cardio: regular rate and rhythm Incision/Wound: soft upper flap hematoma that is very small, flaps viable, no blood in drain   Anti-infectives: Anti-infectives (From admission, onward)   Start     Dose/Rate Route Frequency Ordered Stop   04/30/19 0600  ceFAZolin (ANCEF) IVPB 2g/100 mL premix     2 g 200 mL/hr over 30 Minutes Intravenous On call to O.R. 04/30/19 2549 04/30/19 0804      Assessment/Plan: POD 1 right mastectomy, sn biopsy -bled some but this has decreased overnight and exam is not very concerning either -will check cbc due to hypotension but this has resolved also -I think she will be ok for dc later today and will not need any reoperation -path pending  Rolm Bookbinder 05/01/2019

## 2019-05-01 NOTE — Op Note (Signed)
Preoperative diagnosis: right mastectomy hematoma Postoperative diagnosis: Same as above Procedure:evacuation of right mastectomy hematoma Surgeon: Dr. Serita Grammes Anesthesia: General  Specimens:none Drains: 19 Leeroy Bock Blake drain times two Complications: None Estimated blood loss: 30 cc Sponge needle count was correct at completion Disposition to recovery in stable condition  Indications: This a 58 year old female who had right mastectomy with a hematoma. I discussed returning to OR for washout.   Procedure: After informed consent was obtained she was taken to the OR.   She was given antibiotics.  SCDs were in place.  She was then placed under general anesthesia without complication.  She was prepped and draped in the standard sterile surgical fashion.  A surgical timeout was then performed.  I reopened the mastectomy and there was hematoma throughout. I evacuated the hematoma. there was nothing in particular bleeding. Most of the raw surfaces were oozing. I spent some time with cautery and placed stitches where I thought might have bled. I then irrigated. There was no more evidence of bleeding. I placed arista throughout the cavity.  I then placed two new 19 [place] Blake drains and secured these with 2-0 nylon. biopatches and tegaderm was placed.  I then closed the lateral tissue to the muscle with 2-0 vicryl suture. I then closed dermis with 3-0 vicryl and 4-0 monocryl.  Glue was placed.  steristrips were placed. A dressing was placed.  She tolerated this well was extubated and transferred to recovery in stable condition.

## 2019-05-02 ENCOUNTER — Encounter (HOSPITAL_COMMUNITY): Payer: Self-pay | Admitting: General Surgery

## 2019-05-02 LAB — CBC
HCT: 25.5 % — ABNORMAL LOW (ref 36.0–46.0)
Hemoglobin: 8.4 g/dL — ABNORMAL LOW (ref 12.0–15.0)
MCH: 32.8 pg (ref 26.0–34.0)
MCHC: 32.9 g/dL (ref 30.0–36.0)
MCV: 99.6 fL (ref 80.0–100.0)
Platelets: 183 10*3/uL (ref 150–400)
RBC: 2.56 MIL/uL — ABNORMAL LOW (ref 3.87–5.11)
RDW: 20 % — ABNORMAL HIGH (ref 11.5–15.5)
WBC: 5.3 10*3/uL (ref 4.0–10.5)
nRBC: 0 % (ref 0.0–0.2)

## 2019-05-02 LAB — BPAM RBC
Blood Product Expiration Date: 202007152359
ISSUE DATE / TIME: 202007071302
Unit Type and Rh: 5100

## 2019-05-02 LAB — TYPE AND SCREEN
ABO/RH(D): O POS
Antibody Screen: NEGATIVE
Unit division: 0

## 2019-05-02 MED ORDER — METHOCARBAMOL 500 MG PO TABS: 500.0000 mg | ORAL_TABLET | Freq: Three times a day (TID) | ORAL | 1 refills | Status: DC | PRN

## 2019-05-02 NOTE — Discharge Instructions (Signed)
[facility] °[phone] ° °MASTECTOMY: POST OP INSTRUCTIONS °Take 400 mg of ibuprofen every 8 hours or 650 mg tylenol every 6 hours for next 72 hours then as needed. Use ice several times daily also. °Always review your discharge instruction sheet given to you by the facility where your surgery was performed. ° °IF YOU HAVE DISABILITY OR FAMILY LEAVE FORMS, YOU MUST BRING THEM TO THE OFFICE FOR PROCESSING.   °DO NOT GIVE THEM TO YOUR DOCTOR. °A prescription for pain medication may be given to you upon discharge.  Take your pain medication as prescribed, if needed.  If narcotic pain medicine is not needed, then you may take acetaminophen (Tylenol), naprosyn (Alleve) or ibuprofen (Advil) as needed. °1. Take your usually prescribed medications unless otherwise directed. °2. If you need a refill on your pain medication, please contact your pharmacy.  They will contact our office to request authorization.  Prescriptions will not be filled after 5pm or on week-ends. °3. You should follow a light diet the first few days after arrival home, such as soup and crackers, etc.  Resume your normal diet the day after surgery. °4. Most patients will experience some swelling and bruising on the chest and underarm.  Ice packs will help.  Swelling and bruising can take several days to resolve. Wear the binder day and night until you return to the office.  °5. It is common to experience some constipation if taking pain medication after surgery.  Increasing fluid intake and taking a stool softener (such as Colace) will usually help or prevent this problem from occurring.  A mild laxative (Milk of Magnesia or Miralax) should be taken according to package instructions if there are no bowel movements after 48 hours. °6. Unless discharge instructions indicate otherwise, leave your bandage dry and in place until your next appointment in 3-5 days.  You may take a limited sponge bath.  No tube baths or showers until the  drains are removed.  You may have steri-strips (small skin tapes) in place directly over the incision.  These strips should be left on the skin for 7-10 days. If you have glue it will come off in next couple week.  Any sutures will be removed at an office visit °7. DRAINS:  If you have drains in place, it is important to keep a list of the amount of drainage produced each day in your drains.  Before leaving the hospital, you should be instructed on drain care.  Call our office if you have any questions about your drains. I will remove your drains when they put out less than 30 cc or ml for 2 consecutive days. °8. ACTIVITIES:  You may resume regular (light) daily activities beginning the next day--such as daily self-care, walking, climbing stairs--gradually increasing activities as tolerated.  You may have sexual intercourse when it is comfortable.  Refrain from any heavy lifting or straining until approved by your doctor. °a. You may drive when you are no longer taking prescription pain medication, you can comfortably wear a seatbelt, and you can safely maneuver your car and apply brakes. °b. RETURN TO WORK:  __________________________________________________________ °9. You should see your doctor in the office for a follow-up appointment approximately 3-5 days after your surgery.  Your doctor’s nurse will typically make your follow-up appointment when she calls you with your pathology report.  Expect your pathology report 3-4business days after surgery. °10. OTHER INSTRUCTIONS: ______________________________________________________________________________________________ ____________________________________________________________________________________________ °WHEN TO CALL YOUR DR Oma Marzan: °1. Fever over 101.0 °  2. Nausea and/or vomiting °3. Extreme swelling or bruising °4. Continued bleeding from incision. °5. Increased pain, redness, or drainage from the incision. °The clinic staff is available to answer your  questions during regular business hours.  Please don’t hesitate to call and ask to speak to one of the nurses for clinical concerns.  If you have a medical emergency, go to the nearest emergency room or call 911.  A surgeon from [facility] is always on call at the hospital. °[address], [address], [place], [place]  [address] ? [address] [address], [place],    [address] °[phone] ? [phone] ? FAX [phone] °Web site: [email or web address] ° °

## 2019-05-02 NOTE — Progress Notes (Signed)
Patients JP drain site had some bloody leakage coming from it. Reinforced dressing and covered with gauze. Will continue to monitor.

## 2019-05-02 NOTE — Discharge Summary (Signed)
Physician Discharge Summary  Patient ID: Debbie Watts MRN: [id number] DOB/AGE: 1961/02/24 58 y.o.  Admit date: 04/30/2019 Discharge date: 05/02/2019  Admission Diagnoses: Breast cancer s/p chemotherapy MS HTN  Discharge Diagnoses:  Active Problems:   Breast cancer, right [facility])   Discharged Condition: good  Hospital Course: 30 yof s/p primary chemotherapy and followed by right mastectomy and sn biopsy.  She developed a hematoma pod 1 and I returned to OR to evacuate it.  She did receive a blood transfusion.  The following day she was doing well and discharged home.  Consults: none  Significant Diagnostic Studies: none  Treatments: right mastectomy, right ax sn biopsy  Discharge Exam: Blood pressure 138/74, pulse 90, temperature 98.6 F (37 C), temperature source Oral, resp. rate 20, height 5\' 2"  (1.575 m), weight 64.3 kg, SpO2 99 %. Flaps viable, no hematoma, jp as expected  Disposition:    Allergies as of 05/02/2019      Reactions   Procaine Shortness Of Breath      Medication List    TAKE these medications   aspirin-acetaminophen-caffeine 250-250-65 MG tablet Commonly known as: EXCEDRIN MIGRAINE Take 2 tablets by mouth every 6 (six) hours as needed for migraine.   betamethasone valerate lotion 0.1 % Commonly known as: VALISONE Apply 1 application topically daily as needed for irritation.   carbidopa-levodopa 10-100 MG tablet Commonly known as: SINEMET IR Take 1 tablet by mouth at bedtime.   dexamethasone 4 MG tablet Commonly known as: DECADRON TAKE 1 TABLET BY MOUTH DAILY. TAKE 1 TABLET DAY BEFORE CHEMO AND 1 TABLET DAY AFTER CHEMO WITH FOOD   diclofenac 75 MG EC tablet Commonly known as: VOLTAREN TAKE 1 TABLET BY MOUTH TWICE A DAY   Dimethyl Fumarate 240 MG Cpdr Take 1 capsule (240 mg total) by mouth 2 (two) times daily.   diphenhydrAMINE 25 MG tablet Commonly known as: BENADRYL Take 25 mg by mouth daily.   fluconazole 100 MG tablet Commonly  known as: DIFLUCAN TAKE 1 TABLET BY MOUTH EVERY DAY   fluticasone 50 MCG/ACT nasal spray Commonly known as: FLONASE Place 2 sprays into both nostrils at bedtime.   gabapentin 300 MG capsule Commonly known as: NEURONTIN Take 2 capsules (600 mg total) by mouth 2 (two) times daily.   lidocaine-prilocaine cream Commonly known as: EMLA Apply to affected area once What changed:   how much to take  how to take this  when to take this  reasons to take this  additional instructions   lisinopril 5 MG tablet Commonly known as: ZESTRIL TAKE 1 TABLET BY MOUTH EVERY DAY   loperamide 2 MG tablet Commonly known as: IMODIUM A-D Take 2-4 mg by mouth 3 (three) times daily as needed for diarrhea or loose stools.   LORazepam 0.5 MG tablet Commonly known as: Ativan Take 1 tablet (0.5 mg total) by mouth at bedtime as needed (Nausea or vomiting).   methocarbamol 500 MG tablet Commonly known as: ROBAXIN Take 1 tablet (500 mg total) by mouth every 8 (eight) hours as needed for muscle spasms.   multivitamin with minerals Tabs tablet Take 2 tablets by mouth daily.   omeprazole 20 MG tablet Commonly known as: PRILOSEC OTC Take 20 mg by mouth daily as needed (acid reflux).   ondansetron 8 MG tablet Commonly known as: ZOFRAN TAKE 1 TABLET BY MOUTH 2 TIMES DAILY AS NEEDED. BEGIN 4 DAYS AFTER CHEMOTHERAPY.   oxyCODONE 5 MG immediate release tablet Commonly known as: Oxy IR/ROXICODONE Take 1 tablet (5  mg total) by mouth every 6 (six) hours as needed for moderate pain.   oxyCODONE-acetaminophen 5-325 MG tablet Commonly known as: PERCOCET/ROXICET Take 1 tablet by mouth every 8 (eight) hours as needed for severe pain.   PARoxetine 20 MG tablet Commonly known as: PAXIL Take 1 tablet (20 mg total) by mouth daily.   prochlorperazine 10 MG tablet Commonly known as: COMPAZINE TAKE 1 TABLET (10 MG TOTAL) BY MOUTH EVERY 6 (SIX) HOURS AS NEEDED (NAUSEA OR VOMITING).   traMADol 50 MG  tablet Commonly known as: ULTRAM TAKE 1 TABLET BY MOUTH TWICE A DAY AS NEEDED What changed: reasons to take this   Ventolin HFA 108 (90 Base) MCG/ACT inhaler Generic drug: albuterol Inhale 1-2 puffs into the lungs every 6 (six) hours as needed for wheezing or shortness of breath. What changed: how much to take      Follow-up Information    Rolm Bookbinder, MD Follow up in 1 week(s).   Specialty: General Surgery Contact information: [address] [place] [address] [phone]           Signed: Rolm Bookbinder 05/02/2019, 2:44 PM

## 2019-05-03 NOTE — Assessment & Plan Note (Signed)
11/21/2018:Screening detected right breast mass upper outer quadrant, in addition to areas of calcifications which were not biopsied. By ultrasound she had 2 breast masses 12 o'clock position 2.9 cm: Grade 2 IDC with high-grade DCIS ER 100%, PR 70%, Ki-67 10%, HER-2 3+ by IHC and 11 o'clock position 1 cm, ER 90%, PR 50%, Ki-67 15%, HER-2 3+ by IHC, T2N0 stage Ib  Neoadjuvant chemotherapy with TCH Perjeta x6 cycles 12/12/2018-03/27/2019  04/30/2019: Right mastectomy: Grade 2 IDC, 3.9 cm, high-grade DCIS, margins are negative, 0/2 lymph nodes negative, ER 95%, PR 80%, HER-2 2+ equivocal, T2 N0  Pathology counseling: I discussed the final pathology report of the patient provided  a copy of this report. I discussed the margins as well as lymph node surgeries. We also discussed the final staging along with previously performed ER/PR and HER-2/neu testing.  Treatment plan: 1.  Adjuvant Kadcyla 2.  Adjuvant antiestrogen therapy with anastrozole 1 mg p.o. daily x7 years  Return to clinic every 3 weeks for Kadcyla.

## 2019-05-04 ENCOUNTER — Ambulatory Visit: Payer: Medicare HMO | Admitting: Hematology and Oncology

## 2019-05-09 NOTE — Progress Notes (Signed)
HEMATOLOGY-ONCOLOGY DOXIMITY VISIT PROGRESS NOTE  I connected with Debbie Watts on 05/10/2019 at  3:30 PM EDT by Doximity video conference and verified that I am speaking with the correct person using two identifiers.  I discussed the limitations, risks, security and privacy concerns of performing an evaluation and management service by Doximity and the availability of in person appointments.  I also discussed with the patient that there may be a patient responsible charge related to this service. The patient expressed understanding and agreed to proceed.  Patient's Location: Home Physician Location: Clinic  CHIEF COMPLIANT: Follow-up s/p mastectomy to review pathology  INTERVAL HISTORY: Debbie Watts is a 58 y.o. female with above-mentioned history of right breast cancer currently who completed 6 cycle of neoadjuvant chemotherapy with TCHP. She underwent a right mastectomy with sentinel lymph node biopsy on 04/30/19 with Dr. Donne Hazel for which pathology showed 3.9cm grade 2 invasive ductal carcinoma with high grade DCIS, HER-2 negative, ER+95%, PR+ 80%, clear margins, and 3 lymph nodes negative for carcinoma. She presents over Doximity today to review the pathology report and discuss further treatment.   Oncology History  Malignant neoplasm of upper-outer quadrant of right breast in female, estrogen receptor positive ([place])  11/21/2018 Initial Diagnosis   Screening detected right breast mass upper outer quadrant, in addition to areas of calcifications which were not biopsied.  By ultrasound she had 2 breast masses 12 o'clock position 2.9 cm: Grade 2 IDC with high-grade DCIS ER 100%, PR 70%, Ki-67 10%, HER-2 3+ by IHC and 11 o'clock position 1 cm, ER 90%, PR 50%, Ki-67 15%, HER-2 3+ by IHC, T2N0 stage Ib   11/29/2018 Cancer Staging   Staging form: Breast, AJCC 8th Edition - Clinical stage from 11/29/2018: Stage IB (cT2, cN0, cM0, G3, ER+, PR+, HER2+) - Signed by Nicholas Lose, MD on 11/29/2018    12/12/2018 -  Neo-Adjuvant Chemotherapy   Neoadjuvant chemotherapy with [facility]   01/18/2019 - 01/20/2019 Hospital Admission   Syncope   04/30/2019 Surgery   Right mastectomy: Grade 2 IDC, 3.9 cm, high-grade DCIS, margins are negative, 0/2 lymph nodes negative, ER 95%, PR 80%, HER-2 2+ equivocal, T2 N0   04/30/2019 Cancer Staging   Staging form: Breast, AJCC 8th Edition - Pathologic stage from 04/30/2019: No Stage Recommended (ypT2, pN0, cM0, G2, ER+, PR+, HER2-) - Signed by Gardenia Phlegm, NP on 05/09/2019     REVIEW OF SYSTEMS:   Constitutional: Denies fevers, chills or abnormal weight loss Eyes: Denies blurriness of vision Ears, nose, mouth, throat, and face: Denies mucositis or sore throat Respiratory: Denies cough, dyspnea or wheezes Cardiovascular: Denies palpitation, chest discomfort Gastrointestinal:  Denies nausea, heartburn or change in bowel habits Skin: Denies abnormal skin rashes Lymphatics: Denies new lymphadenopathy or easy bruising Neurological:Denies numbness, tingling or new weaknesses Behavioral/Psych: Mood is stable, no new changes  Extremities: No lower extremity edema Breast: Recent right mastectomy All other systems were reviewed with the patient and are negative.  Observations/Objective:  There were no vitals filed for this visit. There is no height or weight on file to calculate BMI.  I have reviewed the data as listed CMP Latest Ref Rng & Units 04/30/2019 04/25/2019 04/17/2019  Glucose 70 - 99 mg/dL 83 101(H) 89  BUN 6 - 20 mg/dL - 19 29(H)  Creatinine 0.44 - 1.00 mg/dL - 0.80 1.17(H)  Sodium 135 - 145 mmol/L 140 137 138  Potassium 3.5 - 5.1 mmol/L 3.3(L) 5.2(H) 3.9  Chloride 98 - 111 mmol/L -  104 103  CO2 22 - 32 mmol/L - 22 24  Calcium 8.9 - 10.3 mg/dL - 8.7(L) 8.1(L)  Total Protein 6.5 - 8.1 g/dL - 5.7(L) 6.3(L)  Total Bilirubin 0.3 - 1.2 mg/dL - 1.6(H) 0.3  Alkaline Phos 38 - 126 U/L - 69 87  AST 15 - 41 U/L - 53(H) 26  ALT 0 - 44 U/L - 40  38    Lab Results  Component Value Date   WBC 5.3 05/02/2019   HGB 8.4 (L) 05/02/2019   HCT 25.5 (L) 05/02/2019   MCV 99.6 05/02/2019   PLT 183 05/02/2019   NEUTROABS 9.0 (H) 05/01/2019      Assessment Plan:  Malignant neoplasm of upper-outer quadrant of right breast in female, estrogen receptor positive ([place]) 11/21/2018:Screening detected right breast mass upper outer quadrant, in addition to areas of calcifications which were not biopsied. By ultrasound she had 2 breast masses 12 o'clock position 2.9 cm: Grade 2 IDC with high-grade DCIS ER 100%, PR 70%, Ki-67 10%, HER-2 3+ by IHC and 11 o'clock position 1 cm, ER 90%, PR 50%, Ki-67 15%, HER-2 3+ by IHC, T2N0 stage Ib  Neoadjuvant chemotherapy with [facility] x6 cycles 12/12/2018-03/27/2019  04/30/2019: Right mastectomy: Grade 2 IDC, 3.9 cm, high-grade DCIS, margins are negative, 0/2 lymph nodes negative, ER 95%, PR 80%, HER-2 2+ equivocal, T2 N0  Pathology counseling: I discussed the final pathology report of the patient provided  a copy of this report. I discussed the margins as well as lymph node surgeries. We also discussed the final staging along with previously performed ER/PR and HER-2/neu testing.  Treatment plan: 1.  Adjuvant Kadcyla 2.  Adjuvant antiestrogen therapy with anastrozole 1 mg p.o. daily x7 years  Return to clinic every 3 weeks for Kadcyla starting 05/24/2019.   I discussed the assessment and treatment plan with the patient. The patient was provided an opportunity to ask questions and all were answered. The patient agreed with the plan and demonstrated an understanding of the instructions. The patient was advised to call back or seek an in-person evaluation if the symptoms worsen or if the condition fails to improve as anticipated.   I provided 15 minutes of face-to-face Doximity time during this encounter.    Rulon Eisenmenger, MD 05/10/2019   I, Molly Dorshimer, am acting as scribe for Nicholas Lose, MD.  I have  reviewed the above documentation for accuracy and completeness, and I agree with the above.

## 2019-05-10 ENCOUNTER — Inpatient Hospital Stay: Payer: Medicare HMO | Attending: Hematology and Oncology | Admitting: Hematology and Oncology

## 2019-05-10 DIAGNOSIS — Z170 Estrogen receptor positive status [ER+]: Secondary | ICD-10-CM | POA: Insufficient documentation

## 2019-05-10 DIAGNOSIS — Z79811 Long term (current) use of aromatase inhibitors: Secondary | ICD-10-CM | POA: Diagnosis not present

## 2019-05-10 DIAGNOSIS — Z9011 Acquired absence of right breast and nipple: Secondary | ICD-10-CM

## 2019-05-10 DIAGNOSIS — Z9221 Personal history of antineoplastic chemotherapy: Secondary | ICD-10-CM

## 2019-05-10 DIAGNOSIS — C50411 Malignant neoplasm of upper-outer quadrant of right female breast: Secondary | ICD-10-CM | POA: Insufficient documentation

## 2019-05-10 DIAGNOSIS — Z5112 Encounter for antineoplastic immunotherapy: Secondary | ICD-10-CM | POA: Insufficient documentation

## 2019-05-10 MED ORDER — OXYCODONE-ACETAMINOPHEN 5-325 MG PO TABS: 1.0000 | ORAL_TABLET | Freq: Three times a day (TID) | ORAL | 0 refills | Status: DC | PRN

## 2019-05-10 NOTE — Progress Notes (Signed)
DISCONTINUE ON PATHWAY REGIMEN - Breast     A cycle is every 21 days:     Pertuzumab      Pertuzumab      Trastuzumab-xxxx      Trastuzumab-xxxx      Carboplatin      Docetaxel   **Always confirm dose/schedule in your pharmacy ordering system**  REASON: Other Reason PRIOR TREATMENT: [id number]: Docetaxel + Carboplatin + Trastuzumab + Pertuzumab (TCHP) q21 Days x 6 Cycles TREATMENT RESPONSE: Partial Response (PR)  START ON PATHWAY REGIMEN - Breast     A cycle is every 21 days:     Ado-trastuzumab emtansine   **Always confirm dose/schedule in your pharmacy ordering system**  Patient Characteristics: Post-Neoadjuvant Therapy and Resection, HER2 Positive, ER Positive, Residual Disease, Adjuvant Targeted Therapy After Neoadjuvant Chemo/Targeted Therapy Therapeutic Status: Post-Neoadjuvant Therapy and Resection ER Status: Positive (+) HER2 Status: Positive (+) PR Status: Positive (+) Residual Invasive Disease Post-Neoadjuvant Therapy<= Yes Intent of Therapy: Curative Intent, Discussed with Patient

## 2019-05-11 ENCOUNTER — Encounter: Payer: Self-pay | Admitting: *Deleted

## 2019-05-15 ENCOUNTER — Encounter: Payer: Self-pay | Admitting: *Deleted

## 2019-05-18 NOTE — Assessment & Plan Note (Signed)
11/21/2018:Screening detected right breast mass upper outer quadrant, in addition to areas of calcifications which were not biopsied. By ultrasound she had 2 breast masses 12 o'clock position 2.9 cm: Grade 2 IDC with high-grade DCIS ER 100%, PR 70%, Ki-67 10%, HER-2 3+ by IHC and 11 o'clock position 1 cm, ER 90%, PR 50%, Ki-67 15%, HER-2 3+ by IHC, T2N0 stage Ib  Neoadjuvant chemotherapy with [facility] x6 cycles 12/12/2018-03/27/2019  04/30/2019: Right mastectomy: Grade 2 IDC, 3.9 cm, high-grade DCIS, margins are negative, 0/2 lymph nodes negative, ER 95%, PR 80%, HER-2 2+ equivocal, T2 N0  Treatment plan: 1.  Adjuvant Kadcyla 2.  Adjuvant antiestrogen therapy with anastrozole 1 mg p.o. daily x7 years ----------------------------------------------------------------------------------------------------------------------------- Current treatment: Cycle 1 Kadcyla maintenance Labs reviewed Echocardiogram will be done every 3 months  Return to clinic in 3 weeks for cycle 2 Return to clinic every 3 weeks for Kadcyla starting 05/24/2019.

## 2019-05-23 NOTE — Progress Notes (Signed)
Patient Care Team: Neva Seat, MD as PCP - General (Internal Medicine) Excell Seltzer, MD as Consulting Physician (General Surgery) Nicholas Lose, MD as Consulting Physician (Hematology and Oncology) Kyung Rudd, MD as Consulting Physician (Radiation Oncology)  DIAGNOSIS:    ICD-10-CM   1. Malignant neoplasm of upper-outer quadrant of right breast in female, estrogen receptor positive (Debbie Watts)  C50.411    Z17.0     SUMMARY OF ONCOLOGIC HISTORY: Oncology History  Malignant neoplasm of upper-outer quadrant of right breast in female, estrogen receptor positive (Debbie Watts)  11/21/2018 Initial Diagnosis   Screening detected right breast mass upper outer quadrant, in addition to areas of calcifications which were not biopsied.  By ultrasound she had 2 breast masses 12 o'clock position 2.9 cm: Grade 2 IDC with high-grade DCIS ER 100%, PR 70%, Ki-67 10%, HER-2 3+ by IHC and 11 o'clock position 1 cm, ER 90%, PR 50%, Ki-67 15%, HER-2 3+ by IHC, T2N0 stage Ib   11/29/2018 Cancer Staging   Staging form: Breast, AJCC 8th Edition - Clinical stage from 11/29/2018: Stage IB (cT2, cN0, cM0, G3, ER+, PR+, HER2+) - Signed by Nicholas Lose, MD on 11/29/2018   12/12/2018 -  Neo-Adjuvant Chemotherapy   Neoadjuvant chemotherapy with [facility]   01/18/2019 - 01/20/2019 Hospital Admission   Syncope   04/30/2019 Surgery   Right mastectomy: Grade 2 IDC, 3.9 cm, high-grade DCIS, margins are negative, 0/2 lymph nodes negative, ER 95%, PR 80%, HER-2 2+ equivocal, T2 N0   04/30/2019 Cancer Staging   Staging form: Breast, AJCC 8th Edition - Pathologic stage from 04/30/2019: No Stage Recommended (ypT2, pN0, cM0, G2, ER+, PR+, HER2-) - Signed by Gardenia Phlegm, NP on 05/09/2019   05/24/2019 -  Chemotherapy   The patient had ado-trastuzumab emtansine (KADCYLA) 240 mg in sodium chloride 0.9 % 250 mL chemo infusion, 3.6 mg/kg = 240 mg, Intravenous, Once, 0 of 8 cycles  for chemotherapy treatment.      CHIEF  COMPLIANT: Kadcyla maintenance today cycle 1  INTERVAL HISTORY: Debbie Watts is a 58 y.o. with above-mentioned history of right breast cancer who completed neoadjuvant chemotherapy with TCHP and underwent a right mastectomy. She presents to the clinic today to begin adjuvant chemotherapy with Kadcyla maintenance.  Today is her first cycle of Kadcyla.  REVIEW OF SYSTEMS:   Constitutional: Denies fevers, chills or abnormal weight loss Eyes: Denies blurriness of vision Ears, nose, mouth, throat, and face: Denies mucositis or sore throat Respiratory: Denies cough, dyspnea or wheezes Cardiovascular: Denies palpitation, chest discomfort Gastrointestinal: Denies nausea, heartburn or change in bowel habits Skin: Denies abnormal skin rashes Lymphatics: Denies new lymphadenopathy or easy bruising Neurological: Denies numbness, tingling or new weaknesses Behavioral/Psych: Mood is stable, no new changes  Extremities: No lower extremity edema Breast: denies any pain or lumps or nodules in either breasts All other systems were reviewed with the patient and are negative.  I have reviewed the past medical history, past surgical history, social history and family history with the patient and they are unchanged from previous note.  ALLERGIES:  is allergic to procaine.  MEDICATIONS:  Current Outpatient Medications  Medication Sig Dispense Refill   anastrozole (ARIMIDEX) 1 MG tablet Take 1 tablet (1 mg total) by mouth daily. 90 tablet 3   aspirin-acetaminophen-caffeine (EXCEDRIN MIGRAINE) 250-250-65 MG tablet Take 2 tablets by mouth every 6 (six) hours as needed for migraine.     betamethasone valerate lotion (VALISONE) 0.1 % Apply 1 application topically daily as needed for irritation.  carbidopa-levodopa (SINEMET IR) 10-100 MG tablet Take 1 tablet by mouth at bedtime. 90 tablet 3   diclofenac (VOLTAREN) 75 MG EC tablet TAKE 1 TABLET BY MOUTH TWICE A DAY (Patient taking differently: Take 75  mg by mouth 2 (two) times daily. ) 60 tablet 5   Dimethyl Fumarate 240 MG CPDR Take 1 capsule (240 mg total) by mouth 2 (two) times daily. 180 capsule 3   diphenhydrAMINE (BENADRYL) 25 MG tablet Take 25 mg by mouth daily.     fluconazole (DIFLUCAN) 100 MG tablet TAKE 1 TABLET BY MOUTH EVERY DAY (Patient not taking: Reported on 04/20/2019) 30 tablet 0   fluticasone (FLONASE) 50 MCG/ACT nasal spray Place 2 sprays into both nostrils at bedtime.     gabapentin (NEURONTIN) 300 MG capsule Take 2 capsules (600 mg total) by mouth 2 (two) times daily. 360 capsule 3   lisinopril (PRINIVIL,ZESTRIL) 5 MG tablet TAKE 1 TABLET BY MOUTH EVERY DAY (Patient taking differently: Take 5 mg by mouth daily. ) 90 tablet 3   methocarbamol (ROBAXIN) 500 MG tablet Take 1 tablet (500 mg total) by mouth every 8 (eight) hours as needed for muscle spasms. 20 tablet 1   Multiple Vitamin (MULTIVITAMIN WITH MINERALS) TABS tablet Take 2 tablets by mouth daily.     omeprazole (PRILOSEC OTC) 20 MG tablet Take 20 mg by mouth daily as needed (acid reflux).     oxyCODONE-acetaminophen (PERCOCET/ROXICET) 5-325 MG tablet Take 1 tablet by mouth every 8 (eight) hours as needed for severe pain. 30 tablet 0   PARoxetine (PAXIL) 20 MG tablet Take 1 tablet (20 mg total) by mouth daily. 90 tablet 3   traMADol (ULTRAM) 50 MG tablet TAKE 1 TABLET BY MOUTH TWICE A DAY AS NEEDED (Patient taking differently: Take 50 mg by mouth 2 (two) times daily as needed for moderate pain. ) 60 tablet 3   VENTOLIN HFA 108 (90 Base) MCG/ACT inhaler Inhale 1-2 puffs into the lungs every 6 (six) hours as needed for wheezing or shortness of breath. (Patient taking differently: Inhale 2 puffs into the lungs every 6 (six) hours as needed for wheezing or shortness of breath. ) 1 Inhaler 3   No current facility-administered medications for this visit.   Morning meter  PHYSICAL EXAMINATION: ECOG PERFORMANCE STATUS: 1 - Symptomatic but completely  ambulatory  Vitals:   05/24/19 0826  BP: 124/75  Pulse: 78  Resp: 17  Temp: 98.2 F (36.8 C)  SpO2: 100%   Filed Weights   05/24/19 0826  Weight: 123 lb 6.4 oz (56 kg)    GENERAL: alert, no distress and comfortable SKIN: skin color, texture, turgor are normal, no rashes or significant lesions EYES: normal, Conjunctiva are pink and non-injected, sclera clear OROPHARYNX: no exudate, no erythema and lips, buccal mucosa, and tongue normal  NECK: supple, thyroid normal size, non-tender, without nodularity LYMPH: no palpable lymphadenopathy in the cervical, axillary or inguinal LUNGS: clear to auscultation and percussion with normal breathing effort HEART: regular rate & rhythm and no murmurs and no lower extremity edema ABDOMEN: abdomen soft, non-tender and normal bowel sounds MUSCULOSKELETAL: no cyanosis of digits and no clubbing  NEURO: alert & oriented x 3 with fluent speech, no focal motor/sensory deficits EXTREMITIES: No lower extremity edema  LABORATORY DATA:  I have reviewed the data as listed CMP Latest Ref Rng & Units 04/30/2019 04/25/2019 04/17/2019  Glucose 70 - 99 mg/dL 83 101(H) 89  BUN 6 - 20 mg/dL - 19 29(H)  Creatinine 0.44 -  1.00 mg/dL - 0.80 1.17(H)  Sodium 135 - 145 mmol/L 140 137 138  Potassium 3.5 - 5.1 mmol/L 3.3(L) 5.2(H) 3.9  Chloride 98 - 111 mmol/L - 104 103  CO2 22 - 32 mmol/L - 22 24  Calcium 8.9 - 10.3 mg/dL - 8.7(L) 8.1(L)  Total Protein 6.5 - 8.1 g/dL - 5.7(L) 6.3(L)  Total Bilirubin 0.3 - 1.2 mg/dL - 1.6(H) 0.3  Alkaline Phos 38 - 126 U/L - 69 87  AST 15 - 41 U/L - 53(H) 26  ALT 0 - 44 U/L - 40 38    Lab Results  Component Value Date   WBC 3.3 (L) 05/24/2019   HGB 11.1 (L) 05/24/2019   HCT 34.7 (L) 05/24/2019   MCV 103.9 (H) 05/24/2019   PLT 271 05/24/2019   NEUTROABS 2.2 05/24/2019    ASSESSMENT & PLAN:  Malignant neoplasm of upper-outer quadrant of right breast in female, estrogen receptor positive ([place]) 11/21/2018:Screening detected  right breast mass upper outer quadrant, in addition to areas of calcifications which were not biopsied. By ultrasound she had 2 breast masses 12 o'clock position 2.9 cm: Grade 2 IDC with high-grade DCIS ER 100%, PR 70%, Ki-67 10%, HER-2 3+ by IHC and 11 o'clock position 1 cm, ER 90%, PR 50%, Ki-67 15%, HER-2 3+ by IHC, T2N0 stage Ib  Neoadjuvant chemotherapy with [facility] x6 cycles 12/12/2018-03/27/2019  04/30/2019: Right mastectomy: Grade 2 IDC, 3.9 cm, high-grade DCIS, margins are negative, 0/2 lymph nodes negative, ER 95%, PR 80%, HER-2 2+ equivocal, T2 N0  Treatment plan: 1.  Adjuvant Kadcyla 2.  Adjuvant antiestrogen therapy with anastrozole 1 mg p.o. daily x7 years ----------------------------------------------------------------------------------------------------------------------------- Current treatment: Cycle 1 Kadcyla maintenance Labs reviewed Echocardiogram will be done every 3 months  Return to clinic in 3 weeks for cycle 2 Return to clinic every 3 weeks for Kadcyla starting 05/24/2019.  We discussed the risks and benefits of anti-estrogen therapy with aromatase inhibitors. These include but not limited to insomnia, hot flashes, mood changes, vaginal dryness, bone density loss, and weight gain. We strongly believe that the benefits far outweigh the risks. Patient understands these risks and consented to starting treatment. Planned treatment duration is 7 years.  I sent a prescription for anastrozole to her pharmacy today.    No orders of the defined types were placed in this encounter.  The patient has a good understanding of the overall plan. she agrees with it. she will call with any problems that may develop before the next visit here.  Nicholas Lose, MD 05/24/2019  Julious Oka Dorshimer am acting as scribe for Dr. Nicholas Lose.  I have reviewed the above documentation for accuracy and completeness, and I agree with the above.

## 2019-05-24 ENCOUNTER — Inpatient Hospital Stay: Payer: Medicare HMO

## 2019-05-24 ENCOUNTER — Encounter: Payer: Self-pay | Admitting: *Deleted

## 2019-05-24 ENCOUNTER — Other Ambulatory Visit: Payer: Self-pay

## 2019-05-24 ENCOUNTER — Inpatient Hospital Stay (HOSPITAL_BASED_OUTPATIENT_CLINIC_OR_DEPARTMENT_OTHER): Payer: Medicare HMO | Admitting: Hematology and Oncology

## 2019-05-24 DIAGNOSIS — C50411 Malignant neoplasm of upper-outer quadrant of right female breast: Secondary | ICD-10-CM

## 2019-05-24 DIAGNOSIS — Z9011 Acquired absence of right breast and nipple: Secondary | ICD-10-CM | POA: Diagnosis not present

## 2019-05-24 DIAGNOSIS — Z170 Estrogen receptor positive status [ER+]: Secondary | ICD-10-CM | POA: Diagnosis not present

## 2019-05-24 DIAGNOSIS — Z5112 Encounter for antineoplastic immunotherapy: Secondary | ICD-10-CM | POA: Diagnosis not present

## 2019-05-24 DIAGNOSIS — Z95828 Presence of other vascular implants and grafts: Secondary | ICD-10-CM

## 2019-05-24 LAB — CBC WITH DIFFERENTIAL (CANCER CENTER ONLY)
Abs Immature Granulocytes: 0.01 10*3/uL (ref 0.00–0.07)
Basophils Absolute: 0 10*3/uL (ref 0.0–0.1)
Basophils Relative: 1 %
Eosinophils Absolute: 0.1 10*3/uL (ref 0.0–0.5)
Eosinophils Relative: 3 %
HCT: 34.7 % — ABNORMAL LOW (ref 36.0–46.0)
Hemoglobin: 11.1 g/dL — ABNORMAL LOW (ref 12.0–15.0)
Immature Granulocytes: 0 %
Lymphocytes Relative: 14 %
Lymphs Abs: 0.5 10*3/uL — ABNORMAL LOW (ref 0.7–4.0)
MCH: 33.2 pg (ref 26.0–34.0)
MCHC: 32 g/dL (ref 30.0–36.0)
MCV: 103.9 fL — ABNORMAL HIGH (ref 80.0–100.0)
Monocytes Absolute: 0.5 10*3/uL (ref 0.1–1.0)
Monocytes Relative: 16 %
Neutro Abs: 2.2 10*3/uL (ref 1.7–7.7)
Neutrophils Relative %: 66 %
Platelet Count: 271 10*3/uL (ref 150–400)
RBC: 3.34 MIL/uL — ABNORMAL LOW (ref 3.87–5.11)
RDW: 15.9 % — ABNORMAL HIGH (ref 11.5–15.5)
WBC Count: 3.3 10*3/uL — ABNORMAL LOW (ref 4.0–10.5)
nRBC: 0 % (ref 0.0–0.2)

## 2019-05-24 LAB — CMP (CANCER CENTER ONLY)
ALT: 24 U/L (ref 0–44)
AST: 19 U/L (ref 15–41)
Albumin: 3.7 g/dL (ref 3.5–5.0)
Alkaline Phosphatase: 93 U/L (ref 38–126)
Anion gap: 10 (ref 5–15)
BUN: 19 mg/dL (ref 6–20)
CO2: 26 mmol/L (ref 22–32)
Calcium: 8.8 mg/dL — ABNORMAL LOW (ref 8.9–10.3)
Chloride: 106 mmol/L (ref 98–111)
Creatinine: 0.8 mg/dL (ref 0.44–1.00)
GFR, Est AFR Am: >60 mL/min (ref >60)
GFR, Estimated: >60 mL/min (ref >60)
Glucose, Bld: 77 mg/dL (ref 70–99)
Potassium: 4 mmol/L (ref 3.5–5.1)
Sodium: 142 mmol/L (ref 135–145)
Total Bilirubin: 0.2 mg/dL — ABNORMAL LOW (ref 0.3–1.2)
Total Protein: 6.3 g/dL — ABNORMAL LOW (ref 6.5–8.1)

## 2019-05-24 MED ORDER — SODIUM CHLORIDE 0.9% FLUSH
10.0000 mL | Freq: Once | INTRAVENOUS | Status: AC
  Administered 2019-05-24: 10 mL
  Filled 2019-05-24: qty 10

## 2019-05-24 MED ORDER — ACETAMINOPHEN 325 MG PO TABS
ORAL_TABLET | ORAL | Status: AC
  Filled 2019-05-24: qty 2

## 2019-05-24 MED ORDER — SODIUM CHLORIDE 0.9% FLUSH
10.0000 mL | INTRAVENOUS | Status: DC | PRN
  Administered 2019-05-24: 10 mL
  Filled 2019-05-24: qty 10

## 2019-05-24 MED ORDER — SODIUM CHLORIDE 0.9 % IV SOLN
Freq: Once | INTRAVENOUS | Status: AC
  Administered 2019-05-24: 10:00:00 via INTRAVENOUS
  Filled 2019-05-24: qty 250

## 2019-05-24 MED ORDER — HEPARIN SOD (PORK) LOCK FLUSH 100 UNIT/ML IV SOLN
500.0000 [IU] | Freq: Once | INTRAVENOUS | Status: AC | PRN
  Administered 2019-05-24: 500 [IU]
  Filled 2019-05-24: qty 5

## 2019-05-24 MED ORDER — ACETAMINOPHEN 325 MG PO TABS
650.0000 mg | ORAL_TABLET | Freq: Once | ORAL | Status: AC
  Administered 2019-05-24: 650 mg via ORAL

## 2019-05-24 MED ORDER — ANASTROZOLE 1 MG PO TABS: 1.0000 mg | ORAL_TABLET | Freq: Every day | ORAL | 3 refills | Status: DC

## 2019-05-24 MED ORDER — SODIUM CHLORIDE 0.9 % IV SOLN
200.0000 mg | Freq: Once | INTRAVENOUS | Status: AC
  Administered 2019-05-24: 11:00:00 200 mg via INTRAVENOUS
  Filled 2019-05-24: qty 10

## 2019-05-24 MED ORDER — DIPHENHYDRAMINE HCL 25 MG PO CAPS
50.0000 mg | ORAL_CAPSULE | Freq: Once | ORAL | Status: AC
  Administered 2019-05-24: 10:00:00 50 mg via ORAL

## 2019-05-24 MED ORDER — DIPHENHYDRAMINE HCL 25 MG PO CAPS
ORAL_CAPSULE | ORAL | Status: AC
  Filled 2019-05-24: qty 2

## 2019-05-24 NOTE — Patient Instructions (Addendum)
[facility] °Discharge Instructions for Patients Receiving Chemotherapy ° °Today you received the following chemotherapy agents Kadcyla ° °To help prevent nausea and vomiting after your treatment, we encourage you to take your nausea medication as directed ° °  °If you develop nausea and vomiting that is not controlled by your nausea medication, call the clinic.  ° °BELOW ARE SYMPTOMS THAT SHOULD BE REPORTED IMMEDIATELY: °· *FEVER GREATER THAN 100.5 F °· *CHILLS WITH OR WITHOUT FEVER °· NAUSEA AND VOMITING THAT IS NOT CONTROLLED WITH YOUR NAUSEA MEDICATION °· *UNUSUAL SHORTNESS OF BREATH °· *UNUSUAL BRUISING OR BLEEDING °· TENDERNESS IN MOUTH AND THROAT WITH OR WITHOUT PRESENCE OF ULCERS °· *URINARY PROBLEMS °· *BOWEL PROBLEMS °· UNUSUAL RASH °Items with * indicate a potential emergency and should be followed up as soon as possible. ° °Feel free to call the clinic should you have any questions or concerns. The clinic phone number is [phone]. ° °Please show the CHEMO ALERT CARD at check-in to the Emergency Department and triage nurse. ° ° °Ado-Trastuzumab Emtansine for injection °What is this medicine? °ADO-TRASTUZUMAB EMTANSINE (ADD oh traz TOO zuh mab em TAN zine) is a monoclonal antibody combined with chemotherapy. It is used to treat breast cancer. °This medicine may be used for other purposes; ask your health care provider or pharmacist if you have questions. °COMMON BRAND NAME(S): Kadcyla °What should I tell my health care provider before I take this medicine? °They need to know if you have any of these conditions: °· heart disease °· heart failure °· infection (especially a virus infection such as chickenpox, cold sores, or herpes) °· liver disease °· lung or breathing disease, like asthma °· tingling of the fingers or toes, or other nerve disorder °· an unusual or allergic reaction to ado-trastuzumab emtansine, other medications, foods, dyes, or preservatives °· pregnant or trying to get  pregnant °· breast-feeding °How should I use this medicine? °This medicine is for infusion into a vein. It is given by a health care professional in a hospital or clinic setting. °Talk to your pediatrician regarding the use of this medicine in children. Special care may be needed. °Overdosage: If you think you have taken too much of this medicine contact a poison control center or emergency room at once. °NOTE: This medicine is only for you. Do not share this medicine with others. °What if I miss a dose? °It is important not to miss your dose. Call your doctor or health care professional if you are unable to keep an appointment. °What may interact with this medicine? °This medicine may also interact with the following medications: °· atazanavir °· boceprevir °· clarithromycin °· delavirdine °· indinavir °· dalfopristin; quinupristin °· isoniazid, INH °· itraconazole °· ketoconazole °· nefazodone °· nelfinavir °· ritonavir °· telaprevir °· telithromycin °· tipranavir °· voriconazole °This list may not describe all possible interactions. Give your health care provider a list of all the medicines, herbs, non-prescription drugs, or dietary supplements you use. Also tell them if you smoke, drink alcohol, or use illegal drugs. Some items may interact with your medicine. °What should I watch for while using this medicine? °Visit your doctor for checks on your progress. This drug may make you feel generally unwell. This is not uncommon, as chemotherapy can affect healthy cells as well as cancer cells. Report any side effects. Continue your course of treatment even though you feel ill unless your doctor tells you to stop. °You may need blood work done while you are taking   this medicine. °Call your doctor or health care professional for advice if you get a fever, chills or sore throat, or other symptoms of a cold or flu. Do not treat yourself. This drug decreases your body's ability to fight infections. Try to avoid being  around people who are sick. °Be careful brushing and flossing your teeth or using a toothpick because you may get an infection or bleed more easily. If you have any dental work done, tell your dentist you are receiving this medicine. °Avoid taking products that contain aspirin, acetaminophen, ibuprofen, naproxen, or ketoprofen unless instructed by your doctor. These medicines may hide a fever. °Do not become pregnant while taking this medicine or for 7 months after stopping it, men with female partners should use contraception during treatment and for 4 months after the last dose. Women should inform their doctor if they wish to become pregnant or think they might be pregnant. There is a potential for serious side effects to an unborn child. Do not breast-feed an infant while taking this medicine or for 7 months after the last dose. °Men who have a partner who is pregnant or who is capable of becoming pregnant should use a condom during sexual activity while taking this medicine and for 4 months after stopping it. Men should inform their doctors if they wish to father a child. This medicine may lower sperm counts. Talk to your health care professional or pharmacist for more information. °What side effects may I notice from receiving this medicine? °Side effects that you should report to your doctor or health care professional as soon as possible: °· allergic reactions like skin rash, itching or hives, swelling of the face, lips, or tongue °· breathing problems °· chest pain or palpitations °· fever or chills, sore throat °· general ill feeling or flu-like symptoms °· light-colored stools °· nausea, vomiting °· pain, tingling, numbness in the hands or feet °· signs and symptoms of bleeding such as bloody or black, tarry stools; red or dark-brown urine; spitting up blood or brown material that looks like coffee grounds; red spots on the skin; unusual bruising or bleeding from the eye, gums, or nose °· swelling of the  legs or ankles °· yellowing of the eyes or skin °Side effects that usually do not require medical attention (report to your doctor or health care professional if they continue or are bothersome): °· changes in taste °· constipation °· dizziness °· headache °· joint pain °· muscle pain °· trouble sleeping °· unusually weak or tired °This list may not describe all possible side effects. Call your doctor for medical advice about side effects. You may report side effects to FDA at 1-800-FDA-1088. °Where should I keep my medicine? °This drug is given in a hospital or clinic and will not be stored at home. °NOTE: This sheet is a summary. It may not cover all possible information. If you have questions about this medicine, talk to your doctor, pharmacist, or health care provider. °© 2020 [employer]vier/Gold Standard (2018-03-10 10:03:15) ° °

## 2019-05-24 NOTE — Progress Notes (Signed)
Wt entered as 141lbs on 04/30/19 and 123lbs for 05/24/19. MD feels she is closer to 123lbs than 141lbs. Will decrease Kadcyla dose to 200mg .  B. Corey Skains, PharmD, BCPS, BCOP

## 2019-05-24 NOTE — Patient Instructions (Signed)

## 2019-05-25 ENCOUNTER — Other Ambulatory Visit: Payer: Self-pay | Admitting: Hematology and Oncology

## 2019-05-25 ENCOUNTER — Telehealth: Payer: Self-pay | Admitting: *Deleted

## 2019-05-25 NOTE — Telephone Encounter (Signed)
Attempt x1 to contact pt to follow up on first dose of treatment yesterday.  No answer, LVM to return call.

## 2019-05-28 ENCOUNTER — Telehealth: Payer: Self-pay

## 2019-05-28 NOTE — Telephone Encounter (Signed)
RN placed call to patient to follow up after first time Kadcyla.  Pt denies any fever, diarrhea, or nausea.  Pt reports, "I feel really good actually."    RN educated on s/s to monitor and notify clinic of.  Pt verbalized understanding.  No further needs at this time.

## 2019-06-04 ENCOUNTER — Other Ambulatory Visit: Payer: Self-pay

## 2019-06-04 ENCOUNTER — Encounter: Payer: Self-pay | Admitting: Rehabilitation

## 2019-06-04 ENCOUNTER — Ambulatory Visit: Payer: Medicare HMO | Attending: General Surgery | Admitting: Rehabilitation

## 2019-06-04 DIAGNOSIS — C50411 Malignant neoplasm of upper-outer quadrant of right female breast: Secondary | ICD-10-CM | POA: Insufficient documentation

## 2019-06-04 DIAGNOSIS — M25611 Stiffness of right shoulder, not elsewhere classified: Secondary | ICD-10-CM | POA: Insufficient documentation

## 2019-06-04 DIAGNOSIS — Z170 Estrogen receptor positive status [ER+]: Secondary | ICD-10-CM | POA: Diagnosis not present

## 2019-06-04 DIAGNOSIS — R293 Abnormal posture: Secondary | ICD-10-CM | POA: Diagnosis not present

## 2019-06-04 NOTE — Patient Instructions (Signed)
Access Code: [email or web address]  URL: [email or web address]  Date: 06/04/2019  Prepared by: Shan Levans   Exercises  Supine Shoulder Flexion Extension AAROM with Dowel - 10 reps - 5 seconds hold - 1-2x daily - 7x weekly  Doorway Pec Stretch at 90 Degrees Abduction - 10 reps - 1-3 sets - 20-30 seconds hold - 1x daily - 7x weekly  TL Sidebending Stretch - Single Arm Overhead - 5 reps - 1 sets - 10 seconds hold - 1x daily - 7x weekly  Standing shoulder flexion wall slides - 10 reps - 1-3 sets - 20-30 seconds hold - 1x daily - 7x weekly  Supine Chest Stretch with Elbows Bent - 3 reps - 1 sets - 30-60seconds hold - 1x daily - 7x weekly    Scar Massage  Scar massage is done to improve the mobility of scar, decrease scar tissue from building up, reduce adhesions, and prevent Keloids from forming. Start scar massage after scabs have fallen off by themselves and no open areas. The first few weeks after surgery, it is normal for a scar to appear pink or red and slightly raised. Scars can itch or have areas of numbness. Some scars may be sensitive.   Direct Scar massage: after scar is healed, no opening, no scab 1.  Place pads of two fingers together directly on the scar starting at one end of the scar. Move the fingers up and down across the scar holding 5 seconds one direction.  Then go opposite direction hold 5 seconds.  2. Move over to the next section of the scar and repeat.  Work your way along the entire length of the scar.   3. Next make diagonal movements along the scar holding 5 seconds at one direction. 4. Next movement is side to side. 5. Do not rub fingers over the scar.  Instead keep firm pressure and move scar over the tissue it is on top   Scar Lift and Roll 12 weeks after surgery. 1. Pinch a small amount of the scar between your first two fingers and thumb.  2. Roll the scar between your fingers for 5 to 15 seconds. 3. Move along the scar and repeat until you have massaged the  entire length of scar.   Stop the massage and call your doctor if you notice: 1. Increased redness 2. Bleeding from scar 3. Seepage coming from the scar 4. Scar is warmer and has increased pain

## 2019-06-04 NOTE — Therapy (Signed)
[place], [place], [address] Phone: [phone]   Fax:  [phone]  Physical Therapy Evaluation  Patient Details  Name: Debbie Watts MRN: [id number] Date of Birth: 05/08/1961 Referring Provider (PT): Dr. Donne Hazel   Encounter Date: 06/04/2019  PT End of Session - 06/04/19 1548    Visit Number  1    Number of Visits  6    Date for PT Re-Evaluation  07/30/19    Authorization Type  [employer] needs approval    PT Start Time  1500    PT Stop Time  1545    PT Time Calculation (min)  45 min    Activity Tolerance  Patient tolerated treatment well    Behavior During Therapy  [facility] for tasks assessed/performed       Past Medical History:  Diagnosis Date  . Arthritis   . Asthma   . Cancer ([place])    chemo for breast ca  x11mo  . Depression   . Deviated nasal septum   . Hearing loss 06/11/2014   Bilateral  . Herniated cervical disc   . Hypertension   . Migraine   . Miscarriage    2  . MS (multiple sclerosis) ([place]     Past Surgical History:  Procedure Laterality Date  . HEMATOMA EVACUATION Right 05/01/2019   Procedure: EVACUATION HEMATOMA;  Surgeon: WRolm Bookbinder MD;  Location[facility]  Service: General;  Laterality: Right;  . MASTECTOMY W/ SENTINEL NODE BIOPSY Right 04/30/2019   Procedure: RIGHT MASTECTOMY WITH  RIGHT AXILLARY SENTINEL LYMPH NODE BIOPSY INJECT BLUE DYE;  Surgeon: WRolm Bookbinder MD;  [facility]  Service: General;  Laterality: Right;  . NASAL SEPTUM SURGERY    . SIMPLE MASTECTOMY WITH AXILLARY SENTINEL NODE BIOPSY Right 04/30/2019  . spinal injections      There were no vitals filed for this visit.   Subjective Assessment - 06/04/19 1501    Subjective  I have been doing pretty well. I take care of my boyfriend.    Pertinent History  Rt mastectomy 04/30/19 with SLNB due to grade 2 IDC, high grade DCIS 0/2 lymph nodes. ER/PR positive, HER2 equivocal and then Rt breast  hematoma evacuation on 05/01/19 by Dr. WDonne Hazel  Neo adjuvant chemotherapy TCH Perjeta started 12/12/18 to current. Future treatment to include Kadcyla and anastrozole x 7 years. Other history includes MS, HTN, OA, asthma, bilateral hearing loss    Currently in Pain?  Yes    Pain Score  6     Pain Location  Chest    Pain Orientation  Right    Pain Descriptors / Indicators  Aching;Shooting    Pain Type  Neuropathic pain;Surgical pain    Pain Onset  More than a month ago    Pain Frequency  Intermittent    Aggravating Factors   just random at rest    Effect of Pain on Daily Activities  reports none         [facility]      Assessment   Medical Diagnosis  Rt breast cancer    Referring Provider (PT)  Dr. WDonne Hazel   Onset Date/Surgical Date  04/30/19    Hand Dominance  Right      Precautions   Precaution Comments  lymphedema, HOH, MS      Restrictions   Weight Bearing Restrictions  No      Balance Screen   Has the patient fallen in  the past 6 months  No    Has the patient had a decrease in activity level because of a fear of falling?   No    Is the patient reluctant to leave their home because of a fear of falling?   No      Home Film/video editor residence    Living Arrangements  Spouse/significant other    Available Help at Discharge  --   boyfriend     Prior Function   Level of Independence  Independent      Cognition   Overall Cognitive Status  Within Functional Limits for tasks assessed      Observation/Other Assessments   Observations  well healed incision    Skin Integrity  no edema present       Sensation   Additional Comments  numbness in the armpit "I always have numbness in the 4th and 5th fingers of the Rt hand x 20 years"      Coordination   Gross Motor Movements are Fluid and Coordinated  Yes      Posture/Postural Control   Posture/Postural Control  Postural limitations    Postural Limitations  Rounded  Shoulders;Forward head      ROM / Strength   AROM / PROM / Strength  AROM;PROM      AROM   Overall AROM Comments  just pulling     AROM Assessment Site  Shoulder    Right/Left Shoulder  Right;Left    Right Shoulder Extension  75 Degrees    Right Shoulder Flexion  155 Degrees    Right Shoulder ABduction  162 Degrees    Right Shoulder Internal Rotation  75 Degrees    Right Shoulder External Rotation  90 Degrees    Right Shoulder Horizontal ABduction  20 Degrees    Left Shoulder Extension  70 Degrees    Left Shoulder Flexion  137 Degrees    Left Shoulder ABduction  170 Degrees    Left Shoulder Internal Rotation  85 Degrees    Left Shoulder External Rotation  80 Degrees    Left Shoulder Horizontal ABduction  40 Degrees      PROM   PROM Assessment Site  Shoulder    Right/Left Shoulder  Right    Right Shoulder Flexion  160 Degrees    Right Shoulder ABduction  165 Degrees    Right Shoulder External Rotation  85 Degrees        LYMPHEDEMA/ONCOLOGY QUESTIONNAIRE - 06/04/19 1513      Type   Cancer Type  Right breast      Surgeries   Mastectomy Date  04/30/19    Sentinel Lymph Node Biopsy Date  04/30/19    Number Lymph Nodes Removed  2   negative     Treatment   Active Chemotherapy Treatment  Yes    Active Radiation Treatment  No    Past Radiation Treatment  No    Current Hormone Treatment  Yes    Drug Name  anastrozole      What other symptoms do you have   Are you Having Heaviness or Tightness  Yes    Are you having Pain  Yes      Lymphedema Assessments   Lymphedema Assessments  Upper extremities      Right Upper Extremity Lymphedema   10 cm Proximal to Olecranon Process  26.6 cm    Olecranon Process  24 cm    10 cm Proximal to Ulnar  Styloid Process  20.6 cm    Just Proximal to Ulnar Styloid Process  15.2 cm    Across Hand at [employer]  19.6 cm    At [place] of 2nd Digit  6.3 cm      Left Upper Extremity Lymphedema   10 cm Proximal to Olecranon Process  26.6  cm    Olecranon Process  24 cm    10 cm Proximal to Ulnar Styloid Process  19.6 cm    Just Proximal to Ulnar Styloid Process  15.2 cm    Across Hand at [employer]  19 cm    At [place] of 2nd Digit  6.5 cm             Objective measurements completed on examination: See above findings.      [facility] Adult PT Treatment/Exercise - 06/04/19 0001      Exercises   Exercises  Other Exercises    Other Exercises   performed each HEP x 5, supine dowel, doorway stretch, lateral reach, and wall flexion             PT Education - 06/04/19 1548    Education Details  POC, lymphedema and risk reduction, HEP    Person(s) Educated  Patient    Methods  Explanation;Demonstration;Tactile cues;Verbal cues;Handout    Comprehension  Verbalized understanding;Returned demonstration;Verbal cues required;Tactile cues required          PT Long Term Goals - 06/04/19 1552      PT LONG TERM GOAL #1   Title  pt will improve Rt shoulder AROM to within 5 degrees of the opposite UE to demonstrate return to baseline ROM    Time  8    Period  Weeks    Status  New      PT LONG TERM GOAL #2   Title  Pt will demonstrate 4+/5 MMT on the Rt UE or equal between sides    Time  8    Period  Weeks    Status  New      PT LONG TERM GOAL #3   Title  Pt will be educated on lymphedema and risk reduction strategies    Time  8    Period  Weeks    Status  New      PT LONG TERM GOAL #4   Title  pt will be ind with final HEP    Time  8    Period  Weeks    Status  New             Plan - 06/04/19 1549    Clinical Impression Statement  Pt presents almost 5 weeks after Rt mastectomy and SLNB doing very well.  Current limitations include decreased shoulder AROM with pull in the arm, postural limitations, and the need for lymphedema and final HEP education.  Due to finances pt requested as few appointments as possible.    Personal Factors and Comorbidities  Finances;Comorbidity 1    Comorbidities   MS    Examination-Activity Limitations  Lift    Examination-Participation Restrictions  --   reports none   Stability/Clinical Decision Making  Stable/Uncomplicated    Clinical Decision Making  Low    Rehab Potential  Excellent    PT Frequency  1x / week    PT Duration  8 weeks    PT Treatment/Interventions  ADLs/Self Care Home Management;Patient/family education;Manual techniques;Manual lymph drainage;Therapeutic exercise;Passive range of motion    PT Next Visit  Plan  recheck ROM, any new stretches or add supine scap/strength ABC    PT Home Exercise Plan  Access Code: [email or web address]    Consulted and Agree with Plan of Care  Patient       Patient will benefit from skilled therapeutic intervention in order to improve the following deficits and impairments:  Decreased skin integrity, Pain, Decreased scar mobility, Decreased range of motion, Impaired UE functional use  Visit Diagnosis: 1. Abnormal posture   2. Stiffness of right shoulder, not elsewhere classified   3. Malignant neoplasm of upper-outer quadrant of right breast in female, estrogen receptor positive [facility])        Problem List Patient Active Problem List   Diagnosis Date Noted  . Breast cancer, right ([place]) 04/30/2019  . Port-A-Cath in place 02/13/2019  . Syncope 01/19/2019  . Nausea vomiting and diarrhea 01/19/2019  . Leukocytosis 01/19/2019  . Malignant neoplasm of upper-outer quadrant of right breast in female, estrogen receptor positive ([place]) 11/27/2018  . Neurogenic claudication due to lumbar spinal stenosis 09/19/2018  . Urinary tract infection without hematuria 08/10/2018  . Chronic left-sided thoracic back pain 07/20/2018  . Tendon nodule 04/21/2018  . Solar lentigo 04/21/2018  . Preventative health care 04/20/2018  . Acute cystitis without hematuria 03/16/2018  . Gait disturbance 01/12/2018  . Polyuria 12/08/2017  . Easy bruising 08/31/2017  . Chronic obstructive pulmonary disease ([place]) 11/30/2016  .  Essential hypertension 01/26/2016  . Migraine headache 06/23/2014  . Multiple sclerosis ([place]) 06/23/2014  . Paresthesias 06/23/2014  . Restless leg syndrome 06/23/2014  . Hearing loss, bilateral 06/11/2014  . Major depression, recurrent, chronic ([place]) 06/11/2014  . Cervical herniated disc 06/11/2014  . Cigarette nicotine dependence without complication 34/28/7681  . Lumbar herniated disc 06/11/2014  . Perimenopausal symptoms 06/11/2014    Stark Bray 06/04/2019, 3:55 PM  [place], [place], [address] Phone: [phone]   Fax:  [phone]  Name: Kritika Stukes MRN: [id number] Date of Birth: [birth year]

## 2019-06-05 ENCOUNTER — Other Ambulatory Visit: Payer: Self-pay | Admitting: Internal Medicine

## 2019-06-05 DIAGNOSIS — I10 Essential (primary) hypertension: Secondary | ICD-10-CM

## 2019-06-05 DIAGNOSIS — F339 Major depressive disorder, recurrent, unspecified: Secondary | ICD-10-CM

## 2019-06-05 NOTE — Telephone Encounter (Signed)
Refills approved.

## 2019-06-07 NOTE — Assessment & Plan Note (Signed)
11/21/2018:Screening detected right breast mass upper outer quadrant, in addition to areas of calcifications which were not biopsied. By ultrasound she had 2 breast masses 12 o'clock position 2.9 cm: Grade 2 IDC with high-grade DCIS ER 100%, PR 70%, Ki-67 10%, HER-2 3+ by IHC and 11 o'clock position 1 cm, ER 90%, PR 50%, Ki-67 15%, HER-2 3+ by IHC, T2N0 stage Ib  Neoadjuvant chemotherapy with TCH Perjeta x6 cycles 12/12/2018-03/27/2019  04/30/2019:Right mastectomy: Grade 2 IDC, 3.9 cm, high-grade DCIS, margins are negative, 0/2 lymph nodes negative, ER 95%, PR 80%, HER-2 2+ equivocal, T2 N0  Treatment plan: 1.Adjuvant Kadcyla 2.Adjuvant antiestrogen therapy with anastrozole 1 mg p.o. daily x7 years ----------------------------------------------------------------------------------------------------------------------------- Current treatment: Cycle 1 Kadcyla maintenance started 05/24/2019 Labs reviewed Echocardiogram will be done every 3 months  Return to clinic in 3 weeks for Kadcyla treatment

## 2019-06-13 NOTE — Progress Notes (Signed)
Patient Care Team: Neva Seat, MD as PCP - General (Internal Medicine) Excell Seltzer, MD as Consulting Physician (General Surgery) Nicholas Lose, MD as Consulting Physician (Hematology and Oncology) Kyung Rudd, MD as Consulting Physician (Radiation Oncology)  DIAGNOSIS:    ICD-10-CM   1. Malignant neoplasm of upper-outer quadrant of right breast in female, estrogen receptor positive ([place])  C50.411    Z17.0     SUMMARY OF ONCOLOGIC HISTORY: Oncology History  Malignant neoplasm of upper-outer quadrant of right breast in female, estrogen receptor positive ([place])  11/21/2018 Initial Diagnosis   Screening detected right breast mass upper outer quadrant, in addition to areas of calcifications which were not biopsied.  By ultrasound she had 2 breast masses 12 o'clock position 2.9 cm: Grade 2 IDC with high-grade DCIS ER 100%, PR 70%, Ki-67 10%, HER-2 3+ by IHC and 11 o'clock position 1 cm, ER 90%, PR 50%, Ki-67 15%, HER-2 3+ by IHC, T2N0 stage Ib   11/29/2018 Cancer Staging   Staging form: Breast, AJCC 8th Edition - Clinical stage from 11/29/2018: Stage IB (cT2, cN0, cM0, G3, ER+, PR+, HER2+) - Signed by Nicholas Lose, MD on 11/29/2018   12/12/2018 -  Neo-Adjuvant Chemotherapy   Neoadjuvant chemotherapy with [facility]   01/18/2019 - 01/20/2019 Hospital Admission   Syncope   04/30/2019 Surgery   Right mastectomy: Grade 2 IDC, 3.9 cm, high-grade DCIS, margins are negative, 0/2 lymph nodes negative, ER 95%, PR 80%, HER-2 2+ equivocal, T2 N0   04/30/2019 Cancer Staging   Staging form: Breast, AJCC 8th Edition - Pathologic stage from 04/30/2019: No Stage Recommended (ypT2, pN0, cM0, G2, ER+, PR+, HER2-) - Signed by Gardenia Phlegm, NP on 05/09/2019   05/24/2019 -  Chemotherapy   The patient had ado-trastuzumab emtansine (KADCYLA) 200 mg in sodium chloride 0.9 % 250 mL chemo infusion, 240 mg, Intravenous, Once, 1 of 8 cycles Administration: 200 mg (05/24/2019)  for chemotherapy  treatment.    05/24/2019 -  Anti-estrogen oral therapy   Anastrozole 1 mg daily     CHIEF COMPLIANT: Cycle 2 Kadcyla maintenance  INTERVAL HISTORY: Debbie Watts is a 58 y.o. with above-mentioned history of right breast cancer who completed neoadjuvant chemotherapy with [facility]P and underwent a right mastectomy. She is currently on adjuvant treatment with Kadcyla maintenance and anti-estrogen therapy with anastrozole. She presents to the clinic today for treatment.  REVIEW OF SYSTEMS:   Constitutional: Denies fevers, chills or abnormal weight loss Eyes: Denies blurriness of vision Ears, nose, mouth, throat, and face: Denies mucositis or sore throat Respiratory: Denies cough, dyspnea or wheezes Cardiovascular: Denies palpitation, chest discomfort Gastrointestinal: Denies nausea, heartburn or change in bowel habits Skin: Denies abnormal skin rashes Lymphatics: Denies new lymphadenopathy or easy bruising Neurological: Denies numbness, tingling or new weaknesses Behavioral/Psych: Mood is stable, no new changes  Extremities: No lower extremity edema Breast: denies any pain or lumps or nodules in either breasts All other systems were reviewed with the patient and are negative.  I have reviewed the past medical history, past surgical history, social history and family history with the patient and they are unchanged from previous note.  ALLERGIES:  is allergic to procaine.  MEDICATIONS:  Current Outpatient Medications  Medication Sig Dispense Refill  . anastrozole (ARIMIDEX) 1 MG tablet Take 1 tablet (1 mg total) by mouth daily. 90 tablet 3  . aspirin-acetaminophen-caffeine (EXCEDRIN MIGRAINE) 250-250-65 MG tablet Take 2 tablets by mouth every 6 (six) hours as needed for migraine.    Marland Kitchen  betamethasone valerate lotion (VALISONE) 0.1 % Apply 1 application topically daily as needed for irritation.     . carbidopa-levodopa (SINEMET IR) 10-100 MG tablet Take 1 tablet by mouth at bedtime. 90 tablet  3  . diclofenac (VOLTAREN) 75 MG EC tablet TAKE 1 TABLET BY MOUTH TWICE A DAY (Patient taking differently: Take 75 mg by mouth 2 (two) times daily. ) 60 tablet 5  . Dimethyl Fumarate 240 MG CPDR Take 1 capsule (240 mg total) by mouth 2 (two) times daily. 180 capsule 3  . diphenhydrAMINE (BENADRYL) 25 MG tablet Take 25 mg by mouth daily.    . fluconazole (DIFLUCAN) 100 MG tablet TAKE 1 TABLET BY MOUTH EVERY DAY 30 tablet 0  . fluticasone (FLONASE) 50 MCG/ACT nasal spray Place 2 sprays into both nostrils at bedtime.    . gabapentin (NEURONTIN) 300 MG capsule Take 2 capsules (600 mg total) by mouth 2 (two) times daily. 360 capsule 3  . lisinopril (ZESTRIL) 5 MG tablet TAKE 1 TABLET BY MOUTH EVERY DAY 90 tablet 1  . methocarbamol (ROBAXIN) 500 MG tablet Take 1 tablet (500 mg total) by mouth every 8 (eight) hours as needed for muscle spasms. 20 tablet 1  . Multiple Vitamin (MULTIVITAMIN WITH MINERALS) TABS tablet Take 2 tablets by mouth daily.    Marland Kitchen omeprazole (PRILOSEC OTC) 20 MG tablet Take 20 mg by mouth daily as needed (acid reflux).    Marland Kitchen oxyCODONE-acetaminophen (PERCOCET/ROXICET) 5-325 MG tablet Take 1 tablet by mouth every 8 (eight) hours as needed for severe pain. 30 tablet 0  . PARoxetine (PAXIL) 20 MG tablet TAKE 1 TABLET BY MOUTH EVERY DAY 90 tablet 1  . traMADol (ULTRAM) 50 MG tablet TAKE 1 TABLET BY MOUTH TWICE A DAY AS NEEDED (Patient taking differently: Take 50 mg by mouth 2 (two) times daily as needed for moderate pain. ) 60 tablet 3  . VENTOLIN HFA 108 (90 Base) MCG/ACT inhaler Inhale 1-2 puffs into the lungs every 6 (six) hours as needed for wheezing or shortness of breath. (Patient taking differently: Inhale 2 puffs into the lungs every 6 (six) hours as needed for wheezing or shortness of breath. ) 1 Inhaler 3   No current facility-administered medications for this visit.     PHYSICAL EXAMINATION: ECOG PERFORMANCE STATUS: 1 - Symptomatic but completely ambulatory  Vitals:    06/14/19 1052  BP: 132/81  Pulse: 81  Resp: 18  Temp: 98.7 F (37.1 C)  SpO2: 99%   Filed Weights   06/14/19 1052  Weight: 119 lb 12.8 oz (54.3 kg)    GENERAL: alert, no distress and comfortable SKIN: skin color, texture, turgor are normal, no rashes or significant lesions EYES: normal, Conjunctiva are pink and non-injected, sclera clear OROPHARYNX: no exudate, no erythema and lips, buccal mucosa, and tongue normal  NECK: supple, thyroid normal size, non-tender, without nodularity LYMPH: no palpable lymphadenopathy in the cervical, axillary or inguinal LUNGS: clear to auscultation and percussion with normal breathing effort HEART: regular rate & rhythm and no murmurs and no lower extremity edema ABDOMEN: abdomen soft, non-tender and normal bowel sounds MUSCULOSKELETAL: no cyanosis of digits and no clubbing  NEURO: alert & oriented x 3 with fluent speech, no focal motor/sensory deficits EXTREMITIES: No lower extremity edema  LABORATORY DATA:  I have reviewed the data as listed CMP Latest Ref Rng & Units 05/24/2019 04/30/2019 04/25/2019  Glucose 70 - 99 mg/dL 77 83 101(H)  BUN 6 - 20 mg/dL 19 - 19  Creatinine 0.44 -  1.00 mg/dL 0.80 - 0.80  Sodium 135 - 145 mmol/L 142 140 137  Potassium 3.5 - 5.1 mmol/L 4.0 3.3(L) 5.2(H)  Chloride 98 - 111 mmol/L 106 - 104  CO2 22 - 32 mmol/L 26 - 22  Calcium 8.9 - 10.3 mg/dL 8.8(L) - 8.7(L)  Total Protein 6.5 - 8.1 g/dL 6.3(L) - 5.7(L)  Total Bilirubin 0.3 - 1.2 mg/dL 0.2(L) - 1.6(H)  Alkaline Phos 38 - 126 U/L 93 - 69  AST 15 - 41 U/L 19 - 53(H)  ALT 0 - 44 U/L 24 - 40    Lab Results  Component Value Date   WBC 6.8 06/14/2019   HGB 13.1 06/14/2019   HCT 39.9 06/14/2019   MCV 98.5 06/14/2019   PLT 285 06/14/2019   NEUTROABS 5.5 06/14/2019    ASSESSMENT & PLAN:  Malignant neoplasm of upper-outer quadrant of right breast in female, estrogen receptor positive ([place]) 11/21/2018:Screening detected right breast mass upper outer quadrant, in  addition to areas of calcifications which were not biopsied. By ultrasound she had 2 breast masses 12 o'clock position 2.9 cm: Grade 2 IDC with high-grade DCIS ER 100%, PR 70%, Ki-67 10%, HER-2 3+ by IHC and 11 o'clock position 1 cm, ER 90%, PR 50%, Ki-67 15%, HER-2 3+ by IHC, T2N0 stage Ib  Neoadjuvant chemotherapy with [facility]eta x6 cycles 12/12/2018-03/27/2019  04/30/2019:Right mastectomy: Grade 2 IDC, 3.9 cm, high-grade DCIS, margins are negative, 0/2 lymph nodes negative, ER 95%, PR 80%, HER-2 2+ equivocal, T2 N0  Treatment plan: 1.Adjuvant Kadcyla 2.Adjuvant antiestrogen therapy with anastrozole 1 mg p.o. daily x7 years started 05/24/2019 ----------------------------------------------------------------------------------------------------------------------------- Current treatment: Cycle 1 Kadcyla maintenance started 05/24/2019 Labs reviewed Kadcyla toxicities: Patient has not been experiencing any major side effects to Kadcyla. Echocardiogram will be done every 3 months No adverse effects to anastrozole therapy.  Return to clinic in 3 weeks for Kadcyla treatment   No orders of the defined types were placed in this encounter.  The patient has a good understanding of the overall plan. she agrees with it. she will call with any problems that may develop before the next visit here.  Nicholas Lose, MD 06/14/2019  Julious Oka Dorshimer am acting as scribe for Dr. Nicholas Lose.  I have reviewed the above documentation for accuracy and completeness, and I agree with the above.

## 2019-06-14 ENCOUNTER — Other Ambulatory Visit: Payer: Self-pay

## 2019-06-14 ENCOUNTER — Inpatient Hospital Stay (HOSPITAL_BASED_OUTPATIENT_CLINIC_OR_DEPARTMENT_OTHER): Payer: Medicare HMO | Admitting: Hematology and Oncology

## 2019-06-14 ENCOUNTER — Inpatient Hospital Stay: Payer: Medicare HMO

## 2019-06-14 ENCOUNTER — Inpatient Hospital Stay: Payer: Medicare HMO | Attending: Hematology and Oncology

## 2019-06-14 ENCOUNTER — Inpatient Hospital Stay: Payer: Medicare HMO | Admitting: Nutrition

## 2019-06-14 DIAGNOSIS — C50411 Malignant neoplasm of upper-outer quadrant of right female breast: Secondary | ICD-10-CM

## 2019-06-14 DIAGNOSIS — Z95828 Presence of other vascular implants and grafts: Secondary | ICD-10-CM

## 2019-06-14 DIAGNOSIS — Z170 Estrogen receptor positive status [ER+]: Secondary | ICD-10-CM

## 2019-06-14 DIAGNOSIS — Z5112 Encounter for antineoplastic immunotherapy: Secondary | ICD-10-CM | POA: Diagnosis not present

## 2019-06-14 DIAGNOSIS — Z9221 Personal history of antineoplastic chemotherapy: Secondary | ICD-10-CM | POA: Insufficient documentation

## 2019-06-14 DIAGNOSIS — Z79811 Long term (current) use of aromatase inhibitors: Secondary | ICD-10-CM | POA: Insufficient documentation

## 2019-06-14 DIAGNOSIS — Z9011 Acquired absence of right breast and nipple: Secondary | ICD-10-CM | POA: Insufficient documentation

## 2019-06-14 DIAGNOSIS — Z7982 Long term (current) use of aspirin: Secondary | ICD-10-CM | POA: Insufficient documentation

## 2019-06-14 DIAGNOSIS — Z79899 Other long term (current) drug therapy: Secondary | ICD-10-CM | POA: Diagnosis not present

## 2019-06-14 LAB — CMP (CANCER CENTER ONLY)
ALT: 41 U/L (ref 0–44)
AST: 33 U/L (ref 15–41)
Albumin: 4.1 g/dL (ref 3.5–5.0)
Alkaline Phosphatase: 91 U/L (ref 38–126)
Anion gap: 12 (ref 5–15)
BUN: 16 mg/dL (ref 6–20)
CO2: 25 mmol/L (ref 22–32)
Calcium: 9.2 mg/dL (ref 8.9–10.3)
Chloride: 102 mmol/L (ref 98–111)
Creatinine: 0.79 mg/dL (ref 0.44–1.00)
GFR, Est AFR Am: >60 mL/min (ref >60)
GFR, Estimated: >60 mL/min (ref >60)
Glucose, Bld: 104 mg/dL — ABNORMAL HIGH (ref 70–99)
Potassium: 3.8 mmol/L (ref 3.5–5.1)
Sodium: 139 mmol/L (ref 135–145)
Total Bilirubin: 0.3 mg/dL (ref 0.3–1.2)
Total Protein: 7 g/dL (ref 6.5–8.1)

## 2019-06-14 LAB — CBC WITH DIFFERENTIAL (CANCER CENTER ONLY)
Abs Immature Granulocytes: 0.01 10*3/uL (ref 0.00–0.07)
Basophils Absolute: 0.1 10*3/uL (ref 0.0–0.1)
Basophils Relative: 1 %
Eosinophils Absolute: 0.1 10*3/uL (ref 0.0–0.5)
Eosinophils Relative: 2 %
HCT: 39.9 % (ref 36.0–46.0)
Hemoglobin: 13.1 g/dL (ref 12.0–15.0)
Immature Granulocytes: 0 %
Lymphocytes Relative: 8 %
Lymphs Abs: 0.5 10*3/uL — ABNORMAL LOW (ref 0.7–4.0)
MCH: 32.3 pg (ref 26.0–34.0)
MCHC: 32.8 g/dL (ref 30.0–36.0)
MCV: 98.5 fL (ref 80.0–100.0)
Monocytes Absolute: 0.6 10*3/uL (ref 0.1–1.0)
Monocytes Relative: 9 %
Neutro Abs: 5.5 10*3/uL (ref 1.7–7.7)
Neutrophils Relative %: 80 %
Platelet Count: 285 10*3/uL (ref 150–400)
RBC: 4.05 MIL/uL (ref 3.87–5.11)
RDW: 14 % (ref 11.5–15.5)
WBC Count: 6.8 10*3/uL (ref 4.0–10.5)
nRBC: 0 % (ref 0.0–0.2)

## 2019-06-14 MED ORDER — SODIUM CHLORIDE 0.9% FLUSH
10.0000 mL | INTRAVENOUS | Status: DC | PRN
  Administered 2019-06-14: 10 mL
  Filled 2019-06-14: qty 10

## 2019-06-14 MED ORDER — SODIUM CHLORIDE 0.9 % IV SOLN
3.6000 mg/kg | Freq: Once | INTRAVENOUS | Status: AC
  Administered 2019-06-14: 13:00:00 200 mg via INTRAVENOUS
  Filled 2019-06-14: qty 10

## 2019-06-14 MED ORDER — OXYCODONE-ACETAMINOPHEN 5-325 MG PO TABS: 1.0000 | ORAL_TABLET | Freq: Three times a day (TID) | ORAL | 0 refills | Status: DC | PRN

## 2019-06-14 MED ORDER — HEPARIN SOD (PORK) LOCK FLUSH 100 UNIT/ML IV SOLN
500.0000 [IU] | Freq: Once | INTRAVENOUS | Status: AC | PRN
  Administered 2019-06-14: 500 [IU]
  Filled 2019-06-14: qty 5

## 2019-06-14 MED ORDER — SODIUM CHLORIDE 0.9 % IV SOLN
Freq: Once | INTRAVENOUS | Status: AC
  Administered 2019-06-14: 12:00:00 via INTRAVENOUS
  Filled 2019-06-14: qty 250

## 2019-06-14 MED ORDER — ACETAMINOPHEN 325 MG PO TABS
650.0000 mg | ORAL_TABLET | Freq: Once | ORAL | Status: AC
  Administered 2019-06-14: 650 mg via ORAL

## 2019-06-14 MED ORDER — SODIUM CHLORIDE 0.9% FLUSH
10.0000 mL | Freq: Once | INTRAVENOUS | Status: AC
  Administered 2019-06-14: 10 mL
  Filled 2019-06-14: qty 10

## 2019-06-14 MED ORDER — DIPHENHYDRAMINE HCL 25 MG PO CAPS
ORAL_CAPSULE | ORAL | Status: AC
  Filled 2019-06-14: qty 2

## 2019-06-14 MED ORDER — ACETAMINOPHEN 325 MG PO TABS
ORAL_TABLET | ORAL | Status: AC
  Filled 2019-06-14: qty 2

## 2019-06-14 MED ORDER — DIPHENHYDRAMINE HCL 25 MG PO CAPS
50.0000 mg | ORAL_CAPSULE | Freq: Once | ORAL | Status: AC
  Administered 2019-06-14: 50 mg via ORAL

## 2019-06-14 NOTE — Progress Notes (Signed)
Patient was identified to be at risk for malnutrition on the MST secondary to poor appetite and weight loss.  58 year old female diagnosed with breast cancer on Kadcyla maintenance therapy.  Past medical history includes MS, migraines, hypertension, and depression.  Medications include multivitamin, Prilosec, and a Rheumatrex.  Labs were reviewed.  Height: 5 feet 2 inches. Weight: 119.8 pounds. Usual body weight: 138 pounds Mar 06, 2019. BMI: 21.91.  Patient reports that she has been trying to lose weight because she felt she was too heavy. Reports she eats 2 meals daily and sometimes some snacks. She lives with her boyfriend who is diabetic so she has to cook meals. She denies nutrition impact symptoms.  Nutrition diagnosis: Food and nutrition related knowledge deficit related to breast cancer and associated treatments as evidenced by no prior need for nutrition related information.  Intervention: Patient educated on importance of weight maintenance and adequate calories and protein to support healing. Suggested patient may want to try a protein shake between meals.  I provided several examples and samples. Provided fact sheets on soft protein foods. Patient has my contact information for questions or concerns.  Nutrition diagnosis resolved.  **Disclaimer: This note was dictated with voice recognition software. Similar sounding words can inadvertently be transcribed and this note may contain transcription errors which may not have been corrected upon publication of note.**

## 2019-06-14 NOTE — Patient Instructions (Signed)
Cancer Center Discharge Instructions for Patients Receiving Chemotherapy  Today you received the following chemotherapy agents Ado-trastuzumab (KADCYLA).  To help prevent nausea and vomiting after your treatment, we encourage you to take your nausea medication as prescribed.   If you develop nausea and vomiting that is not controlled by your nausea medication, call the clinic.   BELOW ARE SYMPTOMS THAT SHOULD BE REPORTED IMMEDIATELY:  *FEVER GREATER THAN 100.5 F  *CHILLS WITH OR WITHOUT FEVER  NAUSEA AND VOMITING THAT IS NOT CONTROLLED WITH YOUR NAUSEA MEDICATION  *UNUSUAL SHORTNESS OF BREATH  *UNUSUAL BRUISING OR BLEEDING  TENDERNESS IN MOUTH AND THROAT WITH OR WITHOUT PRESENCE OF ULCERS  *URINARY PROBLEMS  *BOWEL PROBLEMS  UNUSUAL RASH Items with * indicate a potential emergency and should be followed up as soon as possible.  Feel free to call the clinic should you have any questions or concerns. The clinic phone number is [phone].  Please show the CHEMO ALERT CARD at check-in to the Emergency Department and triage nurse.  Coronavirus (COVID-19) Are you at risk?  Are you at risk for the Coronavirus (COVID-19)?  To be considered HIGH RISK for Coronavirus (COVID-19), you have to meet the following criteria:  . Traveled to [place], [place], [place], [place] or [place]; or in the [place] to [place], [place], [place], or [place]; and have fever, cough, and shortness of breath within the last 2 weeks of travel OR . Been in close contact with a person diagnosed with COVID-19 within the last 2 weeks and have fever, cough, and shortness of breath . IF YOU DO NOT MEET THESE CRITERIA, YOU ARE CONSIDERED LOW RISK FOR COVID-19.  What to do if you are HIGH RISK for COVID-19?  . If you are having a medical emergency, call 911. . Seek medical care right away. Before you go to a doctor's office, urgent care or emergency department, call ahead and tell  them about your recent travel, contact with someone diagnosed with COVID-19, and your symptoms. You should receive instructions from your physician's office regarding next steps of care.  . When you arrive at healthcare provider, tell the healthcare staff immediately you have returned from visiting [place], [place], [place], [place] or [place]; or traveled in the [place] to [place], [place], [place], or [place]; in the last two weeks or you have been in close contact with a person diagnosed with COVID-19 in the last 2 weeks.   . Tell the health care staff about your symptoms: fever, cough and shortness of breath. . After you have been seen by a medical provider, you will be either: o Tested for (COVID-19) and discharged home on quarantine except to seek medical care if symptoms worsen, and asked to  - Stay home and avoid contact with others until you get your results (4-5 days)  - Avoid travel on public transportation if possible (such as bus, train, or airplane) or o Sent to the Emergency Department by EMS for evaluation, COVID-19 testing, and possible admission depending on your condition and test results.  What to do if you are LOW RISK for COVID-19?  Reduce your risk of any infection by using the same precautions used for avoiding the common cold or flu:  . Wash your hands often with soap and warm water for at least 20 seconds.  If soap and water are not readily available, use an alcohol-based hand sanitizer with at least 60% alcohol.  . If coughing or   sneezing, cover your mouth and nose by coughing or sneezing into the elbow areas of your shirt or coat, into a tissue or into your sleeve (not your hands). . Avoid shaking hands with others and consider head nods or verbal greetings only. . Avoid touching your eyes, nose, or mouth with unwashed hands.  . Avoid close contact with people who are sick. . Avoid places or events with large numbers of people in one location, like concerts or  sporting events. . Carefully consider travel plans you have or are making. . If you are planning any travel outside or inside the [place], visit the CDC's Travelers' Health webpage for the latest health notices. . If you have some symptoms but not all symptoms, continue to monitor at home and seek medical attention if your symptoms worsen. . If you are having a medical emergency, call 911.   [place] / e-Visit: [email or web address]         [facility]: [facility]: [phone]                   [facility]: [phone]

## 2019-06-15 ENCOUNTER — Ambulatory Visit (HOSPITAL_BASED_OUTPATIENT_CLINIC_OR_DEPARTMENT_OTHER)
Admission: RE | Admit: 2019-06-15 | Discharge: 2019-06-15 | Disposition: A | Discharge status: Home / Self Care | Payer: Medicare HMO | Source: Ambulatory Visit | Attending: Internal Medicine | Admitting: Internal Medicine

## 2019-06-15 ENCOUNTER — Encounter (HOSPITAL_COMMUNITY): Payer: Self-pay | Admitting: Internal Medicine

## 2019-06-15 ENCOUNTER — Ambulatory Visit (HOSPITAL_COMMUNITY)
Admission: RE | Admit: 2019-06-15 | Discharge: 2019-06-15 | Disposition: A | Discharge status: Home / Self Care | Payer: Medicare HMO | Source: Ambulatory Visit | Attending: Internal Medicine | Admitting: Internal Medicine

## 2019-06-15 VITALS — BP 120/84 | HR 66 | Wt 121.6 lb

## 2019-06-15 DIAGNOSIS — R55 Syncope and collapse: Secondary | ICD-10-CM | POA: Diagnosis not present

## 2019-06-15 DIAGNOSIS — Z170 Estrogen receptor positive status [ER+]: Secondary | ICD-10-CM

## 2019-06-15 DIAGNOSIS — I10 Essential (primary) hypertension: Secondary | ICD-10-CM | POA: Diagnosis not present

## 2019-06-15 DIAGNOSIS — Z79811 Long term (current) use of aromatase inhibitors: Secondary | ICD-10-CM | POA: Diagnosis not present

## 2019-06-15 DIAGNOSIS — F17210 Nicotine dependence, cigarettes, uncomplicated: Secondary | ICD-10-CM | POA: Insufficient documentation

## 2019-06-15 DIAGNOSIS — F329 Major depressive disorder, single episode, unspecified: Secondary | ICD-10-CM | POA: Insufficient documentation

## 2019-06-15 DIAGNOSIS — Z791 Long term (current) use of non-steroidal anti-inflammatories (NSAID): Secondary | ICD-10-CM | POA: Diagnosis not present

## 2019-06-15 DIAGNOSIS — C50411 Malignant neoplasm of upper-outer quadrant of right female breast: Secondary | ICD-10-CM

## 2019-06-15 DIAGNOSIS — Z79899 Other long term (current) drug therapy: Secondary | ICD-10-CM | POA: Diagnosis not present

## 2019-06-15 DIAGNOSIS — G35 Multiple sclerosis: Secondary | ICD-10-CM | POA: Diagnosis not present

## 2019-06-15 DIAGNOSIS — J45909 Unspecified asthma, uncomplicated: Secondary | ICD-10-CM | POA: Diagnosis not present

## 2019-06-15 DIAGNOSIS — Z720 Tobacco use: Secondary | ICD-10-CM | POA: Diagnosis not present

## 2019-06-15 DIAGNOSIS — M1990 Unspecified osteoarthritis, unspecified site: Secondary | ICD-10-CM | POA: Insufficient documentation

## 2019-06-15 DIAGNOSIS — J449 Chronic obstructive pulmonary disease, unspecified: Secondary | ICD-10-CM | POA: Diagnosis not present

## 2019-06-15 DIAGNOSIS — Z9011 Acquired absence of right breast and nipple: Secondary | ICD-10-CM | POA: Insufficient documentation

## 2019-06-15 NOTE — Patient Instructions (Signed)
Your physician has requested that you have an echocardiogram. Echocardiography is a painless test that uses sound waves to create images of your heart. It provides your doctor with information about the size and shape of your heart and how well your heart's chambers and valves are working. This procedure takes approximately one hour. There are no restrictions for this procedure.  Your physician recommends that you schedule a follow-up appointment in: 3 months.  At the [facility], you and your health needs are our priority. As part of our continuing mission to provide you with exceptional heart care, we have created designated Provider Care Teams. These Care Teams include your primary Cardiologist (physician) and Advanced Practice Providers (APPs- Physician Assistants and Nurse Practitioners) who all work together to provide you with the care you need, when you need it.   You may see any of the following providers on your designated Care Team at your next follow up: . Dr Daniel Bensimhon . Dr Dalton McLean . Amy Clegg, NP   Please be sure to bring in all your medications bottles to every appointment.    

## 2019-06-15 NOTE — Progress Notes (Signed)
  Echocardiogram 2D Echocardiogram has been performed.  Debbie Watts 06/15/2019, 12:01 PM

## 2019-06-15 NOTE — Progress Notes (Signed)
Cardio-Oncology Clinic Note  Due to national recommendations of social distancing due to [place] 19, Audio/video telehealth visit is felt to be most appropriate for this patient at this time.  See MyChart message from today for patient consent regarding telehealth for [facility].  Date:  06/15/2019   ID:  Debbie Watts, DOB 1961/03/08, MRN [id number]  Location: Home  Provider location: [facility] Type of Visit: New patient (consult)  PCP:  Neva Seat, MD  Cardiologist:  No primary care provider on file. Primary HF: Rendell Thivierge Referring: [place]  Chief Complaint: Breast CA   History of Present Illness:  Debbie Watts is a 58y/o woman with HTN, migraines, multiple sclerosis and right breast CA referred by Dr. Lindi Adie for enrollment into the cardio-oncology clinic for monitoring during her chemotherapy.  Diagnosed with R breast CA in 2/20. Started neo-adjuvant chemo with [facility] in 2/20.   Not overly activity.On disability due to MS. 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   Remaans on herceptin q 3 weeks. Doing well. No CP or SOB. No edema. No further syncope. Continues to smoke.  Echo today EF 60-65% GLS -16.9% (underestimated due to poor endocardial tracking).   Echo 12/06/18 EF 65-70% GLS -19.2 Echo 03/07/19  EF 60-65% GLS -13.4% (poor tracking)  SUMMARY OF ONCOLOGIC HISTORY:       Malignant neoplasm of upper-outer quadrant of right breast in female, estrogen receptor positive ([place])   11/21/2018 Initial Diagnosis    Screening detected right breast mass upper outer quadrant, in addition to areas of calcifications which were not biopsied.  By ultrasound she had 2 breast masses 12 o'clock position 2.9 cm: Grade 2 IDC with high-grade DCIS ER 100%, PR 70%, Ki-67 10%, HER-2 3+ by IHC and 11 o'clock position 1 cm, ER 90%, PR 50%, Ki-67 15%, HER-2 3+ by IHC, T2N0 stage Ib    11/29/2018 Cancer Staging    Staging form: Breast, AJCC 8th Edition -  Clinical stage from 11/29/2018: Stage IB (cT2, cN0, cM0, G3, ER+, PR+, HER2+) - Signed by Nicholas Lose, MD on 11/29/2018    12/12/2018 -  Neo-Adjuvant Chemotherapy    Neoadjuvant chemotherapy with [facility]    01/18/2019 - 01/20/2019 Hospital Admission    Syncope      Debbie Watts denies symptoms worrisome for COVID 19.   Past Medical History:  Diagnosis Date  . Arthritis   . Asthma   . Cancer ([place])    chemo for breast ca  x16mo  . Depression   . Deviated nasal septum   . Hearing loss 06/11/2014   Bilateral  . Herniated cervical disc   . Hypertension   . Migraine   . Miscarriage    2  . [place]) [place] [place]    Past Surgical History:  Procedure Laterality Date  . HEMATOMA EVACUATION Right 05/01/2019   Procedure: EVACUATION HEMATOMA;  Surgeon: WRolm Bookbinder MD;  [facility]  Service: General;  Laterality: Right;  . MASTECTOMY W/ SENTINEL NODE BIOPSY Right 04/30/2019   Procedure: RIGHT MASTECTOMY WITH  RIGHT AXILLARY SENTINEL LYMPH NODE BIOPSY INJECT BLUE DYE;  Surgeon: WRolm Bookbinder MD;  [facility]  Service: General;  Laterality: Right;  . NASAL SEPTUM SURGERY    . SIMPLE MASTECTOMY WITH AXILLARY SENTINEL NODE BIOPSY Right 04/30/2019  . spinal injections       Current Outpatient Medications  Medication Sig Dispense Refill  . anastrozole (ARIMIDEX) 1 MG tablet Take 1 tablet (1  mg total) by mouth daily. 90 tablet 3  . aspirin-acetaminophen-caffeine (EXCEDRIN MIGRAINE) 250-250-65 MG tablet Take 2 tablets by mouth every 6 (six) hours as needed for migraine.    . betamethasone valerate lotion (VALISONE) 0.1 % Apply 1 application topically daily as needed for irritation.     . carbidopa-levodopa (SINEMET IR) 10-100 MG tablet Take 1 tablet by mouth at bedtime. 90 tablet 3  . diclofenac (VOLTAREN) 75 MG EC tablet TAKE 1 TABLET BY MOUTH TWICE A DAY (Patient taking differently: Take 75 mg by mouth 2 (two) times daily. ) 60 tablet 5  .  Dimethyl Fumarate 240 MG CPDR Take 1 capsule (240 mg total) by mouth 2 (two) times daily. 180 capsule 3  . diphenhydrAMINE (BENADRYL) 25 MG tablet Take 25 mg by mouth daily.    . fluticasone (FLONASE) 50 MCG/ACT nasal spray Place 2 sprays into both nostrils at bedtime.    . gabapentin (NEURONTIN) 300 MG capsule Take 2 capsules (600 mg total) by mouth 2 (two) times daily. 360 capsule 3  . lisinopril (ZESTRIL) 5 MG tablet TAKE 1 TABLET BY MOUTH EVERY DAY 90 tablet 1  . methocarbamol (ROBAXIN) 500 MG tablet Take 1 tablet (500 mg total) by mouth every 8 (eight) hours as needed for muscle spasms. 20 tablet 1  . Multiple Vitamin (MULTIVITAMIN WITH MINERALS) TABS tablet Take 2 tablets by mouth daily.    Marland Kitchen omeprazole (PRILOSEC OTC) 20 MG tablet Take 20 mg by mouth daily as needed (acid reflux).    Marland Kitchen oxyCODONE-acetaminophen (PERCOCET/ROXICET) 5-325 MG tablet Take 1 tablet by mouth every 8 (eight) hours as needed for severe pain. 30 tablet 0  . PARoxetine (PAXIL) 20 MG tablet TAKE 1 TABLET BY MOUTH EVERY DAY 90 tablet 1  . traMADol (ULTRAM) 50 MG tablet TAKE 1 TABLET BY MOUTH TWICE A DAY AS NEEDED (Patient taking differently: Take 50 mg by mouth 2 (two) times daily as needed for moderate pain. ) 60 tablet 3  . VENTOLIN HFA 108 (90 Base) MCG/ACT inhaler Inhale 1-2 puffs into the lungs every 6 (six) hours as needed for wheezing or shortness of breath. (Patient taking differently: Inhale 2 puffs into the lungs every 6 (six) hours as needed for wheezing or shortness of breath. ) 1 Inhaler 3   No current facility-administered medications for this encounter.     Allergies:   Procaine   Social History:  The patient  reports that she has been smoking cigarettes. She has a 22.00 pack-year smoking history. She has never used smokeless tobacco. She reports current alcohol use. She reports current drug use. Drug: Marijuana.   Family History:  The patient's family history includes Cancer in her maternal uncle. She was  adopted.   ROS:  Please see the history of present illness.   All other systems are personally reviewed and negative.   Vitals:   06/15/19 1215  BP: 120/84  Pulse: 66  SpO2: 94%     Exam:  General:  Well appearing. No resp difficulty HEENT: normal Neck: supple. no JVD. Carotids 2+ bilat; no bruits. No lymphadenopathy or thryomegaly appreciated. Cor: PMI nondisplaced. Regular rate & rhythm. No rubs, gallops or murmurs. Left port Lungs: clear with decreased BS throguhout Abdomen: soft, nontender, nondistended. No hepatosplenomegaly. No bruits or masses. Good bowel sounds. Extremities: no cyanosis, clubbing, rash, edema Neuro: alert & orientedx3, cranial nerves grossly intact. moves all 4 extremities w/o difficulty. Affect pleasant  Recent Labs: 01/19/2019: Magnesium 2.1 06/14/2019: ALT 41; BUN 16; Creatinine  0.79; Hemoglobin 13.1; Platelet Count 285; Potassium 3.8; Sodium 139  Personally reviewed   Wt Readings from Last 3 Encounters:  06/14/19 54.3 kg (119 lb 12.8 oz)  05/24/19 56 kg (123 lb 6.4 oz)  04/30/19 64.3 kg (141 lb 12.1 oz)      ASSESSMENT AND PLAN:  1. R breast cancer - I reviewed echos personally. EF and Doppler parameters stable. No HF on exam. Continue Herceptin.  2. Syncope - Suspect due to high vagal tone associated with defecation syncope. W/u negative. If recurs will place monitor.   3. Tobacco use, ongoing - cessation encouraged but she says it is hard to quit because her boyfriend is still smoking.   Signed, Glori Bickers, MD  06/15/2019 11:59 AM  [facility] [place] [address] [phone] (office) [phone] (fax)

## 2019-06-18 ENCOUNTER — Other Ambulatory Visit: Payer: Self-pay | Admitting: Internal Medicine

## 2019-06-18 NOTE — Telephone Encounter (Signed)
Refill approved.

## 2019-06-21 ENCOUNTER — Other Ambulatory Visit: Payer: Self-pay

## 2019-06-21 ENCOUNTER — Ambulatory Visit: Payer: Medicare HMO | Admitting: Rehabilitation

## 2019-06-21 ENCOUNTER — Encounter: Payer: Self-pay | Admitting: Rehabilitation

## 2019-06-21 DIAGNOSIS — Z170 Estrogen receptor positive status [ER+]: Secondary | ICD-10-CM | POA: Diagnosis not present

## 2019-06-21 DIAGNOSIS — R293 Abnormal posture: Secondary | ICD-10-CM

## 2019-06-21 DIAGNOSIS — M25611 Stiffness of right shoulder, not elsewhere classified: Secondary | ICD-10-CM

## 2019-06-21 DIAGNOSIS — C50411 Malignant neoplasm of upper-outer quadrant of right female breast: Secondary | ICD-10-CM

## 2019-06-21 NOTE — Therapy (Signed)
[place], [place], [address] Phone: [phone]   Fax:  [phone]  Physical Therapy Treatment  Patient Details  Name: Debbie Watts MRN: [id number] Date of Birth: 01-08-1961 Referring Provider (PT): Dr. Donne Hazel   Encounter Date: 06/21/2019  PT End of Session - 06/21/19 1331    Visit Number  2    PT Start Time  1302    PT Stop Time  1331    PT Time Calculation (min)  29 min    Activity Tolerance  Patient tolerated treatment well    Behavior During Therapy  [facility] for tasks assessed/performed       Past Medical History:  Diagnosis Date  . Arthritis   . Asthma   . Cancer ([place])    chemo for breast ca  x60mo  . Depression   . Deviated nasal septum   . Hearing loss 06/11/2014   Bilateral  . Herniated cervical disc   . Hypertension   . Migraine   . Miscarriage    2  . MS (multiple sclerosis) ([facility]     Past Surgical History:  Procedure Laterality Date  . HEMATOMA EVACUATION Right 05/01/2019   Procedure: EVACUATION HEMATOMA;  Surgeon: WRolm Bookbinder MD;  [facility]  Service: General;  Laterality: Right;  . MASTECTOMY W/ SENTINEL NODE BIOPSY Right 04/30/2019   Procedure: RIGHT MASTECTOMY WITH  RIGHT AXILLARY SENTINEL LYMPH NODE BIOPSY INJECT BLUE DYE;  Surgeon: WRolm Bookbinder MD;  [facility]  Service: General;  Laterality: Right;  . NASAL SEPTUM SURGERY    . SIMPLE MASTECTOMY WITH AXILLARY SENTINEL NODE BIOPSY Right 04/30/2019  . spinal injections      There were no vitals filed for this visit.  Subjective Assessment - 06/21/19 1304    Subjective  It feeling pretty good.  No limitations.    Pertinent History  Rt mastectomy 04/30/19 with SLNB due to grade 2 IDC, high grade DCIS 0/2 lymph nodes. ER/PR positive, HER2 equivocal and then Rt breast hematoma evacuation on 05/01/19 by Dr. WDonne Hazel  Neo adjuvant chemotherapy [facility] started 12/12/18 to current. Future treatment  to include Kadcyla and anastrozole x 7 years. Other history includes MS, HTN, OA, asthma, bilateral hearing loss    Currently in Pain?  No/denies         [facility] - 06/21/19 0001      ROM / Strength   AROM / PROM / Strength  Strength      AROM   Right Shoulder Extension  75 Degrees    Right Shoulder Flexion  157 Degrees   slight pull   Right Shoulder ABduction  165 Degrees    Right Shoulder Internal Rotation  85 Degrees    Right Shoulder External Rotation  80 Degrees    Right Shoulder Horizontal ABduction  45 Degrees      Strength   Strength Assessment Site  Shoulder    Right/Left Shoulder  Right;Left    Right Shoulder Flexion  4/5    Right Shoulder Extension  4/5    Right Shoulder ABduction  4/5    Right Shoulder Internal Rotation  4+/5    Right Shoulder External Rotation  4+/5    Left Shoulder Flexion  4/5    Left Shoulder Extension  4/5    Left Shoulder ABduction  4/5    Left Shoulder Internal Rotation  4+/5    Left Shoulder External Rotation  4+/5  [place] Adult PT Treatment/Exercise - 06/21/19 0001      Exercises   Exercises  Shoulder      Shoulder Exercises: Supine   Other Supine Exercises  education and performance of supine scap per instruction section red band x 10 each.  Given as final HEP             PT Education - 06/21/19 1331    Education Details  final HEP    Person(s) Educated  Patient    Methods  Explanation;Demonstration;Tactile cues;Verbal cues;Handout    Comprehension  Verbalized understanding;Returned demonstration;Verbal cues required          PT Long Term Goals - 06/21/19 1311      PT LONG TERM GOAL #1   Title  pt will improve Rt shoulder AROM to within 5 degrees of the opposite UE to demonstrate return to baseline ROM    Status  Achieved      PT LONG TERM GOAL #2   Title  Pt will demonstrate 4+/5 MMT on the Rt UE or equal between sides    Status  Achieved      PT LONG TERM GOAL #3    Title  Pt will be educated on lymphedema and risk reduction strategies    Status  Achieved      PT LONG TERM GOAL #4   Title  pt will be ind with final HEP    Status  Achieved            Plan - 06/21/19 1332    Clinical Impression Statement  Pt has met all  goals upon arrival today and reports no limitations.  No more sessions needed.  Reviewed final HEP with patient today with no questions and no questions regarding lymphedema surveillance.    PT Home Exercise Plan  Access Code: [email or web address]    Consulted and Agree with Plan of Care  Patient       Patient will benefit from skilled therapeutic intervention in order to improve the following deficits and impairments:     Visit Diagnosis: Abnormal posture  Stiffness of right shoulder, not elsewhere classified  Malignant neoplasm of upper-outer quadrant of right breast in female, estrogen receptor positive ([place])     Problem List Patient Active Problem List   Diagnosis Date Noted  . Breast cancer, right ([place]) 04/30/2019  . Port-A-Cath in place 02/13/2019  . Syncope 01/19/2019  . Nausea vomiting and diarrhea 01/19/2019  . Leukocytosis 01/19/2019  . Malignant neoplasm of upper-outer quadrant of right breast in female, estrogen receptor positive ([place]) 11/27/2018  . Neurogenic claudication due to lumbar spinal stenosis 09/19/2018  . Urinary tract infection without hematuria 08/10/2018  . Chronic left-sided thoracic back pain 07/20/2018  . Tendon nodule 04/21/2018  . Solar lentigo 04/21/2018  . Preventative health care 04/20/2018  . Acute cystitis without hematuria 03/16/2018  . Gait disturbance 01/12/2018  . Polyuria 12/08/2017  . Easy bruising 08/31/2017  . Chronic obstructive pulmonary disease ([place]) 11/30/2016  . Essential hypertension 01/26/2016  . Migraine headache 06/23/2014  . Multiple sclerosis ([place]) 06/23/2014  . Paresthesias 06/23/2014  . Restless leg syndrome 06/23/2014  . Hearing loss, bilateral 06/11/2014  .  Major depression, recurrent, chronic ([place]) 06/11/2014  . Cervical herniated disc 06/11/2014  . Cigarette nicotine dependence without complication 78/58/8502  . Lumbar herniated disc 06/11/2014  . Perimenopausal symptoms 06/11/2014    Stark Bray 06/21/2019, 1:33 PM  [place], [place], [address]  Phone: [phone]   Fax:  [phone]  Name: Naarah Borgerding MRN: [id number] Date of Birth: 23-Jul-1961   PHYSICAL THERAPY DISCHARGE SUMMARY  Visits from Start of Care: 2  Current functional level related to goals / functional outcomes: All goals met.  Reports no limitations   Remaining deficits: Need for continued exercise   Education / Equipment: HEP/lymphedema risk reduction Plan: Patient agrees to discharge.  Patient goals were met. Patient is being discharged due to meeting the stated rehab goals.  ?????     Shan Levans, PT

## 2019-06-21 NOTE — Patient Instructions (Signed)
Access Code: [email or web address]  URL: [email or web address]  Date: 06/21/2019  Prepared by: Shan Levans   Program Notes  Stretching still good every day. Resistance bands 3x per week. Still keep walking! When you can easily do 20-30 with the red band switch to the green band.    Exercises  Doorway Pec Stretch at 90 Degrees Abduction - 10 reps - 1-3 sets - 20-30 seconds hold - 1x daily - 7x weekly  Supine Shoulder Horizontal Abduction with Resistance - 10 reps - 1-3 sets - 1x daily - 7x weekly  Supine Shoulder External Rotation with Resistance - 10 reps - 1-3 sets - 1x daily - 7x weekly  Standing shoulder flexion wall slides - 10 reps - 1-3 sets - 20-30 seconds hold - 1x daily - 7x weekly  Supine PNF D2 Flexion with Resistance - 10 reps - 1-3 sets - 1x daily - 7x weekly  Standing Row with Anchored Resistance - 10 reps - 1-3 sets - 2-3 second hold - 1x daily - 7x weekly

## 2019-06-25 ENCOUNTER — Other Ambulatory Visit: Payer: Self-pay | Admitting: General Surgery

## 2019-06-25 DIAGNOSIS — D225 Melanocytic nevi of trunk: Secondary | ICD-10-CM | POA: Diagnosis not present

## 2019-06-25 DIAGNOSIS — D235 Other benign neoplasm of skin of trunk: Secondary | ICD-10-CM | POA: Diagnosis not present

## 2019-06-29 ENCOUNTER — Other Ambulatory Visit: Payer: Self-pay | Admitting: Medical

## 2019-06-29 ENCOUNTER — Telehealth: Payer: Self-pay

## 2019-06-29 MED ORDER — OXYCODONE-ACETAMINOPHEN 5-325 MG PO TABS: 1.0000 | ORAL_TABLET | Freq: Three times a day (TID) | ORAL | 0 refills | Status: DC | PRN

## 2019-06-29 NOTE — Telephone Encounter (Signed)
Pt requesting refill on Oxycodone for chronic back pain.    RN reviewed with Sandi Mealy, PA-C due to MD out of office.  Refill approved.  Patient notified.

## 2019-07-04 ENCOUNTER — Telehealth: Payer: Self-pay | Admitting: Hematology and Oncology

## 2019-07-04 NOTE — Telephone Encounter (Signed)
Called patient regarding prescreening questions, patient has been asked and notified.

## 2019-07-04 NOTE — Progress Notes (Signed)
Patient Care Team: Neva Seat, MD as PCP - General (Internal Medicine) Excell Seltzer, MD as Consulting Physician (General Surgery) Nicholas Lose, MD as Consulting Physician (Hematology and Oncology) Kyung Rudd, MD as Consulting Physician (Radiation Oncology)  DIAGNOSIS:    ICD-10-CM   1. Malignant neoplasm of upper-outer quadrant of right breast in female, estrogen receptor positive ([place])  C50.411    Z17.0     SUMMARY OF ONCOLOGIC HISTORY: Oncology History  Malignant neoplasm of upper-outer quadrant of right breast in female, estrogen receptor positive ([place])  11/21/2018 Initial Diagnosis   Screening detected right breast mass upper outer quadrant, in addition to areas of calcifications which were not biopsied.  By ultrasound she had 2 breast masses 12 o'clock position 2.9 cm: Grade 2 IDC with high-grade DCIS ER 100%, PR 70%, Ki-67 10%, HER-2 3+ by IHC and 11 o'clock position 1 cm, ER 90%, PR 50%, Ki-67 15%, HER-2 3+ by IHC, T2N0 stage Ib   11/29/2018 Cancer Staging   Staging form: Breast, AJCC 8th Edition - Clinical stage from 11/29/2018: Stage IB (cT2, cN0, cM0, G3, ER+, PR+, HER2+) - Signed by Nicholas Lose, MD on 11/29/2018   12/12/2018 -  Neo-Adjuvant Chemotherapy   Neoadjuvant chemotherapy with [facility]   01/18/2019 - 01/20/2019 Hospital Admission   Syncope   04/30/2019 Surgery   Right mastectomy: Grade 2 IDC, 3.9 cm, high-grade DCIS, margins are negative, 0/2 lymph nodes negative, ER 95%, PR 80%, HER-2 2+ equivocal, T2 N0   04/30/2019 Cancer Staging   Staging form: Breast, AJCC 8th Edition - Pathologic stage from 04/30/2019: No Stage Recommended (ypT2, pN0, cM0, G2, ER+, PR+, HER2-) - Signed by Gardenia Phlegm, NP on 05/09/2019   05/24/2019 -  Chemotherapy   The patient had ado-trastuzumab emtansine (KADCYLA) 200 mg in sodium chloride 0.9 % 250 mL chemo infusion, 240 mg, Intravenous, Once, 2 of 8 cycles Administration: 200 mg (05/24/2019), 200 mg (06/14/2019)  for  chemotherapy treatment.    05/24/2019 -  Anti-estrogen oral therapy   Anastrozole 1 mg daily     CHIEF COMPLIANT: Follow-up of Kadcyla maintenance  INTERVAL HISTORY: Debbie Watts is a 58 y.o. with above-mentioned history of right breast cancer who completed neoadjuvant chemotherapy with TCHP and underwent a right mastectomy.She is currently on adjuvant treatment with Kadcyla maintenance and anti-estrogen therapy with anastrozole. She presents to the clinic today for treatment. She has noticed complete loss of appetite.  She has excellent energy levels.  Continues to have back pain for which she takes oxycodone.  REVIEW OF SYSTEMS:   Constitutional: Denies fevers, chills or abnormal weight loss Eyes: Denies blurriness of vision Ears, nose, mouth, throat, and face: Denies mucositis or sore throat Respiratory: Denies cough, dyspnea or wheezes Cardiovascular: Denies palpitation, chest discomfort Gastrointestinal: Denies nausea, heartburn or change in bowel habits Skin: Denies abnormal skin rashes Lymphatics: Denies new lymphadenopathy or easy bruising Neurological: Denies numbness, tingling or new weaknesses Behavioral/Psych: Mood is stable, no new changes  Extremities: No lower extremity edema Breast: denies any pain or lumps or nodules in either breasts All other systems were reviewed with the patient and are negative.  I have reviewed the past medical history, past surgical history, social history and family history with the patient and they are unchanged from previous note.  ALLERGIES:  is allergic to procaine.  MEDICATIONS:  Current Outpatient Medications  Medication Sig Dispense Refill  . anastrozole (ARIMIDEX) 1 MG tablet Take 1 tablet (1 mg total) by mouth daily. 90 tablet  3  . aspirin-acetaminophen-caffeine (EXCEDRIN MIGRAINE) 250-250-65 MG tablet Take 2 tablets by mouth every 6 (six) hours as needed for migraine.    . betamethasone valerate lotion (VALISONE) 0.1 % Apply  1 application topically daily as needed for irritation.     . carbidopa-levodopa (SINEMET IR) 10-100 MG tablet Take 1 tablet by mouth at bedtime. 90 tablet 3  . diclofenac (VOLTAREN) 75 MG EC tablet Take 1 tablet (75 mg total) by mouth 2 (two) times daily. 60 tablet 5  . Dimethyl Fumarate 240 MG CPDR Take 1 capsule (240 mg total) by mouth 2 (two) times daily. 180 capsule 3  . diphenhydrAMINE (BENADRYL) 25 MG tablet Take 25 mg by mouth daily.    . fluticasone (FLONASE) 50 MCG/ACT nasal spray Place 2 sprays into both nostrils at bedtime.    . gabapentin (NEURONTIN) 300 MG capsule Take 2 capsules (600 mg total) by mouth 2 (two) times daily. 360 capsule 3  . lisinopril (ZESTRIL) 5 MG tablet TAKE 1 TABLET BY MOUTH EVERY DAY 90 tablet 1  . methocarbamol (ROBAXIN) 500 MG tablet Take 1 tablet (500 mg total) by mouth every 8 (eight) hours as needed for muscle spasms. 20 tablet 1  . Multiple Vitamin (MULTIVITAMIN WITH MINERALS) TABS tablet Take 2 tablets by mouth daily.    Marland Kitchen omeprazole (PRILOSEC OTC) 20 MG tablet Take 20 mg by mouth daily as needed (acid reflux).    Marland Kitchen oxyCODONE-acetaminophen (PERCOCET/ROXICET) 5-325 MG tablet Take 1 tablet by mouth every 8 (eight) hours as needed for severe pain. 30 tablet 0  . PARoxetine (PAXIL) 20 MG tablet TAKE 1 TABLET BY MOUTH EVERY DAY 90 tablet 1  . traMADol (ULTRAM) 50 MG tablet TAKE 1 TABLET BY MOUTH TWICE A DAY AS NEEDED (Patient taking differently: Take 50 mg by mouth 2 (two) times daily as needed for moderate pain. ) 60 tablet 3  . VENTOLIN HFA 108 (90 Base) MCG/ACT inhaler Inhale 1-2 puffs into the lungs every 6 (six) hours as needed for wheezing or shortness of breath. (Patient taking differently: Inhale 2 puffs into the lungs every 6 (six) hours as needed for wheezing or shortness of breath. ) 1 Inhaler 3   No current facility-administered medications for this visit.     PHYSICAL EXAMINATION: ECOG PERFORMANCE STATUS: 1 - Symptomatic but completely ambulatory   There were no vitals filed for this visit. There were no vitals filed for this visit.  GENERAL: alert, no distress and comfortable SKIN: skin color, texture, turgor are normal, no rashes or significant lesions EYES: normal, Conjunctiva are pink and non-injected, sclera clear OROPHARYNX: no exudate, no erythema and lips, buccal mucosa, and tongue normal  NECK: supple, thyroid normal size, non-tender, without nodularity LYMPH: no palpable lymphadenopathy in the cervical, axillary or inguinal LUNGS: clear to auscultation and percussion with normal breathing effort HEART: regular rate & rhythm and no murmurs and no lower extremity edema ABDOMEN: abdomen soft, non-tender and normal bowel sounds MUSCULOSKELETAL: no cyanosis of digits and no clubbing  NEURO: alert & oriented x 3 with fluent speech, no focal motor/sensory deficits EXTREMITIES: No lower extremity edema  LABORATORY DATA:  I have reviewed the data as listed CMP Latest Ref Rng & Units 06/14/2019 05/24/2019 04/30/2019  Glucose 70 - 99 mg/dL 104(H) 77 83  BUN 6 - 20 mg/dL 16 19 -  Creatinine 0.44 - 1.00 mg/dL 0.79 0.80 -  Sodium 135 - 145 mmol/L 139 142 140  Potassium 3.5 - 5.1 mmol/L 3.8 4.0 3.3(L)  Chloride 98 - 111 mmol/L 102 106 -  CO2 22 - 32 mmol/L 25 26 -  Calcium 8.9 - 10.3 mg/dL 9.2 8.8(L) -  Total Protein 6.5 - 8.1 g/dL 7.0 6.3(L) -  Total Bilirubin 0.3 - 1.2 mg/dL 0.3 0.2(L) -  Alkaline Phos 38 - 126 U/L 91 93 -  AST 15 - 41 U/L 33 19 -  ALT 0 - 44 U/L 41 24 -    Lab Results  Component Value Date   WBC 3.7 (L) 07/05/2019   HGB 13.2 07/05/2019   HCT 39.6 07/05/2019   MCV 96.1 07/05/2019   PLT 237 07/05/2019   NEUTROABS 2.3 07/05/2019    ASSESSMENT & PLAN:  Malignant neoplasm of upper-outer quadrant of right breast in female, estrogen receptor positive ([place]) 11/21/2018:Screening detected right breast mass upper outer quadrant, in addition to areas of calcifications which were not biopsied. By ultrasound she  had 2 breast masses 12 o'clock position 2.9 cm: Grade 2 IDC with high-grade DCIS ER 100%, PR 70%, Ki-67 10%, HER-2 3+ by IHC and 11 o'clock position 1 cm, ER 90%, PR 50%, Ki-67 15%, HER-2 3+ by IHC, T2N0 stage Ib  Neoadjuvant chemotherapy with [facility] x6 cycles 12/12/2018-03/27/2019  04/30/2019:Right mastectomy: Grade 2 IDC, 3.9 cm, high-grade DCIS, margins are negative, 0/2 lymph nodes negative, ER 95%, PR 80%, HER-2 2+ equivocal, T2 N0  Treatment plan: 1.Adjuvant Kadcyla 2.Adjuvant antiestrogen therapy with anastrozole 1 mg p.o. daily x7 years started 05/24/2019 ----------------------------------------------------------------------------------------------------------------------------- Current treatment: Cycle 3 Kadcyla maintenance started 05/24/2019 Labs reviewed Kadcyla toxicities: Loss of appetite Fatigue  I reduce the dosage of Kadcyla.  Echocardiogram will be done every 3 months No adverse effects to anastrozole therapy.  Return to clinic in 3 weeks for Kadcyla treatment and every 6 weeks for follow-up with me.    No orders of the defined types were placed in this encounter.  The patient has a good understanding of the overall plan. she agrees with it. she will call with any problems that may develop before the next visit here.  Nicholas Lose, MD 07/05/2019  Julious Oka Dorshimer am acting as scribe for Dr. Nicholas Lose.  I have reviewed the above documentation for accuracy and completeness, and I agree with the above.

## 2019-07-05 ENCOUNTER — Inpatient Hospital Stay: Payer: Medicare HMO

## 2019-07-05 ENCOUNTER — Encounter: Payer: Self-pay | Admitting: *Deleted

## 2019-07-05 ENCOUNTER — Other Ambulatory Visit: Payer: Self-pay

## 2019-07-05 ENCOUNTER — Inpatient Hospital Stay (HOSPITAL_BASED_OUTPATIENT_CLINIC_OR_DEPARTMENT_OTHER): Payer: Medicare HMO | Admitting: Hematology and Oncology

## 2019-07-05 ENCOUNTER — Inpatient Hospital Stay: Payer: Medicare HMO | Attending: Hematology and Oncology

## 2019-07-05 VITALS — BP 130/76 | HR 67

## 2019-07-05 DIAGNOSIS — Z79899 Other long term (current) drug therapy: Secondary | ICD-10-CM | POA: Insufficient documentation

## 2019-07-05 DIAGNOSIS — C50411 Malignant neoplasm of upper-outer quadrant of right female breast: Secondary | ICD-10-CM

## 2019-07-05 DIAGNOSIS — Z791 Long term (current) use of non-steroidal anti-inflammatories (NSAID): Secondary | ICD-10-CM | POA: Insufficient documentation

## 2019-07-05 DIAGNOSIS — Z170 Estrogen receptor positive status [ER+]: Secondary | ICD-10-CM

## 2019-07-05 DIAGNOSIS — Z7982 Long term (current) use of aspirin: Secondary | ICD-10-CM | POA: Diagnosis not present

## 2019-07-05 DIAGNOSIS — Z95828 Presence of other vascular implants and grafts: Secondary | ICD-10-CM

## 2019-07-05 DIAGNOSIS — R630 Anorexia: Secondary | ICD-10-CM | POA: Insufficient documentation

## 2019-07-05 DIAGNOSIS — Z79811 Long term (current) use of aromatase inhibitors: Secondary | ICD-10-CM | POA: Insufficient documentation

## 2019-07-05 DIAGNOSIS — Z5112 Encounter for antineoplastic immunotherapy: Secondary | ICD-10-CM | POA: Insufficient documentation

## 2019-07-05 DIAGNOSIS — Z9011 Acquired absence of right breast and nipple: Secondary | ICD-10-CM | POA: Insufficient documentation

## 2019-07-05 LAB — CBC WITH DIFFERENTIAL (CANCER CENTER ONLY)
Abs Immature Granulocytes: 0.01 10*3/uL (ref 0.00–0.07)
Basophils Absolute: 0 10*3/uL (ref 0.0–0.1)
Basophils Relative: 1 %
Eosinophils Absolute: 0.1 10*3/uL (ref 0.0–0.5)
Eosinophils Relative: 3 %
HCT: 39.6 % (ref 36.0–46.0)
Hemoglobin: 13.2 g/dL (ref 12.0–15.0)
Immature Granulocytes: 0 %
Lymphocytes Relative: 21 %
Lymphs Abs: 0.8 10*3/uL (ref 0.7–4.0)
MCH: 32 pg (ref 26.0–34.0)
MCHC: 33.3 g/dL (ref 30.0–36.0)
MCV: 96.1 fL (ref 80.0–100.0)
Monocytes Absolute: 0.4 10*3/uL (ref 0.1–1.0)
Monocytes Relative: 12 %
Neutro Abs: 2.3 10*3/uL (ref 1.7–7.7)
Neutrophils Relative %: 63 %
Platelet Count: 237 10*3/uL (ref 150–400)
RBC: 4.12 MIL/uL (ref 3.87–5.11)
RDW: 13.7 % (ref 11.5–15.5)
WBC Count: 3.7 10*3/uL — ABNORMAL LOW (ref 4.0–10.5)
nRBC: 0 % (ref 0.0–0.2)

## 2019-07-05 LAB — CMP (CANCER CENTER ONLY)
ALT: 35 U/L (ref 0–44)
AST: 36 U/L (ref 15–41)
Albumin: 4.1 g/dL (ref 3.5–5.0)
Alkaline Phosphatase: 87 U/L (ref 38–126)
Anion gap: 8 (ref 5–15)
BUN: 21 mg/dL — ABNORMAL HIGH (ref 6–20)
CO2: 29 mmol/L (ref 22–32)
Calcium: 8.9 mg/dL (ref 8.9–10.3)
Chloride: 103 mmol/L (ref 98–111)
Creatinine: 0.83 mg/dL (ref 0.44–1.00)
GFR, Est AFR Am: >60 mL/min (ref >60)
GFR, Estimated: >60 mL/min (ref >60)
Glucose, Bld: 99 mg/dL (ref 70–99)
Potassium: 3.3 mmol/L — ABNORMAL LOW (ref 3.5–5.1)
Sodium: 140 mmol/L (ref 135–145)
Total Bilirubin: 0.3 mg/dL (ref 0.3–1.2)
Total Protein: 6.7 g/dL (ref 6.5–8.1)

## 2019-07-05 MED ORDER — HEPARIN SOD (PORK) LOCK FLUSH 100 UNIT/ML IV SOLN
500.0000 [IU] | Freq: Once | INTRAVENOUS | Status: AC | PRN
  Administered 2019-07-05: 500 [IU]
  Filled 2019-07-05: qty 5

## 2019-07-05 MED ORDER — SODIUM CHLORIDE 0.9% FLUSH
10.0000 mL | Freq: Once | INTRAVENOUS | Status: AC
  Administered 2019-07-05: 09:00:00 10 mL
  Filled 2019-07-05: qty 10

## 2019-07-05 MED ORDER — DIPHENHYDRAMINE HCL 25 MG PO CAPS
ORAL_CAPSULE | ORAL | Status: AC
  Filled 2019-07-05: qty 2

## 2019-07-05 MED ORDER — SODIUM CHLORIDE 0.9% FLUSH
10.0000 mL | INTRAVENOUS | Status: DC | PRN
  Administered 2019-07-05: 10 mL
  Filled 2019-07-05: qty 10

## 2019-07-05 MED ORDER — ACETAMINOPHEN 325 MG PO TABS
ORAL_TABLET | ORAL | Status: AC
  Filled 2019-07-05: qty 2

## 2019-07-05 MED ORDER — ACETAMINOPHEN 325 MG PO TABS
650.0000 mg | ORAL_TABLET | Freq: Once | ORAL | Status: AC
  Administered 2019-07-05: 650 mg via ORAL

## 2019-07-05 MED ORDER — DIPHENHYDRAMINE HCL 25 MG PO CAPS
50.0000 mg | ORAL_CAPSULE | Freq: Once | ORAL | Status: AC
  Administered 2019-07-05: 50 mg via ORAL

## 2019-07-05 MED ORDER — SODIUM CHLORIDE 0.9 % IV SOLN
3.0000 mg/kg | Freq: Once | INTRAVENOUS | Status: AC
  Administered 2019-07-05: 160 mg via INTRAVENOUS
  Filled 2019-07-05: qty 8

## 2019-07-05 MED ORDER — SODIUM CHLORIDE 0.9 % IV SOLN
Freq: Once | INTRAVENOUS | Status: AC
  Administered 2019-07-05: 10:00:00 via INTRAVENOUS
  Filled 2019-07-05: qty 250

## 2019-07-05 NOTE — Assessment & Plan Note (Signed)
11/21/2018:Screening detected right breast mass upper outer quadrant, in addition to areas of calcifications which were not biopsied. By ultrasound she had 2 breast masses 12 o'clock position 2.9 cm: Grade 2 IDC with high-grade DCIS ER 100%, PR 70%, Ki-67 10%, HER-2 3+ by IHC and 11 o'clock position 1 cm, ER 90%, PR 50%, Ki-67 15%, HER-2 3+ by IHC, T2N0 stage Ib  Neoadjuvant chemotherapy with [facility] x6 cycles 12/12/2018-03/27/2019  04/30/2019:Right mastectomy: Grade 2 IDC, 3.9 cm, high-grade DCIS, margins are negative, 0/2 lymph nodes negative, ER 95%, PR 80%, HER-2 2+ equivocal, T2 N0  Treatment plan: 1.Adjuvant Kadcyla 2.Adjuvant antiestrogen therapy with anastrozole 1 mg p.o. daily x7 years started 05/24/2019 ----------------------------------------------------------------------------------------------------------------------------- Current treatment: Cycle 3 Kadcyla maintenance started 05/24/2019 Labs reviewed Kadcyla toxicities: Patient has not been experiencing any major side effects to Kadcyla. Echocardiogram will be done every 3 months No adverse effects to anastrozole therapy.  Return to clinic in 3 weeks for Kadcyla treatment and every 6 weeks for follow-up with me.

## 2019-07-05 NOTE — Patient Instructions (Signed)
Cancer Center Discharge Instructions for Patients Receiving Chemotherapy  Today you received the following chemotherapy agents :  Kadcyla.  To help prevent nausea and vomiting after your treatment, we encourage you to take your nausea medication as prescribed.   If you develop nausea and vomiting that is not controlled by your nausea medication, call the clinic.   BELOW ARE SYMPTOMS THAT SHOULD BE REPORTED IMMEDIATELY:  *FEVER GREATER THAN 100.5 F  *CHILLS WITH OR WITHOUT FEVER  NAUSEA AND VOMITING THAT IS NOT CONTROLLED WITH YOUR NAUSEA MEDICATION  *UNUSUAL SHORTNESS OF BREATH  *UNUSUAL BRUISING OR BLEEDING  TENDERNESS IN MOUTH AND THROAT WITH OR WITHOUT PRESENCE OF ULCERS  *URINARY PROBLEMS  *BOWEL PROBLEMS  UNUSUAL RASH Items with * indicate a potential emergency and should be followed up as soon as possible.  Feel free to call the clinic should you have any questions or concerns. The clinic phone number is [phone].  Please show the CHEMO ALERT CARD at check-in to the Emergency Department and triage nurse.   

## 2019-07-11 ENCOUNTER — Other Ambulatory Visit: Payer: Self-pay | Admitting: *Deleted

## 2019-07-11 ENCOUNTER — Telehealth: Payer: Self-pay | Admitting: *Deleted

## 2019-07-11 DIAGNOSIS — G35 Multiple sclerosis: Secondary | ICD-10-CM

## 2019-07-11 MED ORDER — TECFIDERA 240 MG PO CPDR: DELAYED_RELEASE_CAPSULE | ORAL | 11 refills | Status: DC

## 2019-07-11 NOTE — Telephone Encounter (Signed)
Debbie Watts, can you please call patient and schedule her a follow up around 09/20/19? Thank you!  She does not have one scheduled after her VV 03/20/19

## 2019-07-11 NOTE — Telephone Encounter (Signed)
Appointment has been scheduled for the week prior to Thanksgiving on 11/18.

## 2019-07-20 ENCOUNTER — Encounter: Payer: Self-pay | Admitting: *Deleted

## 2019-07-25 NOTE — Progress Notes (Signed)
Patient Care Team: Neva Seat, MD as PCP - General (Internal Medicine) Excell Seltzer, MD as Consulting Physician (General Surgery) Nicholas Lose, MD as Consulting Physician (Hematology and Oncology) Kyung Rudd, MD as Consulting Physician (Radiation Oncology)  DIAGNOSIS:    ICD-10-CM   1. Malignant neoplasm of upper-outer quadrant of right breast in female, estrogen receptor positive ([place])  C50.411    Z17.0     SUMMARY OF ONCOLOGIC HISTORY: Oncology History  Malignant neoplasm of upper-outer quadrant of right breast in female, estrogen receptor positive ([place])  11/21/2018 Initial Diagnosis   Screening detected right breast mass upper outer quadrant, in addition to areas of calcifications which were not biopsied.  By ultrasound she had 2 breast masses 12 o'clock position 2.9 cm: Grade 2 IDC with high-grade DCIS ER 100%, PR 70%, Ki-67 10%, HER-2 3+ by IHC and 11 o'clock position 1 cm, ER 90%, PR 50%, Ki-67 15%, HER-2 3+ by IHC, T2N0 stage Ib   11/29/2018 Cancer Staging   Staging form: Breast, AJCC 8th Edition - Clinical stage from 11/29/2018: Stage IB (cT2, cN0, cM0, G3, ER+, PR+, HER2+) - Signed by Nicholas Lose, MD on 11/29/2018   12/12/2018 -  Neo-Adjuvant Chemotherapy   Neoadjuvant chemotherapy with [facility]   01/18/2019 - 01/20/2019 Hospital Admission   Syncope   04/30/2019 Surgery   Right mastectomy: Grade 2 IDC, 3.9 cm, high-grade DCIS, margins are negative, 0/2 lymph nodes negative, ER 95%, PR 80%, HER-2 2+ equivocal, T2 N0   04/30/2019 Cancer Staging   Staging form: Breast, AJCC 8th Edition - Pathologic stage from 04/30/2019: No Stage Recommended (ypT2, pN0, cM0, G2, ER+, PR+, HER2-) - Signed by Gardenia Phlegm, NP on 05/09/2019   05/24/2019 -  Chemotherapy   The patient had ado-trastuzumab emtansine (KADCYLA) 200 mg in sodium chloride 0.9 % 250 mL chemo infusion, 240 mg, Intravenous, Once, 3 of 8 cycles Dose modification: 3 mg/kg (original dose 3.6 mg/kg, Cycle  5, Reason: Dose not tolerated) Administration: 200 mg (05/24/2019), 200 mg (06/14/2019), 160 mg (07/05/2019)  for chemotherapy treatment.    05/24/2019 -  Anti-estrogen oral therapy   Anastrozole 1 mg daily     CHIEF COMPLIANT: Follow-up of Kadcyla maintenance  INTERVAL HISTORY: Debbie Watts is a 58 y.o. with above-mentioned history of right breast cancer who completed neoadjuvant chemotherapy with [facility]CHP and underwent a right mastectomy.Sheis currently on adjuvant treatment withKadcyla maintenanceand anti-estrogen therapy with anastrozole.She presents to the clinic todayfor treatment.   REVIEW OF SYSTEMS:   Constitutional: Denies fevers, chills or abnormal weight loss Eyes: Denies blurriness of vision Ears, nose, mouth, throat, and face: Denies mucositis or sore throat Respiratory: Denies cough, dyspnea or wheezes Cardiovascular: Denies palpitation, chest discomfort Gastrointestinal: Denies nausea, heartburn or change in bowel habits Skin: Denies abnormal skin rashes Lymphatics: Denies new lymphadenopathy or easy bruising Neurological: Denies numbness, tingling or new weaknesses Behavioral/Psych: Mood is stable, no new changes  Extremities: No lower extremity edema Breast: denies any pain or lumps or nodules in either breasts All other systems were reviewed with the patient and are negative.  I have reviewed the past medical history, past surgical history, social history and family history with the patient and they are unchanged from previous note.  ALLERGIES:  is allergic to procaine.  MEDICATIONS:  Current Outpatient Medications  Medication Sig Dispense Refill  . anastrozole (ARIMIDEX) 1 MG tablet Take 1 tablet (1 mg total) by mouth daily. 90 tablet 3  . aspirin-acetaminophen-caffeine (EXCEDRIN MIGRAINE) 250-250-65 MG tablet Take 2  tablets by mouth every 6 (six) hours as needed for migraine.    . betamethasone valerate lotion (VALISONE) 0.1 % Apply 1 application topically  daily as needed for irritation.     . carbidopa-levodopa (SINEMET IR) 10-100 MG tablet Take 1 tablet by mouth at bedtime. 90 tablet 3  . diclofenac (VOLTAREN) 75 MG EC tablet Take 1 tablet (75 mg total) by mouth 2 (two) times daily. 60 tablet 5  . diphenhydrAMINE (BENADRYL) 25 MG tablet Take 25 mg by mouth daily.    . fluticasone (FLONASE) 50 MCG/ACT nasal spray Place 2 sprays into both nostrils at bedtime.    . gabapentin (NEURONTIN) 300 MG capsule Take 2 capsules (600 mg total) by mouth 2 (two) times daily. 360 capsule 3  . lisinopril (ZESTRIL) 5 MG tablet TAKE 1 TABLET BY MOUTH EVERY DAY 90 tablet 1  . methocarbamol (ROBAXIN) 500 MG tablet Take 1 tablet (500 mg total) by mouth every 8 (eight) hours as needed for muscle spasms. 20 tablet 1  . Multiple Vitamin (MULTIVITAMIN WITH MINERALS) TABS tablet Take 2 tablets by mouth daily.    Marland Kitchen omeprazole (PRILOSEC OTC) 20 MG tablet Take 20 mg by mouth daily as needed (acid reflux).    Marland Kitchen oxyCODONE-acetaminophen (PERCOCET/ROXICET) 5-325 MG tablet Take 1 tablet by mouth every 8 (eight) hours as needed for severe pain. 30 tablet 0  . PARoxetine (PAXIL) 20 MG tablet TAKE 1 TABLET BY MOUTH EVERY DAY 90 tablet 1  . TECFIDERA 240 MG CPDR Take 1 capsule by mouth twice daily 60 capsule 11  . traMADol (ULTRAM) 50 MG tablet TAKE 1 TABLET BY MOUTH TWICE A DAY AS NEEDED (Patient taking differently: Take 50 mg by mouth 2 (two) times daily as needed for moderate pain. ) 60 tablet 3  . VENTOLIN HFA 108 (90 Base) MCG/ACT inhaler Inhale 1-2 puffs into the lungs every 6 (six) hours as needed for wheezing or shortness of breath. (Patient taking differently: Inhale 2 puffs into the lungs every 6 (six) hours as needed for wheezing or shortness of breath. ) 1 Inhaler 3   No current facility-administered medications for this visit.     PHYSICAL EXAMINATION: ECOG PERFORMANCE STATUS: 1 - Symptomatic but completely ambulatory  Vitals:   07/26/19 0849  BP: 134/75  Pulse: 69   Resp: 18  Temp: 98 F (36.7 C)  SpO2: 100%   Filed Weights   07/26/19 0849  Weight: 120 lb 11.2 oz (54.7 kg)    GENERAL: alert, no distress and comfortable SKIN: skin color, texture, turgor are normal, no rashes or significant lesions EYES: normal, Conjunctiva are pink and non-injected, sclera clear OROPHARYNX: no exudate, no erythema and lips, buccal mucosa, and tongue normal  NECK: supple, thyroid normal size, non-tender, without nodularity LYMPH: no palpable lymphadenopathy in the cervical, axillary or inguinal LUNGS: clear to auscultation and percussion with normal breathing effort HEART: regular rate & rhythm and no murmurs and no lower extremity edema ABDOMEN: abdomen soft, non-tender and normal bowel sounds MUSCULOSKELETAL: no cyanosis of digits and no clubbing  NEURO: alert & oriented x 3 with fluent speech, no focal motor/sensory deficits EXTREMITIES: No lower extremity edema  LABORATORY DATA:  I have reviewed the data as listed CMP Latest Ref Rng & Units 07/05/2019 06/14/2019 05/24/2019  Glucose 70 - 99 mg/dL 99 104(H) 77  BUN 6 - 20 mg/dL 21(H) 16 19  Creatinine 0.44 - 1.00 mg/dL 0.83 0.79 0.80  Sodium 135 - 145 mmol/L 140 139 142  Potassium  3.5 - 5.1 mmol/L 3.3(L) 3.8 4.0  Chloride 98 - 111 mmol/L 103 102 106  CO2 22 - 32 mmol/L '29 25 26  ' Calcium 8.9 - 10.3 mg/dL 8.9 9.2 8.8(L)  Total Protein 6.5 - 8.1 g/dL 6.7 7.0 6.3(L)  Total Bilirubin 0.3 - 1.2 mg/dL 0.3 0.3 0.2(L)  Alkaline Phos 38 - 126 U/L 87 91 93  AST 15 - 41 U/L 36 33 19  ALT 0 - 44 U/L 35 41 24    Lab Results  Component Value Date   WBC 2.8 (L) 07/26/2019   HGB 12.3 07/26/2019   HCT 36.9 07/26/2019   MCV 94.9 07/26/2019   PLT 208 07/26/2019   NEUTROABS 1.8 07/26/2019    ASSESSMENT & PLAN:  Malignant neoplasm of upper-outer quadrant of right breast in female, estrogen receptor positive ([place]) 11/21/2018:Screening detected right breast mass upper outer quadrant, in addition to areas of  calcifications which were not biopsied. By ultrasound she had 2 breast masses 12 o'clock position 2.9 cm: Grade 2 IDC with high-grade DCIS ER 100%, PR 70%, Ki-67 10%, HER-2 3+ by IHC and 11 o'clock position 1 cm, ER 90%, PR 50%, Ki-67 15%, HER-2 3+ by IHC, T2N0 stage Ib  Neoadjuvant chemotherapy with [facility] x6 cycles 12/12/2018-03/27/2019  04/30/2019:Right mastectomy: Grade 2 IDC, 3.9 cm, high-grade DCIS, margins are negative, 0/2 lymph nodes negative, ER 95%, PR 80%, HER-2 2+ equivocal, T2 N0  Treatment plan: 1.Adjuvant Kadcyla started 05/24/2019 2.Adjuvant antiestrogen therapy with anastrozole 1 mg p.o. daily x7 yearsstarted 05/24/2019 ----------------------------------------------------------------------------------------------------------------------------- Current treatment: Cycle 4 Kadcyla maintenance (last Kadcyla to be given 11/29/2019) Labs reviewed Kadcyla toxicities:  1. Loss of appetite 2. Fatigue 3.  Leukopenia probably from prior chemotherapy continue to monitor  I reduced the dosage of Kadcyla previously.  Echocardiogram will be done every 3 months No adverse effects to anastrozole therapy.  Return to clinic in 3 weeks forKadcyla treatment  No orders of the defined types were placed in this encounter.  The patient has a good understanding of the overall plan. she agrees with it. she will call with any problems that may develop before the next visit here.  Nicholas Lose, MD 07/26/2019  Julious Oka Dorshimer am acting as scribe for Dr. Nicholas Lose.  I have reviewed the above documentation for accuracy and completeness, and I agree with the above.

## 2019-07-26 ENCOUNTER — Inpatient Hospital Stay: Payer: Medicare HMO

## 2019-07-26 ENCOUNTER — Inpatient Hospital Stay: Payer: Medicare HMO | Attending: Hematology and Oncology

## 2019-07-26 ENCOUNTER — Telehealth: Payer: Self-pay | Admitting: Hematology and Oncology

## 2019-07-26 ENCOUNTER — Other Ambulatory Visit: Payer: Self-pay

## 2019-07-26 ENCOUNTER — Inpatient Hospital Stay (HOSPITAL_BASED_OUTPATIENT_CLINIC_OR_DEPARTMENT_OTHER): Payer: Medicare HMO | Admitting: Hematology and Oncology

## 2019-07-26 DIAGNOSIS — Z170 Estrogen receptor positive status [ER+]: Secondary | ICD-10-CM

## 2019-07-26 DIAGNOSIS — Z95828 Presence of other vascular implants and grafts: Secondary | ICD-10-CM

## 2019-07-26 DIAGNOSIS — C50411 Malignant neoplasm of upper-outer quadrant of right female breast: Secondary | ICD-10-CM

## 2019-07-26 DIAGNOSIS — Z79899 Other long term (current) drug therapy: Secondary | ICD-10-CM | POA: Insufficient documentation

## 2019-07-26 DIAGNOSIS — Z5112 Encounter for antineoplastic immunotherapy: Secondary | ICD-10-CM | POA: Insufficient documentation

## 2019-07-26 DIAGNOSIS — Z7982 Long term (current) use of aspirin: Secondary | ICD-10-CM | POA: Insufficient documentation

## 2019-07-26 DIAGNOSIS — Z79811 Long term (current) use of aromatase inhibitors: Secondary | ICD-10-CM | POA: Insufficient documentation

## 2019-07-26 DIAGNOSIS — Z791 Long term (current) use of non-steroidal anti-inflammatories (NSAID): Secondary | ICD-10-CM | POA: Insufficient documentation

## 2019-07-26 DIAGNOSIS — Z9011 Acquired absence of right breast and nipple: Secondary | ICD-10-CM | POA: Diagnosis not present

## 2019-07-26 LAB — CBC WITH DIFFERENTIAL (CANCER CENTER ONLY)
Abs Immature Granulocytes: 0.01 10*3/uL (ref 0.00–0.07)
Basophils Absolute: 0 10*3/uL (ref 0.0–0.1)
Basophils Relative: 1 %
Eosinophils Absolute: 0.1 10*3/uL (ref 0.0–0.5)
Eosinophils Relative: 2 %
HCT: 36.9 % (ref 36.0–46.0)
Hemoglobin: 12.3 g/dL (ref 12.0–15.0)
Immature Granulocytes: 0 %
Lymphocytes Relative: 20 %
Lymphs Abs: 0.6 10*3/uL — ABNORMAL LOW (ref 0.7–4.0)
MCH: 31.6 pg (ref 26.0–34.0)
MCHC: 33.3 g/dL (ref 30.0–36.0)
MCV: 94.9 fL (ref 80.0–100.0)
Monocytes Absolute: 0.4 10*3/uL (ref 0.1–1.0)
Monocytes Relative: 13 %
Neutro Abs: 1.8 10*3/uL (ref 1.7–7.7)
Neutrophils Relative %: 64 %
Platelet Count: 208 10*3/uL (ref 150–400)
RBC: 3.89 MIL/uL (ref 3.87–5.11)
RDW: 13.4 % (ref 11.5–15.5)
WBC Count: 2.8 10*3/uL — ABNORMAL LOW (ref 4.0–10.5)
nRBC: 0 % (ref 0.0–0.2)

## 2019-07-26 LAB — CMP (CANCER CENTER ONLY)
ALT: 27 U/L (ref 0–44)
AST: 25 U/L (ref 15–41)
Albumin: 3.8 g/dL (ref 3.5–5.0)
Alkaline Phosphatase: 73 U/L (ref 38–126)
Anion gap: 9 (ref 5–15)
BUN: 18 mg/dL (ref 6–20)
CO2: 26 mmol/L (ref 22–32)
Calcium: 8.3 mg/dL — ABNORMAL LOW (ref 8.9–10.3)
Chloride: 105 mmol/L (ref 98–111)
Creatinine: 0.76 mg/dL (ref 0.44–1.00)
GFR, Est AFR Am: >60 mL/min (ref >60)
GFR, Estimated: >60 mL/min (ref >60)
Glucose, Bld: 92 mg/dL (ref 70–99)
Potassium: 3.8 mmol/L (ref 3.5–5.1)
Sodium: 140 mmol/L (ref 135–145)
Total Bilirubin: 0.2 mg/dL — ABNORMAL LOW (ref 0.3–1.2)
Total Protein: 6.2 g/dL — ABNORMAL LOW (ref 6.5–8.1)

## 2019-07-26 MED ORDER — OXYCODONE-ACETAMINOPHEN 5-325 MG PO TABS: 1.0000 | ORAL_TABLET | Freq: Three times a day (TID) | ORAL | 0 refills | Status: DC | PRN

## 2019-07-26 MED ORDER — SODIUM CHLORIDE 0.9 % IV SOLN
Freq: Once | INTRAVENOUS | Status: AC
  Administered 2019-07-26: 10:00:00 via INTRAVENOUS
  Filled 2019-07-26: qty 250

## 2019-07-26 MED ORDER — SODIUM CHLORIDE 0.9 % IV SOLN
3.0000 mg/kg | Freq: Once | INTRAVENOUS | Status: AC
  Administered 2019-07-26: 10:00:00 160 mg via INTRAVENOUS
  Filled 2019-07-26: qty 8

## 2019-07-26 MED ORDER — DIPHENHYDRAMINE HCL 25 MG PO CAPS
50.0000 mg | ORAL_CAPSULE | Freq: Once | ORAL | Status: AC
  Administered 2019-07-26: 10:00:00 50 mg via ORAL

## 2019-07-26 MED ORDER — DIPHENHYDRAMINE HCL 25 MG PO CAPS
ORAL_CAPSULE | ORAL | Status: AC
  Filled 2019-07-26: qty 2

## 2019-07-26 MED ORDER — SODIUM CHLORIDE 0.9% FLUSH
10.0000 mL | Freq: Once | INTRAVENOUS | Status: AC
  Administered 2019-07-26: 10 mL
  Filled 2019-07-26: qty 10

## 2019-07-26 MED ORDER — ACETAMINOPHEN 325 MG PO TABS
650.0000 mg | ORAL_TABLET | Freq: Once | ORAL | Status: AC
  Administered 2019-07-26: 10:00:00 650 mg via ORAL

## 2019-07-26 MED ORDER — HEPARIN SOD (PORK) LOCK FLUSH 100 UNIT/ML IV SOLN
500.0000 [IU] | Freq: Once | INTRAVENOUS | Status: AC | PRN
  Administered 2019-07-26: 500 [IU]
  Filled 2019-07-26: qty 5

## 2019-07-26 MED ORDER — SODIUM CHLORIDE 0.9% FLUSH
10.0000 mL | INTRAVENOUS | Status: DC | PRN
  Administered 2019-07-26: 11:00:00 10 mL
  Filled 2019-07-26: qty 10

## 2019-07-26 MED ORDER — ACETAMINOPHEN 325 MG PO TABS
ORAL_TABLET | ORAL | Status: AC
  Filled 2019-07-26: qty 2

## 2019-07-26 NOTE — Assessment & Plan Note (Signed)
11/21/2018:Screening detected right breast mass upper outer quadrant, in addition to areas of calcifications which were not biopsied. By ultrasound she had 2 breast masses 12 o'clock position 2.9 cm: Grade 2 IDC with high-grade DCIS ER 100%, PR 70%, Ki-67 10%, HER-2 3+ by IHC and 11 o'clock position 1 cm, ER 90%, PR 50%, Ki-67 15%, HER-2 3+ by IHC, T2N0 stage Ib  Neoadjuvant chemotherapy with [facility] x6 cycles 12/12/2018-03/27/2019  04/30/2019:Right mastectomy: Grade 2 IDC, 3.9 cm, high-grade DCIS, margins are negative, 0/2 lymph nodes negative, ER 95%, PR 80%, HER-2 2+ equivocal, T2 N0  Treatment plan: 1.Adjuvant Kadcyla started 05/24/2019 2.Adjuvant antiestrogen therapy with anastrozole 1 mg p.o. daily x7 yearsstarted 05/24/2019 ----------------------------------------------------------------------------------------------------------------------------- Current treatment: Cycle 4 Kadcyla maintenance Labs reviewed Kadcyla toxicities: Loss of appetite Fatigue  I reduce the dosage of Kadcyla.  Echocardiogram will be done every 3 months No adverse effects to anastrozole therapy.  Return to clinic in 3 weeks forKadcyla treatment and every 6 weeks for follow-up with me.

## 2019-07-26 NOTE — Telephone Encounter (Signed)
I talk with patient regarding schedule  

## 2019-07-26 NOTE — Patient Instructions (Signed)
[facility] Discharge Instructions for Patients Receiving Chemotherapy  Today you received the following chemotherapy agents Kadcyla  To help prevent nausea and vomiting after your treatment, we encourage you to take your nausea medication as directed   If you develop nausea and vomiting that is not controlled by your nausea medication, call the clinic.   BELOW ARE SYMPTOMS THAT SHOULD BE REPORTED IMMEDIATELY:  *FEVER GREATER THAN 100.5 F  *CHILLS WITH OR WITHOUT FEVER  NAUSEA AND VOMITING THAT IS NOT CONTROLLED WITH YOUR NAUSEA MEDICATION  *UNUSUAL SHORTNESS OF BREATH  *UNUSUAL BRUISING OR BLEEDING  TENDERNESS IN MOUTH AND THROAT WITH OR WITHOUT PRESENCE OF ULCERS  *URINARY PROBLEMS  *BOWEL PROBLEMS  UNUSUAL RASH Items with * indicate a potential emergency and should be followed up as soon as possible.  Feel free to call the clinic should you have any questions or concerns. The clinic phone number is [phone].  Please show the CHEMO ALERT CARD at check-in to the Emergency Department and triage nurse.   

## 2019-08-12 ENCOUNTER — Other Ambulatory Visit: Payer: Self-pay | Admitting: Neurology

## 2019-08-13 ENCOUNTER — Other Ambulatory Visit: Payer: Self-pay | Admitting: Neurology

## 2019-08-13 MED ORDER — TRAMADOL HCL 50 MG PO TABS: 50.0000 mg | ORAL_TABLET | Freq: Two times a day (BID) | ORAL | 3 refills | Status: DC | PRN

## 2019-08-13 NOTE — Addendum Note (Signed)
Addended by: Hope Pigeon on: 08/13/2019 04:09 PM   Modules accepted: Orders

## 2019-08-13 NOTE — Telephone Encounter (Signed)
Phone rep checked office voicemail;pt is asking for a refill on her traMADol (ULTRAM) 50 MG tablet [facility] #[id number] this message was left at 2:48 pm

## 2019-08-15 NOTE — Progress Notes (Signed)
Patient Care Team: Neva Seat, MD as PCP - General (Internal Medicine) Excell Seltzer, MD as Consulting Physician (General Surgery) Nicholas Lose, MD as Consulting Physician (Hematology and Oncology) Kyung Rudd, MD as Consulting Physician (Radiation Oncology)  DIAGNOSIS:    ICD-10-CM   1. Malignant neoplasm of upper-outer quadrant of right breast in female, estrogen receptor positive ([place])  C50.411    Z17.0     SUMMARY OF ONCOLOGIC HISTORY: Oncology History  Malignant neoplasm of upper-outer quadrant of right breast in female, estrogen receptor positive ([place])  11/21/2018 Initial Diagnosis   Screening detected right breast mass upper outer quadrant, in addition to areas of calcifications which were not biopsied.  By ultrasound she had 2 breast masses 12 o'clock position 2.9 cm: Grade 2 IDC with high-grade DCIS ER 100%, PR 70%, Ki-67 10%, HER-2 3+ by IHC and 11 o'clock position 1 cm, ER 90%, PR 50%, Ki-67 15%, HER-2 3+ by IHC, T2N0 stage Ib   11/29/2018 Cancer Staging   Staging form: Breast, AJCC 8th Edition - Clinical stage from 11/29/2018: Stage IB (cT2, cN0, cM0, G3, ER+, PR+, HER2+) - Signed by Nicholas Lose, MD on 11/29/2018   12/12/2018 -  Neo-Adjuvant Chemotherapy   Neoadjuvant chemotherapy with [facility]   01/18/2019 - 01/20/2019 Hospital Admission   Syncope   04/30/2019 Surgery   Right Watts: Grade 2 IDC, 3.9 cm, high-grade DCIS, margins are negative, 0/2 lymph nodes negative, ER 95%, PR 80%, HER-2 2+ equivocal, T2 N0   04/30/2019 Cancer Staging   Staging form: Breast, AJCC 8th Edition - Pathologic stage from 04/30/2019: No Stage Recommended (ypT2, pN0, cM0, G2, ER+, PR+, HER2-) - Signed by Gardenia Phlegm, NP on 05/09/2019   05/24/2019 -  Chemotherapy   The patient had ado-trastuzumab emtansine (KADCYLA) 200 mg in sodium chloride 0.9 % 250 mL chemo infusion, 240 mg, Intravenous, Once, 4 of 10 cycles Dose modification: 3 mg/kg (original dose 3.6 mg/kg, Cycle  5, Reason: Dose not tolerated) Administration: 200 mg (05/24/2019), 200 mg (06/14/2019), 160 mg (07/05/2019), 160 mg (07/26/2019)  for chemotherapy treatment.    05/24/2019 -  Anti-estrogen oral therapy   Anastrozole 1 mg daily     CHIEF COMPLIANT: Follow-up ofKadcyla maintenance  INTERVAL HISTORY: Debbie Watts is a 58 y.o. with above-mentioned history of right breast cancer who completed neoadjuvant chemotherapy with [place] and underwent a right Watts.Sheis currently on adjuvant treatment withKadcyla maintenanceand anti-estrogen therapy with anastrozole.She presents to the clinic todayfor treatment.  She has chronic pain issues related to her back for which she takes intermittent pain medication. Denies any side effects to Kadcyla or anastrozole therapy.  REVIEW OF SYSTEMS:   Constitutional: Denies fevers, chills or abnormal weight loss Eyes: Denies blurriness of vision Ears, nose, mouth, throat, and face: Denies mucositis or sore throat Respiratory: Denies cough, dyspnea or wheezes Cardiovascular: Denies palpitation, chest discomfort Gastrointestinal: Denies nausea, heartburn or change in bowel habits Skin: Denies abnormal skin rashes Lymphatics: Denies new lymphadenopathy or easy bruising Neurological: Denies numbness, tingling or new weaknesses Behavioral/Psych: Mood is stable, no new changes  Extremities: No lower extremity edema Breast: denies any pain or lumps or nodules in either breasts All other systems were reviewed with the patient and are negative.  I have reviewed the past medical history, past surgical history, social history and family history with the patient and they are unchanged from previous note.  ALLERGIES:  is allergic to procaine.  MEDICATIONS:  Current Outpatient Medications  Medication Sig Dispense Refill  anastrozole (ARIMIDEX) 1 MG tablet Take 1 tablet (1 mg total) by mouth daily. 90 tablet 3   aspirin-acetaminophen-caffeine  (EXCEDRIN MIGRAINE) 250-250-65 MG tablet Take 2 tablets by mouth every 6 (six) hours as needed for migraine.     betamethasone valerate lotion (VALISONE) 0.1 % Apply 1 application topically daily as needed for irritation.      carbidopa-levodopa (SINEMET IR) 10-100 MG tablet Take 1 tablet by mouth at bedtime. 90 tablet 3   diclofenac (VOLTAREN) 75 MG EC tablet Take 1 tablet (75 mg total) by mouth 2 (two) times daily. 60 tablet 5   diphenhydrAMINE (BENADRYL) 25 MG tablet Take 25 mg by mouth daily.     fluticasone (FLONASE) 50 MCG/ACT nasal spray Place 2 sprays into both nostrils at bedtime.     gabapentin (NEURONTIN) 300 MG capsule Take 2 capsules (600 mg total) by mouth 2 (two) times daily. 360 capsule 3   lisinopril (ZESTRIL) 5 MG tablet TAKE 1 TABLET BY MOUTH EVERY DAY 90 tablet 1   methocarbamol (ROBAXIN) 500 MG tablet Take 1 tablet (500 mg total) by mouth every 8 (eight) hours as needed for muscle spasms. 20 tablet 1   Multiple Vitamin (MULTIVITAMIN WITH MINERALS) TABS tablet Take 2 tablets by mouth daily.     omeprazole (PRILOSEC OTC) 20 MG tablet Take 20 mg by mouth daily as needed (acid reflux).     oxyCODONE-acetaminophen (PERCOCET/ROXICET) 5-325 MG tablet Take 1 tablet by mouth every 8 (eight) hours as needed for severe pain. 30 tablet 0   PARoxetine (PAXIL) 20 MG tablet TAKE 1 TABLET BY MOUTH EVERY DAY 90 tablet 1   TECFIDERA 240 MG CPDR Take 1 capsule by mouth twice daily 60 capsule 11   traMADol (ULTRAM) 50 MG tablet Take 1 tablet (50 mg total) by mouth 2 (two) times daily as needed. 60 tablet 3   VENTOLIN HFA 108 (90 Base) MCG/ACT inhaler Inhale 1-2 puffs into the lungs every 6 (six) hours as needed for wheezing or shortness of breath. (Patient taking differently: Inhale 2 puffs into the lungs every 6 (six) hours as needed for wheezing or shortness of breath. ) 1 Inhaler 3   No current facility-administered medications for this visit.     PHYSICAL EXAMINATION: ECOG  PERFORMANCE STATUS: 1 - Symptomatic but completely ambulatory  Vitals:   08/16/19 0933  BP: (!) 141/76  Pulse: 68  Resp: 17  Temp: 98 F (36.7 C)  SpO2: 99%   Filed Weights   08/16/19 0933  Weight: 121 lb 1.6 oz (54.9 kg)    GENERAL: alert, no distress and comfortable SKIN: skin color, texture, turgor are normal, no rashes or significant lesions EYES: normal, Conjunctiva are pink and non-injected, sclera clear OROPHARYNX: no exudate, no erythema and lips, buccal mucosa, and tongue normal  NECK: supple, thyroid normal size, non-tender, without nodularity LYMPH: no palpable lymphadenopathy in the cervical, axillary or inguinal LUNGS: clear to auscultation and percussion with normal breathing effort HEART: regular rate & rhythm and no murmurs and no lower extremity edema ABDOMEN: abdomen soft, non-tender and normal bowel sounds MUSCULOSKELETAL: no cyanosis of digits and no clubbing  NEURO: alert & oriented x 3 with fluent speech, no focal motor/sensory deficits EXTREMITIES: No lower extremity edema  LABORATORY DATA:  I have reviewed the data as listed CMP Latest Ref Rng & Units 07/26/2019 07/05/2019 06/14/2019  Glucose 70 - 99 mg/dL 92 99 104(H)  BUN 6 - 20 mg/dL 18 21(H) 16  Creatinine 0.44 - 1.00 mg/dL  0.76 0.83 0.79  Sodium 135 - 145 mmol/L 140 140 139  Potassium 3.5 - 5.1 mmol/L 3.8 3.3(L) 3.8  Chloride 98 - 111 mmol/L 105 103 102  CO2 22 - 32 mmol/L '26 29 25  ' Calcium 8.9 - 10.3 mg/dL 8.3(L) 8.9 9.2  Total Protein 6.5 - 8.1 g/dL 6.2(L) 6.7 7.0  Total Bilirubin 0.3 - 1.2 mg/dL 0.2(L) 0.3 0.3  Alkaline Phos 38 - 126 U/L 73 87 91  AST 15 - 41 U/L 25 36 33  ALT 0 - 44 U/L 27 35 41    Lab Results  Component Value Date   WBC 4.8 08/16/2019   HGB 12.0 08/16/2019   HCT 36.2 08/16/2019   MCV 93.1 08/16/2019   PLT 211 08/16/2019   NEUTROABS 3.7 08/16/2019    ASSESSMENT & PLAN:  Malignant neoplasm of upper-outer quadrant of right breast in female, estrogen receptor  positive ([place]) 11/21/2018:Screening detected right breast mass upper outer quadrant, in addition to areas of calcifications which were not biopsied. By ultrasound she had 2 breast masses 12 o'clock position 2.9 cm: Grade 2 IDC with high-grade DCIS ER 100%, PR 70%, Ki-67 10%, HER-2 3+ by IHC and 11 o'clock position 1 cm, ER 90%, PR 50%, Ki-67 15%, HER-2 3+ by IHC, T2N0 stage Ib  Neoadjuvant chemotherapy with [facility] x6 cycles 12/12/2018-03/27/2019  04/30/2019:Right Watts: Grade 2 IDC, 3.9 cm, high-grade DCIS, margins are negative, 0/2 lymph nodes negative, ER 95%, PR 80%, HER-2 2+ equivocal, T2 N0  Treatment plan: 1.Adjuvant Kadcyla started 05/24/2019 2.Adjuvant antiestrogen therapy with anastrozole 1 mg p.o. daily x7 yearsstarted 05/24/2019 ----------------------------------------------------------------------------------------------------------------------------- Current treatment: Cycle5Kadcyla maintenance (last Kadcyla to be given 11/29/2019) Labs reviewed Kadcyla toxicities: 1. Loss of appetite 2. Fatigue 3.  Leukopenia probably from prior chemotherapy continue to monitor  I reduced the dosage of Kadcyla previously.  Echocardiogram will be done every 3 months No adverse effects to anastrozole therapy.  Return to clinic in 3 weeks forKadcyla treatment    No orders of the defined types were placed in this encounter.  The patient has a good understanding of the overall plan. she agrees with it. she will call with any problems that may develop before the next visit here.  Nicholas Lose, MD 08/16/2019  Julious Oka Dorshimer am acting as scribe for Dr. Nicholas Lose.  I have reviewed the above documentation for accuracy and completeness, and I agree with the above.

## 2019-08-16 ENCOUNTER — Encounter: Payer: Self-pay | Admitting: *Deleted

## 2019-08-16 ENCOUNTER — Other Ambulatory Visit: Payer: Self-pay

## 2019-08-16 ENCOUNTER — Inpatient Hospital Stay: Payer: Medicare HMO

## 2019-08-16 ENCOUNTER — Inpatient Hospital Stay (HOSPITAL_BASED_OUTPATIENT_CLINIC_OR_DEPARTMENT_OTHER): Payer: Medicare HMO | Admitting: Hematology and Oncology

## 2019-08-16 DIAGNOSIS — C50411 Malignant neoplasm of upper-outer quadrant of right female breast: Secondary | ICD-10-CM

## 2019-08-16 DIAGNOSIS — Z7982 Long term (current) use of aspirin: Secondary | ICD-10-CM | POA: Diagnosis not present

## 2019-08-16 DIAGNOSIS — Z170 Estrogen receptor positive status [ER+]: Secondary | ICD-10-CM

## 2019-08-16 DIAGNOSIS — Z791 Long term (current) use of non-steroidal anti-inflammatories (NSAID): Secondary | ICD-10-CM | POA: Diagnosis not present

## 2019-08-16 DIAGNOSIS — Z5112 Encounter for antineoplastic immunotherapy: Secondary | ICD-10-CM | POA: Diagnosis not present

## 2019-08-16 DIAGNOSIS — Z79811 Long term (current) use of aromatase inhibitors: Secondary | ICD-10-CM | POA: Diagnosis not present

## 2019-08-16 DIAGNOSIS — Z9011 Acquired absence of right breast and nipple: Secondary | ICD-10-CM | POA: Diagnosis not present

## 2019-08-16 DIAGNOSIS — Z79899 Other long term (current) drug therapy: Secondary | ICD-10-CM | POA: Diagnosis not present

## 2019-08-16 LAB — CBC WITH DIFFERENTIAL (CANCER CENTER ONLY)
Abs Immature Granulocytes: 0.02 10*3/uL (ref 0.00–0.07)
Basophils Absolute: 0 10*3/uL (ref 0.0–0.1)
Basophils Relative: 1 %
Eosinophils Absolute: 0.1 10*3/uL (ref 0.0–0.5)
Eosinophils Relative: 2 %
HCT: 36.2 % (ref 36.0–46.0)
Hemoglobin: 12 g/dL (ref 12.0–15.0)
Immature Granulocytes: 0 %
Lymphocytes Relative: 11 %
Lymphs Abs: 0.5 10*3/uL — ABNORMAL LOW (ref 0.7–4.0)
MCH: 30.8 pg (ref 26.0–34.0)
MCHC: 33.1 g/dL (ref 30.0–36.0)
MCV: 93.1 fL (ref 80.0–100.0)
Monocytes Absolute: 0.4 10*3/uL (ref 0.1–1.0)
Monocytes Relative: 9 %
Neutro Abs: 3.7 10*3/uL (ref 1.7–7.7)
Neutrophils Relative %: 77 %
Platelet Count: 211 10*3/uL (ref 150–400)
RBC: 3.89 MIL/uL (ref 3.87–5.11)
RDW: 14.1 % (ref 11.5–15.5)
WBC Count: 4.8 10*3/uL (ref 4.0–10.5)
nRBC: 0 % (ref 0.0–0.2)

## 2019-08-16 LAB — CMP (CANCER CENTER ONLY)
ALT: 29 U/L (ref 0–44)
AST: 25 U/L (ref 15–41)
Albumin: 3.9 g/dL (ref 3.5–5.0)
Alkaline Phosphatase: 88 U/L (ref 38–126)
Anion gap: 10 (ref 5–15)
BUN: 19 mg/dL (ref 6–20)
CO2: 27 mmol/L (ref 22–32)
Calcium: 8.7 mg/dL — ABNORMAL LOW (ref 8.9–10.3)
Chloride: 103 mmol/L (ref 98–111)
Creatinine: 0.7 mg/dL (ref 0.44–1.00)
GFR, Est AFR Am: >60 mL/min (ref >60)
GFR, Estimated: >60 mL/min (ref >60)
Glucose, Bld: 94 mg/dL (ref 70–99)
Potassium: 3.6 mmol/L (ref 3.5–5.1)
Sodium: 140 mmol/L (ref 135–145)
Total Bilirubin: 0.5 mg/dL (ref 0.3–1.2)
Total Protein: 6.6 g/dL (ref 6.5–8.1)

## 2019-08-16 MED ORDER — HEPARIN SOD (PORK) LOCK FLUSH 100 UNIT/ML IV SOLN
500.0000 [IU] | Freq: Once | INTRAVENOUS | Status: AC | PRN
  Administered 2019-08-16: 500 [IU]
  Filled 2019-08-16: qty 5

## 2019-08-16 MED ORDER — ACETAMINOPHEN 325 MG PO TABS
650.0000 mg | ORAL_TABLET | Freq: Once | ORAL | Status: AC
  Administered 2019-08-16: 650 mg via ORAL

## 2019-08-16 MED ORDER — ACETAMINOPHEN 325 MG PO TABS
ORAL_TABLET | ORAL | Status: AC
  Filled 2019-08-16: qty 2

## 2019-08-16 MED ORDER — SODIUM CHLORIDE 0.9% FLUSH
10.0000 mL | INTRAVENOUS | Status: DC | PRN
  Administered 2019-08-16: 10 mL
  Filled 2019-08-16: qty 10

## 2019-08-16 MED ORDER — OXYCODONE-ACETAMINOPHEN 5-325 MG PO TABS: 1.0000 | ORAL_TABLET | Freq: Three times a day (TID) | ORAL | 0 refills | Status: DC | PRN

## 2019-08-16 MED ORDER — DIPHENHYDRAMINE HCL 25 MG PO CAPS
50.0000 mg | ORAL_CAPSULE | Freq: Once | ORAL | Status: AC
  Administered 2019-08-16: 50 mg via ORAL

## 2019-08-16 MED ORDER — SODIUM CHLORIDE 0.9 % IV SOLN
3.0000 mg/kg | Freq: Once | INTRAVENOUS | Status: AC
  Administered 2019-08-16: 160 mg via INTRAVENOUS
  Filled 2019-08-16: qty 8

## 2019-08-16 MED ORDER — SODIUM CHLORIDE 0.9 % IV SOLN
Freq: Once | INTRAVENOUS | Status: AC
  Administered 2019-08-16: 10:00:00 via INTRAVENOUS
  Filled 2019-08-16: qty 250

## 2019-08-16 MED ORDER — DIPHENHYDRAMINE HCL 25 MG PO CAPS
ORAL_CAPSULE | ORAL | Status: AC
  Filled 2019-08-16: qty 2

## 2019-08-16 NOTE — Assessment & Plan Note (Signed)
11/21/2018:Screening detected right breast mass upper outer quadrant, in addition to areas of calcifications which were not biopsied. By ultrasound she had 2 breast masses 12 o'clock position 2.9 cm: Grade 2 IDC with high-grade DCIS ER 100%, PR 70%, Ki-67 10%, HER-2 3+ by IHC and 11 o'clock position 1 cm, ER 90%, PR 50%, Ki-67 15%, HER-2 3+ by IHC, T2N0 stage Ib  Neoadjuvant chemotherapy with [facility] x6 cycles 12/12/2018-03/27/2019  04/30/2019:Right mastectomy: Grade 2 IDC, 3.9 cm, high-grade DCIS, margins are negative, 0/2 lymph nodes negative, ER 95%, PR 80%, HER-2 2+ equivocal, T2 N0  Treatment plan: 1.Adjuvant Kadcylastarted 05/24/2019 2.Adjuvant antiestrogen therapy with anastrozole 1 mg p.o. daily x7 yearsstarted 05/24/2019 ----------------------------------------------------------------------------------------------------------------------------- Current treatment: Cycle5Kadcyla maintenance(last Kadcyla to be given 11/29/2019) Labs reviewed Kadcyla toxicities: 1.Loss of appetite 2.Fatigue 3.Leukopenia probably from prior chemotherapy continue to monitor  I reducedthe dosage of Kadcylapreviously.  Echocardiogram will be done every 3 months No adverse effects to anastrozole therapy.  Return to clinic in 3 weeks forKadcyla treatment 

## 2019-08-16 NOTE — Patient Instructions (Signed)
[facility] Discharge Instructions for Patients Receiving Chemotherapy  Today you received the following chemotherapy agents Kadcyla  To help prevent nausea and vomiting after your treatment, we encourage you to take your nausea medication as directed   If you develop nausea and vomiting that is not controlled by your nausea medication, call the clinic.   BELOW ARE SYMPTOMS THAT SHOULD BE REPORTED IMMEDIATELY:  *FEVER GREATER THAN 100.5 F  *CHILLS WITH OR WITHOUT FEVER  NAUSEA AND VOMITING THAT IS NOT CONTROLLED WITH YOUR NAUSEA MEDICATION  *UNUSUAL SHORTNESS OF BREATH  *UNUSUAL BRUISING OR BLEEDING  TENDERNESS IN MOUTH AND THROAT WITH OR WITHOUT PRESENCE OF ULCERS  *URINARY PROBLEMS  *BOWEL PROBLEMS  UNUSUAL RASH Items with * indicate a potential emergency and should be followed up as soon as possible.  Feel free to call the clinic should you have any questions or concerns. The clinic phone number is [phone].  Please show the CHEMO ALERT CARD at check-in to the Emergency Department and triage nurse.   

## 2019-09-05 NOTE — Progress Notes (Signed)
Patient Care Team: Neva Seat, MD as PCP - General (Internal Medicine) Excell Seltzer, MD as Consulting Physician (General Surgery) Nicholas Lose, MD as Consulting Physician (Hematology and Oncology) Kyung Rudd, MD as Consulting Physician (Radiation Oncology)  DIAGNOSIS:    ICD-10-CM   1. Malignant neoplasm of upper-outer quadrant of right breast in female, estrogen receptor positive ([place])  C50.411    Z17.0     SUMMARY OF ONCOLOGIC HISTORY: Oncology History  Malignant neoplasm of upper-outer quadrant of right breast in female, estrogen receptor positive ([place])  11/21/2018 Initial Diagnosis   Screening detected right breast mass upper outer quadrant, in addition to areas of calcifications which were not biopsied.  By ultrasound she had 2 breast masses 12 o'clock position 2.9 cm: Grade 2 IDC with high-grade DCIS ER 100%, PR 70%, Ki-67 10%, HER-2 3+ by IHC and 11 o'clock position 1 cm, ER 90%, PR 50%, Ki-67 15%, HER-2 3+ by IHC, T2N0 stage Ib   11/29/2018 Cancer Staging   Staging form: Breast, AJCC 8th Edition - Clinical stage from 11/29/2018: Stage IB (cT2, cN0, cM0, G3, ER+, PR+, HER2+) - Signed by Nicholas Lose, MD on 11/29/2018   12/12/2018 -  Neo-Adjuvant Chemotherapy   Neoadjuvant chemotherapy with [facility]   01/18/2019 - 01/20/2019 Hospital Admission   Syncope   04/30/2019 Surgery   Right mastectomy: Grade 2 IDC, 3.9 cm, high-grade DCIS, margins are negative, 0/2 lymph nodes negative, ER 95%, PR 80%, HER-2 2+ equivocal, T2 N0   04/30/2019 Cancer Staging   Staging form: Breast, AJCC 8th Edition - Pathologic stage from 04/30/2019: No Stage Recommended (ypT2, pN0, cM0, G2, ER+, PR+, HER2-) - Signed by Gardenia Phlegm, NP on 05/09/2019   05/24/2019 -  Chemotherapy   The patient had ado-trastuzumab emtansine (KADCYLA) 200 mg in sodium chloride 0.9 % 250 mL chemo infusion, 240 mg, Intravenous, Once, 5 of 10 cycles Dose modification: 3 mg/kg (original dose 3.6 mg/kg, Cycle  5, Reason: Dose not tolerated) Administration: 200 mg (05/24/2019), 200 mg (06/14/2019), 160 mg (08/16/2019), 160 mg (07/05/2019), 160 mg (07/26/2019)  for chemotherapy treatment.    05/24/2019 -  Anti-estrogen oral therapy   Anastrozole 1 mg daily     CHIEF COMPLIANT: Follow-up ofKadcyla maintenance  INTERVAL HISTORY: Joel Mericle is a 58 y.o. with above-mentioned history of right breast cancer who completed neoadjuvant chemotherapy with [place] and underwent a right mastectomy.Sheis currently on adjuvant treatment withKadcyla maintenanceand anti-estrogen therapy with anastrozole.She presents to the clinic todayfor treatment. She is complaining of profound lower back pain with pain radiating down the leg along with some numbness in the legs as well.  This started couple of days ago and she has been taking lots of oxycodone and tramadol.  She is run out of her pain medications today.  She rates her pain today at 3 out of 10 but at its worst it is 10 out of 10.  There are no clear aggravating or relieving factors.  REVIEW OF SYSTEMS:   Constitutional: Denies fevers, chills or abnormal weight loss Eyes: Denies blurriness of vision Ears, nose, mouth, throat, and face: Denies mucositis or sore throat Respiratory: Denies cough, dyspnea or wheezes Cardiovascular: Denies palpitation, chest discomfort Gastrointestinal: Denies nausea, heartburn or change in bowel habits Skin: Denies abnormal skin rashes Lymphatics: Denies new lymphadenopathy or easy bruising Neurological: Low back pain with pain radiating down the leg Behavioral/Psych: Mood is stable, no new changes  Extremities: No lower extremity edema Breast: denies any pain or lumps or  nodules in either breasts All other systems were reviewed with the patient and are negative.  I have reviewed the past medical history, past surgical history, social history and family history with the patient and they are unchanged from previous  note.  ALLERGIES:  is allergic to procaine.  MEDICATIONS:  Current Outpatient Medications  Medication Sig Dispense Refill  . anastrozole (ARIMIDEX) 1 MG tablet Take 1 tablet (1 mg total) by mouth daily. 90 tablet 3  . aspirin-acetaminophen-caffeine (EXCEDRIN MIGRAINE) 250-250-65 MG tablet Take 2 tablets by mouth every 6 (six) hours as needed for migraine.    . betamethasone valerate lotion (VALISONE) 0.1 % Apply 1 application topically daily as needed for irritation.     . carbidopa-levodopa (SINEMET IR) 10-100 MG tablet Take 1 tablet by mouth at bedtime. 90 tablet 3  . diclofenac (VOLTAREN) 75 MG EC tablet Take 1 tablet (75 mg total) by mouth 2 (two) times daily. 60 tablet 5  . diphenhydrAMINE (BENADRYL) 25 MG tablet Take 25 mg by mouth daily.    . fluticasone (FLONASE) 50 MCG/ACT nasal spray Place 2 sprays into both nostrils at bedtime.    . gabapentin (NEURONTIN) 300 MG capsule Take 2 capsules (600 mg total) by mouth 2 (two) times daily. 360 capsule 3  . lisinopril (ZESTRIL) 5 MG tablet TAKE 1 TABLET BY MOUTH EVERY DAY 90 tablet 1  . methocarbamol (ROBAXIN) 500 MG tablet Take 1 tablet (500 mg total) by mouth every 8 (eight) hours as needed for muscle spasms. 20 tablet 1  . Multiple Vitamin (MULTIVITAMIN WITH MINERALS) TABS tablet Take 2 tablets by mouth daily.    Marland Kitchen omeprazole (PRILOSEC OTC) 20 MG tablet Take 20 mg by mouth daily as needed (acid reflux).    Marland Kitchen oxyCODONE-acetaminophen (PERCOCET/ROXICET) 5-325 MG tablet Take 1 tablet by mouth every 8 (eight) hours as needed for severe pain. 30 tablet 0  . PARoxetine (PAXIL) 20 MG tablet TAKE 1 TABLET BY MOUTH EVERY DAY 90 tablet 1  . TECFIDERA 240 MG CPDR Take 1 capsule by mouth twice daily 60 capsule 11  . traMADol (ULTRAM) 50 MG tablet Take 1 tablet (50 mg total) by mouth 2 (two) times daily as needed. 60 tablet 3  . VENTOLIN HFA 108 (90 Base) MCG/ACT inhaler Inhale 1-2 puffs into the lungs every 6 (six) hours as needed for wheezing or  shortness of breath. (Patient taking differently: Inhale 2 puffs into the lungs every 6 (six) hours as needed for wheezing or shortness of breath. ) 1 Inhaler 3   No current facility-administered medications for this visit.     PHYSICAL EXAMINATION: ECOG PERFORMANCE STATUS: 1 - Symptomatic but completely ambulatory  Vitals:   09/06/19 0925  BP: 135/79  Pulse: 71  Resp: 18  Temp: 98.2 F (36.8 C)  SpO2: 100%   Filed Weights   09/06/19 0925  Weight: 119 lb 1.6 oz (54 kg)    GENERAL: alert, no distress and comfortable SKIN: skin color, texture, turgor are normal, no rashes or significant lesions EYES: normal, Conjunctiva are pink and non-injected, sclera clear OROPHARYNX: no exudate, no erythema and lips, buccal mucosa, and tongue normal  NECK: supple, thyroid normal size, non-tender, without nodularity LYMPH: no palpable lymphadenopathy in the cervical, axillary or inguinal LUNGS: clear to auscultation and percussion with normal breathing effort HEART: regular rate & rhythm and no murmurs and no lower extremity edema ABDOMEN: abdomen soft, non-tender and normal bowel sounds MUSCULOSKELETAL: no cyanosis of digits and no clubbing  NEURO: alert &  oriented x 3 with fluent speech, no focal motor/sensory deficits EXTREMITIES: No lower extremity edema  LABORATORY DATA:  I have reviewed the data as listed CMP Latest Ref Rng & Units 08/16/2019 07/26/2019 07/05/2019  Glucose 70 - 99 mg/dL 94 92 99  BUN 6 - 20 mg/dL 19 18 21(H)  Creatinine 0.44 - 1.00 mg/dL 0.70 0.76 0.83  Sodium 135 - 145 mmol/L 140 140 140  Potassium 3.5 - 5.1 mmol/L 3.6 3.8 3.3(L)  Chloride 98 - 111 mmol/L 103 105 103  CO2 22 - 32 mmol/L _0 Calcium 8.9 - 10.3 mg/dL 8.7(L) 8.3(L) 8.9  Total Protein 6.5 - 8.1 g/dL 6.6 6.2(L) 6.7  Total Bilirubin 0.3 - 1.2 mg/dL 0.5 0.2(L) 0.3  Alkaline Phos 38 - 126 U/L 88 73 87  AST 15 - 41 U/L 25 25 36  ALT 0 - 44 U/L 29 27 35    Lab Results  Component Value Date    WBC 3.7 (L) 09/06/2019   HGB 12.6 09/06/2019   HCT 38.2 09/06/2019   MCV 94.8 09/06/2019   PLT 223 09/06/2019   NEUTROABS 2.6 09/06/2019    ASSESSMENT & PLAN:  Malignant neoplasm of upper-outer quadrant of right breast in female, estrogen receptor positive ([place]) 11/21/2018:Screening detected right breast mass upper outer quadrant, in addition to areas of calcifications which were not biopsied. By ultrasound she had 2 breast masses 12 o'clock position 2.9 cm: Grade 2 IDC with high-grade DCIS ER 100%, PR 70%, Ki-67 10%, HER-2 3+ by IHC and 11 o'clock position 1 cm, ER 90%, PR 50%, Ki-67 15%, HER-2 3+ by IHC, T2N0 stage Ib  Neoadjuvant chemotherapy with [facility] x6 cycles 12/12/2018-03/27/2019  04/30/2019:Right mastectomy: Grade 2 IDC, 3.9 cm, high-grade DCIS, margins are negative, 0/2 lymph nodes negative, ER 95%, PR 80%, HER-2 2+ equivocal, T2 N0  Treatment plan: 1.Adjuvant Kadcylastarted 05/24/2019 2.Adjuvant antiestrogen therapy with anastrozole 1 mg p.o. daily x7 yearsstarted 05/24/2019 ----------------------------------------------------------------------------------------------------------------------------- Current treatment: Cycle5Kadcyla maintenance(last Kadcyla to be given 11/29/2019) Labs reviewed Kadcyla toxicities: 1.Loss of appetite 2.Fatigue 3.Leukopenia probably from prior chemotherapy continue to monitor  I reducedthe dosage of Kadcylapreviously.  Low back pain with pain radiating down the leg: I will get a bone scan and a lumbar spine MRI for further evaluation. I refill her prescription for Percocet today.  Echocardiogram will be done every 3 months No adverse effects to anastrozole therapy.  Return to clinic in 3 weeks forKadcyla treatment    No orders of the defined types were placed in this encounter.  The patient has a good understanding of the overall plan. she agrees with it. she will call with any problems that may develop before the  next visit here.  Nicholas Lose, MD 09/06/2019  Julious Oka Dorshimer am acting as scribe for Dr. Nicholas Lose.  I have reviewed the above documentation for accuracy and completeness, and I agree with the above.

## 2019-09-06 ENCOUNTER — Inpatient Hospital Stay: Payer: Medicare HMO

## 2019-09-06 ENCOUNTER — Inpatient Hospital Stay (HOSPITAL_BASED_OUTPATIENT_CLINIC_OR_DEPARTMENT_OTHER): Payer: Medicare HMO | Admitting: Hematology and Oncology

## 2019-09-06 ENCOUNTER — Other Ambulatory Visit: Payer: Self-pay

## 2019-09-06 ENCOUNTER — Inpatient Hospital Stay: Payer: Medicare HMO | Attending: Hematology and Oncology

## 2019-09-06 VITALS — BP 145/79 | HR 68 | Temp 98.7°F | Resp 18

## 2019-09-06 VITALS — BP 135/79 | HR 71 | Temp 98.2°F | Resp 18 | Ht 62.0 in | Wt 119.1 lb

## 2019-09-06 DIAGNOSIS — Z7982 Long term (current) use of aspirin: Secondary | ICD-10-CM | POA: Diagnosis not present

## 2019-09-06 DIAGNOSIS — Z79899 Other long term (current) drug therapy: Secondary | ICD-10-CM | POA: Insufficient documentation

## 2019-09-06 DIAGNOSIS — C50411 Malignant neoplasm of upper-outer quadrant of right female breast: Secondary | ICD-10-CM | POA: Insufficient documentation

## 2019-09-06 DIAGNOSIS — Z170 Estrogen receptor positive status [ER+]: Secondary | ICD-10-CM

## 2019-09-06 DIAGNOSIS — M545 Low back pain, unspecified: Secondary | ICD-10-CM

## 2019-09-06 DIAGNOSIS — Z79811 Long term (current) use of aromatase inhibitors: Secondary | ICD-10-CM | POA: Diagnosis not present

## 2019-09-06 DIAGNOSIS — Z5112 Encounter for antineoplastic immunotherapy: Secondary | ICD-10-CM | POA: Insufficient documentation

## 2019-09-06 DIAGNOSIS — Z23 Encounter for immunization: Secondary | ICD-10-CM

## 2019-09-06 DIAGNOSIS — Z791 Long term (current) use of non-steroidal anti-inflammatories (NSAID): Secondary | ICD-10-CM | POA: Diagnosis not present

## 2019-09-06 DIAGNOSIS — Z9011 Acquired absence of right breast and nipple: Secondary | ICD-10-CM | POA: Insufficient documentation

## 2019-09-06 DIAGNOSIS — Z95828 Presence of other vascular implants and grafts: Secondary | ICD-10-CM

## 2019-09-06 LAB — CMP (CANCER CENTER ONLY)
ALT: 32 U/L (ref 0–44)
AST: 31 U/L (ref 15–41)
Albumin: 4.1 g/dL (ref 3.5–5.0)
Alkaline Phosphatase: 89 U/L (ref 38–126)
Anion gap: 10 (ref 5–15)
BUN: 22 mg/dL — ABNORMAL HIGH (ref 6–20)
CO2: 28 mmol/L (ref 22–32)
Calcium: 8.8 mg/dL — ABNORMAL LOW (ref 8.9–10.3)
Chloride: 106 mmol/L (ref 98–111)
Creatinine: 0.74 mg/dL (ref 0.44–1.00)
GFR, Est AFR Am: >60 mL/min (ref >60)
GFR, Estimated: >60 mL/min (ref >60)
Glucose, Bld: 82 mg/dL (ref 70–99)
Potassium: 3.7 mmol/L (ref 3.5–5.1)
Sodium: 144 mmol/L (ref 135–145)
Total Bilirubin: 0.4 mg/dL (ref 0.3–1.2)
Total Protein: 6.8 g/dL (ref 6.5–8.1)

## 2019-09-06 LAB — CBC WITH DIFFERENTIAL (CANCER CENTER ONLY)
Abs Immature Granulocytes: 0.01 10*3/uL (ref 0.00–0.07)
Basophils Absolute: 0.1 10*3/uL (ref 0.0–0.1)
Basophils Relative: 1 %
Eosinophils Absolute: 0.1 10*3/uL (ref 0.0–0.5)
Eosinophils Relative: 2 %
HCT: 38.2 % (ref 36.0–46.0)
Hemoglobin: 12.6 g/dL (ref 12.0–15.0)
Immature Granulocytes: 0 %
Lymphocytes Relative: 17 %
Lymphs Abs: 0.6 10*3/uL — ABNORMAL LOW (ref 0.7–4.0)
MCH: 31.3 pg (ref 26.0–34.0)
MCHC: 33 g/dL (ref 30.0–36.0)
MCV: 94.8 fL (ref 80.0–100.0)
Monocytes Absolute: 0.4 10*3/uL (ref 0.1–1.0)
Monocytes Relative: 11 %
Neutro Abs: 2.6 10*3/uL (ref 1.7–7.7)
Neutrophils Relative %: 69 %
Platelet Count: 223 10*3/uL (ref 150–400)
RBC: 4.03 MIL/uL (ref 3.87–5.11)
RDW: 15 % (ref 11.5–15.5)
WBC Count: 3.7 10*3/uL — ABNORMAL LOW (ref 4.0–10.5)
nRBC: 0 % (ref 0.0–0.2)

## 2019-09-06 MED ORDER — SODIUM CHLORIDE 0.9 % IV SOLN
Freq: Once | INTRAVENOUS | Status: AC
  Administered 2019-09-06: 10:00:00 via INTRAVENOUS
  Filled 2019-09-06: qty 250

## 2019-09-06 MED ORDER — ACETAMINOPHEN 325 MG PO TABS
650.0000 mg | ORAL_TABLET | Freq: Once | ORAL | Status: AC
  Administered 2019-09-06: 11:00:00 650 mg via ORAL

## 2019-09-06 MED ORDER — INFLUENZA VAC SPLIT QUAD 0.5 ML IM SUSY
PREFILLED_SYRINGE | INTRAMUSCULAR | Status: AC
  Filled 2019-09-06: qty 0.5

## 2019-09-06 MED ORDER — SODIUM CHLORIDE 0.9% FLUSH
10.0000 mL | Freq: Once | INTRAVENOUS | Status: AC
  Administered 2019-09-06: 10 mL
  Filled 2019-09-06: qty 10

## 2019-09-06 MED ORDER — SODIUM CHLORIDE 0.9 % IV SOLN
3.0000 mg/kg | Freq: Once | INTRAVENOUS | Status: AC
  Administered 2019-09-06: 11:00:00 160 mg via INTRAVENOUS
  Filled 2019-09-06: qty 8

## 2019-09-06 MED ORDER — INFLUENZA VAC SPLIT QUAD 0.5 ML IM SUSY
0.5000 mL | PREFILLED_SYRINGE | Freq: Once | INTRAMUSCULAR | Status: AC
  Administered 2019-09-06: 0.5 mL via INTRAMUSCULAR

## 2019-09-06 MED ORDER — DIPHENHYDRAMINE HCL 25 MG PO CAPS
ORAL_CAPSULE | ORAL | Status: AC
  Filled 2019-09-06: qty 2

## 2019-09-06 MED ORDER — SODIUM CHLORIDE 0.9% FLUSH
10.0000 mL | INTRAVENOUS | Status: DC | PRN
  Administered 2019-09-06: 12:00:00 10 mL
  Filled 2019-09-06: qty 10

## 2019-09-06 MED ORDER — DIPHENHYDRAMINE HCL 25 MG PO CAPS
50.0000 mg | ORAL_CAPSULE | Freq: Once | ORAL | Status: AC
  Administered 2019-09-06: 11:00:00 50 mg via ORAL

## 2019-09-06 MED ORDER — OXYCODONE-ACETAMINOPHEN 5-325 MG PO TABS: 1.0000 | ORAL_TABLET | Freq: Three times a day (TID) | ORAL | 0 refills | Status: DC | PRN

## 2019-09-06 MED ORDER — HEPARIN SOD (PORK) LOCK FLUSH 100 UNIT/ML IV SOLN
500.0000 [IU] | Freq: Once | INTRAVENOUS | Status: AC | PRN
  Administered 2019-09-06: 500 [IU]
  Filled 2019-09-06: qty 5

## 2019-09-06 MED ORDER — ACETAMINOPHEN 325 MG PO TABS
ORAL_TABLET | ORAL | Status: AC
  Filled 2019-09-06: qty 2

## 2019-09-06 NOTE — Patient Instructions (Addendum)
[place] Discharge Instructions for Patients Receiving Chemotherapy  Today you received the following chemotherapy agents Ado-trastuzumab [facility]).  To help prevent nausea and vomiting after your treatment, we encourage you to take your nausea medication as prescribed.   If you develop nausea and vomiting that is not controlled by your nausea medication, call the clinic.   BELOW ARE SYMPTOMS THAT SHOULD BE REPORTED IMMEDIATELY:  *FEVER GREATER THAN 100.5 F  *CHILLS WITH OR WITHOUT FEVER  NAUSEA AND VOMITING THAT IS NOT CONTROLLED WITH YOUR NAUSEA MEDICATION  *UNUSUAL SHORTNESS OF BREATH  *UNUSUAL BRUISING OR BLEEDING  TENDERNESS IN MOUTH AND THROAT WITH OR WITHOUT PRESENCE OF ULCERS  *URINARY PROBLEMS  *BOWEL PROBLEMS  UNUSUAL RASH Items with * indicate a potential emergency and should be followed up as soon as possible.  Feel free to call the clinic should you have any questions or concerns. The clinic phone number is [phone].  Please show the [place] at check-in to the Emergency Department and triage nurse.  Influenza Virus Vaccine injection What is this medicine? INFLUENZA VIRUS VACCINE (in floo EN zuh VAHY ruhs vak SEEN) helps to reduce the risk of getting influenza also known as the flu. The vaccine only helps protect you against some strains of the flu. This medicine may be used for other purposes; ask your health care provider or pharmacist if you have questions. COMMON BRAND NAME(S): Afluria, Afluria Quadrivalent, Agriflu, Alfuria, FLUAD, Fluarix, Fluarix Quadrivalent, Flublok, Flublok Quadrivalent, FLUCELVAX, Flulaval, Fluvirin, Fluzone, Fluzone High-Dose, Fluzone Intradermal What should I tell my health care provider before I take this medicine? They need to know if you have any of these conditions:  bleeding disorder like hemophilia  fever or infection  Guillain-Barre syndrome or other neurological problems  immune system  problems  infection with the human immunodeficiency virus (HIV) or AIDS  low blood platelet counts  multiple sclerosis  an unusual or allergic reaction to influenza virus vaccine, latex, other medicines, foods, dyes, or preservatives. Different brands of vaccines contain different allergens. Some may contain latex or eggs. Talk to your doctor about your allergies to make sure that you get the right vaccine.  pregnant or trying to get pregnant  breast-feeding How should I use this medicine? This vaccine is for injection into a muscle or under the skin. It is given by a health care professional. A copy of Vaccine Information Statements will be given before each vaccination. Read this sheet carefully each time. The sheet may change frequently. Talk to your healthcare provider to see which vaccines are right for you. Some vaccines should not be used in all age groups. Overdosage: If you think you have taken too much of this medicine contact a poison control center or emergency room at once. NOTE: This medicine is only for you. Do not share this medicine with others. What if I miss a dose? This does not apply. What may interact with this medicine?  chemotherapy or radiation therapy  medicines that lower your immune system like etanercept, anakinra, infliximab, and adalimumab  medicines that treat or prevent blood clots like warfarin  phenytoin  steroid medicines like prednisone or cortisone  theophylline  vaccines This list may not describe all possible interactions. Give your health care provider a list of all the medicines, herbs, non-prescription drugs, or dietary supplements you use. Also tell them if you smoke, drink alcohol, or use illegal drugs. Some items may interact with your medicine. What should I watch for while using  this medicine? Report any side effects that do not go away within 3 days to your doctor or health care professional. Call your health care provider if any  unusual symptoms occur within 6 weeks of receiving this vaccine. You may still catch the flu, but the illness is not usually as bad. You cannot get the flu from the vaccine. The vaccine will not protect against colds or other illnesses that may cause fever. The vaccine is needed every year. What side effects may I notice from receiving this medicine? Side effects that you should report to your doctor or health care professional as soon as possible:  allergic reactions like skin rash, itching or hives, swelling of the face, lips, or tongue Side effects that usually do not require medical attention (report to your doctor or health care professional if they continue or are bothersome):  fever  headache  muscle aches and pains  pain, tenderness, redness, or swelling at the injection site  tiredness This list may not describe all possible side effects. Call your doctor for medical advice about side effects. You may report side effects to FDA at 1-800-FDA-1088. Where should I keep my medicine? The vaccine will be given by a health care professional in a clinic, pharmacy, doctor's office, or other health care setting. You will not be given vaccine doses to store at home. NOTE: This sheet is a summary. It may not cover all possible information. If you have questions about this medicine, talk to your doctor, pharmacist, or health care provider.  2020 Elsevier/Gold Standard (2018-09-05 08:45:43)  Coronavirus (COVID-19) Are you at risk?  Are you at risk for the Coronavirus (COVID-19)?  To be considered HIGH RISK for Coronavirus (COVID-19), you have to meet the following criteria:  . Traveled to [place], [place], [place], [place] or [place]; or in the [place] to [place], [place], [place], or [place]; and have fever, cough, and shortness of breath within the last 2 weeks of travel OR . Been in close contact with a person diagnosed with COVID-19 within the last 2 weeks and have fever,  cough, and shortness of breath . IF YOU DO NOT MEET THESE CRITERIA, YOU ARE CONSIDERED LOW RISK FOR COVID-19.  What to do if you are HIGH RISK for COVID-19?  Marland Kitchen If you are having a medical emergency, call 911. . Seek medical care right away. Before you go to a doctor's office, urgent care or emergency department, call ahead and tell them about your recent travel, contact with someone diagnosed with COVID-19, and your symptoms. You should receive instructions from your physician's office regarding next steps of care.  . When you arrive at healthcare provider, tell the healthcare staff immediately you have returned from visiting [place], [place], [place], [place] or [place]; or traveled in the [place] to [place], [place], [place], or [place]; in the last two weeks or you have been in close contact with a person diagnosed with COVID-19 in the last 2 weeks.   . Tell the health care staff about your symptoms: fever, cough and shortness of breath. . After you have been seen by a medical provider, you will be either: o Tested for (COVID-19) and discharged home on quarantine except to seek medical care if symptoms worsen, and asked to  - Stay home and avoid contact with others until you get your results (4-5 days)  - Avoid travel on public transportation if possible (such as bus, train, or airplane) or o [employer]  to the Emergency Department by EMS for evaluation, COVID-19 testing, and possible admission depending on your condition and test results.  What to do if you are LOW RISK for COVID-19?  Reduce your risk of any infection by using the same precautions used for avoiding the common cold or flu:  Marland Kitchen Wash your hands often with soap and warm water for at least 20 seconds.  If soap and water are not readily available, use an alcohol-based hand sanitizer with at least 60% alcohol.  . If coughing or sneezing, cover your mouth and nose by coughing or sneezing into the elbow areas of your shirt or coat,  into a tissue or into your sleeve (not your hands). . Avoid shaking hands with others and consider head nods or verbal greetings only. . Avoid touching your eyes, nose, or mouth with unwashed hands.  . Avoid close contact with people who are sick. . Avoid places or events with large numbers of people in one location, like concerts or sporting events. . Carefully consider travel plans you have or are making. . If you are planning any travel outside or inside the [place], visit the CDC's Travelers' Health webpage for the latest health notices. . If you have some symptoms but not all symptoms, continue to monitor at home and seek medical attention if your symptoms worsen. . If you are having a medical emergency, call 911.   [place] / e-Visit: [email or web address]         [facility]: [facility]: [phone]                   [facility]: [phone]

## 2019-09-06 NOTE — Assessment & Plan Note (Signed)
11/21/2018:Screening detected right breast mass upper outer quadrant, in addition to areas of calcifications which were not biopsied. By ultrasound she had 2 breast masses 12 o'clock position 2.9 cm: Grade 2 IDC with high-grade DCIS ER 100%, PR 70%, Ki-67 10%, HER-2 3+ by IHC and 11 o'clock position 1 cm, ER 90%, PR 50%, Ki-67 15%, HER-2 3+ by IHC, T2N0 stage Ib  Neoadjuvant chemotherapy with [facility] x6 cycles 12/12/2018-03/27/2019  04/30/2019:Right mastectomy: Grade 2 IDC, 3.9 cm, high-grade DCIS, margins are negative, 0/2 lymph nodes negative, ER 95%, PR 80%, HER-2 2+ equivocal, T2 N0  Treatment plan: 1.Adjuvant Kadcylastarted 05/24/2019 2.Adjuvant antiestrogen therapy with anastrozole 1 mg p.o. daily x7 yearsstarted 05/24/2019 ----------------------------------------------------------------------------------------------------------------------------- Current treatment: Cycle5Kadcyla maintenance(last Kadcyla to be given 11/29/2019) Labs reviewed Kadcyla toxicities: 1.Loss of appetite 2.Fatigue 3.Leukopenia probably from prior chemotherapy continue to monitor  I reducedthe dosage of Kadcylapreviously.  Echocardiogram will be done every 3 months No adverse effects to anastrozole therapy.  Return to clinic in 3 weeks forKadcyla treatment 

## 2019-09-07 ENCOUNTER — Telehealth: Payer: Self-pay

## 2019-09-07 NOTE — Telephone Encounter (Signed)
Pt scheduled for MRI/Bone Scan on 11/30.  Arrival time 8:45am.  Pt notified, voiced understanding.

## 2019-09-12 ENCOUNTER — Encounter: Payer: Self-pay | Admitting: Neurology

## 2019-09-12 ENCOUNTER — Other Ambulatory Visit: Payer: Self-pay

## 2019-09-12 ENCOUNTER — Ambulatory Visit: Payer: Medicare HMO | Admitting: Neurology

## 2019-09-12 VITALS — BP 122/76 | HR 73 | Temp 97.6°F | Ht 62.0 in | Wt 118.0 lb

## 2019-09-12 DIAGNOSIS — G35 Multiple sclerosis: Secondary | ICD-10-CM | POA: Diagnosis not present

## 2019-09-12 DIAGNOSIS — C50411 Malignant neoplasm of upper-outer quadrant of right female breast: Secondary | ICD-10-CM | POA: Diagnosis not present

## 2019-09-12 DIAGNOSIS — R269 Unspecified abnormalities of gait and mobility: Secondary | ICD-10-CM | POA: Diagnosis not present

## 2019-09-12 DIAGNOSIS — H9193 Unspecified hearing loss, bilateral: Secondary | ICD-10-CM | POA: Diagnosis not present

## 2019-09-12 DIAGNOSIS — R202 Paresthesia of skin: Secondary | ICD-10-CM

## 2019-09-12 DIAGNOSIS — Z170 Estrogen receptor positive status [ER+]: Secondary | ICD-10-CM | POA: Diagnosis not present

## 2019-09-12 NOTE — Progress Notes (Signed)
[facility]  PATIENT: Debbie Watts DOB: 14-Feb-1961    HISTORICAL  CHIEF COMPLAINT:  Chief Complaint  Patient presents with   Follow-up    Rm 12 here for 6 month f/u last visit was 03/20/2019   Multiple Sclerosis    On Tecfidera sts she has been doing ok~ reports more stress at home. sts when she wakes up in the am lower back pain has increased oncologist rx'd oxycodone to help when tramadol is not working     HISTORY OF PRESENT ILLNESS:  Debbie Watts is a 58 y.o. woman with multiple sclerosis.  Update 09/12/2019: She feels she is stable and has no exacerbations.   She is on Tecfidera and tolerates it well.    She feels her gait is the same (off balanced) and has no recent falls.  Legs seem a little weak if she squats.   She has dysesthesias in hr legs/feet.    Bladder function is better,   Vision is fine.    She has fatigue.  She sleeps well some nights.    She notes pain in her left arm when she reaches up.   The surgery was the opposite side.   She has LBP also.    She takes tramadol for pain and oxycodone if more pain.    She has some stress issues but feels mood is stable.   She had breast cancer surgery in June and is on chemotherapy (Perjeta and Kadcycla and anastrozole).   She had Grade 2 IDC   She sees Dr. Lindi Adie.  Her son, daughter-in law and two kids are now living with her and her daughter-in-law has psych issues.    Update 03/20/2019 (virtual): She denies new MS exacerbations or new symptoms.   She is on Tecfidera and tolerates it well.      Her gait is doing ok and she could walk 20 minutes without stopping (> 1 mile).   She fell 2 months ago but no injury.   She denies leg weakness.    No issues with dysesthesias.   She has urinary frequency but stopped oxybutynin  Due to UTI'sd.   Vision is doing ok..    Migraines are doing ok and she will take Tylenol or Excedrin Migraine with benefit   These occur 4-5 times a month.      She feels  fatigue occurs almost every day, more in the afternoons.      Sleep is variable.   RLS is helped by Sinemet and gabepentin.     She has some depression though Paxil has helped.       She was diagnosed with breast cancer January 2020.     She is on TCHP (docetaxel, carboplatin, trastuzumab, pertuzumab (Perjeta)).   She has had 5 cycles of chemotherapy and her last infusion will be June 2.  Labs were checked and lymphocyte counts have not dropped.    Update 09/19/2018: She feels she is mostly stable.   She denies exacerbations or new symptoms.   She is on Tecfidera and tolerates it well.      She feels gait is doing well and she could easily walk a mile.   She had one fall.   She has LBP if she stands or walk a while.  She  Also has RLS ans is helped by Sinemet at night.  She has urinary frequency but stopped oxybutynin due to frequent UTI's.  Sometimes she wakes up with headaches and takes Tylenol  or Excedrin Migraine.   These occur 4-5 times a month.      She feels fatigue occurs almost every day, more in the afternoons.      She sleeps well most nights.    She has some depression though Paxil has helped.       01/12/2018: She is currently on Tecfidera.  Before that she was on a study drug (ALKS 8700) that was very similar to take daily.  She feels her MS is doing well.  She has not had any exacerbations.  She is tolerating the medication well with no GI side effects or flushing.  She is walking ok and could walk 20 minutes or about a mile if she had to.     However, her back hurts if she stands a long time or walks longer.    She feels a little weaker on her right side and she has numbness in her right hand.    She has noted more urinary urgency the past year.    She wakes up 2 -3 times nightly for nocturia.  Other times, she notes hesitancy and sometimes does not completely empty.   She has no recent UTI.    Vision is stable.   She wearss glasses and has not had ON or diplopia in the past.      She  has fatigue.    She has some sleep onset insomnia.   She takes Sinemet and gabeontin for RLS with benefit.  She wakes up to use the bathroom and sometimes has trouble falling back asleep.    She has some stress with her boyfriend needing a foot amputation (DM, gangrene).   She sometimes has mild depression but not enough to consider a medication.   Cognitively, she is doing well.     MS history: She was diagnosed with MS in 1999 based on MRI and lumbar rupture.  At that time she was living in [place] and was seeing Dr. Tilden Fossa.  When he retired she saw another doctor and then she moved to this area around 2015 but did not seek neurologic referral until 2015.  She was on Avonex for many years but stopped due to insurance.  Then, in November 2016, she started in the drug study and was on ALKS 8700 4 2 years.  The study ended in late 2018 for her and she switched over to [place].   Her last MRI 08/09/2017 showed stable MS lesions.       REVIEW OF SYSTEMS: Constitutional: No fevers, chills, sweats, or change in appetite.  She has fatigue.  She sometimes has insomnia. Eyes: No visual changes, double vision, eye pain Ear, nose and throat: No hearing loss, ear pain, nasal congestion, sore throat Cardiovascular: No chest pain, palpitations Respiratory: No shortness of breath at rest or with exertion.   No wheezes GastrointestinaI: No nausea, vomiting, diarrhea, abdominal pain, fecal incontinence Genitourinary:She has urinary frequency and nocturia . Musculoskeletal: No neck pain, back pain Integumentary: No rash, pruritus, skin lesions Neurological: as above Psychiatric: No depression at this time.  No anxiety Endocrine: No palpitations, diaphoresis, change in appetite, change in weigh or increased thirst Hematologic/Lymphatic: No anemia, purpura, petechiae. Allergic/Immunologic: No itchy/runny eyes, nasal congestion, recent allergic reactions, rashes  ALLERGIES: Allergies  Allergen Reactions    Procaine Shortness Of Breath    HOME MEDICATIONS:  Current Outpatient Medications:    anastrozole (ARIMIDEX) 1 MG tablet, Take 1 tablet (1 mg total) by mouth daily., Disp: 90 tablet, Rfl:  3   aspirin-acetaminophen-caffeine (EXCEDRIN MIGRAINE) 250-250-65 MG tablet, Take 2 tablets by mouth every 6 (six) hours as needed for migraine., Disp: , Rfl:    betamethasone valerate lotion (VALISONE) 0.1 %, Apply 1 application topically daily as needed for irritation. , Disp: , Rfl:    carbidopa-levodopa (SINEMET IR) 10-100 MG tablet, Take 1 tablet by mouth at bedtime., Disp: 90 tablet, Rfl: 3   diclofenac (VOLTAREN) 75 MG EC tablet, Take 1 tablet (75 mg total) by mouth 2 (two) times daily., Disp: 60 tablet, Rfl: 5   diphenhydrAMINE (BENADRYL) 25 MG tablet, Take 25 mg by mouth daily., Disp: , Rfl:    fluticasone (FLONASE) 50 MCG/ACT nasal spray, Place 2 sprays into both nostrils at bedtime., Disp: , Rfl:    gabapentin (NEURONTIN) 300 MG capsule, Take 2 capsules (600 mg total) by mouth 2 (two) times daily., Disp: 360 capsule, Rfl: 3   lisinopril (ZESTRIL) 5 MG tablet, TAKE 1 TABLET BY MOUTH EVERY DAY, Disp: 90 tablet, Rfl: 1   methocarbamol (ROBAXIN) 500 MG tablet, Take 1 tablet (500 mg total) by mouth every 8 (eight) hours as needed for muscle spasms., Disp: 20 tablet, Rfl: 1   Multiple Vitamin (MULTIVITAMIN WITH MINERALS) TABS tablet, Take 2 tablets by mouth daily., Disp: , Rfl:    omeprazole (PRILOSEC OTC) 20 MG tablet, Take 20 mg by mouth daily as needed (acid reflux)., Disp: , Rfl:    oxyCODONE-acetaminophen (PERCOCET/ROXICET) 5-325 MG tablet, Take 1 tablet by mouth every 8 (eight) hours as needed for severe pain., Disp: 60 tablet, Rfl: 0   PARoxetine (PAXIL) 20 MG tablet, TAKE 1 TABLET BY MOUTH EVERY DAY, Disp: 90 tablet, Rfl: 1   TECFIDERA 240 MG CPDR, Take 1 capsule by mouth twice daily, Disp: 60 capsule, Rfl: 11   traMADol (ULTRAM) 50 MG tablet, Take 1 tablet (50 mg total) by mouth 2  (two) times daily as needed., Disp: 60 tablet, Rfl: 3   VENTOLIN HFA 108 (90 Base) MCG/ACT inhaler, Inhale 1-2 puffs into the lungs every 6 (six) hours as needed for wheezing or shortness of breath. (Patient taking differently: Inhale 2 puffs into the lungs every 6 (six) hours as needed for wheezing or shortness of breath. ), Disp: 1 Inhaler, Rfl: 3  PAST MEDICAL HISTORY: Past Medical History:  Diagnosis Date   Arthritis    Asthma    Cancer ([place])    chemo for breast ca  x30mos   Depression    Deviated nasal septum    Hearing loss 06/11/2014   Bilateral   Herniated cervical disc    Hypertension    Migraine    Miscarriage    2   MS (multiple sclerosis) ([place])     PAST SURGICAL HISTORY: Past Surgical History:  Procedure Laterality Date   HEMATOMA EVACUATION Right 05/01/2019   Procedure: EVACUATION HEMATOMA;  Surgeon: Rolm Bookbinder, MD;  Location: [facility];  Service: General;  Laterality: Right;   MASTECTOMY W/ SENTINEL NODE BIOPSY Right 04/30/2019   Procedure: RIGHT MASTECTOMY WITH  RIGHT AXILLARY SENTINEL LYMPH NODE BIOPSY INJECT BLUE DYE;  Surgeon: Rolm Bookbinder, MD;  Location: [facility];  Service: General;  Laterality: Right;   NASAL SEPTUM SURGERY     SIMPLE MASTECTOMY WITH AXILLARY SENTINEL NODE BIOPSY Right 04/30/2019   spinal injections      FAMILY HISTORY: Family History  Adopted: Yes  Problem Relation Age of Onset   Cancer Maternal Uncle        throat    SOCIAL  HISTORY:  Social History   Socioeconomic History   Marital status: Single    Spouse name: Not on file   Number of children: 2   Years of education: 12   Highest education level: Not on file  Occupational History   Occupation: Disabled  Scientist, product/process development strain: Not on file   Food insecurity    Worry: Not on file    Inability: Not on file   Transportation needs    Medical: Not on file    Non-medical: Not on file  Tobacco Use   Smoking status: Current  Every Day Smoker    Packs/day: 0.50    Years: 44.00    Pack years: 22.00    Types: Cigarettes   Smokeless tobacco: Never Used   Tobacco comment: 1/2  to 3/4 pack per day   Substance and Sexual Activity   Alcohol use: Yes    Comment: 0-5 beers daily   Drug use: Yes    Types: Marijuana   Sexual activity: Not Currently    Partners: Male  Lifestyle   Physical activity    Days per week: Not on file    Minutes per session: Not on file   Stress: Not on file  Relationships   Social connections    Talks on phone: Not on file    Gets together: Not on file    Attends religious service: Not on file    Active member of club or organization: Not on file    Attends meetings of clubs or organizations: Not on file    Relationship status: Not on file   Intimate partner violence    Fear of current or ex partner: Not on file    Emotionally abused: Not on file    Physically abused: Not on file    Forced sexual activity: Not on file  Other Topics Concern   Not on file  Social History Narrative   Patient is single with 2 children.   Patient is right handed.   Current Social History 08/10/2018        Right handed       Patient lives with significant other [name]) in one level Townhome 08/10/2018    Transportation: Patient has own vehicle  08/10/2018   Important Relationships [name] 08/10/2018    Pets: one dog named Browser 08/10/2018   Education / Work:  12th grade/ Disabled 08/10/2018   Interests / Fun: Watching baseball and Nascar 08/10/2018   Current Stressors: Significant other is depressed over his poor health 08/10/2018   Religious / Personal Beliefs: "I believe in God" 08/10/2018   Other: "I had a miscarriage before I had my 2 sons." 08/10/2018   L. Ducatte, RN, BSN                                                                                                   PHYSICAL EXAM  Vitals:   09/12/19 1302  BP: 122/76  Pulse: 73  Temp: 97.6 F (36.4 C)    TempSrc: Temporal  SpO2: 98%  Weight: 118 lb (  53.5 kg)  Height: 5\' 2"  (1.575 m)    Body mass index is 21.58 kg/m.   General: The patient is well-developed and well-nourished and in no acute distress.   Neck tenderness on the left.   Mild tenderness upper arm on left.      Neurologic Exam  Mental status: The patient is alert and oriented x 3 at the time of the examination. The patient has apparent normal recent and remote memory, with an apparently normal attention span and concentration ability.   Speech is normal.  Cranial nerves: Extraocular movements are full.  Facial strength and sensation was normal.  The trapezius strength was normal bilaterally.  The tongue is midline, and the patient has symmetric elevation of the soft palate. Bilateral hearing deficits are noted.  Motor:  Muscle bulk is normal.   Tone is normal. Strength is  5 / 5 in all 4 extremities.   Sensory: She had slight asymmetry of sensation, reduced on the left to touch and vibration  Coordination: Cerebellar testing shows good finger-nose-finger.  Heel-to-shin is mildly reduced bilaterally.  Gait and station: Station is normal.   The gait is mildly wide.  Tandem gait is very wide.  Romberg is negative.  Reflexes: Deep tendon reflexes are symmetric and normal in arms, increased in legs but no clonus at ankles.        DIAGNOSTIC DATA (LABS, IMAGING, TESTING) - I reviewed patient records, labs, notes, testing and imaging myself where available.  Lab Results  Component Value Date   WBC 3.7 (L) 09/06/2019   HGB 12.6 09/06/2019   HCT 38.2 09/06/2019   MCV 94.8 09/06/2019   PLT 223 09/06/2019      Component Value Date/Time   NA 144 09/06/2019 0839   NA 140 08/31/2017 1524   K 3.7 09/06/2019 0839   CL 106 09/06/2019 0839   CO2 28 09/06/2019 0839   GLUCOSE 82 09/06/2019 0839   BUN 22 (H) 09/06/2019 0839   BUN 17 08/31/2017 1524   CREATININE 0.74 09/06/2019 0839   CALCIUM 8.8 (L) 09/06/2019 0839   PROT  6.8 09/06/2019 0839   PROT 6.4 08/31/2017 1524   ALBUMIN 4.1 09/06/2019 0839   ALBUMIN 4.3 08/31/2017 1524   AST 31 09/06/2019 0839   ALT 32 09/06/2019 0839   ALKPHOS 89 09/06/2019 0839   BILITOT 0.4 09/06/2019 0839   GFRNONAA >60 09/06/2019 0839   GFRAA >60 09/06/2019 0839   No results found for: CHOL, HDL, LDLCALC, LDLDIRECT, TRIG, CHOLHDL Lab Results  Component Value Date   HGBA1C 5.3 12/07/2017   ________________________________________________  Multiple sclerosis ([place]) - Plan: MR BRAIN W WO CONTRAST, MR CERVICAL SPINE W WO CONTRAST  Paresthesias  Gait disturbance  Bilateral hearing loss, unspecified hearing loss type  Malignant neoplasm of upper-outer quadrant of right breast in female, estrogen receptor positive ([place])   1.  Continue Tecfidera.   Lymphocyte counts have been a little low (o.5- 0.6) but were 0.8 before chemotherapy.   We will check MRI of the brain and cervical spine and consider a different DNT if significant progression.   MRI cervical spine will also allow evaluation of the left arm pain, likely radicular. 2.   Continue tramadol for pain 3.   Stay active and exercise as tolerated. 4.   rtc in 6 months or sooner if new or worsening issues. Carle Fenech A. Felecia Shelling, MD, [facility] [id number], [id number] PM Certified in Neurology, Clinical Neurophysiology, Sleep Medicine, Pain Medicine and Neuroimaging  [facility]  [address] [place], [place] [address] [phone]

## 2019-09-17 ENCOUNTER — Telehealth (HOSPITAL_COMMUNITY): Payer: Self-pay | Admitting: *Deleted

## 2019-09-17 NOTE — Telephone Encounter (Signed)
COVID-19 pre-appointment screening questions: °  °Do you have a history of COVID-19 or a positive test result in the past 7-10 days? NO ° °To the best of your knowledge, have you been in close contact with anyone with a confirmed diagnosis of COVID 19? NO ° °Have you had any one or more of the following: Fever, chills, cough, shortness of breath (out of the normal for you) or any flu-like symptoms? NO ° °Are you experiencing any of the following symptoms that is new or out of usual for you: NO ° °• Ear, nose or throat discomfort °• Sore throat °• Headache °• Muscle Pain °• Diarrhea °• Loss of taste or smell ° ° °Reviewed all the following with patient: °• Use of hand sanitizer when entering the building °• Everyone is required to wear a mask in the building, if you do not have a mask we are happy to provide you with one when you arrive °• NO Visitor guidelines ° ° °If patient answers YES to any of questions they must change to a virtual visit and place note in comments about symptoms ° °

## 2019-09-18 ENCOUNTER — Ambulatory Visit (HOSPITAL_BASED_OUTPATIENT_CLINIC_OR_DEPARTMENT_OTHER)
Admission: RE | Admit: 2019-09-18 | Discharge: 2019-09-18 | Disposition: A | Discharge status: Home / Self Care | Payer: Medicare HMO | Source: Ambulatory Visit | Attending: Internal Medicine | Admitting: Internal Medicine

## 2019-09-18 ENCOUNTER — Other Ambulatory Visit: Payer: Self-pay

## 2019-09-18 ENCOUNTER — Encounter (HOSPITAL_COMMUNITY): Payer: Self-pay | Admitting: Internal Medicine

## 2019-09-18 ENCOUNTER — Ambulatory Visit (HOSPITAL_COMMUNITY)
Admission: RE | Admit: 2019-09-18 | Discharge: 2019-09-18 | Disposition: A | Discharge status: Home / Self Care | Payer: Medicare HMO | Source: Ambulatory Visit | Attending: Internal Medicine | Admitting: Internal Medicine

## 2019-09-18 VITALS — BP 130/80 | HR 75 | Wt 121.0 lb

## 2019-09-18 DIAGNOSIS — Z791 Long term (current) use of non-steroidal anti-inflammatories (NSAID): Secondary | ICD-10-CM | POA: Diagnosis not present

## 2019-09-18 DIAGNOSIS — I119 Hypertensive heart disease without heart failure: Secondary | ICD-10-CM | POA: Insufficient documentation

## 2019-09-18 DIAGNOSIS — G35 Multiple sclerosis: Secondary | ICD-10-CM | POA: Diagnosis not present

## 2019-09-18 DIAGNOSIS — Z720 Tobacco use: Secondary | ICD-10-CM | POA: Diagnosis not present

## 2019-09-18 DIAGNOSIS — J45909 Unspecified asthma, uncomplicated: Secondary | ICD-10-CM | POA: Diagnosis not present

## 2019-09-18 DIAGNOSIS — Z170 Estrogen receptor positive status [ER+]: Secondary | ICD-10-CM

## 2019-09-18 DIAGNOSIS — C50411 Malignant neoplasm of upper-outer quadrant of right female breast: Secondary | ICD-10-CM

## 2019-09-18 DIAGNOSIS — R55 Syncope and collapse: Secondary | ICD-10-CM | POA: Diagnosis not present

## 2019-09-18 DIAGNOSIS — I517 Cardiomegaly: Secondary | ICD-10-CM

## 2019-09-18 DIAGNOSIS — Z79899 Other long term (current) drug therapy: Secondary | ICD-10-CM | POA: Insufficient documentation

## 2019-09-18 DIAGNOSIS — F17210 Nicotine dependence, cigarettes, uncomplicated: Secondary | ICD-10-CM | POA: Insufficient documentation

## 2019-09-18 NOTE — Progress Notes (Signed)
  Echocardiogram 2D Echocardiogram has been performed.  Debbie Watts 09/18/2019, 11:19 AM

## 2019-09-18 NOTE — Patient Instructions (Signed)
Your physician recommends that you schedule a follow-up appointment in: 3 months with echocardiogram  

## 2019-09-18 NOTE — Progress Notes (Signed)
Cardio-Oncology Clinic Note  Due to national recommendations of social distancing due to [place] 19, Audio/video telehealth visit is felt to be most appropriate for this patient at this time.  See MyChart message from today for patient consent regarding telehealth for [facility].  Date:  09/18/2019   ID:  Debbie Watts, DOB [birth year], MRN [id number]  Location: Home  Provider location: [facility] Type of Visit: New patient (consult)  PCP:  Debbie Seat, MD  Cardiologist:  No primary care provider on file. Primary HF: Debbie Watts Referring: Debbie Watts  Chief Complaint: Breast CA   History of Present Illness:  Debbie Watts is a 58y/o woman with HTN, migraines, multiple sclerosis and right breast CA referred by Debbie Watts for enrollment into the cardio-oncology clinic for monitoring during her chemotherapy.  Diagnosed with R breast CA in 2/20. Started neo-adjuvant chemo with [facility] in 2/20.   Not overly activity.On disability due to MS. 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   Remaais on herceptin q 3 weeks. Doing well. No CP or SOB. Still smoking. Has some stress in her life as husband's daughter and her 3 kids are now living with them and suffering from post-partum depression.   Echo today EF 55-60% GLS -204%   Echo today EF 60-65% GLS -16.9%  Echo 12/06/18 EF 65-70% GLS -19.2 Echo 03/07/19  EF 60-65% GLS -13.4% (poor tracking)  SUMMARY OF ONCOLOGIC HISTORY:       Malignant neoplasm of upper-outer quadrant of right breast in female, estrogen receptor positive ([place])   11/21/2018 Initial Diagnosis    Screening detected right breast mass upper outer quadrant, in addition to areas of calcifications which were not biopsied.  By ultrasound she had 2 breast masses 12 o'clock position 2.9 cm: Grade 2 IDC with high-grade DCIS ER 100%, PR 70%, Ki-67 10%, HER-2 3+ by IHC and 11 o'clock position 1 cm, ER 90%, PR 50%, Ki-67 15%, HER-2 3+ by IHC, T2N0  stage Ib    11/29/2018 Cancer Staging    Staging form: Breast, AJCC 8th Edition - Clinical stage from 11/29/2018: Stage IB (cT2, cN0, cM0, G3, ER+, PR+, HER2+) - Signed by Debbie Lose, MD on 11/29/2018    12/12/2018 -  Neo-Adjuvant Chemotherapy    Neoadjuvant chemotherapy with [facility]    01/18/2019 - 01/20/2019 Hospital Admission    Syncope     Past Medical History:  Diagnosis Date  . Arthritis   . Asthma   . Cancer ([place])    chemo for breast ca  x5mo  . Depression   . Deviated nasal septum   . Hearing loss 06/11/2014   Bilateral  . Herniated cervical disc   . Hypertension   . Migraine   . Miscarriage    2  . [place]    Past Surgical History:  Procedure Laterality Date  . HEMATOMA EVACUATION Right 05/01/2019   Procedure: EVACUATION HEMATOMA;  Surgeon: WRolm Bookbinder MD;  [facility]  Service: General;  Laterality: Right;  . MASTECTOMY W/ SENTINEL NODE BIOPSY Right 04/30/2019   Procedure: RIGHT MASTECTOMY WITH  RIGHT AXILLARY SENTINEL LYMPH NODE BIOPSY INJECT BLUE DYE;  Surgeon: WRolm Bookbinder MD;  [facility]  Service: General;  Laterality: Right;  . NASAL SEPTUM SURGERY    . SIMPLE MASTECTOMY WITH AXILLARY SENTINEL NODE BIOPSY Right 04/30/2019  . spinal injections       Current Outpatient Medications  Medication Sig Dispense Refill  . albuterol (VENTOLIN  HFA) 108 (90 Base) MCG/ACT inhaler Inhale 2 puffs into the lungs every 6 (six) hours as needed for wheezing or shortness of breath.    . anastrozole (ARIMIDEX) 1 MG tablet Take 1 tablet (1 mg total) by mouth daily. 90 tablet 3  . aspirin-acetaminophen-caffeine (EXCEDRIN MIGRAINE) 250-250-65 MG tablet Take 2 tablets by mouth every 6 (six) hours as needed for migraine.    . betamethasone valerate lotion (VALISONE) 0.1 % Apply 1 application topically daily as needed for irritation.     . carbidopa-levodopa (SINEMET IR) 10-100 MG tablet Take 1 tablet by mouth at bedtime.  90 tablet 3  . diclofenac (VOLTAREN) 75 MG EC tablet Take 1 tablet (75 mg total) by mouth 2 (two) times daily. 60 tablet 5  . diphenhydrAMINE (BENADRYL) 25 MG tablet Take 25 mg by mouth daily.    . fluticasone (FLONASE) 50 MCG/ACT nasal spray Place 2 sprays into both nostrils at bedtime.    . gabapentin (NEURONTIN) 300 MG capsule Take 2 capsules (600 mg total) by mouth 2 (two) times daily. 360 capsule 3  . lisinopril (ZESTRIL) 5 MG tablet TAKE 1 TABLET BY MOUTH EVERY DAY 90 tablet 1  . methocarbamol (ROBAXIN) 500 MG tablet Take 1 tablet (500 mg total) by mouth every 8 (eight) hours as needed for muscle spasms. 20 tablet 1  . Multiple Vitamin (MULTIVITAMIN WITH MINERALS) TABS tablet Take 2 tablets by mouth daily.    Marland Kitchen omeprazole (PRILOSEC OTC) 20 MG tablet Take 20 mg by mouth daily as needed (acid reflux).    Marland Kitchen oxyCODONE-acetaminophen (PERCOCET/ROXICET) 5-325 MG tablet Take 1 tablet by mouth every 8 (eight) hours as needed for severe pain. 60 tablet 0  . PARoxetine (PAXIL) 20 MG tablet TAKE 1 TABLET BY MOUTH EVERY DAY 90 tablet 1  . TECFIDERA 240 MG CPDR Take 1 capsule by mouth twice daily 60 capsule 11  . traMADol (ULTRAM) 50 MG tablet Take 1 tablet (50 mg total) by mouth 2 (two) times daily as needed. 60 tablet 3   No current facility-administered medications for this encounter.     Allergies:   Procaine   Social History:  The patient  reports that she has been smoking cigarettes. She has a 22.00 pack-year smoking history. She has never used smokeless tobacco. She reports current alcohol use. She reports current drug use. Drug: Marijuana.   Family History:  The patient's family history includes Cancer in her maternal uncle. She was adopted.   ROS:  Please see the history of present illness.   All other systems are personally reviewed and negative.   Vitals:   09/18/19 1141  BP: 130/80  Pulse: 75  SpO2: 98%     Exam:  General:  Well appearing. No resp difficulty HEENT: normal  Neck: supple. no JVD. Carotids 2+ bilat; no bruits. No lymphadenopathy or thryomegaly appreciated. Cor: PMI nondisplaced. Regular rate & rhythm. No rubs, gallops or murmurs. Lungs: clear with decreased BS throughout Abdomen: soft, nontender, nondistended. No hepatosplenomegaly. No bruits or masses. Good bowel sounds. Extremities: no cyanosis, clubbing, rash, edema Neuro: alert & orientedx3, cranial nerves grossly intact. moves all 4 extremities w/o difficulty. Affect pleasant   Recent Labs: 01/19/2019: Magnesium 2.1 09/06/2019: ALT 32; BUN 22; Creatinine 0.74; Hemoglobin 12.6; Platelet Count 223; Potassium 3.7; Sodium 144  Personally reviewed   Wt Readings from Last 3 Encounters:  09/18/19 54.9 kg (121 lb)  09/12/19 53.5 kg (118 lb)  09/06/19 54 kg (119 lb 1.6 oz)  ASSESSMENT AND PLAN:  1. R breast cancer -I reviewed echos personally. EF and Doppler parameters stable. No HF on exam. Continue Herceptin.   2. Syncope - Suspect due to high vagal tone associated with defecation syncope. W/u negative - has not recurred  3. Tobacco use, ongoing - cessation encouraged again but she says it is hard to quit because her boyfriend is still smoking and she is under a lot of stress  Signed, Glori Bickers, MD  09/18/2019 11:58 AM  [address] [address] [phone] (office) [phone] (fax)

## 2019-09-18 NOTE — Addendum Note (Signed)
Encounter addended by: Scarlette Calico, RN on: 09/18/2019 12:20 PM  Actions taken: Order list changed, Diagnosis association updated

## 2019-09-24 ENCOUNTER — Ambulatory Visit (HOSPITAL_COMMUNITY)
Admission: RE | Admit: 2019-09-24 | Discharge: 2019-09-24 | Disposition: A | Discharge status: Home / Self Care | Payer: Medicare HMO | Source: Ambulatory Visit | Attending: Hematology and Oncology | Admitting: Hematology and Oncology

## 2019-09-24 ENCOUNTER — Other Ambulatory Visit: Payer: Self-pay

## 2019-09-24 ENCOUNTER — Encounter (HOSPITAL_COMMUNITY)
Admission: RE | Admit: 2019-09-24 | Discharge: 2019-09-24 | Disposition: A | Discharge status: Home / Self Care | Payer: Medicare HMO | Source: Ambulatory Visit | Attending: Hematology and Oncology | Admitting: Hematology and Oncology

## 2019-09-24 DIAGNOSIS — C50411 Malignant neoplasm of upper-outer quadrant of right female breast: Secondary | ICD-10-CM | POA: Insufficient documentation

## 2019-09-24 DIAGNOSIS — Z170 Estrogen receptor positive status [ER+]: Secondary | ICD-10-CM | POA: Diagnosis not present

## 2019-09-24 DIAGNOSIS — M545 Low back pain, unspecified: Secondary | ICD-10-CM

## 2019-09-24 DIAGNOSIS — C50911 Malignant neoplasm of unspecified site of right female breast: Secondary | ICD-10-CM | POA: Diagnosis not present

## 2019-09-24 DIAGNOSIS — M47816 Spondylosis without myelopathy or radiculopathy, lumbar region: Secondary | ICD-10-CM | POA: Diagnosis not present

## 2019-09-24 IMAGING — NM NM BONE WHOLE BODY
2 series · 2 of 2 positions shown · non-contrast
Comparison: None

Correlation: MRI lumbar spine [DATE], chest radiograph
[DATE]

CLINICAL DATA: RIGHT breast cancer question osseous metastatic
disease

EXAM:
NUCLEAR MEDICINE WHOLE BODY BONE SCAN
TECHNIQUE: Whole body anterior and posterior images were obtained approximately
3 hours after intravenous injection of radiopharmaceutical.
RADIOPHARMACEUTICALS:  20.5 mCi [03] MDP IV

[Series 1: wbr_bone_40 whole body · 2.66mm/px · 1 of 1 slices shown (1 of 2)]
[im 1/1]
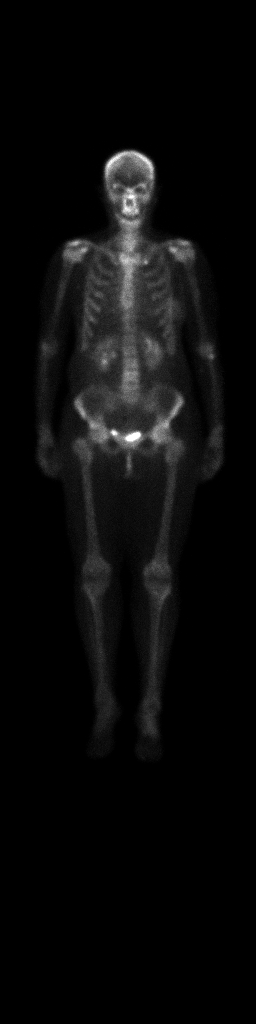

[Series 1: wbr_bone_40 whole body · 2.66mm/px · 1 of 1 slices shown (2 of 2)]
[im 1/1]
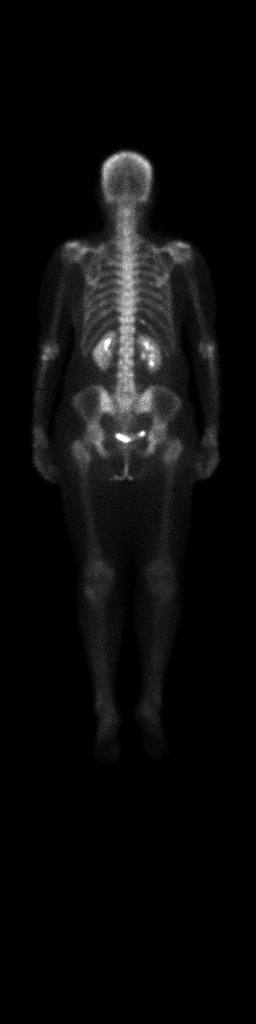

[2 of 2 positions shown; findings below may reference images not displayed]

FINDINGS: Uptake at the shoulders, sternoclavicular joints, elbows, and hips,
typically degenerative.

Single focus of abnormal uptake at the anterior LEFT first rib at
the costochondral junction, nonspecific but corresponding to a
prominent first costochondral junction on a prior chest radiograph.

Uptake at the posterior RIGHT 11th rib, nonspecific.

No additional sites of worrisome osseous tracer accumulation are
identified.

Expected urinary tract and soft tissue distribution of tracer.
IMPRESSION: Uptake at the posterior RIGHT 11th rib, nonspecific, could be due to
prior trauma or metastatic disease; recommend dedicated radiographic
assessment.

Uptake at LEFT first costochondral junction, likely degenerative.

## 2019-09-24 IMAGING — MR MR LUMBAR SPINE WO/W CM
4 of 7 series · 19 of 48 positions shown · IV contrast (gadavist)
Comparison: Lumbar MRI [DATE]

CLINICAL DATA: Breast cancer staging.  Low back pain.

EXAM:
MRI LUMBAR SPINE WITHOUT AND WITH CONTRAST
TECHNIQUE: Multiplanar and multiecho pulse sequences of the lumbar spine were
obtained without and with intravenous contrast.
CONTRAST:  5mL GADAVIST GADOBUTROL 1 MMOL/ML IV SOLN

[Series 3: T1 · sagittal · 4.0mm · 0.51mm/px · 3 of 12 slices shown (1 of 2)]
[im 1/12]
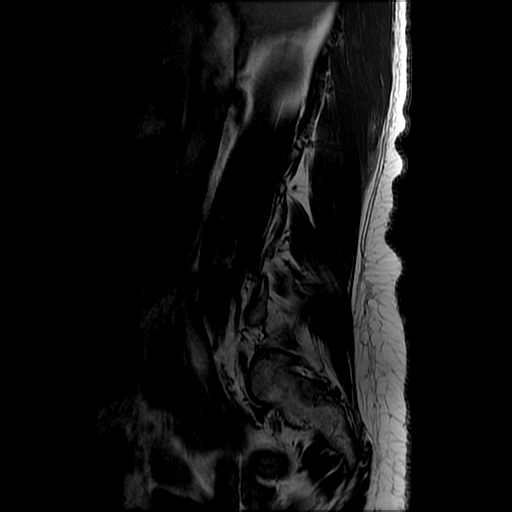
[im 6/12]
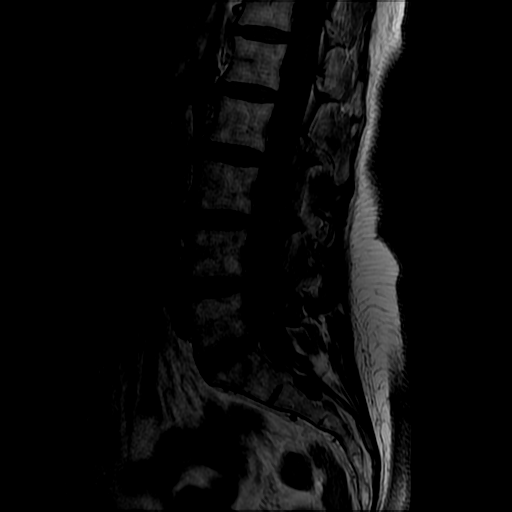
[im 12/12]
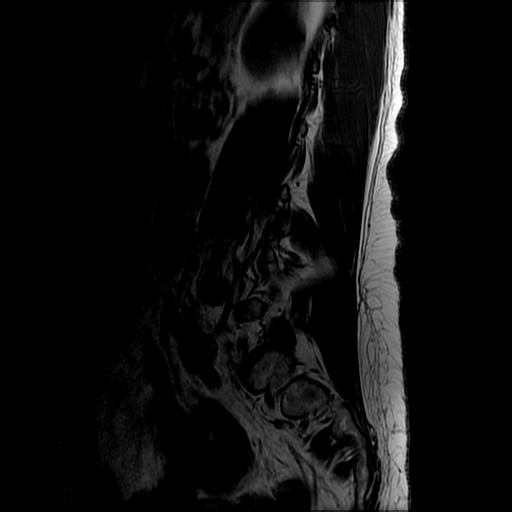

[Series 5: T2 · axial · 4.0mm · 0.39mm/px · z∈[-74,+102]mm · 10 of 35 slices shown]
[im 1/35]
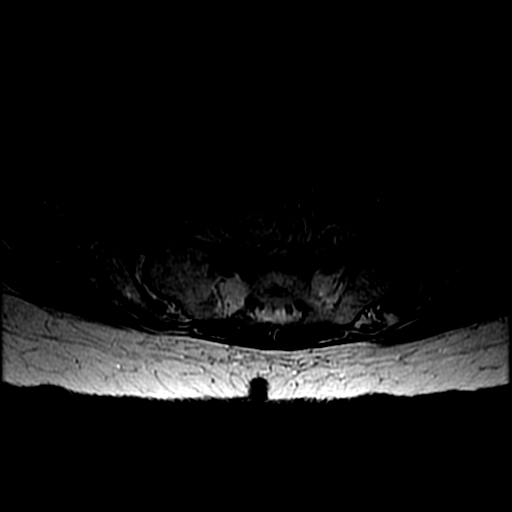
[im 4/35]
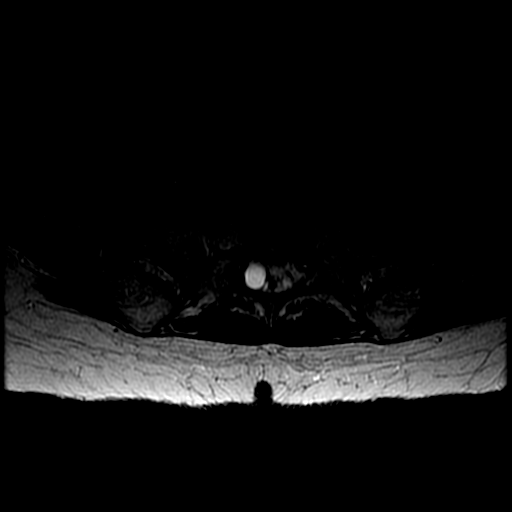
[im 7/35]
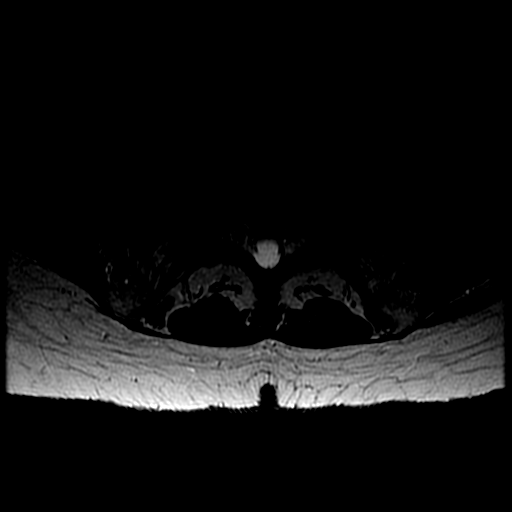
[im 11/35]
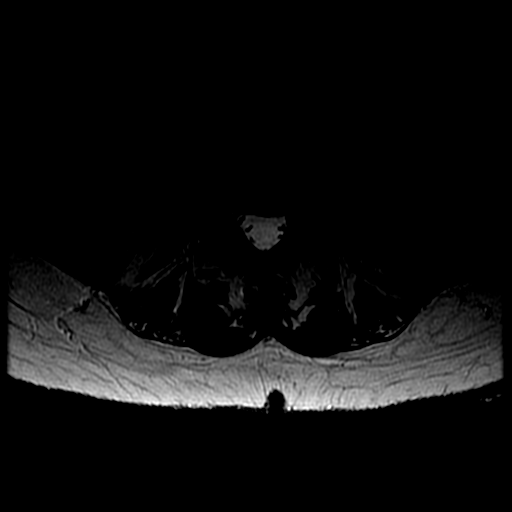
[im 14/35]
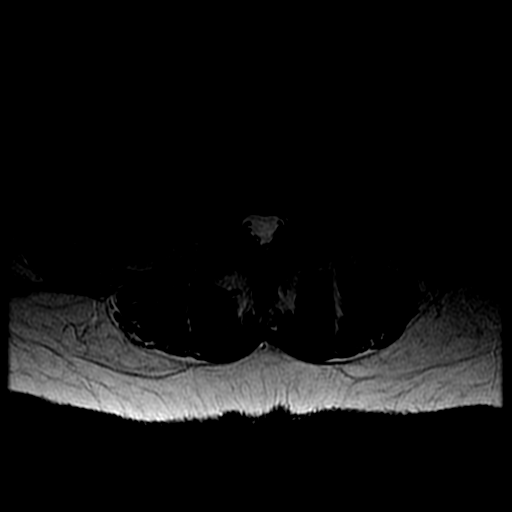
[im 18/35]
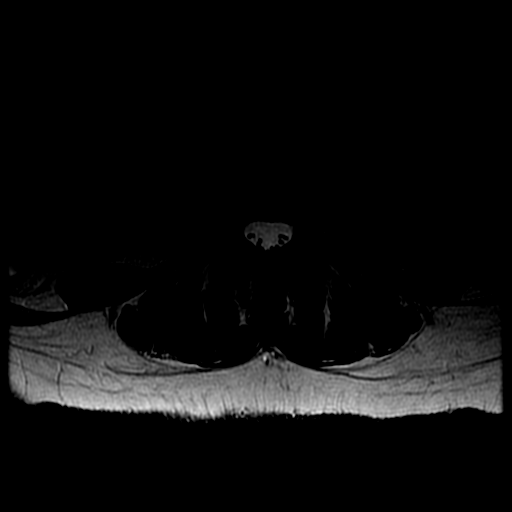
[im 21/35]
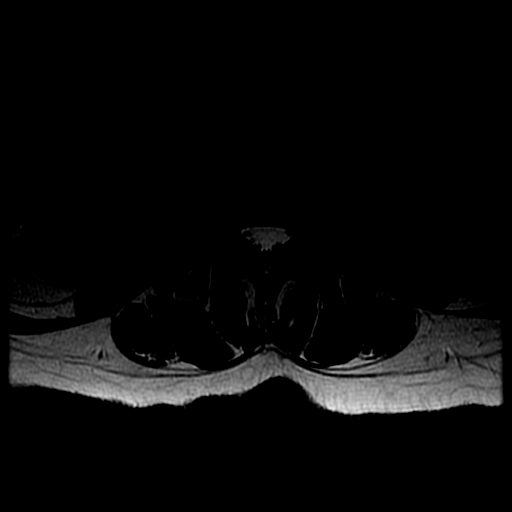
[im 24/35]
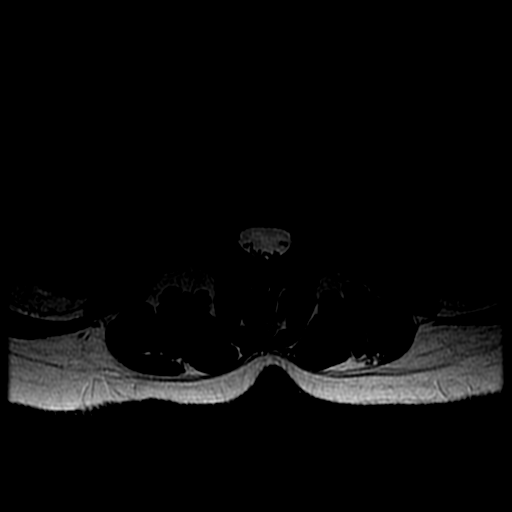
[im 28/35]
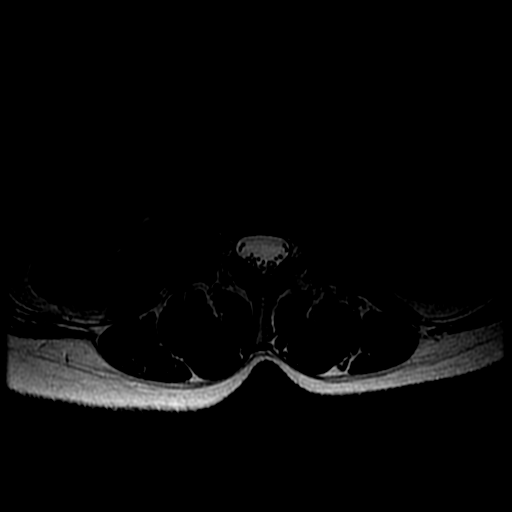
[im 31/35]
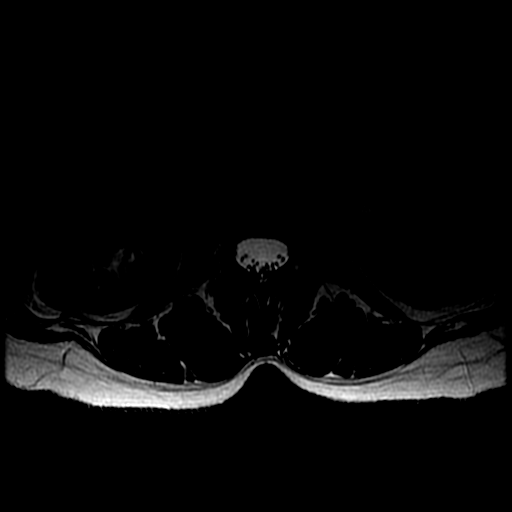

[Series 6: T1 · axial · 4.0mm · 0.39mm/px · z∈[-60,+102]mm · 3 of 35 slices shown (2 of 2)]
[im 4/35]
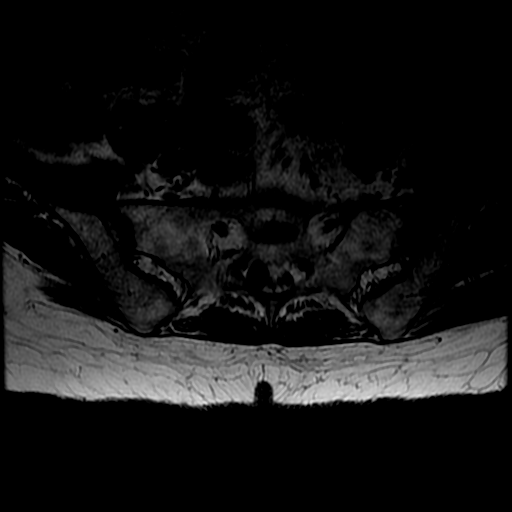
[im 18/35]
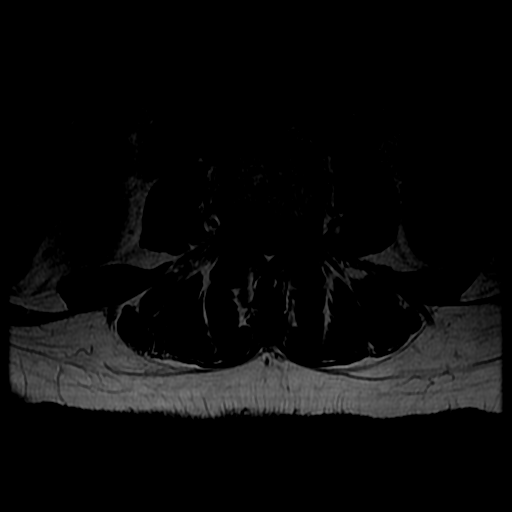
[im 31/35]
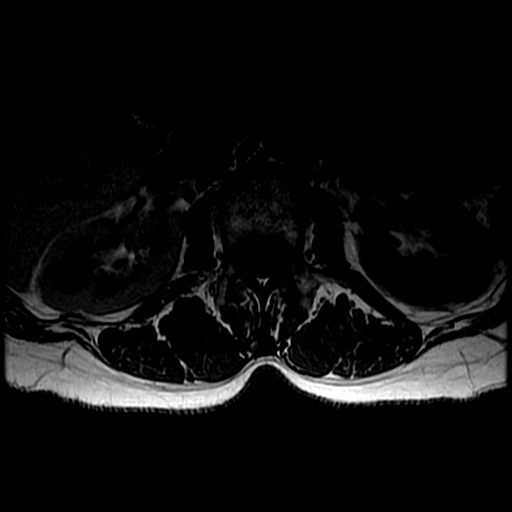

[Series 7: T2 post-contrast · sagittal · 4.0mm · 0.51mm/px · 3 of 12 slices shown]
[im 1/12]
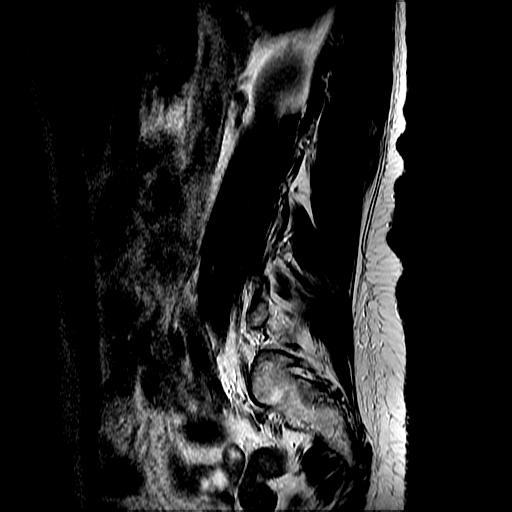
[im 8/12]
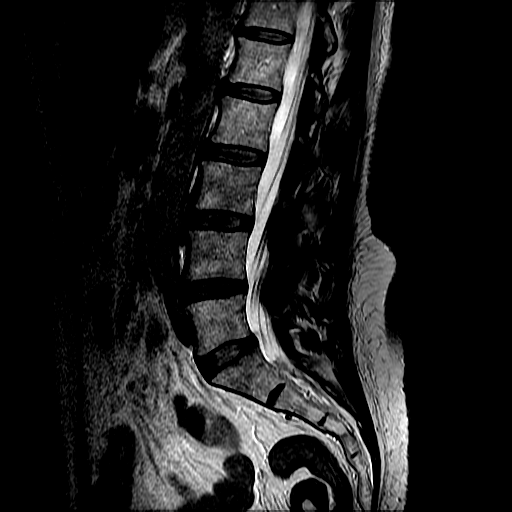
[im 12/12]
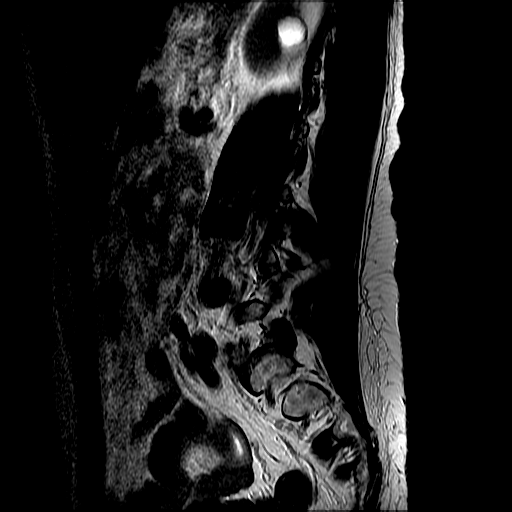

[19 of 48 positions shown; findings below may reference images not displayed]

FINDINGS: Segmentation:  Normal

Alignment:  Normal

Vertebrae: Negative for fracture or mass. Negative for metastatic
disease.

Conus medullaris and cauda equina: Conus extends to the L1-2 level.
Conus and cauda equina appear normal.

Paraspinal and other soft tissues: Negative for paraspinous mass or
adenopathy. No soft tissue mass or edema.

Disc levels:

L1-2: Negative

L2-3: Negative

L3-4: Normal disc space.  Mild facet degeneration without stenosis.

L4-5: Mild disc degeneration and mild facet degeneration. Negative
for disc protrusion or stenosis

L5-S1: Negative
IMPRESSION: Negative for metastatic disease. Mild degenerative changes in the
lumbar spine without disc protrusion or stenosis.

## 2019-09-24 MED ORDER — TECHNETIUM TC 99M MEDRONATE IV KIT
20.0000 | PACK | Freq: Once | INTRAVENOUS | Status: AC | PRN
  Administered 2019-09-24: 20.5 via INTRAVENOUS

## 2019-09-24 MED ORDER — GADOBUTROL 1 MMOL/ML IV SOLN
5.0000 mL | Freq: Once | INTRAVENOUS | Status: AC | PRN
  Administered 2019-09-24: 5 mL via INTRAVENOUS

## 2019-09-26 NOTE — Progress Notes (Signed)
Patient Care Team: Neva Seat, MD as PCP - General (Internal Medicine) Excell Seltzer, MD (Inactive) as Consulting Physician (General Surgery) Nicholas Lose, MD as Consulting Physician (Hematology and Oncology) Kyung Rudd, MD as Consulting Physician (Radiation Oncology)  DIAGNOSIS:    ICD-10-CM   1. Malignant neoplasm of upper-outer quadrant of right breast in female, estrogen receptor positive ([place])  C50.411    Z17.0     SUMMARY OF ONCOLOGIC HISTORY: Oncology History  Malignant neoplasm of upper-outer quadrant of right breast in female, estrogen receptor positive ([place])  11/21/2018 Initial Diagnosis   Screening detected right breast mass upper outer quadrant, in addition to areas of calcifications which were not biopsied.  By ultrasound she had 2 breast masses 12 o'clock position 2.9 cm: Grade 2 IDC with high-grade DCIS ER 100%, PR 70%, Ki-67 10%, HER-2 3+ by IHC and 11 o'clock position 1 cm, ER 90%, PR 50%, Ki-67 15%, HER-2 3+ by IHC, T2N0 stage Ib   11/29/2018 Cancer Staging   Staging form: Breast, AJCC 8th Edition - Clinical stage from 11/29/2018: Stage IB (cT2, cN0, cM0, G3, ER+, PR+, HER2+) - Signed by Nicholas Lose, MD on 11/29/2018   12/12/2018 -  Neo-Adjuvant Chemotherapy   Neoadjuvant chemotherapy with [facility]   01/18/2019 - 01/20/2019 Hospital Admission   Syncope   04/30/2019 Surgery   Right mastectomy: Grade 2 IDC, 3.9 cm, high-grade DCIS, margins are negative, 0/2 lymph nodes negative, ER 95%, PR 80%, HER-2 2+ equivocal, T2 N0   04/30/2019 Cancer Staging   Staging form: Breast, AJCC 8th Edition - Pathologic stage from 04/30/2019: No Stage Recommended (ypT2, pN0, cM0, G2, ER+, PR+, HER2-) - Signed by Gardenia Phlegm, NP on 05/09/2019   05/24/2019 -  Chemotherapy   The patient had ado-trastuzumab emtansine (KADCYLA) 200 mg in sodium chloride 0.9 % 250 mL chemo infusion, 240 mg, Intravenous, Once, 6 of 10 cycles Dose modification: 3 mg/kg (original dose 3.6  mg/kg, Cycle 5, Reason: Dose not tolerated) Administration: 200 mg (05/24/2019), 200 mg (06/14/2019), 160 mg (08/16/2019), 160 mg (09/06/2019), 160 mg (07/05/2019), 160 mg (07/26/2019)  for chemotherapy treatment.    05/24/2019 -  Anti-estrogen oral therapy   Anastrozole 1 mg daily     CHIEF COMPLIANT: Follow-up ofKadcyla maintenance  INTERVAL HISTORY: Debbie Watts is a 58 y.o. with above-mentioned history of right breast cancer who completed [facility]juvant chemotherapy [facility] underwent a right mastectomy.Sheis currently on adjuvant treatment withKadcyla maintenanceand anti-estrogen therapy with anastrozole.Echo on 09/18/19 showed an ejection fraction of 55-60%. MRI of lumbar spine on 09/24/19 showed no evidence of metastasis and degenerative changes. Bone scan on 09/24/19 showed no conclusive evidence of metastatic disease. She presents to the clinic todayfor treatment.  REVIEW OF SYSTEMS:   Constitutional: Denies fevers, chills or abnormal weight loss Eyes: Denies blurriness of vision Ears, nose, mouth, throat, and face: Denies mucositis or sore throat Respiratory: Denies cough, dyspnea or wheezes Cardiovascular: Denies palpitation, chest discomfort Gastrointestinal: Denies nausea, heartburn or change in bowel habits Skin: Denies abnormal skin rashes Lymphatics: Denies new lymphadenopathy or easy bruising Neurological: Denies numbness, tingling or new weaknesses Behavioral/Psych: Mood is stable, no new changes  Extremities: No lower extremity edema Breast: denies any pain or lumps or nodules in either breasts All other systems were reviewed with the patient and are negative.  I have reviewed the past medical history, past surgical history, social history and family history with the patient and they are unchanged from previous note.  ALLERGIES:  is allergic  to procaine.  MEDICATIONS:  Current Outpatient Medications  Medication Sig Dispense Refill  . albuterol  (VENTOLIN HFA) 108 (90 Base) MCG/ACT inhaler Inhale 2 puffs into the lungs every 6 (six) hours as needed for wheezing or shortness of breath.    . anastrozole (ARIMIDEX) 1 MG tablet Take 1 tablet (1 mg total) by mouth daily. 90 tablet 3  . aspirin-acetaminophen-caffeine (EXCEDRIN MIGRAINE) 250-250-65 MG tablet Take 2 tablets by mouth every 6 (six) hours as needed for migraine.    . betamethasone valerate lotion (VALISONE) 0.1 % Apply 1 application topically daily as needed for irritation.     . carbidopa-levodopa (SINEMET IR) 10-100 MG tablet Take 1 tablet by mouth at bedtime. 90 tablet 3  . diclofenac (VOLTAREN) 75 MG EC tablet Take 1 tablet (75 mg total) by mouth 2 (two) times daily. 60 tablet 5  . diphenhydrAMINE (BENADRYL) 25 MG tablet Take 25 mg by mouth daily.    . fluticasone (FLONASE) 50 MCG/ACT nasal spray Place 2 sprays into both nostrils at bedtime.    . gabapentin (NEURONTIN) 300 MG capsule Take 2 capsules (600 mg total) by mouth 2 (two) times daily. 360 capsule 3  . lisinopril (ZESTRIL) 5 MG tablet TAKE 1 TABLET BY MOUTH EVERY DAY 90 tablet 1  . methocarbamol (ROBAXIN) 500 MG tablet Take 1 tablet (500 mg total) by mouth every 8 (eight) hours as needed for muscle spasms. 20 tablet 1  . Multiple Vitamin (MULTIVITAMIN WITH MINERALS) TABS tablet Take 2 tablets by mouth daily.    Marland Kitchen omeprazole (PRILOSEC OTC) 20 MG tablet Take 20 mg by mouth daily as needed (acid reflux).    Marland Kitchen oxyCODONE-acetaminophen (PERCOCET/ROXICET) 5-325 MG tablet Take 1 tablet by mouth every 8 (eight) hours as needed for severe pain. 60 tablet 0  . PARoxetine (PAXIL) 20 MG tablet TAKE 1 TABLET BY MOUTH EVERY DAY 90 tablet 1  . TECFIDERA 240 MG CPDR Take 1 capsule by mouth twice daily 60 capsule 11  . traMADol (ULTRAM) 50 MG tablet Take 1 tablet (50 mg total) by mouth 2 (two) times daily as needed. 60 tablet 3   No current facility-administered medications for this visit.     PHYSICAL EXAMINATION: ECOG PERFORMANCE  STATUS: 1 - Symptomatic but completely ambulatory  Vitals:   09/27/19 0928  BP: (!) 153/78  Pulse: 71  Resp: 17  Temp: 97.9 F (36.6 C)  SpO2: 100%   Filed Weights   09/27/19 0928  Weight: 122 lb 1.6 oz (55.4 kg)    GENERAL: alert, no distress and comfortable SKIN: skin color, texture, turgor are normal, no rashes or significant lesions EYES: normal, Conjunctiva are pink and non-injected, sclera clear OROPHARYNX: no exudate, no erythema and lips, buccal mucosa, and tongue normal  NECK: supple, thyroid normal size, non-tender, without nodularity LYMPH: no palpable lymphadenopathy in the cervical, axillary or inguinal LUNGS: clear to auscultation and percussion with normal breathing effort HEART: regular rate & rhythm and no murmurs and no lower extremity edema ABDOMEN: abdomen soft, non-tender and normal bowel sounds MUSCULOSKELETAL: no cyanosis of digits and no clubbing  NEURO: alert & oriented x 3 with fluent speech, no focal motor/sensory deficits EXTREMITIES: No lower extremity edema  LABORATORY DATA:  I have reviewed the data as listed CMP Latest Ref Rng & Units 09/06/2019 08/16/2019 07/26/2019  Glucose 70 - 99 mg/dL 82 94 92  BUN 6 - 20 mg/dL 22(H) 19 18  Creatinine 0.44 - 1.00 mg/dL 0.74 0.70 0.76  Sodium 135 -  145 mmol/L 144 140 140  Potassium 3.5 - 5.1 mmol/L 3.7 3.6 3.8  Chloride 98 - 111 mmol/L 106 103 105  CO2 22 - 32 mmol/L '28 27 26  ' Calcium 8.9 - 10.3 mg/dL 8.8(L) 8.7(L) 8.3(L)  Total Protein 6.5 - 8.1 g/dL 6.8 6.6 6.2(L)  Total Bilirubin 0.3 - 1.2 mg/dL 0.4 0.5 0.2(L)  Alkaline Phos 38 - 126 U/L 89 88 73  AST 15 - 41 U/L '31 25 25  ' ALT 0 - 44 U/L 32 29 27    Lab Results  Component Value Date   WBC 3.8 (L) 09/27/2019   HGB 12.4 09/27/2019   HCT 37.7 09/27/2019   MCV 96.4 09/27/2019   PLT 223 09/27/2019   NEUTROABS 2.8 09/27/2019    ASSESSMENT & PLAN:  Malignant neoplasm of upper-outer quadrant of right breast in female, estrogen receptor positive  ([employer] [facility]) 11/21/2018:Screening detected right breast mass upper outer quadrant, in addition to areas of calcifications which were not biopsied. By ultrasound she had 2 breast masses 12 o'clock position 2.9 cm: Grade 2 IDC with high-grade DCIS ER 100%, PR 70%, Ki-67 10%, HER-2 3+ by IHC and 11 o'clock position 1 cm, ER 90%, PR 50%, Ki-67 15%, HER-2 3+ by IHC, T2N0 stage Ib  Neoadjuvant chemotherapy with [facility] x6 cycles 12/12/2018-03/27/2019  04/30/2019:Right mastectomy: Grade 2 IDC, 3.9 cm, high-grade DCIS, margins are negative, 0/2 lymph nodes negative, ER 95%, PR 80%, HER-2 2+ equivocal, T2 N0  Treatment plan: 1.Adjuvant Kadcylastarted 05/24/2019 2.Adjuvant antiestrogen therapy with anastrozole 1 mg p.o. daily x7 yearsstarted 05/24/2019 ----------------------------------------------------------------------------------------------------------------------------- Current treatment: Cycle5Kadcyla maintenance(last Kadcyla to be given 11/29/2019) Labs reviewed Kadcyla toxicities: 1.Loss of appetite 2.Fatigue Chemo-induced peripheral neuropathy: Monitoring.  She has MS and therefore it is difficult to assess what symptoms are related to MS and with symptoms related to Kadcyla. I reducedthe dosage of Kadcylapreviously.  Low back pain with pain radiating down the leg: On Percocet 09/24/2019: MRI lumbar spine: Negative for metastatic disease.  Mild degenerative changes without disc protrusion or stenosis 09/24/2019: Bone scan: Nonspecific uptake right 11th rib  Echocardiogram will be done every 3 months No adverse effects to anastrozole therapy.  Return to clinic in 3 weeks forKadcyla treatment    No orders of the defined types were placed in this encounter.  The patient has a good understanding of the overall plan. she agrees with it. she will call with any problems that may develop before the next visit here.  Nicholas Lose, MD 09/27/2019  Julious Oka Dorshimer, am acting as  scribe for Dr. Nicholas Lose.  I have reviewed the above documentation for accuracy and completeness, and I agree with the above.

## 2019-09-27 ENCOUNTER — Inpatient Hospital Stay: Payer: Medicare HMO

## 2019-09-27 ENCOUNTER — Other Ambulatory Visit: Payer: Self-pay

## 2019-09-27 ENCOUNTER — Inpatient Hospital Stay: Payer: Medicare HMO | Attending: Hematology and Oncology

## 2019-09-27 ENCOUNTER — Inpatient Hospital Stay (HOSPITAL_BASED_OUTPATIENT_CLINIC_OR_DEPARTMENT_OTHER): Payer: Medicare HMO | Admitting: Hematology and Oncology

## 2019-09-27 DIAGNOSIS — Z79811 Long term (current) use of aromatase inhibitors: Secondary | ICD-10-CM | POA: Diagnosis not present

## 2019-09-27 DIAGNOSIS — C50411 Malignant neoplasm of upper-outer quadrant of right female breast: Secondary | ICD-10-CM

## 2019-09-27 DIAGNOSIS — Z5112 Encounter for antineoplastic immunotherapy: Secondary | ICD-10-CM | POA: Diagnosis not present

## 2019-09-27 DIAGNOSIS — Z7982 Long term (current) use of aspirin: Secondary | ICD-10-CM | POA: Insufficient documentation

## 2019-09-27 DIAGNOSIS — T451X5A Adverse effect of antineoplastic and immunosuppressive drugs, initial encounter: Secondary | ICD-10-CM | POA: Diagnosis not present

## 2019-09-27 DIAGNOSIS — Z9221 Personal history of antineoplastic chemotherapy: Secondary | ICD-10-CM | POA: Insufficient documentation

## 2019-09-27 DIAGNOSIS — Z791 Long term (current) use of non-steroidal anti-inflammatories (NSAID): Secondary | ICD-10-CM | POA: Diagnosis not present

## 2019-09-27 DIAGNOSIS — M545 Low back pain: Secondary | ICD-10-CM | POA: Diagnosis not present

## 2019-09-27 DIAGNOSIS — Z9011 Acquired absence of right breast and nipple: Secondary | ICD-10-CM | POA: Insufficient documentation

## 2019-09-27 DIAGNOSIS — Z79899 Other long term (current) drug therapy: Secondary | ICD-10-CM | POA: Diagnosis not present

## 2019-09-27 DIAGNOSIS — Z170 Estrogen receptor positive status [ER+]: Secondary | ICD-10-CM | POA: Insufficient documentation

## 2019-09-27 DIAGNOSIS — G620 Drug-induced polyneuropathy: Secondary | ICD-10-CM | POA: Insufficient documentation

## 2019-09-27 DIAGNOSIS — Z95828 Presence of other vascular implants and grafts: Secondary | ICD-10-CM

## 2019-09-27 LAB — CBC WITH DIFFERENTIAL (CANCER CENTER ONLY)
Abs Immature Granulocytes: 0.01 10*3/uL (ref 0.00–0.07)
Basophils Absolute: 0 10*3/uL (ref 0.0–0.1)
Basophils Relative: 1 %
Eosinophils Absolute: 0.1 10*3/uL (ref 0.0–0.5)
Eosinophils Relative: 2 %
HCT: 37.7 % (ref 36.0–46.0)
Hemoglobin: 12.4 g/dL (ref 12.0–15.0)
Immature Granulocytes: 0 %
Lymphocytes Relative: 12 %
Lymphs Abs: 0.5 10*3/uL — ABNORMAL LOW (ref 0.7–4.0)
MCH: 31.7 pg (ref 26.0–34.0)
MCHC: 32.9 g/dL (ref 30.0–36.0)
MCV: 96.4 fL (ref 80.0–100.0)
Monocytes Absolute: 0.4 10*3/uL (ref 0.1–1.0)
Monocytes Relative: 11 %
Neutro Abs: 2.8 10*3/uL (ref 1.7–7.7)
Neutrophils Relative %: 74 %
Platelet Count: 223 10*3/uL (ref 150–400)
RBC: 3.91 MIL/uL (ref 3.87–5.11)
RDW: 15.5 % (ref 11.5–15.5)
WBC Count: 3.8 10*3/uL — ABNORMAL LOW (ref 4.0–10.5)
nRBC: 0 % (ref 0.0–0.2)

## 2019-09-27 LAB — CMP (CANCER CENTER ONLY)
ALT: 34 U/L (ref 0–44)
AST: 29 U/L (ref 15–41)
Albumin: 3.9 g/dL (ref 3.5–5.0)
Alkaline Phosphatase: 88 U/L (ref 38–126)
Anion gap: 9 (ref 5–15)
BUN: 18 mg/dL (ref 6–20)
CO2: 30 mmol/L (ref 22–32)
Calcium: 8.6 mg/dL — ABNORMAL LOW (ref 8.9–10.3)
Chloride: 103 mmol/L (ref 98–111)
Creatinine: 0.71 mg/dL (ref 0.44–1.00)
GFR, Est AFR Am: >60 mL/min (ref >60)
GFR, Estimated: >60 mL/min (ref >60)
Glucose, Bld: 89 mg/dL (ref 70–99)
Potassium: 3.7 mmol/L (ref 3.5–5.1)
Sodium: 142 mmol/L (ref 135–145)
Total Bilirubin: 0.3 mg/dL (ref 0.3–1.2)
Total Protein: 6.7 g/dL (ref 6.5–8.1)

## 2019-09-27 MED ORDER — ACETAMINOPHEN 325 MG PO TABS
ORAL_TABLET | ORAL | Status: AC
  Filled 2019-09-27: qty 2

## 2019-09-27 MED ORDER — ACETAMINOPHEN 325 MG PO TABS
650.0000 mg | ORAL_TABLET | Freq: Once | ORAL | Status: AC
  Administered 2019-09-27: 650 mg via ORAL

## 2019-09-27 MED ORDER — SODIUM CHLORIDE 0.9% FLUSH
10.0000 mL | Freq: Once | INTRAVENOUS | Status: AC
  Administered 2019-09-27: 10 mL
  Filled 2019-09-27: qty 10

## 2019-09-27 MED ORDER — SODIUM CHLORIDE 0.9% FLUSH
10.0000 mL | INTRAVENOUS | Status: DC | PRN
  Administered 2019-09-27: 10 mL
  Filled 2019-09-27: qty 10

## 2019-09-27 MED ORDER — SODIUM CHLORIDE 0.9 % IV SOLN
Freq: Once | INTRAVENOUS | Status: AC
  Administered 2019-09-27: 11:00:00 via INTRAVENOUS
  Filled 2019-09-27: qty 250

## 2019-09-27 MED ORDER — HEPARIN SOD (PORK) LOCK FLUSH 100 UNIT/ML IV SOLN
500.0000 [IU] | Freq: Once | INTRAVENOUS | Status: AC | PRN
  Administered 2019-09-27: 13:00:00 500 [IU]
  Filled 2019-09-27: qty 5

## 2019-09-27 MED ORDER — DIPHENHYDRAMINE HCL 25 MG PO CAPS
ORAL_CAPSULE | ORAL | Status: AC
  Filled 2019-09-27: qty 2

## 2019-09-27 MED ORDER — SODIUM CHLORIDE 0.9 % IV SOLN
3.0000 mg/kg | Freq: Once | INTRAVENOUS | Status: AC
  Administered 2019-09-27: 160 mg via INTRAVENOUS
  Filled 2019-09-27: qty 8

## 2019-09-27 MED ORDER — DIPHENHYDRAMINE HCL 25 MG PO CAPS
50.0000 mg | ORAL_CAPSULE | Freq: Once | ORAL | Status: AC
  Administered 2019-09-27: 11:00:00 50 mg via ORAL

## 2019-09-27 NOTE — Patient Instructions (Signed)
[place] Discharge Instructions for Patients Receiving Chemotherapy  Today you received the following chemotherapy agents Ado-trastuzumab [facility]).  To help prevent nausea and vomiting after your treatment, we encourage you to take your nausea medication as prescribed.   If you develop nausea and vomiting that is not controlled by your nausea medication, call the clinic.   BELOW ARE SYMPTOMS THAT SHOULD BE REPORTED IMMEDIATELY:  *FEVER GREATER THAN 100.5 F  *CHILLS WITH OR WITHOUT FEVER  NAUSEA AND VOMITING THAT IS NOT CONTROLLED WITH YOUR NAUSEA MEDICATION  *UNUSUAL SHORTNESS OF BREATH  *UNUSUAL BRUISING OR BLEEDING  TENDERNESS IN MOUTH AND THROAT WITH OR WITHOUT PRESENCE OF ULCERS  *URINARY PROBLEMS  *BOWEL PROBLEMS  UNUSUAL RASH Items with * indicate a potential emergency and should be followed up as soon as possible.  Feel free to call the clinic should you have any questions or concerns. The clinic phone number is [phone].  Please show the [place] at check-in to the Emergency Department and triage nurse.  Influenza Virus Vaccine injection What is this medicine? INFLUENZA VIRUS VACCINE (in floo EN zuh VAHY ruhs vak SEEN) helps to reduce the risk of getting influenza also known as the flu. The vaccine only helps protect you against some strains of the flu. This medicine may be used for other purposes; ask your health care provider or pharmacist if you have questions. COMMON BRAND NAME(S): Afluria, Afluria Quadrivalent, Agriflu, Alfuria, FLUAD, Fluarix, Fluarix Quadrivalent, Flublok, Flublok Quadrivalent, FLUCELVAX, Flulaval, Fluvirin, Fluzone, Fluzone High-Dose, Fluzone Intradermal What should I tell my health care provider before I take this medicine? They need to know if you have any of these conditions:  bleeding disorder like hemophilia  fever or infection  Guillain-Barre syndrome or other neurological problems  immune system  problems  infection with the human immunodeficiency virus (HIV) or AIDS  low blood platelet counts  multiple sclerosis  an unusual or allergic reaction to influenza virus vaccine, latex, other medicines, foods, dyes, or preservatives. Different brands of vaccines contain different allergens. Some may contain latex or eggs. Talk to your doctor about your allergies to make sure that you get the right vaccine.  pregnant or trying to get pregnant  breast-feeding How should I use this medicine? This vaccine is for injection into a muscle or under the skin. It is given by a health care professional. A copy of Vaccine Information Statements will be given before each vaccination. Read this sheet carefully each time. The sheet may change frequently. Talk to your healthcare provider to see which vaccines are right for you. Some vaccines should not be used in all age groups. Overdosage: If you think you have taken too much of this medicine contact a poison control center or emergency room at once. NOTE: This medicine is only for you. Do not share this medicine with others. What if I miss a dose? This does not apply. What may interact with this medicine?  chemotherapy or radiation therapy  medicines that lower your immune system like etanercept, anakinra, infliximab, and adalimumab  medicines that treat or prevent blood clots like warfarin  phenytoin  steroid medicines like prednisone or cortisone  theophylline  vaccines This list may not describe all possible interactions. Give your health care provider a list of all the medicines, herbs, non-prescription drugs, or dietary supplements you use. Also tell them if you smoke, drink alcohol, or use illegal drugs. Some items may interact with your medicine. What should I watch for while using  this medicine? Report any side effects that do not go away within 3 days to your doctor or health care professional. Call your health care provider if any  unusual symptoms occur within 6 weeks of receiving this vaccine. You may still catch the flu, but the illness is not usually as bad. You cannot get the flu from the vaccine. The vaccine will not protect against colds or other illnesses that may cause fever. The vaccine is needed every year. What side effects may I notice from receiving this medicine? Side effects that you should report to your doctor or health care professional as soon as possible:  allergic reactions like skin rash, itching or hives, swelling of the face, lips, or tongue Side effects that usually do not require medical attention (report to your doctor or health care professional if they continue or are bothersome):  fever  headache  muscle aches and pains  pain, tenderness, redness, or swelling at the injection site  tiredness This list may not describe all possible side effects. Call your doctor for medical advice about side effects. You may report side effects to FDA at 1-800-FDA-1088. Where should I keep my medicine? The vaccine will be given by a health care professional in a clinic, pharmacy, doctor's office, or other health care setting. You will not be given vaccine doses to store at home. NOTE: This sheet is a summary. It may not cover all possible information. If you have questions about this medicine, talk to your doctor, pharmacist, or health care provider.  2020 Elsevier/Gold Standard (2018-09-05 08:45:43)  Coronavirus (COVID-19) Are you at risk?  Are you at risk for the Coronavirus (COVID-19)?  To be considered HIGH RISK for Coronavirus (COVID-19), you have to meet the following criteria:  . Traveled to [place], [place], [place], [place] or [place]; or in the [place] to [place], [place], [place], or [place]; and have fever, cough, and shortness of breath within the last 2 weeks of travel OR . Been in close contact with a person diagnosed with COVID-19 within the last 2 weeks and have fever,  cough, and shortness of breath . IF YOU DO NOT MEET THESE CRITERIA, YOU ARE CONSIDERED LOW RISK FOR COVID-19.  What to do if you are HIGH RISK for COVID-19?  Marland Kitchen If you are having a medical emergency, call 911. . Seek medical care right away. Before you go to a doctor's office, urgent care or emergency department, call ahead and tell them about your recent travel, contact with someone diagnosed with COVID-19, and your symptoms. You should receive instructions from your physician's office regarding next steps of care.  . When you arrive at healthcare provider, tell the healthcare staff immediately you have returned from visiting [place], [place], [place], [place] or [place]; or traveled in the [place] to [place], [place], [place], or [place]; in the last two weeks or you have been in close contact with a person diagnosed with COVID-19 in the last 2 weeks.   . Tell the health care staff about your symptoms: fever, cough and shortness of breath. . After you have been seen by a medical provider, you will be either: o Tested for (COVID-19) and discharged home on quarantine except to seek medical care if symptoms worsen, and asked to  - Stay home and avoid contact with others until you get your results (4-5 days)  - Avoid travel on public transportation if possible (such as bus, train, or airplane) or o [employer]  to the Emergency Department by EMS for evaluation, COVID-19 testing, and possible admission depending on your condition and test results.  What to do if you are LOW RISK for COVID-19?  Reduce your risk of any infection by using the same precautions used for avoiding the common cold or flu:  Marland Kitchen Wash your hands often with soap and warm water for at least 20 seconds.  If soap and water are not readily available, use an alcohol-based hand sanitizer with at least 60% alcohol.  . If coughing or sneezing, cover your mouth and nose by coughing or sneezing into the elbow areas of your shirt or coat,  into a tissue or into your sleeve (not your hands). . Avoid shaking hands with others and consider head nods or verbal greetings only. . Avoid touching your eyes, nose, or mouth with unwashed hands.  . Avoid close contact with people who are sick. . Avoid places or events with large numbers of people in one location, like concerts or sporting events. . Carefully consider travel plans you have or are making. . If you are planning any travel outside or inside the [place], visit the CDC's Travelers' Health webpage for the latest health notices. . If you have some symptoms but not all symptoms, continue to monitor at home and seek medical attention if your symptoms worsen. . If you are having a medical emergency, call 911.   [place] / e-Visit: [email or web address]         [facility]: [facility]: [phone]                   [facility]: [phone]

## 2019-09-27 NOTE — Assessment & Plan Note (Signed)
11/21/2018:Screening detected right breast mass upper outer quadrant, in addition to areas of calcifications which were not biopsied. By ultrasound she had 2 breast masses 12 o'clock position 2.9 cm: Grade 2 IDC with high-grade DCIS ER 100%, PR 70%, Ki-67 10%, HER-2 3+ by IHC and 11 o'clock position 1 cm, ER 90%, PR 50%, Ki-67 15%, HER-2 3+ by IHC, T2N0 stage Ib  Neoadjuvant chemotherapy with T[facility] Perjeta x6 cycles 12/12/2018-03/27/2019  04/30/2019:Right mastectomy: Grade 2 IDC, 3.9 cm, high-grade DCIS, margins are negative, 0/2 lymph nodes negative, ER 95%, PR 80%, HER-2 2+ equivocal, T2 N0  Treatment plan: 1.Adjuvant Kadcylastarted 05/24/2019 2.Adjuvant antiestrogen therapy with anastrozole 1 mg p.o. daily x7 yearsstarted 05/24/2019 ----------------------------------------------------------------------------------------------------------------------------- Current treatment: Cycle5Kadcyla maintenance(last Kadcyla to be given 11/29/2019) Labs reviewed Kadcyla toxicities: 1.Loss of appetite 2.Fatigue 3.Leukopenia probably from prior chemotherapy continue to monitor  I reducedthe dosage of Kadcylapreviously.  Low back pain with pain radiating down the leg: On Percocet 09/24/2019: MRI lumbar spine: Negative for metastatic disease.  Mild degenerative changes without disc protrusion or stenosis 09/24/2019: Bone scan: Nonspecific uptake right 11th rib  Echocardiogram will be done every 3 months No adverse effects to anastrozole therapy.  Return to clinic in 3 weeks forKadcyla treatment 

## 2019-09-27 NOTE — Patient Instructions (Signed)

## 2019-10-17 ENCOUNTER — Telehealth: Payer: Self-pay | Admitting: Hematology and Oncology

## 2019-10-17 NOTE — Telephone Encounter (Signed)
Returned patient's phone call regarding cancelling 12/24 appointments, per patient's request appointments have been cancelled.  Message to provider.

## 2019-10-18 ENCOUNTER — Other Ambulatory Visit: Payer: Medicare HMO

## 2019-10-18 ENCOUNTER — Ambulatory Visit: Payer: Medicare HMO | Admitting: Hematology and Oncology

## 2019-10-18 ENCOUNTER — Ambulatory Visit: Payer: Medicare HMO

## 2019-10-18 ENCOUNTER — Telehealth: Payer: Self-pay | Admitting: Hematology and Oncology

## 2019-10-18 NOTE — Telephone Encounter (Signed)
R/s appt per 12/23 sch message - pt is aware of apt date and time for 12/30 and 1/2

## 2019-10-24 ENCOUNTER — Ambulatory Visit: Payer: Medicare HMO | Admitting: Hematology and Oncology

## 2019-10-24 ENCOUNTER — Telehealth: Payer: Self-pay

## 2019-10-24 ENCOUNTER — Other Ambulatory Visit: Payer: Medicare HMO

## 2019-10-24 NOTE — Telephone Encounter (Signed)
Left voicemail for patient to return call.  Re: Missed appointments on 12/30.

## 2019-10-25 ENCOUNTER — Telehealth: Payer: Self-pay | Admitting: Hematology and Oncology

## 2019-10-25 ENCOUNTER — Telehealth: Payer: Self-pay | Admitting: Neurology

## 2019-10-25 DIAGNOSIS — G35 Multiple sclerosis: Secondary | ICD-10-CM

## 2019-10-25 MED ORDER — TECFIDERA 240 MG PO CPDR: DELAYED_RELEASE_CAPSULE | ORAL | 3 refills | Status: DC

## 2019-10-25 NOTE — Telephone Encounter (Signed)
Patient called on call physician, I have called in Tecfidera 240mg  bid to [facility] [address], 180 tabs with 3 refills.

## 2019-10-25 NOTE — Telephone Encounter (Signed)
Scheduled appt per 12/31 sch message - pt is aware of appt date and time

## 2019-10-27 ENCOUNTER — Inpatient Hospital Stay: Payer: Medicare HMO

## 2019-10-29 ENCOUNTER — Other Ambulatory Visit: Payer: Self-pay | Admitting: *Deleted

## 2019-10-29 DIAGNOSIS — G35 Multiple sclerosis: Secondary | ICD-10-CM

## 2019-10-29 MED ORDER — TECFIDERA 240 MG PO CPDR: DELAYED_RELEASE_CAPSULE | ORAL | 3 refills | Status: DC

## 2019-10-29 NOTE — Telephone Encounter (Signed)
Called pt back. She was not able to pick up rx d[facility]t being [facility]. [facility]. Pt will call back if she is not able to get her medication.

## 2019-10-30 NOTE — Progress Notes (Signed)
Patient Care Team: Neva Seat, MD as PCP - General (Internal Medicine) Excell Seltzer, MD (Inactive) as Consulting Physician (General Surgery) Nicholas Lose, MD as Consulting Physician (Hematology and Oncology) Kyung Rudd, MD as Consulting Physician (Radiation Oncology)  DIAGNOSIS:    ICD-10-CM   1. Malignant neoplasm of upper-outer quadrant of right breast in female, estrogen receptor positive ([place])  C50.411    Z17.0     SUMMARY OF ONCOLOGIC HISTORY: Oncology History  Malignant neoplasm of upper-outer quadrant of right breast in female, estrogen receptor positive ([place])  11/21/2018 Initial Diagnosis   Screening detected right breast mass upper outer quadrant, in addition to areas of calcifications which were not biopsied.  By ultrasound she had 2 breast masses 12 o'clock position 2.9 cm: Grade 2 IDC with high-grade DCIS ER 100%, PR 70%, Ki-67 10%, HER-2 3+ by IHC and 11 o'clock position 1 cm, ER 90%, PR 50%, Ki-67 15%, HER-2 3+ by IHC, T2N0 stage Ib   11/29/2018 Cancer Staging   Staging form: Breast, AJCC 8th Edition - Clinical stage from 11/29/2018: Stage IB (cT2, cN0, cM0, G3, ER+, PR+, HER2+) - Signed by Nicholas Lose, MD on 11/29/2018   12/12/2018 -  Neo-Adjuvant Chemotherapy   Neoadjuvant chemotherapy with [facility] Perjeta   01/18/2019 - 01/20/2019 Hospital Admission   Syncope   04/30/2019 Surgery   Right mastectomy: Grade 2 IDC, 3.9 cm, high-grade DCIS, margins are negative, 0/2 lymph nodes negative, ER 95%, PR 80%, HER-2 2+ equivocal, T2 N0   04/30/2019 Cancer Staging   Staging form: Breast, AJCC 8th Edition - Pathologic stage from 04/30/2019: No Stage Recommended (ypT2, pN0, cM0, G2, ER+, PR+, HER2-) - Signed by Gardenia Phlegm, NP on 05/09/2019   05/24/2019 -  Chemotherapy   The patient had ado-trastuzumab emtansine (KADCYLA) 200 mg in sodium chloride 0.9 % 250 mL chemo infusion, 240 mg, Intravenous, Once, 7 of 10 cycles Dose modification: 3 mg/kg (original dose 3.6  mg/kg, Cycle 5, Reason: Dose not tolerated) Administration: 200 mg (05/24/2019), 200 mg (06/14/2019), 160 mg (08/16/2019), 160 mg (09/06/2019), 160 mg (09/27/2019), 160 mg (07/05/2019), 160 mg (07/26/2019)  for chemotherapy treatment.    05/24/2019 -  Anti-estrogen oral therapy   Anastrozole 1 mg daily     CHIEF COMPLIANT: Follow-up ofKadcyla maintenance  INTERVAL HISTORY: Debbie Watts is a 59 y.o. with above-mentioned history of right breast cancer who completed neoadjuvant chemotherapy with TCH Perjetaand underwent a right mastectomy.Sheis currently on adjuvant treatment withKadcyla maintenanceand anti-estrogen therapy with anastrozole. She presents to the clinic todayfor treatment.  She is experiencing slight worsening of neuropathy in the right leg  ALLERGIES:  is allergic to procaine.  MEDICATIONS:  Current Outpatient Medications  Medication Sig Dispense Refill  . albuterol (VENTOLIN HFA) 108 (90 Base) MCG/ACT inhaler Inhale 2 puffs into the lungs every 6 (six) hours as needed for wheezing or shortness of breath.    . anastrozole (ARIMIDEX) 1 MG tablet Take 1 tablet (1 mg total) by mouth daily. 90 tablet 3  . aspirin-acetaminophen-caffeine (EXCEDRIN MIGRAINE) 250-250-65 MG tablet Take 2 tablets by mouth every 6 (six) hours as needed for migraine.    . betamethasone valerate lotion (VALISONE) 0.1 % Apply 1 application topically daily as needed for irritation.     . carbidopa-levodopa (SINEMET IR) 10-100 MG tablet Take 1 tablet by mouth at bedtime. 90 tablet 3  . diclofenac (VOLTAREN) 75 MG EC tablet Take 1 tablet (75 mg total) by mouth 2 (two) times daily. 60 tablet 5  .  diphenhydrAMINE (BENADRYL) 25 MG tablet Take 25 mg by mouth daily.    . fluticasone (FLONASE) 50 MCG/ACT nasal spray Place 2 sprays into both nostrils at bedtime.    . gabapentin (NEURONTIN) 300 MG capsule Take 2 capsules (600 mg total) by mouth 2 (two) times daily. 360 capsule 3  . lisinopril (ZESTRIL) 5 MG  tablet TAKE 1 TABLET BY MOUTH EVERY DAY 90 tablet 1  . methocarbamol (ROBAXIN) 500 MG tablet Take 1 tablet (500 mg total) by mouth every 8 (eight) hours as needed for muscle spasms. 20 tablet 1  . Multiple Vitamin (MULTIVITAMIN WITH MINERALS) TABS tablet Take 2 tablets by mouth daily.    Marland Kitchen omeprazole (PRILOSEC OTC) 20 MG tablet Take 20 mg by mouth daily as needed (acid reflux).    Marland Kitchen oxyCODONE-acetaminophen (PERCOCET/ROXICET) 5-325 MG tablet Take 1 tablet by mouth every 8 (eight) hours as needed for severe pain. 60 tablet 0  . PARoxetine (PAXIL) 20 MG tablet TAKE 1 TABLET BY MOUTH EVERY DAY 90 tablet 1  . TECFIDERA 240 MG CPDR Take 1 capsule by mouth twice daily 180 capsule 3  . traMADol (ULTRAM) 50 MG tablet Take 1 tablet (50 mg total) by mouth 2 (two) times daily as needed. 60 tablet 3   No current facility-administered medications for this visit.    PHYSICAL EXAMINATION: ECOG PERFORMANCE STATUS: 1 - Symptomatic but completely ambulatory  Vitals:   10/31/19 1113  BP: (!) 145/88  Pulse: 68  Resp: 18  Temp: 97.8 F (36.6 C)  SpO2: 100%   Filed Weights   10/31/19 1113  Weight: 119 lb 1.6 oz (54 kg)    LABORATORY DATA:  I have reviewed the data as listed CMP Latest Ref Rng & Units 09/27/2019 09/06/2019 08/16/2019  Glucose 70 - 99 mg/dL 89 82 94  BUN 6 - 20 mg/dL 18 22(H) 19  Creatinine 0.44 - 1.00 mg/dL 0.71 0.74 0.70  Sodium 135 - 145 mmol/L 142 144 140  Potassium 3.5 - 5.1 mmol/L 3.7 3.7 3.6  Chloride 98 - 111 mmol/L 103 106 103  CO2 22 - 32 mmol/L _0 Calcium 8.9 - 10.3 mg/dL 8.6(L) 8.8(L) 8.7(L)  Total Protein 6.5 - 8.1 g/dL 6.7 6.8 6.6  Total Bilirubin 0.3 - 1.2 mg/dL 0.3 0.4 0.5  Alkaline Phos 38 - 126 U/L 88 89 88  AST 15 - 41 U/L _1 ALT 0 - 44 U/L 34 32 29    Lab Results  Component Value Date   WBC 3.8 (L) 09/27/2019   HGB 12.4 09/27/2019   HCT 37.7 09/27/2019   MCV 96.4 09/27/2019   PLT 223 09/27/2019   NEUTROABS 2.8 09/27/2019    ASSESSMENT  & PLAN:  Malignant neoplasm of upper-outer quadrant of right breast in female, estrogen receptor positive ([place]) 11/21/2018:Screening detected right breast mass upper outer quadrant, in addition to areas of calcifications which were not biopsied. By ultrasound she had 2 breast masses 12 o'clock position 2.9 cm: Grade 2 IDC with high-grade DCIS ER 100%, PR 70%, Ki-67 10%, HER-2 3+ by IHC and 11 o'clock position 1 cm, ER 90%, PR 50%, Ki-67 15%, HER-2 3+ by IHC, T2N0 stage Ib  Neoadjuvant chemotherapy with TCH Perjeta x6 cycles 12/12/2018-03/27/2019  04/30/2019:Right mastectomy: Grade 2 IDC, 3.9 cm, high-grade DCIS, margins are negative, 0/2 lymph nodes negative, ER 95%, PR 80%, HER-2 2+ equivocal, T2 N0  Treatment plan: 1.Adjuvant Kadcylastarted 05/24/2019 2.Adjuvant antiestrogen therapy with anastrozole 1 mg p.o. daily x7  yearsstarted 05/24/2019 ----------------------------------------------------------------------------------------------------------------------------- Current treatment: Cycle8Kadcyla maintenance(last Kadcyla to be given 11/29/2019) Labs reviewed Kadcyla toxicities: 1.Loss of appetite: Much improved 2.Fatigue: Stable 3. Chemo-induced peripheral neuropathy: Probably related to MS but being monitored, slight worsening in the right leg reducedthe dosage of Kadcylapreviously.  Low back pain with pain radiating down the leg: On Percocet much better 09/24/2019: MRI lumbar spine: Negative for metastatic disease.  Mild degenerative changes without disc protrusion or stenosis 09/24/2019: Bone scan: Nonspecific uptake right 11th rib  Echocardiogram will be done every 3 months No adverse effects to anastrozole therapy.:  Denies any hot flashes or arthralgias or myalgias She has 2 more doses of Kadcyla left. Return to clinic in 3 weeks forKadcyla treatment    No orders of the defined types were placed in this encounter.  The patient has a good understanding of the  overall plan. she agrees with it. she will call with any problems that may develop before the next visit here.  Total time spent: 30 mins including face to face time and time spent for planning, charting and coordination of care  Nicholas Lose, MD 10/31/2019  I, Cloyde Reams Dorshimer, am acting as scribe for Dr. Nicholas Lose.  I have reviewed the above documentation for accuracy and completeness, and I agree with the above.

## 2019-10-31 ENCOUNTER — Inpatient Hospital Stay: Payer: Medicare HMO | Attending: Hematology and Oncology

## 2019-10-31 ENCOUNTER — Other Ambulatory Visit: Payer: Self-pay

## 2019-10-31 ENCOUNTER — Inpatient Hospital Stay (HOSPITAL_BASED_OUTPATIENT_CLINIC_OR_DEPARTMENT_OTHER): Payer: Medicare HMO | Admitting: Hematology and Oncology

## 2019-10-31 ENCOUNTER — Inpatient Hospital Stay: Payer: Medicare HMO

## 2019-10-31 DIAGNOSIS — Z170 Estrogen receptor positive status [ER+]: Secondary | ICD-10-CM | POA: Insufficient documentation

## 2019-10-31 DIAGNOSIS — Z7982 Long term (current) use of aspirin: Secondary | ICD-10-CM | POA: Diagnosis not present

## 2019-10-31 DIAGNOSIS — C50411 Malignant neoplasm of upper-outer quadrant of right female breast: Secondary | ICD-10-CM

## 2019-10-31 DIAGNOSIS — Z9011 Acquired absence of right breast and nipple: Secondary | ICD-10-CM | POA: Insufficient documentation

## 2019-10-31 DIAGNOSIS — T451X5D Adverse effect of antineoplastic and immunosuppressive drugs, subsequent encounter: Secondary | ICD-10-CM | POA: Diagnosis not present

## 2019-10-31 DIAGNOSIS — Z791 Long term (current) use of non-steroidal anti-inflammatories (NSAID): Secondary | ICD-10-CM | POA: Insufficient documentation

## 2019-10-31 DIAGNOSIS — Z923 Personal history of irradiation: Secondary | ICD-10-CM | POA: Diagnosis not present

## 2019-10-31 DIAGNOSIS — G620 Drug-induced polyneuropathy: Secondary | ICD-10-CM | POA: Insufficient documentation

## 2019-10-31 DIAGNOSIS — Z9221 Personal history of antineoplastic chemotherapy: Secondary | ICD-10-CM | POA: Insufficient documentation

## 2019-10-31 DIAGNOSIS — M545 Low back pain: Secondary | ICD-10-CM | POA: Diagnosis not present

## 2019-10-31 DIAGNOSIS — Z95828 Presence of other vascular implants and grafts: Secondary | ICD-10-CM

## 2019-10-31 DIAGNOSIS — Z79899 Other long term (current) drug therapy: Secondary | ICD-10-CM | POA: Insufficient documentation

## 2019-10-31 DIAGNOSIS — Z79811 Long term (current) use of aromatase inhibitors: Secondary | ICD-10-CM | POA: Diagnosis not present

## 2019-10-31 DIAGNOSIS — Z5112 Encounter for antineoplastic immunotherapy: Secondary | ICD-10-CM | POA: Diagnosis not present

## 2019-10-31 LAB — CMP (CANCER CENTER ONLY)
ALT: 30 U/L (ref 0–44)
AST: 26 U/L (ref 15–41)
Albumin: 4.1 g/dL (ref 3.5–5.0)
Alkaline Phosphatase: 104 U/L (ref 38–126)
Anion gap: 9 (ref 5–15)
BUN: 24 mg/dL — ABNORMAL HIGH (ref 6–20)
CO2: 27 mmol/L (ref 22–32)
Calcium: 8.2 mg/dL — ABNORMAL LOW (ref 8.9–10.3)
Chloride: 105 mmol/L (ref 98–111)
Creatinine: 0.72 mg/dL (ref 0.44–1.00)
GFR, Est AFR Am: >60 mL/min (ref >60)
GFR, Estimated: >60 mL/min (ref >60)
Glucose, Bld: 84 mg/dL (ref 70–99)
Potassium: 3.9 mmol/L (ref 3.5–5.1)
Sodium: 141 mmol/L (ref 135–145)
Total Bilirubin: 0.3 mg/dL (ref 0.3–1.2)
Total Protein: 7 g/dL (ref 6.5–8.1)

## 2019-10-31 LAB — CBC WITH DIFFERENTIAL (CANCER CENTER ONLY)
Abs Immature Granulocytes: 0.02 10*3/uL (ref 0.00–0.07)
Basophils Absolute: 0.1 10*3/uL (ref 0.0–0.1)
Basophils Relative: 2 %
Eosinophils Absolute: 0.1 10*3/uL (ref 0.0–0.5)
Eosinophils Relative: 2 %
HCT: 40.9 % (ref 36.0–46.0)
Hemoglobin: 13.6 g/dL (ref 12.0–15.0)
Immature Granulocytes: 0 %
Lymphocytes Relative: 18 %
Lymphs Abs: 0.8 10*3/uL (ref 0.7–4.0)
MCH: 31.6 pg (ref 26.0–34.0)
MCHC: 33.3 g/dL (ref 30.0–36.0)
MCV: 94.9 fL (ref 80.0–100.0)
Monocytes Absolute: 0.5 10*3/uL (ref 0.1–1.0)
Monocytes Relative: 10 %
Neutro Abs: 3.1 10*3/uL (ref 1.7–7.7)
Neutrophils Relative %: 68 %
Platelet Count: 243 10*3/uL (ref 150–400)
RBC: 4.31 MIL/uL (ref 3.87–5.11)
RDW: 13.8 % (ref 11.5–15.5)
WBC Count: 4.5 10*3/uL (ref 4.0–10.5)
nRBC: 0 % (ref 0.0–0.2)

## 2019-10-31 MED ORDER — HEPARIN SOD (PORK) LOCK FLUSH 100 UNIT/ML IV SOLN
500.0000 [IU] | Freq: Once | INTRAVENOUS | Status: AC | PRN
  Administered 2019-10-31: 500 [IU]
  Filled 2019-10-31: qty 5

## 2019-10-31 MED ORDER — DIPHENHYDRAMINE HCL 25 MG PO CAPS
50.0000 mg | ORAL_CAPSULE | Freq: Once | ORAL | Status: AC
  Administered 2019-10-31: 50 mg via ORAL

## 2019-10-31 MED ORDER — OXYCODONE-ACETAMINOPHEN 5-325 MG PO TABS: 1.0000 | ORAL_TABLET | Freq: Three times a day (TID) | ORAL | 0 refills | Status: DC | PRN

## 2019-10-31 MED ORDER — SODIUM CHLORIDE 0.9 % IV SOLN
Freq: Once | INTRAVENOUS | Status: AC
  Filled 2019-10-31: qty 250

## 2019-10-31 MED ORDER — ACETAMINOPHEN 325 MG PO TABS
650.0000 mg | ORAL_TABLET | Freq: Once | ORAL | Status: AC
  Administered 2019-10-31: 650 mg via ORAL

## 2019-10-31 MED ORDER — SODIUM CHLORIDE 0.9 % IV SOLN
3.0000 mg/kg | Freq: Once | INTRAVENOUS | Status: AC
  Administered 2019-10-31: 160 mg via INTRAVENOUS
  Filled 2019-10-31: qty 8

## 2019-10-31 MED ORDER — DIPHENHYDRAMINE HCL 25 MG PO CAPS
ORAL_CAPSULE | ORAL | Status: AC
  Filled 2019-10-31: qty 2

## 2019-10-31 MED ORDER — SODIUM CHLORIDE 0.9% FLUSH
10.0000 mL | Freq: Once | INTRAVENOUS | Status: AC
  Administered 2019-10-31: 10 mL
  Filled 2019-10-31: qty 10

## 2019-10-31 MED ORDER — ACETAMINOPHEN 325 MG PO TABS
ORAL_TABLET | ORAL | Status: AC
  Filled 2019-10-31: qty 2

## 2019-10-31 MED ORDER — SODIUM CHLORIDE 0.9% FLUSH
10.0000 mL | INTRAVENOUS | Status: DC | PRN
  Administered 2019-10-31: 10 mL
  Filled 2019-10-31: qty 10

## 2019-10-31 NOTE — Assessment & Plan Note (Signed)
11/21/2018:Screening detected right breast mass upper outer quadrant, in addition to areas of calcifications which were not biopsied. By ultrasound she had 2 breast masses 12 o'clock position 2.9 cm: Grade 2 IDC with high-grade DCIS ER 100%, PR 70%, Ki-67 10%, HER-2 3+ by IHC and 11 o'clock position 1 cm, ER 90%, PR 50%, Ki-67 15%, HER-2 3+ by IHC, T2N0 stage Ib  Neoadjuvant chemotherapy with TCH Perjeta x6 cycles 12/12/2018-03/27/2019  04/30/2019:Right mastectomy: Grade 2 IDC, 3.9 cm, high-grade DCIS, margins are negative, 0/2 lymph nodes negative, ER 95%, PR 80%, HER-2 2+ equivocal, T2 N0  Treatment plan: 1.Adjuvant Kadcylastarted 05/24/2019 2.Adjuvant antiestrogen therapy with anastrozole 1 mg p.o. daily x7 yearsstarted 05/24/2019 ----------------------------------------------------------------------------------------------------------------------------- Current treatment: Cycle5Kadcyla maintenance(last Kadcyla to be given 11/29/2019) Labs reviewed Kadcyla toxicities: 1.Loss of appetite: Much improved 2.Fatigue: Stable Chemo-induced peripheral neuropathy: Probably related to MS but being monitored  reducedthe dosage of Kadcylapreviously.  Low back pain with pain radiating down the leg: On Percocet much better 09/24/2019: MRI lumbar spine: Negative for metastatic disease.  Mild degenerative changes without disc protrusion or stenosis 09/24/2019: Bone scan: Nonspecific uptake right 11th rib  Echocardiogram will be done every 3 months No adverse effects to anastrozole therapy.:  Denies any hot flashes or arthralgias or myalgias  Return to clinic in 3 weeks forKadcyla treatment

## 2019-10-31 NOTE — Patient Instructions (Signed)
[facility] Discharge Instructions for Patients Receiving Chemotherapy  Today you received the following chemotherapy agents Kadcyla  To help prevent nausea and vomiting after your treatment, we encourage you to take your nausea medication as directed   If you develop nausea and vomiting that is not controlled by your nausea medication, call the clinic.   BELOW ARE SYMPTOMS THAT SHOULD BE REPORTED IMMEDIATELY:  *FEVER GREATER THAN 100.5 F  *CHILLS WITH OR WITHOUT FEVER  NAUSEA AND VOMITING THAT IS NOT CONTROLLED WITH YOUR NAUSEA MEDICATION  *UNUSUAL SHORTNESS OF BREATH  *UNUSUAL BRUISING OR BLEEDING  TENDERNESS IN MOUTH AND THROAT WITH OR WITHOUT PRESENCE OF ULCERS  *URINARY PROBLEMS  *BOWEL PROBLEMS  UNUSUAL RASH Items with * indicate a potential emergency and should be followed up as soon as possible.  Feel free to call the clinic should you have any questions or concerns. The clinic phone number is [phone].  Please show the CHEMO ALERT CARD at check-in to the Emergency Department and triage nurse.   

## 2019-11-05 ENCOUNTER — Other Ambulatory Visit: Payer: Self-pay | Admitting: Hematology and Oncology

## 2019-11-05 DIAGNOSIS — Z853 Personal history of malignant neoplasm of breast: Secondary | ICD-10-CM

## 2019-11-08 ENCOUNTER — Ambulatory Visit: Payer: Medicare HMO | Admitting: Hematology and Oncology

## 2019-11-08 ENCOUNTER — Other Ambulatory Visit: Payer: Medicare HMO

## 2019-11-08 ENCOUNTER — Ambulatory Visit: Payer: Medicare HMO

## 2019-11-20 NOTE — Progress Notes (Signed)
Patient Care Team: Neva Seat, MD as PCP - General (Internal Medicine) Excell Seltzer, MD (Inactive) as Consulting Physician (General Surgery) Nicholas Lose, MD as Consulting Physician (Hematology and Oncology) Kyung Rudd, MD as Consulting Physician (Radiation Oncology)  DIAGNOSIS:    ICD-10-CM   1. Malignant neoplasm of upper-outer quadrant of right breast in female, estrogen receptor positive ([place])  C50.411    Z17.0     SUMMARY OF ONCOLOGIC HISTORY: Oncology History  Malignant neoplasm of upper-outer quadrant of right breast in female, estrogen receptor positive ([place])  11/21/2018 Initial Diagnosis   Screening detected right breast mass upper outer quadrant, in addition to areas of calcifications which were not biopsied.  By ultrasound she had 2 breast masses 12 o'clock position 2.9 cm: Grade 2 IDC with high-grade DCIS ER 100%, PR 70%, Ki-67 10%, HER-2 3+ by IHC and 11 o'clock position 1 cm, ER 90%, PR 50%, Ki-67 15%, HER-2 3+ by IHC, T2N0 stage Ib   11/29/2018 Cancer Staging   Staging form: Breast, AJCC 8th Edition - Clinical stage from 11/29/2018: Stage IB (cT2, cN0, cM0, G3, ER+, PR+, HER2+) - Signed by Nicholas Lose, MD on 11/29/2018   12/12/2018 -  Neo-Adjuvant Chemotherapy   Neoadjuvant chemotherapy with [facility]eta   01/18/2019 - 01/20/2019 Hospital Admission   Syncope   04/30/2019 Surgery   Right mastectomy: Grade 2 IDC, 3.9 cm, high-grade DCIS, margins are negative, 0/2 lymph nodes negative, ER 95%, PR 80%, HER-2 2+ equivocal, T2 N0   04/30/2019 Cancer Staging   Staging form: Breast, AJCC 8th Edition - Pathologic stage from 04/30/2019: No Stage Recommended (ypT2, pN0, cM0, G2, ER+, PR+, HER2-) - Signed by Gardenia Phlegm, NP on 05/09/2019   05/24/2019 -  Chemotherapy   The patient had ado-trastuzumab emtansine (KADCYLA) 200 mg in sodium chloride 0.9 % 250 mL chemo infusion, 240 mg, Intravenous, Once, 8 of 10 cycles Dose modification: 3 mg/kg (original dose 3.6  mg/kg, Cycle 5, Reason: Dose not tolerated) Administration: 200 mg (05/24/2019), 200 mg (06/14/2019), 160 mg (08/16/2019), 160 mg (09/06/2019), 160 mg (09/27/2019), 160 mg (10/31/2019), 160 mg (07/05/2019), 160 mg (07/26/2019)  for chemotherapy treatment.    05/24/2019 -  Anti-estrogen oral therapy   Anastrozole 1 mg daily     CHIEF COMPLIANT: Follow-up ofKadcyla maintenance  INTERVAL HISTORY: Debbie Watts is a 59 y.o. with above-mentioned history of right breast cancer who completed neoadjuvant chemotherapy with [facility]and underwent a right mastectomy.Sheis currently on adjuvant treatment withKadcyla maintenanceand anti-estrogen therapy with anastrozole.She presents to the clinic todayfor treatment.  Her main complaint today is the left shoulder discomfort and decreased range of motion.  ALLERGIES:  is allergic to procaine.  MEDICATIONS:  Current Outpatient Medications  Medication Sig Dispense Refill  . albuterol (VENTOLIN HFA) 108 (90 Base) MCG/ACT inhaler Inhale 2 puffs into the lungs every 6 (six) hours as needed for wheezing or shortness of breath.    . anastrozole (ARIMIDEX) 1 MG tablet Take 1 tablet (1 mg total) by mouth daily. 90 tablet 3  . aspirin-acetaminophen-caffeine (EXCEDRIN MIGRAINE) 250-250-65 MG tablet Take 2 tablets by mouth every 6 (six) hours as needed for migraine.    . betamethasone valerate lotion (VALISONE) 0.1 % Apply 1 application topically daily as needed for irritation.     . carbidopa-levodopa (SINEMET IR) 10-100 MG tablet Take 1 tablet by mouth at bedtime. 90 tablet 3  . diclofenac (VOLTAREN) 75 MG EC tablet Take 1 tablet (75 mg total) by mouth 2 (two) times daily.  60 tablet 5  . diphenhydrAMINE (BENADRYL) 25 MG tablet Take 25 mg by mouth daily.    . fluticasone (FLONASE) 50 MCG/ACT nasal spray Place 2 sprays into both nostrils at bedtime.    . gabapentin (NEURONTIN) 300 MG capsule Take 2 capsules (600 mg total) by mouth 2 (two) times daily. 360  capsule 3  . lisinopril (ZESTRIL) 5 MG tablet TAKE 1 TABLET BY MOUTH EVERY DAY 90 tablet 1  . methocarbamol (ROBAXIN) 500 MG tablet Take 1 tablet (500 mg total) by mouth every 8 (eight) hours as needed for muscle spasms. 20 tablet 1  . Multiple Vitamin (MULTIVITAMIN WITH MINERALS) TABS tablet Take 2 tablets by mouth daily.    Marland Kitchen omeprazole (PRILOSEC OTC) 20 MG tablet Take 20 mg by mouth daily as needed (acid reflux).    Marland Kitchen oxyCODONE-acetaminophen (PERCOCET/ROXICET) 5-325 MG tablet Take 1 tablet by mouth every 8 (eight) hours as needed for severe pain. 60 tablet 0  . PARoxetine (PAXIL) 20 MG tablet TAKE 1 TABLET BY MOUTH EVERY DAY 90 tablet 1  . TECFIDERA 240 MG CPDR Take 1 capsule by mouth twice daily 180 capsule 3  . traMADol (ULTRAM) 50 MG tablet Take 1 tablet (50 mg total) by mouth 2 (two) times daily as needed. 60 tablet 3   No current facility-administered medications for this visit.    PHYSICAL EXAMINATION: ECOG PERFORMANCE STATUS: 1 - Symptomatic but completely ambulatory  Vitals:   11/21/19 0847  BP: 133/78  Pulse: 68  Resp: 17  Temp: (!) 97.3 F (36.3 C)  SpO2: 99%   Filed Weights   11/21/19 0847  Weight: 121 lb 4.8 oz (55 kg)    LABORATORY DATA:  I have reviewed the data as listed CMP Latest Ref Rng & Units 10/31/2019 09/27/2019 09/06/2019  Glucose 70 - 99 mg/dL 84 89 82  BUN 6 - 20 mg/dL 24(H) 18 22(H)  Creatinine 0.44 - 1.00 mg/dL 0.72 0.71 0.74  Sodium 135 - 145 mmol/L 141 142 144  Potassium 3.5 - 5.1 mmol/L 3.9 3.7 3.7  Chloride 98 - 111 mmol/L 105 103 106  CO2 22 - 32 mmol/L '27 30 28  ' Calcium 8.9 - 10.3 mg/dL 8.2(L) 8.6(L) 8.8(L)  Total Protein 6.5 - 8.1 g/dL 7.0 6.7 6.8  Total Bilirubin 0.3 - 1.2 mg/dL 0.3 0.3 0.4  Alkaline Phos 38 - 126 U/L 104 88 89  AST 15 - 41 U/L '26 29 31  ' ALT 0 - 44 U/L 30 34 32    Lab Results  Component Value Date   WBC 3.6 (L) 11/21/2019   HGB 13.0 11/21/2019   HCT 39.1 11/21/2019   MCV 94.2 11/21/2019   PLT 205 11/21/2019    NEUTROABS 2.4 11/21/2019    ASSESSMENT & PLAN:  Malignant neoplasm of upper-outer quadrant of right breast in female, estrogen receptor positive ([place]) 11/21/2018:Screening detected right breast mass upper outer quadrant, in addition to areas of calcifications which were not biopsied. By ultrasound she had 2 breast masses 12 o'clock position 2.9 cm: Grade 2 IDC with high-grade DCIS ER 100%, PR 70%, Ki-67 10%, HER-2 3+ by IHC and 11 o'clock position 1 cm, ER 90%, PR 50%, Ki-67 15%, HER-2 3+ by IHC, T2N0 stage Ib  Neoadjuvant chemotherapy with [facility] x6 cycles 12/12/2018-03/27/2019  04/30/2019:Right mastectomy: Grade 2 IDC, 3.9 cm, high-grade DCIS, margins are negative, 0/2 lymph nodes negative, ER 95%, PR 80%, HER-2 2+ equivocal, T2 N0  Treatment plan: 1.Adjuvant Kadcylastarted 05/24/2019 2.Adjuvant antiestrogen therapy with anastrozole  1 mg p.o. daily x7 yearsstarted 05/24/2019 ----------------------------------------------------------------------------------------------------------------------------- Current treatment: Cycle9Kadcyla maintenance(last Kadcyla to be given 11/29/2019) Labs reviewed Kadcyla toxicities: 1.Loss of appetite: Doing much better 2.Fatigue: No worsening fatigue stable 3. Chemo-induced peripheral neuropathy: Probably related to MS but being monitored, reduce the dosage of Kadcyla for  Low back pain with pain radiating down the leg:On Percocets 09/24/2019: MRI lumbar spine: Negative for metastatic disease. Mild degenerative changes withoutdisc protrusion or stenosis 09/24/2019: Bone scan: Nonspecific uptake right 11th rib  Echocardiogram will be done every 3 months Anastrozole toxicities: Denies any hot flashes or myalgias. She has 1 more dose of Kadcyla left.  Decreased range of motion in the left shoulder: Physical therapy consult was placed today. Return to clinic in 3 weeks forher last Kadcyla treatment    No orders of the defined types  were placed in this encounter.  The patient has a good understanding of the overall plan. she agrees with it. she will call with any problems that may develop before the next visit here.  Total time spent: 30 mins including face to face time and time spent for planning, charting and coordination of care  Nicholas Lose, MD 11/21/2019  I, Cloyde Reams Dorshimer, am acting as scribe for Dr. Nicholas Lose.  I have reviewed the above documentation for accuracy and completeness, and I agree with the above.

## 2019-11-21 ENCOUNTER — Telehealth: Payer: Self-pay | Admitting: *Deleted

## 2019-11-21 ENCOUNTER — Inpatient Hospital Stay: Payer: Medicare HMO

## 2019-11-21 ENCOUNTER — Other Ambulatory Visit: Payer: Self-pay

## 2019-11-21 ENCOUNTER — Inpatient Hospital Stay: Payer: Medicare HMO | Admitting: Hematology and Oncology

## 2019-11-21 VITALS — BP 143/77 | HR 61 | Resp 18

## 2019-11-21 DIAGNOSIS — Z9221 Personal history of antineoplastic chemotherapy: Secondary | ICD-10-CM | POA: Diagnosis not present

## 2019-11-21 DIAGNOSIS — Z170 Estrogen receptor positive status [ER+]: Secondary | ICD-10-CM

## 2019-11-21 DIAGNOSIS — Z79811 Long term (current) use of aromatase inhibitors: Secondary | ICD-10-CM | POA: Diagnosis not present

## 2019-11-21 DIAGNOSIS — C50411 Malignant neoplasm of upper-outer quadrant of right female breast: Secondary | ICD-10-CM

## 2019-11-21 DIAGNOSIS — G620 Drug-induced polyneuropathy: Secondary | ICD-10-CM | POA: Diagnosis not present

## 2019-11-21 DIAGNOSIS — M545 Low back pain: Secondary | ICD-10-CM | POA: Diagnosis not present

## 2019-11-21 DIAGNOSIS — Z95828 Presence of other vascular implants and grafts: Secondary | ICD-10-CM

## 2019-11-21 DIAGNOSIS — T451X5D Adverse effect of antineoplastic and immunosuppressive drugs, subsequent encounter: Secondary | ICD-10-CM | POA: Diagnosis not present

## 2019-11-21 DIAGNOSIS — Z5112 Encounter for antineoplastic immunotherapy: Secondary | ICD-10-CM | POA: Diagnosis not present

## 2019-11-21 DIAGNOSIS — Z923 Personal history of irradiation: Secondary | ICD-10-CM | POA: Diagnosis not present

## 2019-11-21 LAB — CBC WITH DIFFERENTIAL (CANCER CENTER ONLY)
Abs Immature Granulocytes: 0.01 10*3/uL (ref 0.00–0.07)
Basophils Absolute: 0 10*3/uL (ref 0.0–0.1)
Basophils Relative: 1 %
Eosinophils Absolute: 0.1 10*3/uL (ref 0.0–0.5)
Eosinophils Relative: 2 %
HCT: 39.1 % (ref 36.0–46.0)
Hemoglobin: 13 g/dL (ref 12.0–15.0)
Immature Granulocytes: 0 %
Lymphocytes Relative: 18 %
Lymphs Abs: 0.7 10*3/uL (ref 0.7–4.0)
MCH: 31.3 pg (ref 26.0–34.0)
MCHC: 33.2 g/dL (ref 30.0–36.0)
MCV: 94.2 fL (ref 80.0–100.0)
Monocytes Absolute: 0.5 10*3/uL (ref 0.1–1.0)
Monocytes Relative: 13 %
Neutro Abs: 2.4 10*3/uL (ref 1.7–7.7)
Neutrophils Relative %: 66 %
Platelet Count: 205 10*3/uL (ref 150–400)
RBC: 4.15 MIL/uL (ref 3.87–5.11)
RDW: 14.2 % (ref 11.5–15.5)
WBC Count: 3.6 10*3/uL — ABNORMAL LOW (ref 4.0–10.5)
nRBC: 0 % (ref 0.0–0.2)

## 2019-11-21 LAB — CMP (CANCER CENTER ONLY)
ALT: 28 U/L (ref 0–44)
AST: 24 U/L (ref 15–41)
Albumin: 3.9 g/dL (ref 3.5–5.0)
Alkaline Phosphatase: 89 U/L (ref 38–126)
Anion gap: 10 (ref 5–15)
BUN: 18 mg/dL (ref 6–20)
CO2: 28 mmol/L (ref 22–32)
Calcium: 8.4 mg/dL — ABNORMAL LOW (ref 8.9–10.3)
Chloride: 105 mmol/L (ref 98–111)
Creatinine: 0.74 mg/dL (ref 0.44–1.00)
GFR, Est AFR Am: >60 mL/min (ref >60)
GFR, Estimated: >60 mL/min (ref >60)
Glucose, Bld: 92 mg/dL (ref 70–99)
Potassium: 3.6 mmol/L (ref 3.5–5.1)
Sodium: 143 mmol/L (ref 135–145)
Total Bilirubin: 0.4 mg/dL (ref 0.3–1.2)
Total Protein: 6.5 g/dL (ref 6.5–8.1)

## 2019-11-21 MED ORDER — SODIUM CHLORIDE 0.9% FLUSH
10.0000 mL | INTRAVENOUS | Status: DC | PRN
  Administered 2019-11-21: 10 mL
  Filled 2019-11-21: qty 10

## 2019-11-21 MED ORDER — HEPARIN SOD (PORK) LOCK FLUSH 100 UNIT/ML IV SOLN
500.0000 [IU] | Freq: Once | INTRAVENOUS | Status: AC | PRN
  Administered 2019-11-21: 500 [IU]
  Filled 2019-11-21: qty 5

## 2019-11-21 MED ORDER — DIPHENHYDRAMINE HCL 25 MG PO CAPS
50.0000 mg | ORAL_CAPSULE | Freq: Once | ORAL | Status: AC
  Administered 2019-11-21: 50 mg via ORAL

## 2019-11-21 MED ORDER — SODIUM CHLORIDE 0.9 % IV SOLN
Freq: Once | INTRAVENOUS | Status: AC
  Filled 2019-11-21: qty 250

## 2019-11-21 MED ORDER — ACETAMINOPHEN 325 MG PO TABS
ORAL_TABLET | ORAL | Status: AC
  Filled 2019-11-21: qty 2

## 2019-11-21 MED ORDER — SODIUM CHLORIDE 0.9% FLUSH
10.0000 mL | Freq: Once | INTRAVENOUS | Status: AC
  Administered 2019-11-21: 10 mL
  Filled 2019-11-21: qty 10

## 2019-11-21 MED ORDER — SODIUM CHLORIDE 0.9 % IV SOLN
3.0000 mg/kg | Freq: Once | INTRAVENOUS | Status: AC
  Administered 2019-11-21: 160 mg via INTRAVENOUS
  Filled 2019-11-21: qty 8

## 2019-11-21 MED ORDER — DIPHENHYDRAMINE HCL 25 MG PO CAPS
ORAL_CAPSULE | ORAL | Status: AC
  Filled 2019-11-21: qty 2

## 2019-11-21 MED ORDER — ACETAMINOPHEN 325 MG PO TABS
650.0000 mg | ORAL_TABLET | Freq: Once | ORAL | Status: AC
  Administered 2019-11-21: 650 mg via ORAL

## 2019-11-21 NOTE — Telephone Encounter (Signed)
Received call from pt stating that someone she was in close contact with over the weekend tested positive for Covid 19 today.  Pt worried because she came to the cancer center today for treatment.  Pt states she kept her mask on the entire time and denies any symptoms of Covid 19 but states she is going to get tested tomorrow for peace of mind.  RN educated pt on Covid 19 precautions and to notify the office when her results come back.  Pt verbalized understanding.

## 2019-11-21 NOTE — Patient Instructions (Signed)
[facility] Discharge Instructions for Patients Receiving Chemotherapy  Today you received the following chemotherapy agents Kadcyla  To help prevent nausea and vomiting after your treatment, we encourage you to take your nausea medication as directed   If you develop nausea and vomiting that is not controlled by your nausea medication, call the clinic.   BELOW ARE SYMPTOMS THAT SHOULD BE REPORTED IMMEDIATELY:  *FEVER GREATER THAN 100.5 F  *CHILLS WITH OR WITHOUT FEVER  NAUSEA AND VOMITING THAT IS NOT CONTROLLED WITH YOUR NAUSEA MEDICATION  *UNUSUAL SHORTNESS OF BREATH  *UNUSUAL BRUISING OR BLEEDING  TENDERNESS IN MOUTH AND THROAT WITH OR WITHOUT PRESENCE OF ULCERS  *URINARY PROBLEMS  *BOWEL PROBLEMS  UNUSUAL RASH Items with * indicate a potential emergency and should be followed up as soon as possible.  Feel free to call the clinic should you have any questions or concerns. The clinic phone number is [phone].  Please show the CHEMO ALERT CARD at check-in to the Emergency Department and triage nurse.   

## 2019-11-21 NOTE — Assessment & Plan Note (Signed)
11/21/2018:Screening detected right breast mass upper outer quadrant, in addition to areas of calcifications which were not biopsied. By ultrasound she had 2 breast masses 12 o'clock position 2.9 cm: Grade 2 IDC with high-grade DCIS ER 100%, PR 70%, Ki-67 10%, HER-2 3+ by IHC and 11 o'clock position 1 cm, ER 90%, PR 50%, Ki-67 15%, HER-2 3+ by IHC, T2N0 stage Ib  Neoadjuvant chemotherapy with [facility] x6 cycles 12/12/2018-03/27/2019  04/30/2019:Right mastectomy: Grade 2 IDC, 3.9 cm, high-grade DCIS, margins are negative, 0/2 lymph nodes negative, ER 95%, PR 80%, HER-2 2+ equivocal, T2 N0  Treatment plan: 1.Adjuvant Kadcylastarted 05/24/2019 2.Adjuvant antiestrogen therapy with anastrozole 1 mg p.o. daily x7 yearsstarted 05/24/2019 ----------------------------------------------------------------------------------------------------------------------------- Current treatment: Cycle9Kadcyla maintenance(last Kadcyla to be given 11/29/2019) Labs reviewed Kadcyla toxicities: 1.Loss of appetite: Doing much better 2.Fatigue: No worsening fatigue stable 3. Chemo-induced peripheral neuropathy: Probably related to MS but being monitored, reduce the dosage of Kadcyla for  Low back pain with pain radiating down the leg:On Percocets 09/24/2019: MRI lumbar spine: Negative for metastatic disease. Mild degenerative changes withoutdisc protrusion or stenosis 09/24/2019: Bone scan: Nonspecific uptake right 11th rib  Echocardiogram will be done every 3 months Anastrozole toxicities: Denies any hot flashes or myalgias. She has 1 more dose of Kadcyla left. Return to clinic in 3 weeks forher last Kadcyla treatment

## 2019-11-22 ENCOUNTER — Ambulatory Visit: Payer: Medicare HMO | Attending: Internal Medicine

## 2019-11-22 DIAGNOSIS — Z20822 Contact with and (suspected) exposure to covid-19: Secondary | ICD-10-CM | POA: Diagnosis not present

## 2019-11-23 ENCOUNTER — Ambulatory Visit
Admission: RE | Admit: 2019-11-23 | Discharge: 2019-11-23 | Disposition: A | Discharge status: Home / Self Care | Payer: Medicare HMO | Source: Ambulatory Visit | Attending: Hematology and Oncology | Admitting: Hematology and Oncology

## 2019-11-23 ENCOUNTER — Other Ambulatory Visit: Payer: Self-pay

## 2019-11-23 DIAGNOSIS — R922 Inconclusive mammogram: Secondary | ICD-10-CM | POA: Diagnosis not present

## 2019-11-23 DIAGNOSIS — Z853 Personal history of malignant neoplasm of breast: Secondary | ICD-10-CM

## 2019-11-23 LAB — NOVEL CORONAVIRUS, NAA: SARS-CoV-2, NAA: NOT DETECTED

## 2019-11-23 IMAGING — MG MM DIGITAL DIAGNOSTIC UNILAT*L* W/ TOMO W/ CAD
4 series · 4 of 12 positions shown · non-contrast
Comparison: Previous exam(s).

CLINICAL DATA: History of RIGHT breast cancer status post
mastectomy. Breast MRI dated [DATE] described an enhancing mass
within the upper-outer quadrant of the LEFT breast, measuring 10 mm,
for which MRI guided biopsy was recommended. However, no persistent
mass was seen in the LEFT breast on the date of patient's MRI-guided
biopsy attempt ([DATE]). Six-month follow-up breast MRI was
recommended at that time to ensure stability of that day's normal
appearance.

EXAM:
DIGITAL DIAGNOSTIC UNILATERAL LEFT MAMMOGRAM WITH CAD AND TOMO

[L CC synth-2D]
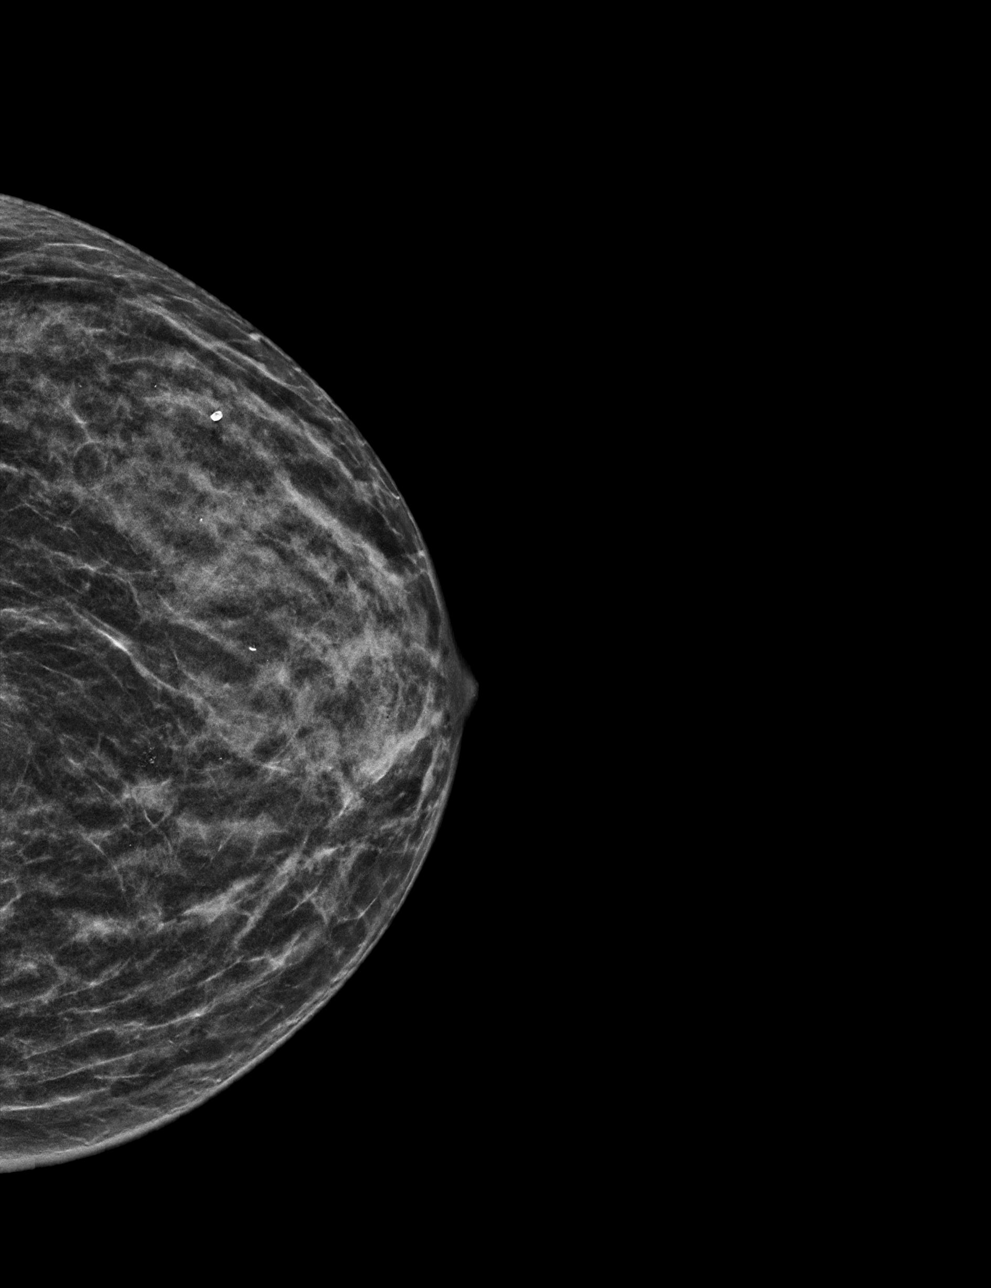

[L MLO synth-2D]
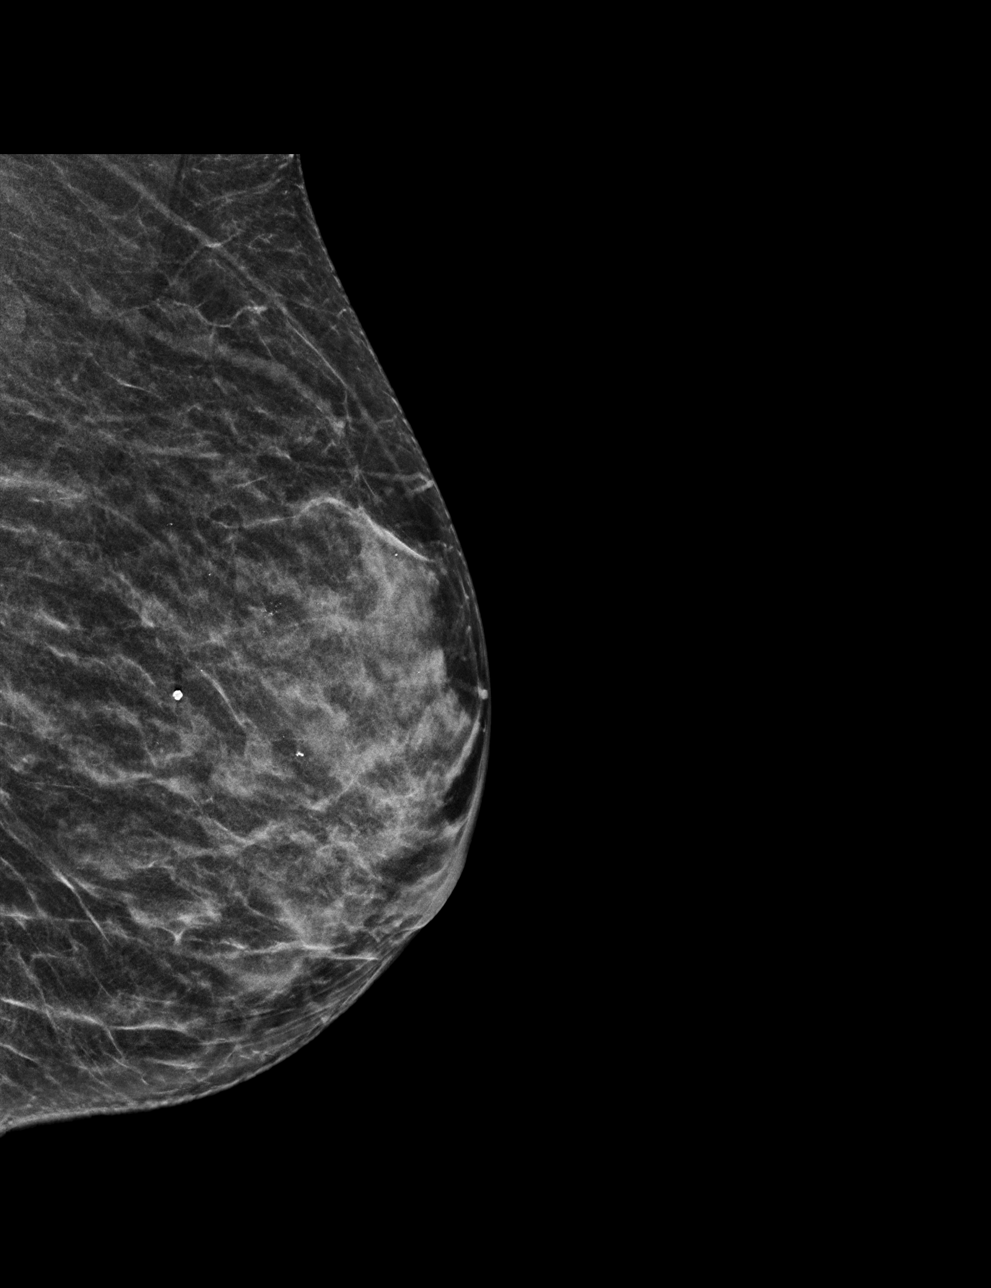

[L MLO tomo · tomo slice 23/44.0]
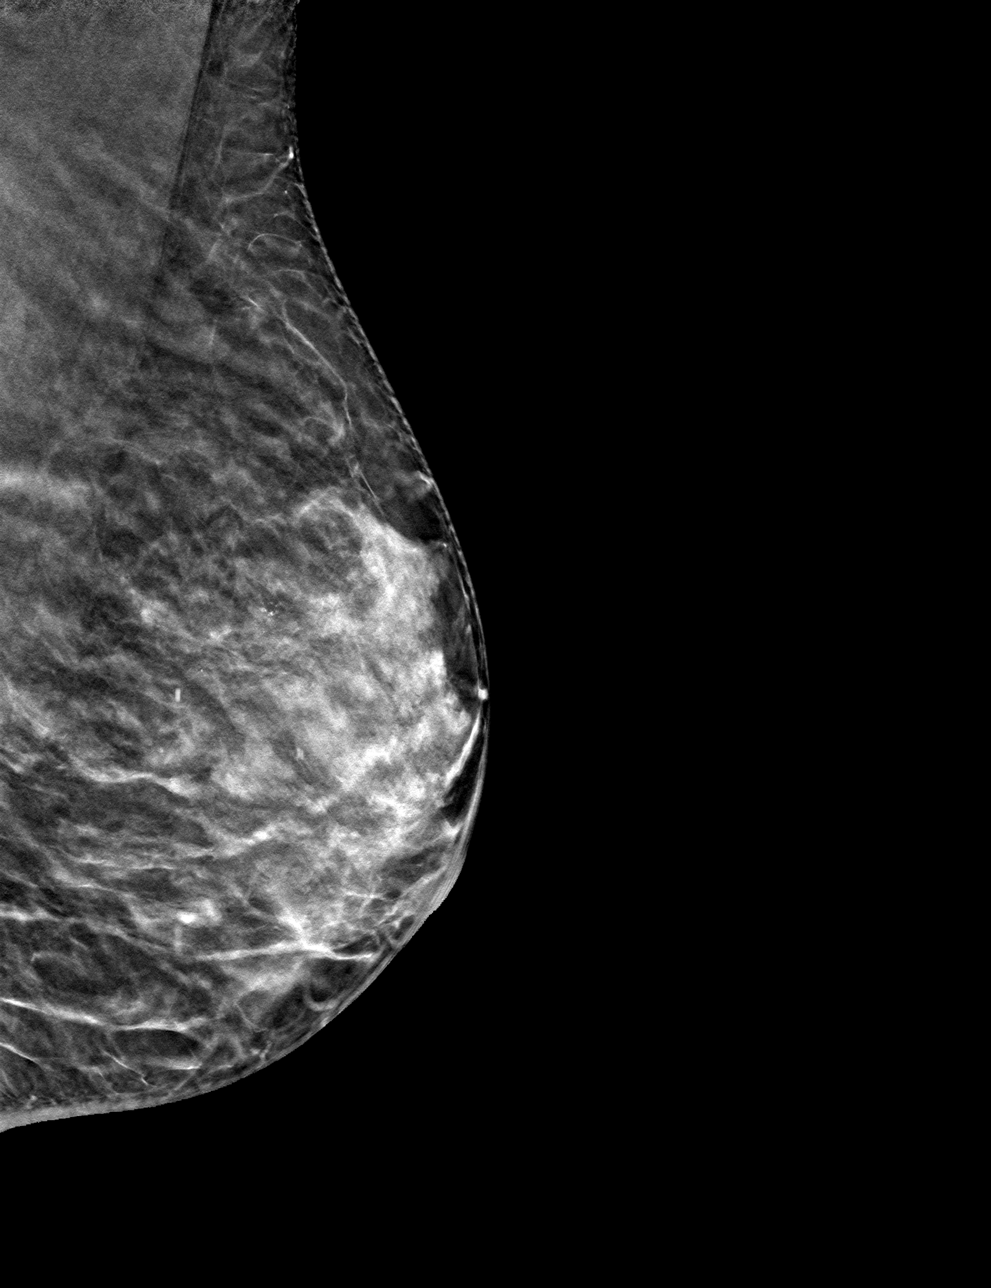

[L CC tomo · tomo slice 21/40.0]
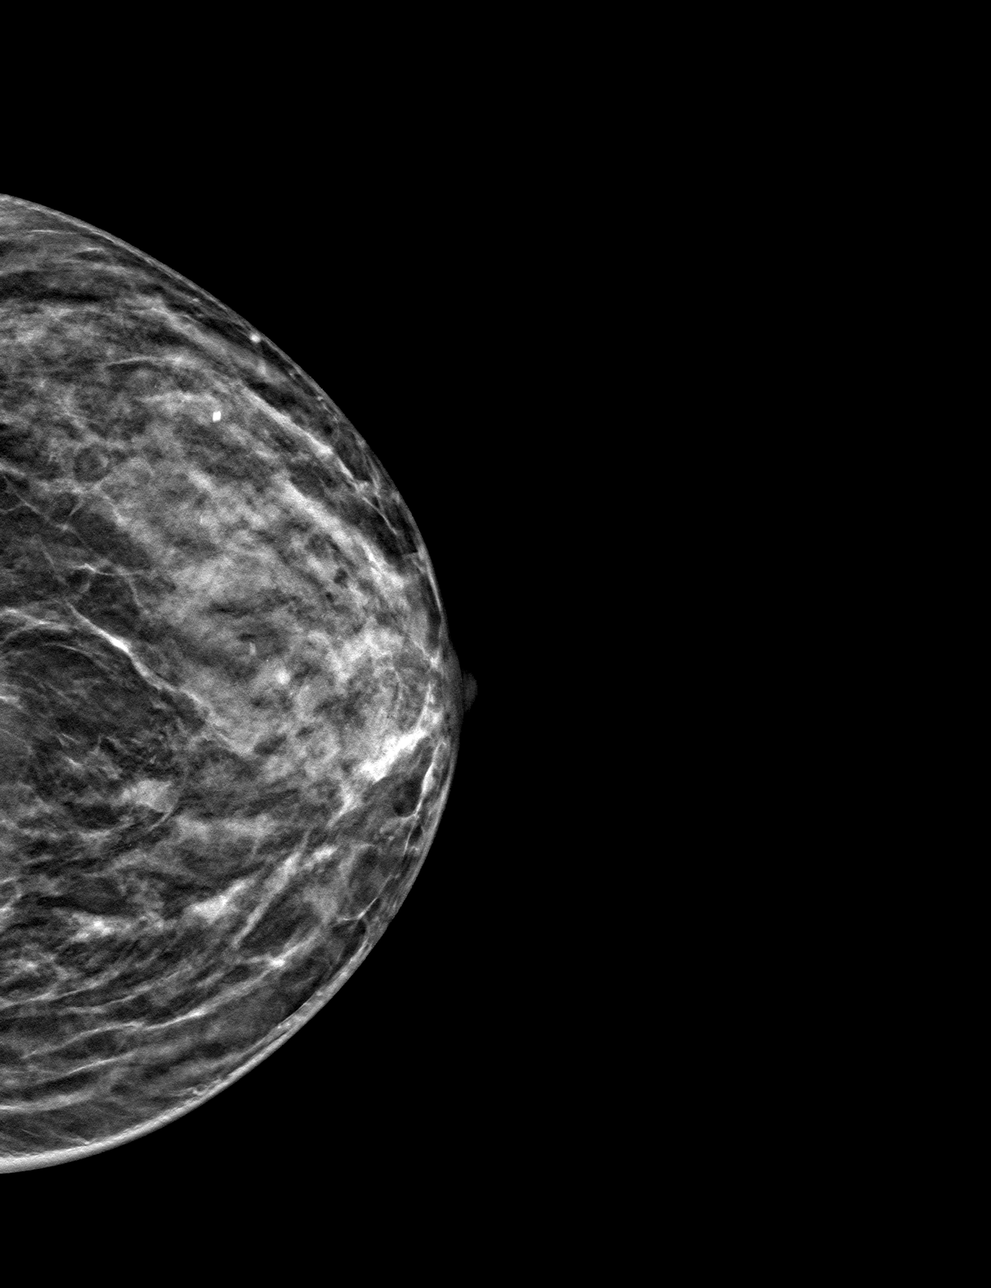

[4 of 12 positions shown; findings below may reference images not displayed]

ACR Breast Density Category c: The breast tissue is heterogeneously
dense, which may obscure small masses.
FINDINGS: There are no new dominant masses, suspicious calcifications or
secondary signs of malignancy identified within the LEFT breast.
Overall fibroglandular pattern is stable.

Mammographic images were processed with CAD.
IMPRESSION: No mammographic evidence of malignancy within the LEFT breast

RECOMMENDATION:
1. Patient is due for the follow-up LEFT breast MRI recommended on
MRI report dated [DATE].
[DATE]. Annual screening mammograms.

I have discussed the findings and recommendations with the patient.
If applicable, a reminder letter will be sent to the patient
regarding the next appointment.

BI-RADS CATEGORY  1: Negative.

## 2019-11-27 ENCOUNTER — Other Ambulatory Visit: Payer: Self-pay

## 2019-11-27 ENCOUNTER — Ambulatory Visit: Payer: Medicare HMO | Attending: Hematology and Oncology | Admitting: Physical Therapy

## 2019-11-27 DIAGNOSIS — R293 Abnormal posture: Secondary | ICD-10-CM | POA: Diagnosis not present

## 2019-11-27 DIAGNOSIS — M6281 Muscle weakness (generalized): Secondary | ICD-10-CM | POA: Diagnosis not present

## 2019-11-27 DIAGNOSIS — M25611 Stiffness of right shoulder, not elsewhere classified: Secondary | ICD-10-CM | POA: Insufficient documentation

## 2019-11-27 DIAGNOSIS — M25612 Stiffness of left shoulder, not elsewhere classified: Secondary | ICD-10-CM | POA: Insufficient documentation

## 2019-11-27 DIAGNOSIS — M25512 Pain in left shoulder: Secondary | ICD-10-CM | POA: Diagnosis not present

## 2019-11-27 NOTE — Addendum Note (Signed)
Addended by: Kipp Laurence on: 11/27/2019 06:30 PM   Modules accepted: Orders

## 2019-11-27 NOTE — Patient Instructions (Signed)

## 2019-11-27 NOTE — Therapy (Signed)
[facility] [place], [place], [address] Phone: [phone]   Fax:  [phone]  Physical Therapy Evaluation  Patient Details  Name: Debbie Watts MRN: [id number] Date of Birth: 03/08/1961 Referring Provider (PT): Lindi Adie   Encounter Date: 11/27/2019  PT End of Session - 11/27/19 1452    Visit Number  1    Number of Visits  9    Date for PT Re-Evaluation  12/26/19    PT Start Time  1400    PT Stop Time  1445    PT Time Calculation (min)  45 min    Activity Tolerance  Patient limited by pain    Behavior During Therapy  [facility] for tasks assessed/performed       Past Medical History:  Diagnosis Date  . Arthritis   . Asthma   . Cancer ([place])    chemo for breast ca  x13mo  . Depression   . Deviated nasal septum   . Hearing loss 06/11/2014   Bilateral  . Herniated cervical disc   . Hypertension   . Migraine   . Miscarriage    2  . [place]     Past Surgical History:  Procedure Laterality Date  . BREAST BIOPSY Right 11/21/2018   Malignant  . HEMATOMA EVACUATION Right 05/01/2019   Procedure: EVACUATION HEMATOMA;  Surgeon: WRolm Bookbinder MD;  Location: [facility]  Service: General;  Laterality: Right;  . MASTECTOMY Right 04/30/2019  . MASTECTOMY W/ SENTINEL NODE BIOPSY Right 04/30/2019   Procedure: RIGHT MASTECTOMY WITH  RIGHT AXILLARY SENTINEL LYMPH NODE BIOPSY INJECT BLUE DYE;  Surgeon: WRolm Bookbinder MD;  Location: [facility]  Service: General;  Laterality: Right;  . NASAL SEPTUM SURGERY    . SIMPLE MASTECTOMY WITH AXILLARY SENTINEL NODE BIOPSY Right 04/30/2019  . spinal injections      There were no vitals filed for this visit.   Subjective Assessment - 11/27/19 1409    Subjective  Pt states she has been having pain in her left shoudler about a month and half. ( side opposite breast cancer treatment) She doesn't know if it is from her sleeping position or the arthritis     Pertinent History  Rt mastectomy 04/30/19 with SLNB due to grade 2 IDC, high grade DCIS 0/2 lymph nodes. ER/PR positive, HER2 equivocal and then Rt breast hematoma evacuation on 05/01/19 by Dr. WDonne Hazel  Neo adjuvant chemotherapy TCH Perjeta started 12/12/18 to current. Future treatment to include Kadcyla and anastrozole x 7 years. Other history includes MS ( currently under control so will not address this episode) , HTN, OA, asthma, bilateral hearing loss    Patient Stated Goals  to get rid of the shoulder pain    Currently in Pain?  Yes    Pain Score  3     Pain Location  Shoulder    Pain Orientation  Left    Pain Descriptors / Indicators  Dull;Aching    Pain Type  Acute pain    Pain Onset  More than a month ago    Pain Frequency  Intermittent    Aggravating Factors   reaching up to a top shelf or reaching around to wash back    Pain Relieving Factors  not using it. period    Effect of Pain on Daily Activities  diffuclty with household chores and self care         [facility] Assessment - 11/27/19 0001  Assessment   Medical Diagnosis  Rt breast cancer    Referring Provider (PT)  Gudena    Onset Date/Surgical Date  04/30/19    Hand Dominance  Right      Precautions   Precaution Comments  lymphedema, HOH, MS      Restrictions   Weight Bearing Restrictions  No      Balance Screen   Has the patient fallen in the past 6 months  No    Has the patient had a decrease in activity level because of a fear of falling?   No    Is the patient reluctant to leave their home because of a fear of falling?   No      Home Environment   Living Environment  Private residence    Living Arrangements  Spouse/significant other    Available Help at Discharge  --   boyfriend is a double amputee    Additional Comments  pt has to help boyfriend with his self care that sometimes irritates her shoulder       Prior Function   Level of Independence  Independent    Leisure  takes care of boyfriend and his  grandson, no formal exercise       Cognition   Overall Cognitive Status  Within Functional Limits for tasks assessed      Observation/Other Assessments   Observations  well healed incision at chest     Skin Integrity  no edema present     Other Surveys   Quick Dash    Quick DASH   47.73      Sensation   Additional Comments  --      Coordination   Gross Motor Movements are Fluid and Coordinated  No   limited by pain in left arm with raising it      Posture/Postural Control   Posture/Postural Control  Postural limitations    Postural Limitations  Rounded Shoulders;Forward head      AROM   Overall AROM   Deficits    Overall AROM Comments  painful active movement of left shoulder with decreased scapular symmetry, gets worse  with repitition and cannot do more than 4 reps     AROM Assessment Site  Shoulder    Right Shoulder Extension  --    Right Shoulder Flexion  154 Degrees    Right Shoulder ABduction  146 Degrees    Right Shoulder Internal Rotation  75 Degrees    Right Shoulder External Rotation  90 Degrees    Right Shoulder Horizontal ABduction  --    Left Shoulder Extension  --    Left Shoulder Flexion  131 Degrees    Left Shoulder ABduction  120 Degrees    Left Shoulder Internal Rotation  65 Degrees    Left Shoulder External Rotation  47 Degrees    Left Shoulder Horizontal ABduction  --      PROM   Right Shoulder Flexion  --      Strength   Overall Strength Comments  left shoulder strength limited by pain to 3-/5 actively but 4/5 strength isomtrically if painless ,right shoulder WNL     Strength Assessment Site  Shoulder      Palpation   Palpation comment  tender to palpation at left anterior shoulder at [facility] joint , also painful at posterior shoulder and axilla, left pec major very tight and tender       Special Tests    Special Tests  Rotator Cuff  Impingement    Rotator Cuff Impingment tests  Neer impingement test;Hawkins- Kennedy test;Painful Arc of Motion       Neer Impingement test    Findings  Positive    Side  Left      Hawkins-Kennedy test   Findings  Positive    Side  Left      Painful Arc of Motion   Findings  Negative    Side  Left    Comments  very painfull with passive shoulder elevation that does not decrease. so no painful arc just pain with most pain and limitation in external rotation, then abdution, then flexion              Quick Dash - 11/27/19 0001    Open a tight or new jar  Mild difficulty    Do heavy household chores (wash walls, wash floors)  Severe difficulty    Carry a shopping bag or briefcase  Mild difficulty    Wash your back  Severe difficulty    Use a knife to cut food  No difficulty    Recreational activities in which you take some force or impact through your arm, shoulder, or hand (golf, hammering, tennis)  Mild difficulty    During the past week, to what extent has your arm, shoulder or hand problem interfered with your normal social activities with family, friends, neighbors, or groups?  Modererately    During the past week, to what extent has your arm, shoulder or hand problem limited your work or other regular daily activities  Quite a bit    Arm, shoulder, or hand pain.  Severe    Tingling (pins and needles) in your arm, shoulder, or hand  Mild    Difficulty Sleeping  Severe difficulty    DASH Score  47.73 %        Objective measurements completed on examination: See above findings.      [facility] Adult PT Treatment/Exercise - 11/27/19 0001      Shoulder Exercises: Supine   Other Supine Exercises  dowel rod flexion, abduction, external rotation and butterfly stretch              PT Education - 11/27/19 1452    Education Details  supine dowel rod exercises for both sholders    Person(s) Educated  Patient    Methods  Explanation;Demonstration;Handout    Comprehension  Verbalized understanding;Returned demonstration          PT Long Term Goals - 11/27/19 1802      PT LONG TERM  GOAL #1   Title  Pt will report she can reach overhead to get something off a shelf with 50% less pain    Time  4    Period  Weeks    Status  New      PT LONG TERM GOAL #2   Title  Pt will have 140 degrees of left shoulder abduction so that she can perform her activities of daily living easier    Time  4    Period  Weeks    Status  New      PT LONG TERM GOAL #3   Title  Pt will decrease her Quick DASH score to < 20 indicating a functional improvment of left shoulder    Baseline  47.73 on 11/27/2019    Time  4    Period  Weeks    Status  New  Plan - 11/27/19 1453    Clinical Impression Statement  Pt presents to PT with pain and diffculty raising arm overhead and behind back on her left( non breast cancer side).  Her right shoulder ( breast cancer side) is doing well  She presents with a capsular pattern of pain and does have impingment signs as well.  Will treat with manual techniques, exercise, modalities including iontophoresis for 4 weeks and if she does not improve, may need referral to orthopedist. Pt instructed in beginning ROM exericises today    Personal Factors and Comorbidities  Comorbidity 2    Comorbidities  MS, previous chemo    Examination-Activity Limitations  Reach Overhead;Lift;Hygiene/Grooming;Dressing;Bathing;Toileting    Stability/Clinical Decision Making  Stable/Uncomplicated    Clinical Decision Making  Low    Rehab Potential  Good    PT Frequency  2x / week    PT Duration  4 weeks    PT Treatment/Interventions  ADLs/Self Care Home Management;Iontophoresis 41m/ml Dexamethasone;Electrical Stimulation;Moist Heat;Therapeutic activities;Therapeutic exercise;Neuromuscular re-education;Patient/family education;Manual techniques;Manual lymph drainage;Passive range of motion;Taping;Joint Manipulations    PT Next Visit Plan  Begin manual techniques for increasing ROM including joing mobilizationa and soft tissue work as needed, progress exericse as  indicated, check to see if ionto on cert signed, modalities as needed    Consulted and Agree with Plan of Care  Patient       Patient will benefit from skilled therapeutic intervention in order to improve the following deficits and impairments:  Decreased range of motion, Decreased strength, Increased fascial restricitons, Impaired UE functional use, Postural dysfunction, Impaired perceived functional ability, Increased muscle spasms, Pain  Visit Diagnosis: Acute pain of left shoulder - Plan: PT plan of care cert/re-cert  Stiffness of left shoulder joint - Plan: PT plan of care cert/re-cert  Muscle weakness (generalized) - Plan: PT plan of care cert/re-cert  Abnormal posture - Plan: PT plan of care cert/re-cert     Problem List Patient Active Problem List   Diagnosis Date Noted  . Breast cancer, right ([place] 04/30/2019  . Port-A-Cath in place 02/13/2019  . Syncope 01/19/2019  . Nausea vomiting and diarrhea 01/19/2019  . Leukocytosis 01/19/2019  . Malignant neoplasm of upper-outer quadrant of right breast in female, estrogen receptor positive ([place] 11/27/2018  . Neurogenic claudication due to lumbar spinal stenosis 09/19/2018  . Urinary tract infection without hematuria 08/10/2018  . Chronic left-sided thoracic back pain 07/20/2018  . Tendon nodule 04/21/2018  . Solar lentigo 04/21/2018  . Preventative health care 04/20/2018  . Acute cystitis without hematuria 03/16/2018  . Gait disturbance 01/12/2018  . Polyuria 12/08/2017  . Easy bruising 08/31/2017  . Chronic obstructive pulmonary disease ([place] 11/30/2016  . Essential hypertension 01/26/2016  . Migraine headache 06/23/2014  . Multiple sclerosis ([place] 06/23/2014  . Paresthesias 06/23/2014  . Restless leg syndrome 06/23/2014  . Hearing loss, bilateral 06/11/2014  . Major depression, recurrent, chronic ([place] 06/11/2014  . Cervical herniated disc 06/11/2014  . Cigarette nicotine dependence without complication 072/53/6644  . Lumbar herniated disc 06/11/2014  . Perimenopausal symptoms 06/11/2014   TDonato Heinz BOwens SharkPT  BNorwood Levo2/11/2019, [address] [address]Phone: [phone]  Fax:  [phone] Name: SGayna BraddyMRN: [id number] of Birth: 606-17-1962

## 2019-11-29 ENCOUNTER — Ambulatory Visit: Payer: Medicare HMO | Admitting: Adult Health

## 2019-11-29 ENCOUNTER — Other Ambulatory Visit: Payer: Medicare HMO

## 2019-11-29 ENCOUNTER — Ambulatory Visit: Payer: Medicare HMO

## 2019-12-04 ENCOUNTER — Ambulatory Visit: Payer: Medicare HMO | Admitting: Physical Therapy

## 2019-12-04 ENCOUNTER — Encounter: Payer: Self-pay | Admitting: Physical Therapy

## 2019-12-04 ENCOUNTER — Other Ambulatory Visit: Payer: Self-pay

## 2019-12-04 DIAGNOSIS — R293 Abnormal posture: Secondary | ICD-10-CM

## 2019-12-04 DIAGNOSIS — M25612 Stiffness of left shoulder, not elsewhere classified: Secondary | ICD-10-CM | POA: Diagnosis not present

## 2019-12-04 DIAGNOSIS — M6281 Muscle weakness (generalized): Secondary | ICD-10-CM

## 2019-12-04 DIAGNOSIS — M25611 Stiffness of right shoulder, not elsewhere classified: Secondary | ICD-10-CM | POA: Diagnosis not present

## 2019-12-04 DIAGNOSIS — M25512 Pain in left shoulder: Secondary | ICD-10-CM | POA: Diagnosis not present

## 2019-12-04 NOTE — Therapy (Signed)
[place], [place], [address] Phone: [phone]   Fax:  [phone]  Physical Therapy Treatment  Patient Details  Name: Debbie Watts MRN: [id number] Date of Birth: 1961/07/19 Referring Provider (PT): Lindi Adie   Encounter Date: 12/04/2019  PT End of Session - 12/04/19 1454    Visit Number  2    Number of Visits  9    Date for PT Re-Evaluation  12/26/19    PT Start Time  1402    PT Stop Time  1450    PT Time Calculation (min)  48 min    Activity Tolerance  Patient tolerated treatment well    Behavior During Therapy  [facility] for tasks assessed/performed       Past Medical History:  Diagnosis Date  . Arthritis   . Asthma   . Cancer ([place])    chemo for breast ca  x28mo  . Depression   . Deviated nasal septum   . Hearing loss 06/11/2014   Bilateral  . Herniated cervical disc   . Hypertension   . Migraine   . Miscarriage    2  . [place] (multiple sclerosis) ([place]     Past Surgical History:  Procedure Laterality Date  . BREAST BIOPSY Right 11/21/2018   Malignant  . HEMATOMA EVACUATION Right 05/01/2019   Procedure: EVACUATION HEMATOMA;  Surgeon: WRolm Bookbinder MD;  Location: [facility]  Service: General;  Laterality: Right;  . MASTECTOMY Right 04/30/2019  . MASTECTOMY W/ SENTINEL NODE BIOPSY Right 04/30/2019   Procedure: RIGHT MASTECTOMY WITH  RIGHT AXILLARY SENTINEL LYMPH NODE BIOPSY INJECT BLUE DYE;  Surgeon: WRolm Bookbinder MD;  [place]  Service: General;  Laterality: Right;  . NASAL SEPTUM SURGERY    . SIMPLE MASTECTOMY WITH AXILLARY SENTINEL NODE BIOPSY Right 04/30/2019  . spinal injections      There were no vitals filed for this visit.  Subjective Assessment - 12/04/19 1400    Subjective  I have not been getting much sleep since my arm has been hurting so bad.    Pertinent History  Rt mastectomy 04/30/19 with SLNB due to grade 2 IDC, high grade DCIS 0/2 lymph nodes. ER/PR  positive, HER2 equivocal and then Rt breast hematoma evacuation on 05/01/19 by Dr. WDonne Hazel  Neo adjuvant chemotherapy TCH Perjeta started 12/12/18 to current. Future treatment to include Kadcyla and anastrozole x 7 years. Other history includes MS ( currently under control so will not address this episode) , HTN, OA, asthma, bilateral hearing loss    Patient Stated Goals  to get rid of the shoulder pain    Currently in Pain?  Yes    Pain Score  1     Pain Location  Shoulder    Pain Orientation  Left    Pain Descriptors / Indicators  Radiating    Pain Type  Acute pain    Pain Radiating Towards  down to fingers    Pain Onset  More than a month ago    Pain Frequency  Intermittent    Aggravating Factors   reaching up in to a cabinet                       [facility]ercise - 12/04/19 0001      Exercises   Exercises  Shoulder      Shoulder Exercises: Sidelying   External Rotation  AROM;Left;10 reps   pt with limited ROM and some  discomfort in upper arm   ABduction  AROM;Left;5 reps   pt with discomfort in top of shoulder/upper arm     Shoulder Exercises: Isometric Strengthening   Flexion  5X5"   pt returned therapist demo   Extension  5X5"   pt returned therapist demo   External Rotation  5X5"   pt returned therapist demo   ABduction  5X5"   pt returned therapist demo     Modalities   Modalities  Iontophoresis      Iontophoresis   Type of Iontophoresis  Dexamethasone    Location  insertion of rotator cuff tendons - left upper arm    Dose  97m/1mL    Time  4-6 hrs      Manual Therapy   Manual Therapy  Scapular mobilization;Soft tissue mobilization;Joint mobilization    Joint Mobilization  attempted to left shoulder with gentle posterior glides with some increase in pain so stopped    Soft tissue mobilization  in supine to left upper arm including insertion of rotator cuff muscles, deltoid, etc- numerous tender areas and knots palpable     Scapular Mobilization  to right sidelying to left scapula in direction of retraction with moving arm in to flexion and abduction                  PT Long Term Goals - 11/27/19 1802      PT LONG TERM GOAL #1   Title  Pt will report she can reach overhead to get something off a shelf with 50% less pain    Time  4    Period  Weeks    Status  New      PT LONG TERM GOAL #2   Title  Pt will have 140 degrees of left shoulder abduction so that she can perform her activities of daily living easier    Time  4    Period  Weeks    Status  New      PT LONG TERM GOAL #3   Title  Pt will decrease her Quick DASH score to < 20 indicating a functional improvment of left shoulder    Baseline  47.73 on 11/27/2019    Time  4    Period  Weeks    Status  New            Plan - 12/04/19 1456    Clinical Impression Statement  Began isometric exercises today since pt has pain with movement of left shoulder. She did not have any increased  pain with isometrics so issued these as part of an HEP. Performed soft tissue mobilization to areas of pain in left upper arm and along rotator cuff tendons with numerous areas of tightness and knots palpable. At end of session applied ionto patch in area of pain and educated pt to leave it on for 4-6 hrs and remove if she has any discomfort or skin irritation other than mild tingling which is normal. Educated pt not to push through pain when reaching/moving arm as to not continue to irritate tendons.    PT Frequency  2x / week    PT Duration  4 weeks    PT Treatment/Interventions  ADLs/Self Care Home Management;Iontophoresis 477mml Dexamethasone;Electrical Stimulation;Moist Heat;Therapeutic activities;Therapeutic exercise;Neuromuscular re-education;Patient/family education;Manual techniques;Manual lymph drainage;Passive range of motion;Taping;Joint Manipulations    PT Next Visit Plan  continue manual techniques for increasing ROM including joing mobilizationa  and soft tissue work as needed, progress exericse as indicated, check to  see if ionto on cert signed, modalities as needed    Consulted and Agree with Plan of Care  Patient       Patient will benefit from skilled therapeutic intervention in order to improve the following deficits and impairments:  Decreased range of motion, Decreased strength, Increased fascial restricitons, Impaired UE functional use, Postural dysfunction, Impaired perceived functional ability, Increased muscle spasms, Pain  Visit Diagnosis: Acute pain of left shoulder  Stiffness of left shoulder joint  Muscle weakness (generalized)  Abnormal posture     Problem List Patient Active Problem List   Diagnosis Date Noted  . Breast cancer, right ([place]) 04/30/2019  . Port-A-Cath in place 02/13/2019  . Syncope 01/19/2019  . Nausea vomiting and diarrhea 01/19/2019  . Leukocytosis 01/19/2019  . Malignant neoplasm of upper-outer quadrant of right breast in female, estrogen receptor positive ([place]) 11/27/2018  . Neurogenic claudication due to lumbar spinal stenosis 09/19/2018  . Urinary tract infection without hematuria 08/10/2018  . Chronic left-sided thoracic back pain 07/20/2018  . Tendon nodule 04/21/2018  . Solar lentigo 04/21/2018  . Preventative health care 04/20/2018  . Acute cystitis without hematuria 03/16/2018  . Gait disturbance 01/12/2018  . Polyuria 12/08/2017  . Easy bruising 08/31/2017  . Chronic obstructive pulmonary disease ([place]) 11/30/2016  . Essential hypertension 01/26/2016  . Migraine headache 06/23/2014  . Multiple sclerosis ([place]) 06/23/2014  . Paresthesias 06/23/2014  . Restless leg syndrome 06/23/2014  . Hearing loss, bilateral 06/11/2014  . Major depression, recurrent, chronic ([place]) 06/11/2014  . Cervical herniated disc 06/11/2014  . Cigarette nicotine dependence without complication 86/75/4492  . Lumbar herniated disc 06/11/2014  . Perimenopausal symptoms 06/11/2014    Allyson Sabal [facility] 12/04/2019, 3:00 PM  [place], [place], [address] Phone: [phone]   Fax:  [phone]  Name: Georgie Eduardo MRN: [id number] Date of Birth: Dec 21, 1960  Manus Gunning, PT 12/04/19 3:02 PM

## 2019-12-04 NOTE — Patient Instructions (Signed)
Access Code: [email or web address]  URL: [email or web address]  Date: 12/04/2019  Prepared by: Manus Gunning   Exercises Isometric Shoulder Abduction at Wall - 10 reps - 1 sets - 5 hold - 1-2x daily - 7x weekly Isometric Shoulder Extension at Wall - 10 reps - 1 sets - 5 sec hold - 1-2x daily - 7x weekly Isometric Shoulder Flexion at Wall - 10 reps - 1 sets - 5 sec hold - 1-2x daily - 7x weekly Isometric Shoulder External Rotation at Wall - 10 reps - 1 sets - 5 sec hold - 1-2x daily - 7x weekly

## 2019-12-06 ENCOUNTER — Ambulatory Visit: Payer: Medicare HMO | Admitting: Physical Therapy

## 2019-12-06 ENCOUNTER — Encounter: Payer: Self-pay | Admitting: Physical Therapy

## 2019-12-06 ENCOUNTER — Other Ambulatory Visit: Payer: Self-pay

## 2019-12-06 DIAGNOSIS — M6281 Muscle weakness (generalized): Secondary | ICD-10-CM | POA: Diagnosis not present

## 2019-12-06 DIAGNOSIS — R293 Abnormal posture: Secondary | ICD-10-CM | POA: Diagnosis not present

## 2019-12-06 DIAGNOSIS — M25512 Pain in left shoulder: Secondary | ICD-10-CM | POA: Diagnosis not present

## 2019-12-06 DIAGNOSIS — M25612 Stiffness of left shoulder, not elsewhere classified: Secondary | ICD-10-CM | POA: Diagnosis not present

## 2019-12-06 DIAGNOSIS — M25611 Stiffness of right shoulder, not elsewhere classified: Secondary | ICD-10-CM | POA: Diagnosis not present

## 2019-12-06 NOTE — Therapy (Signed)
[place], [place], [address] Phone: [phone]   Fax:  [phone]  Physical Therapy Treatment  Patient Details  Name: Debbie Watts MRN: [id number] Date of Birth: [birth year] Referring Provider (PT): Lindi Adie   Encounter Date: 12/06/2019  PT End of Session - 12/06/19 1455    Visit Number  3    Number of Visits  9    Date for PT Re-Evaluation  12/26/19    PT Start Time  1400    PT Stop Time  1445    PT Time Calculation (min)  45 min    Activity Tolerance  Patient tolerated treatment well    Behavior During Therapy  [facility] for tasks assessed/performed       Past Medical History:  Diagnosis Date  . Arthritis   . Asthma   . Cancer ([place])    chemo for breast ca  x74mo  . Depression   . Deviated nasal septum   . Hearing loss 06/11/2014   Bilateral  . Herniated cervical disc   . Hypertension   . Migraine   . Miscarriage    2  . [place]     Past Surgical History:  Procedure Laterality Date  . BREAST BIOPSY Right 11/21/2018   Malignant  . HEMATOMA EVACUATION Right 05/01/2019   Procedure: EVACUATION HEMATOMA;  Surgeon: WRolm Bookbinder MD;  [place] [facility]  Service: General;  Laterality: Right;  . MASTECTOMY Right 04/30/2019  . MASTECTOMY W/ SENTINEL NODE BIOPSY Right 04/30/2019   Procedure: RIGHT MASTECTOMY WITH  RIGHT AXILLARY SENTINEL LYMPH NODE BIOPSY INJECT BLUE DYE;  Surgeon: WRolm Bookbinder MD;  [place] [facility]  Service: General;  Laterality: Right;  . NASAL SEPTUM SURGERY    . SIMPLE MASTECTOMY WITH AXILLARY SENTINEL NODE BIOPSY Right 04/30/2019  . spinal injections      There were no vitals filed for this visit.  Subjective Assessment - 12/06/19 1447    Subjective  Pt states she is still having trouble with her shoulder. She is not able to wash her back. She says she has alot of "knots"  She said the ionto patch did not help her at all    Pertinent  History  Rt mastectomy 04/30/19 with SLNB due to grade 2 IDC, high grade DCIS 0/2 lymph nodes. ER/PR positive, HER2 equivocal and then Rt breast hematoma evacuation on 05/01/19 by Dr. WDonne Hazel  Neo adjuvant chemotherapy [facility] Perjeta started 12/12/18 to current. Future treatment to include Kadcyla and anastrozole x 7 years. Other history includes MS ( currently under control so will not address this episode) , HTN, OA, asthma, bilateral hearing loss    Patient Stated Goals  to get rid of the shoulder pain    Currently in Pain?  No/denies   at rest 4/10 with movement                      [facility] 12[facility]19 0001      Exercises   Exercises  Neck;Other Exercises    Other Exercises   pt with no effect on left shoulder ROM with repeated trunk twists to rifht or 2 x 10 rpes of right shoulder elevation.  She did have less pain with one legged sit to stand x 10 reps on left leg and with right  arm elevation into end range.  Pt also had more painfree right arm elevation after 10 reps of bilateral sit to stand  Neck Exercises: Supine   Neck Retraction  5 reps      Manual Therapy   Manual Therapy  Soft tissue mobilization;Scapular mobilization    Manual therapy comments  pt with less "knots" at end of session and pt felt that she got some relief     Soft tissue mobilization  in right sidelying and with thick massage cream to left deltoid, scapular and cervical muscles.  Pt with increased tightess and trigger points in upper trap  Very tender cervical muscles with pain shooting down top of arm to elbow     Scapular Mobilization  in right sidelying, inferiot glides for cervical stretch              PT Education - 12/06/19 1455    Education Details  do sit to stand at home and stretching of left arm up the wall    Person(s) Educated  Patient    Methods  Explanation;Demonstration    Comprehension  Verbalized understanding;Returned demonstration           PT Long Term Goals - 11/27/19 1802      PT LONG TERM GOAL #1   Title  Pt will report she can reach overhead to get something off a shelf with 50% less pain    Time  4    Period  Weeks    Status  New      PT LONG TERM GOAL #2   Title  Pt will have 140 degrees of left shoulder abduction so that she can perform her activities of daily living easier    Time  4    Period  Weeks    Status  New      PT LONG TERM GOAL #3   Title  Pt will decrease her Quick DASH score to < 20 indicating a functional improvment of left shoulder    Baseline  47.73 on 11/27/2019    Time  4    Period  Weeks    Status  New            Plan - 12/06/19 1456    Clinical Impression Statement  Pt has tight trigger points in left upper quadrant that were helped with soft tissue but she did have referred pain down left arm with pressure at cervical area.  She has tightness in external rotation and flexion with pain.  She got some relief with total motion release exercises for leg strength and push into end range of right arm    Personal Factors and Comorbidities  Comorbidity 2    Comorbidities  MS, previous chemo    Examination-Activity Limitations  Reach Overhead;Lift;Hygiene/Grooming;Dressing;Bathing;Toileting    Stability/Clinical Decision Making  Stable/Uncomplicated    Clinical Decision Making  Low    Rehab Potential  Good    PT Frequency  2x / week    PT Duration  4 weeks    PT Treatment/Interventions  ADLs/Self Care Home Management;Iontophoresis 88m/ml Dexamethasone;Electrical Stimulation;Moist Heat;Therapeutic activities;Therapeutic exercise;Neuromuscular re-education;Patient/family education;Manual techniques;Manual lymph drainage;Passive range of motion;Taping;Joint Manipulations    PT Next Visit Plan  moist pack to neck , then soft tissue work to neck and upper quadrant with manual techniques and PROM for increasing ROM including joint mobilization and soft tissue work as needed, progress  exericse as indicated, , modalities as needed    Consulted and Agree with Plan of Care  Patient       Patient will benefit from skilled therapeutic intervention in order to improve the following deficits  and impairments:  Decreased range of motion, Decreased strength, Increased fascial restricitons, Impaired UE functional use, Postural dysfunction, Impaired perceived functional ability, Increased muscle spasms, Pain  Visit Diagnosis: Acute pain of left shoulder  Stiffness of left shoulder joint  Muscle weakness (generalized)  Abnormal posture     Problem List Patient Active Problem List   Diagnosis Date Noted  . Breast cancer, right ([place]) 04/30/2019  . Port-A-Cath in place 02/13/2019  . Syncope 01/19/2019  . Nausea vomiting and diarrhea 01/19/2019  . Leukocytosis 01/19/2019  . Malignant neoplasm of upper-outer quadrant of right breast in female, estrogen receptor positive ([place]) 11/27/2018  . Neurogenic claudication due to lumbar spinal stenosis 09/19/2018  . Urinary tract infection without hematuria 08/10/2018  . Chronic left-sided thoracic back pain 07/20/2018  . Tendon nodule 04/21/2018  . Solar lentigo 04/21/2018  . Preventative health care 04/20/2018  . Acute cystitis without hematuria 03/16/2018  . Gait disturbance 01/12/2018  . Polyuria 12/08/2017  . Easy bruising 08/31/2017  . Chronic obstructive pulmonary disease ([place]) 11/30/2016  . Essential hypertension 01/26/2016  . Migraine headache 06/23/2014  . Multiple sclerosis ([place]) 06/23/2014  . Paresthesias 06/23/2014  . Restless leg syndrome 06/23/2014  . Hearing loss, bilateral 06/11/2014  . Major depression, recurrent, chronic ([place]) 06/11/2014  . Cervical herniated disc 06/11/2014  . Cigarette nicotine dependence without complication 02/04/6437  . Lumbar herniated disc 06/11/2014  . Perimenopausal symptoms 06/11/2014   Donato Heinz. Owens Shark PT  Norwood Levo 12/06/2019, 4:27 PM  [place], [place], [address] Phone: [phone]   Fax:  [phone]  Name: Debbie Watts MRN: [id number] Date of Birth: 1961/09/22

## 2019-12-10 ENCOUNTER — Telehealth: Payer: Self-pay | Admitting: *Deleted

## 2019-12-10 ENCOUNTER — Other Ambulatory Visit: Payer: Self-pay | Admitting: Internal Medicine

## 2019-12-10 DIAGNOSIS — F339 Major depressive disorder, recurrent, unspecified: Secondary | ICD-10-CM

## 2019-12-10 DIAGNOSIS — I10 Essential (primary) hypertension: Secondary | ICD-10-CM

## 2019-12-10 NOTE — Telephone Encounter (Signed)
Gave completed/signed DMV application for handicapped drivers registration plate form back to medical records to process for pt.

## 2019-12-11 ENCOUNTER — Ambulatory Visit: Payer: Medicare HMO | Admitting: Physical Therapy

## 2019-12-11 ENCOUNTER — Encounter: Payer: Self-pay | Admitting: Physical Therapy

## 2019-12-11 ENCOUNTER — Other Ambulatory Visit: Payer: Self-pay

## 2019-12-11 ENCOUNTER — Other Ambulatory Visit: Payer: Self-pay | Admitting: Internal Medicine

## 2019-12-11 DIAGNOSIS — M6281 Muscle weakness (generalized): Secondary | ICD-10-CM

## 2019-12-11 DIAGNOSIS — M25612 Stiffness of left shoulder, not elsewhere classified: Secondary | ICD-10-CM

## 2019-12-11 DIAGNOSIS — M25611 Stiffness of right shoulder, not elsewhere classified: Secondary | ICD-10-CM | POA: Diagnosis not present

## 2019-12-11 DIAGNOSIS — R293 Abnormal posture: Secondary | ICD-10-CM

## 2019-12-11 DIAGNOSIS — M25512 Pain in left shoulder: Secondary | ICD-10-CM

## 2019-12-11 NOTE — Patient Instructions (Signed)

## 2019-12-11 NOTE — Progress Notes (Signed)
Patient Care Team: Neva Seat, MD as PCP - General (Internal Medicine) Excell Seltzer, MD (Inactive) as Consulting Physician (General Surgery) Nicholas Lose, MD as Consulting Physician (Hematology and Oncology) Kyung Rudd, MD as Consulting Physician (Radiation Oncology)  DIAGNOSIS:    ICD-10-CM   1. Malignant neoplasm of upper-outer quadrant of right breast in female, estrogen receptor positive ([place])  C50.411    Z17.0     SUMMARY OF ONCOLOGIC HISTORY: Oncology History  Malignant neoplasm of upper-outer quadrant of right breast in female, estrogen receptor positive ([place])  11/21/2018 Initial Diagnosis   Screening detected right breast mass upper outer quadrant, in addition to areas of calcifications which were not biopsied.  By ultrasound she had 2 breast masses 12 o'clock position 2.9 cm: Grade 2 IDC with high-grade DCIS ER 100%, PR 70%, Ki-67 10%, HER-2 3+ by IHC and 11 o'clock position 1 cm, ER 90%, PR 50%, Ki-67 15%, HER-2 3+ by IHC, T2N0 stage Ib   11/29/2018 Cancer Staging   Staging form: Breast, AJCC 8th Edition - Clinical stage from 11/29/2018: Stage IB (cT2, cN0, cM0, G3, ER+, PR+, HER2+) - Signed by Nicholas Lose, MD on 11/29/2018   12/12/2018 -  Neo-Adjuvant Chemotherapy   Neoadjuvant chemotherapy with [facility] Perjeta   01/18/2019 - 01/20/2019 Hospital Admission   Syncope   04/30/2019 Surgery   Right mastectomy: Grade 2 IDC, 3.9 cm, high-grade DCIS, margins are negative, 0/2 lymph nodes negative, ER 95%, PR 80%, HER-2 2+ equivocal, T2 N0   04/30/2019 Cancer Staging   Staging form: Breast, AJCC 8th Edition - Pathologic stage from 04/30/2019: No Stage Recommended (ypT2, pN0, cM0, G2, ER+, PR+, HER2-) - Signed by Gardenia Phlegm, NP on 05/09/2019   05/24/2019 -  Chemotherapy   The patient had ado-trastuzumab emtansine (KADCYLA) 200 mg in sodium chloride 0.9 % 250 mL chemo infusion, 240 mg, Intravenous, Once, 9 of 10 cycles Dose modification: 3 mg/kg (original dose 3.6  mg/kg, Cycle 5, Reason: Dose not tolerated) Administration: 200 mg (05/24/2019), 200 mg (06/14/2019), 160 mg (08/16/2019), 160 mg (09/06/2019), 160 mg (09/27/2019), 160 mg (10/31/2019), 160 mg (07/05/2019), 160 mg (07/26/2019), 160 mg (11/21/2019)  for chemotherapy treatment.    05/24/2019 -  Anti-estrogen oral therapy   Anastrozole 1 mg daily     CHIEF COMPLIANT: Follow-up ofKadcyla maintenance  INTERVAL HISTORY: Debbie Watts is a 59 y.o. with above-mentioned history of right breast cancer who completed neoadjuvant chemotherapy with [facility] Perjetaand underwent a right mastectomy.Sheis currently on adjuvant treatment withKadcyla maintenanceand anti-estrogen therapy with anastrozole.Mammogram on 11/23/19 showed no evidence of malignancy in the left breast. She presents to the clinic todayfor treatment.     ALLERGIES:  is allergic to procaine.  MEDICATIONS:  Current Outpatient Medications  Medication Sig Dispense Refill  . albuterol (VENTOLIN HFA) 108 (90 Base) MCG/ACT inhaler Inhale 2 puffs into the lungs every 6 (six) hours as needed for wheezing or shortness of breath.    . anastrozole (ARIMIDEX) 1 MG tablet Take 1 tablet (1 mg total) by mouth daily. 90 tablet 3  . aspirin-acetaminophen-caffeine (EXCEDRIN MIGRAINE) 250-250-65 MG tablet Take 2 tablets by mouth every 6 (six) hours as needed for migraine.    . betamethasone valerate lotion (VALISONE) 0.1 % Apply 1 application topically daily as needed for irritation.     . carbidopa-levodopa (SINEMET IR) 10-100 MG tablet Take 1 tablet by mouth at bedtime. 90 tablet 3  . diclofenac (VOLTAREN) 75 MG EC tablet TAKE 1 TABLET BY MOUTH TWICE A DAY 60  tablet 5  . diphenhydrAMINE (BENADRYL) 25 MG tablet Take 25 mg by mouth daily.    . fluticasone (FLONASE) 50 MCG/ACT nasal spray Place 2 sprays into both nostrils at bedtime.    . gabapentin (NEURONTIN) 300 MG capsule Take 2 capsules (600 mg total) by mouth 2 (two) times daily. 360 capsule 3  .  lisinopril (ZESTRIL) 5 MG tablet TAKE 1 TABLET BY MOUTH EVERY DAY 90 tablet 1  . methocarbamol (ROBAXIN) 500 MG tablet Take 1 tablet (500 mg total) by mouth every 8 (eight) hours as needed for muscle spasms. 20 tablet 1  . Multiple Vitamin (MULTIVITAMIN WITH MINERALS) TABS tablet Take 2 tablets by mouth daily.    Marland Kitchen omeprazole (PRILOSEC OTC) 20 MG tablet Take 20 mg by mouth daily as needed (acid reflux).    Marland Kitchen oxyCODONE-acetaminophen (PERCOCET/ROXICET) 5-325 MG tablet Take 1 tablet by mouth every 8 (eight) hours as needed for severe pain. 60 tablet 0  . PARoxetine (PAXIL) 20 MG tablet TAKE 1 TABLET BY MOUTH EVERY DAY 90 tablet 1  . TECFIDERA 240 MG CPDR Take 1 capsule by mouth twice daily 180 capsule 3  . traMADol (ULTRAM) 50 MG tablet Take 1 tablet (50 mg total) by mouth 2 (two) times daily as needed. 60 tablet 3   No current facility-administered medications for this visit.    PHYSICAL EXAMINATION: ECOG PERFORMANCE STATUS: 1 - Symptomatic but completely ambulatory  Vitals:   12/12/19 1359  BP: 123/81  Pulse: 66  Resp: 18  Temp: 97.8 F (36.6 C)  SpO2: 100%   Filed Weights   12/12/19 1359  Weight: 116 lb 12.3 oz (53 kg)    LABORATORY DATA:  I have reviewed the data as listed CMP Latest Ref Rng & Units 12/12/2019 11/21/2019 10/31/2019  Glucose 70 - 99 mg/dL 93 92 84  BUN 6 - 20 mg/dL 13 18 24(H)  Creatinine 0.44 - 1.00 mg/dL 0.68 0.74 0.72  Sodium 135 - 145 mmol/L 141 143 141  Potassium 3.5 - 5.1 mmol/L 3.7 3.6 3.9  Chloride 98 - 111 mmol/L 103 105 105  CO2 22 - 32 mmol/L '30 28 27  ' Calcium 8.9 - 10.3 mg/dL 8.9 8.4(L) 8.2(L)  Total Protein 6.5 - 8.1 g/dL 7.3 6.5 7.0  Total Bilirubin 0.3 - 1.2 mg/dL 0.4 0.4 0.3  Alkaline Phos 38 - 126 U/L 85 89 104  AST 15 - 41 U/L 35 24 26  ALT 0 - 44 U/L 44 28 30    Lab Results  Component Value Date   WBC 4.0 12/12/2019   HGB 13.9 12/12/2019   HCT 41.3 12/12/2019   MCV 93.2 12/12/2019   PLT 157 12/12/2019   NEUTROABS 2.9 12/12/2019     ASSESSMENT & PLAN:  Malignant neoplasm of upper-outer quadrant of right breast in female, estrogen receptor positive ([place]) 11/21/2018:Screening detected right breast mass upper outer quadrant, in addition to areas of calcifications which were not biopsied. By ultrasound she had 2 breast masses 12 o'clock position 2.9 cm: Grade 2 IDC with high-grade DCIS ER 100%, PR 70%, Ki-67 10%, HER-2 3+ by IHC and 11 o'clock position 1 cm, ER 90%, PR 50%, Ki-67 15%, HER-2 3+ by IHC, T2N0 stage Ib  Neoadjuvant chemotherapy with [facility] Perjeta x6 cycles 12/12/2018-03/27/2019  04/30/2019:Right mastectomy: Grade 2 IDC, 3.9 cm, high-grade DCIS, margins are negative, 0/2 lymph nodes negative, ER 95%, PR 80%, HER-2 2+ equivocal, T2 N0  Treatment plan: 1.Adjuvant Kadcylastarted 05/24/2019 2.Adjuvant antiestrogen therapy with anastrozole 1 mg p.o.  daily x7 yearsstarted 05/24/2019 ----------------------------------------------------------------------------------------------------------------------------- Current treatment: Cycle10Kadcyla maintenance(last Kadcyla to be given 11/29/2019) Labs reviewed  Kadcyla toxicities: 1.Loss of appetite:  Improved 2.Fatigue:  Stable 3.Chemo-induced peripheral neuropathy:Probably related to MS but being monitored, reduced the dosage of Kadcyla  Low back pain with pain radiating down the leg:On Percocets 09/24/2019: MRI lumbar spine: Negative for metastatic disease. Mild degenerative changes withoutdisc protrusion or stenosis 09/24/2019: Bone scan: Nonspecific uptake right 11th rib  Anastrozole toxicities: Denies any hot flashes or myalgias. Today is the last dose of Kadcyla.  Breast cancer surveillance: Breast MRI done in June 2020 showed a 10 mm nodule which disappeared on attempts at MRI guided biopsy.  The recommendation was to recheck it with another MRI in 6 months.  She is past due for that.  Therefore we will order an MRI on 12/21/2019.  I will call her on  12/24/2019 to go over the results.  Patient wants to wait on the MRI results before scheduling for port removal.  Return to clinic in 6 months for follow-up    No orders of the defined types were placed in this encounter.  The patient has a good understanding of the overall plan. she agrees with it. she will call with any problems that may develop before the next visit here.  Total time spent: 30 mins including face to face time and time spent for planning, charting and coordination of care  Nicholas Lose, MD 12/12/2019  I, Cloyde Reams Dorshimer, am acting as scribe for Dr. Nicholas Lose.  I have reviewed the above documentation for accuracy and completeness, and I agree with the above.

## 2019-12-11 NOTE — Therapy (Signed)
[place], [place], [address] Phone: [phone]   Fax:  [phone]  Physical Therapy Treatment  Patient Details  Name: Debbie Watts MRN: [id number] Date of Birth: 1961-07-14 Referring Provider (PT): Lindi Adie   Encounter Date: 12/11/2019  PT End of Session - 12/11/19 1446    Visit Number  4    Number of Visits  9    Date for PT Re-Evaluation  12/26/19    PT Start Time  1400    PT Stop Time  1445    PT Time Calculation (min)  45 min    Activity Tolerance  Patient tolerated treatment well    Behavior During Therapy  [facility] for tasks assessed/performed       Past Medical History:  Diagnosis Date  . Arthritis   . Asthma   . Cancer ([place])    chemo for breast ca  x56mo  . Depression   . Deviated nasal septum   . Hearing loss 06/11/2014   Bilateral  . Herniated cervical disc   . Hypertension   . Migraine   . Miscarriage    2  . MS (multiple sclerosis) ([place]     Past Surgical History:  Procedure Laterality Date  . BREAST BIOPSY Right 11/21/2018   Malignant  . HEMATOMA EVACUATION Right 05/01/2019   Procedure: EVACUATION HEMATOMA;  Surgeon: WRolm Bookbinder MD;  [place] [facility]  Service: General;  Laterality: Right;  . MASTECTOMY Right 04/30/2019  . MASTECTOMY W/ SENTINEL NODE BIOPSY Right 04/30/2019   Procedure: RIGHT MASTECTOMY WITH  RIGHT AXILLARY SENTINEL LYMPH NODE BIOPSY INJECT BLUE DYE;  Surgeon: WRolm Bookbinder MD;  [place] [facility]  Service: General;  Laterality: Right;  . NASAL SEPTUM SURGERY    . SIMPLE MASTECTOMY WITH AXILLARY SENTINEL NODE BIOPSY Right 04/30/2019  . spinal injections      There were no vitals filed for this visit.  Subjective Assessment - 12/11/19 1403    Subjective  Pt thinks her shoulder might be feeling  a little better    Pertinent History  Rt mastectomy 04/30/19 with SLNB due to grade 2 IDC, high grade DCIS 0/2 lymph nodes. ER/PR positive, HER2  equivocal and then Rt breast hematoma evacuation on 05/01/19 by Dr. WDonne Hazel  Neo adjuvant chemotherapy TCH Perjeta started 12/12/18 to current. Future treatment to include Kadcyla and anastrozole x 7 years. Other history includes MS ( currently under control so will not address this episode) , HTN, OA, asthma, bilateral hearing loss    Currently in Pain?  Yes    Pain Score  4     Pain Location  Arm   mid lateral upper arm   Pain Orientation  Left    Pain Descriptors / Indicators  Radiating    Pain Type  Acute pain    Pain Radiating Towards  shoulder blade    Pain Onset  More than a month ago    Pain Frequency  Intermittent    Aggravating Factors   reaching is getting better.    Pain Relieving Factors  moist heat makes it feel a little better         [facility] Assessment - 12/11/19 0001      AROM   Left Shoulder Flexion  147 Degrees    Left Shoulder ABduction  147 Degrees    Left Shoulder External Rotation  65 Degrees  [facility] Adult PT Treatment/Exercise - 12/11/19 0001      Shoulder Exercises: Supine   Protraction  AROM;Both;10 reps    Horizontal ABduction  Strengthening;Both;5 reps;Theraband    Theraband Level (Shoulder Horizontal ABduction)  Level 1 (Yellow)    External Rotation  Strengthening;Both;5 reps;Theraband    Theraband Level (Shoulder External Rotation)  Level 1 (Yellow)    Flexion  Strengthening;Both;5 reps;Theraband    Theraband Level (Shoulder Flexion)  Level 1 (Yellow)    Diagonals  Strengthening;Both;5 reps;Theraband   difficulty reaching up with left shoulder    Theraband Level (Shoulder Diagonals)  Level 1 (Yellow)    Other Supine Exercises  dowel rod flexion     Other Supine Exercises  small circles with hand pointed to ceiling       Shoulder Exercises: Sidelying   Flexion  AAROM      Modalities   Modalities  Moist Heat      Moist Heat Therapy   Number Minutes Moist Heat  10 Minutes    Moist Heat Location  Shoulder;Cervical       Manual Therapy   Manual Therapy  Soft tissue mobilization;Scapular mobilization    Manual therapy comments  pt responds well to soft tissue work with less trigger points in muscle     Joint Mobilization  GH glides in all directions     Soft tissue mobilization  in right sidelying and with thick massage cream to left deltoid, scapular and cervical muscles.  Pt with increased tightess and trigger points in upper trap  Very tender cervical muscles with pain shooting down top of arm to elbow     Scapular Mobilization  in right sidelying, inferior glides for cervical stretch              PT Education - 12/11/19 1445    Education Details  supine scapular series    Person(Debbie) Educated  Patient    Methods  Explanation;Demonstration;Handout    Comprehension  Verbalized understanding;Returned demonstration          PT Long Term Goals - 12/11/19 1450      PT LONG TERM GOAL #1   Title  Pt will report she can reach overhead to get something off a shelf with 50% less pain    Time  4    Period  Weeks    Status  On-going      PT LONG TERM GOAL #2   Title  Pt will have 140 degrees of left shoulder abduction so that she can perform her activities of daily living easier    Baseline  147 on 12/11/2019    Status  Achieved      PT LONG TERM GOAL #3   Title  Pt will decrease her Quick DASH score to < 20 indicating a functional improvment of left shoulder    Baseline  47.73 on 11/27/2019    Time  4    Period  Weeks    Status  On-going            Plan - 12/11/19 1451    Clinical Impression Statement  Pt is making progress with decreased pain, decreased trigger points and increased range of motion at end of session today.  Upgraded to supine scapular series with yellow theraband today    Personal Factors and Comorbidities  Comorbidity 2    Comorbidities  MS, previous chemo    Examination-Activity Limitations  Reach Overhead;Lift;Hygiene/Grooming;Dressing;Bathing;Toileting     Stability/Clinical Decision Making  Stable/Uncomplicated  Rehab Potential  Good    PT Frequency  2x / week    PT Duration  4 weeks    PT Treatment/Interventions  ADLs/Self Care Home Management;Iontophoresis 70m/ml Dexamethasone;Electrical Stimulation;Moist Heat;Therapeutic activities;Therapeutic exercise;Neuromuscular re-education;Patient/family education;Manual techniques;Manual lymph drainage;Passive range of motion;Taping;Joint Manipulations    PT Next Visit Plan  cont moist pack to neck and left shoulder , then soft tissue work to neck and upper quadrant with manual techniques and PROM for increasing ROM including joint mobilization and soft tissue work as needed, progress exericse as indicated, may abe ready for [place] soon    Consulted and Agree with Plan of Care  Patient       Patient will benefit from skilled therapeutic intervention in order to improve the following deficits and impairments:  Decreased range of motion, Decreased strength, Increased fascial restricitons, Impaired UE functional use, Postural dysfunction, Impaired perceived functional ability, Increased muscle spasms, Pain  Visit Diagnosis: Stiffness of left shoulder joint  Acute pain of left shoulder  Muscle weakness (generalized)  Abnormal posture     Problem List Patient Active Problem List   Diagnosis Date Noted  . Breast cancer, right ([place] 04/30/2019  . Port-A-Cath in place 02/13/2019  . Syncope 01/19/2019  . Nausea vomiting and diarrhea 01/19/2019  . Leukocytosis 01/19/2019  . Malignant neoplasm of upper-outer quadrant of right breast in female, estrogen receptor positive ([place] 11/27/2018  . Neurogenic claudication due to lumbar spinal stenosis 09/19/2018  . Urinary tract infection without hematuria 08/10/2018  . Chronic left-sided thoracic back pain 07/20/2018  . Tendon nodule 04/21/2018  . Solar lentigo 04/21/2018  . Preventative health care 04/20/2018  . Acute cystitis without hematuria  03/16/2018  . Gait disturbance 01/12/2018  . Polyuria 12/08/2017  . Easy bruising 08/31/2017  . Chronic obstructive pulmonary disease ([place] 11/30/2016  . Essential hypertension 01/26/2016  . Migraine headache 06/23/2014  . Multiple sclerosis ([place] 06/23/2014  . Paresthesias 06/23/2014  . Restless leg syndrome 06/23/2014  . Hearing loss, bilateral 06/11/2014  . Major depression, recurrent, chronic ([place] 06/11/2014  . Cervical herniated disc 06/11/2014  . Cigarette nicotine dependence without complication 032/44/0102 . Lumbar herniated disc 06/11/2014  . Perimenopausal symptoms 06/11/2014   TDonato Heinz BOwens SharkPT  BNorwood Levo2/16/2021, 2:53 PM  [place] [id number]: [phone]  Fax:  [phone] Name: SJeraldin FeslerMRN: [id number] of Birth: [birth year]

## 2019-12-12 ENCOUNTER — Inpatient Hospital Stay: Payer: Medicare HMO | Admitting: Hematology and Oncology

## 2019-12-12 ENCOUNTER — Inpatient Hospital Stay: Payer: Medicare HMO

## 2019-12-12 ENCOUNTER — Inpatient Hospital Stay: Payer: Medicare HMO | Attending: Hematology and Oncology

## 2019-12-12 ENCOUNTER — Encounter: Payer: Self-pay | Admitting: *Deleted

## 2019-12-12 ENCOUNTER — Other Ambulatory Visit: Payer: Self-pay

## 2019-12-12 VITALS — BP 117/68 | Resp 20

## 2019-12-12 DIAGNOSIS — C50411 Malignant neoplasm of upper-outer quadrant of right female breast: Secondary | ICD-10-CM

## 2019-12-12 DIAGNOSIS — Z170 Estrogen receptor positive status [ER+]: Secondary | ICD-10-CM

## 2019-12-12 DIAGNOSIS — T451X5D Adverse effect of antineoplastic and immunosuppressive drugs, subsequent encounter: Secondary | ICD-10-CM | POA: Diagnosis not present

## 2019-12-12 DIAGNOSIS — Z79811 Long term (current) use of aromatase inhibitors: Secondary | ICD-10-CM | POA: Insufficient documentation

## 2019-12-12 DIAGNOSIS — Z9221 Personal history of antineoplastic chemotherapy: Secondary | ICD-10-CM | POA: Diagnosis not present

## 2019-12-12 DIAGNOSIS — G620 Drug-induced polyneuropathy: Secondary | ICD-10-CM | POA: Insufficient documentation

## 2019-12-12 DIAGNOSIS — Z9011 Acquired absence of right breast and nipple: Secondary | ICD-10-CM | POA: Insufficient documentation

## 2019-12-12 DIAGNOSIS — Z5112 Encounter for antineoplastic immunotherapy: Secondary | ICD-10-CM | POA: Diagnosis not present

## 2019-12-12 DIAGNOSIS — Z79899 Other long term (current) drug therapy: Secondary | ICD-10-CM | POA: Insufficient documentation

## 2019-12-12 DIAGNOSIS — Z791 Long term (current) use of non-steroidal anti-inflammatories (NSAID): Secondary | ICD-10-CM | POA: Insufficient documentation

## 2019-12-12 DIAGNOSIS — Z7982 Long term (current) use of aspirin: Secondary | ICD-10-CM | POA: Diagnosis not present

## 2019-12-12 DIAGNOSIS — Z95828 Presence of other vascular implants and grafts: Secondary | ICD-10-CM

## 2019-12-12 LAB — CMP (CANCER CENTER ONLY)
ALT: 44 U/L (ref 0–44)
AST: 35 U/L (ref 15–41)
Albumin: 4.2 g/dL (ref 3.5–5.0)
Alkaline Phosphatase: 85 U/L (ref 38–126)
Anion gap: 8 (ref 5–15)
BUN: 13 mg/dL (ref 6–20)
CO2: 30 mmol/L (ref 22–32)
Calcium: 8.9 mg/dL (ref 8.9–10.3)
Chloride: 103 mmol/L (ref 98–111)
Creatinine: 0.68 mg/dL (ref 0.44–1.00)
GFR, Est AFR Am: >60 mL/min (ref >60)
GFR, Estimated: >60 mL/min (ref >60)
Glucose, Bld: 93 mg/dL (ref 70–99)
Potassium: 3.7 mmol/L (ref 3.5–5.1)
Sodium: 141 mmol/L (ref 135–145)
Total Bilirubin: 0.4 mg/dL (ref 0.3–1.2)
Total Protein: 7.3 g/dL (ref 6.5–8.1)

## 2019-12-12 LAB — CBC WITH DIFFERENTIAL (CANCER CENTER ONLY)
Abs Immature Granulocytes: 0 10*3/uL (ref 0.00–0.07)
Basophils Absolute: 0 10*3/uL (ref 0.0–0.1)
Basophils Relative: 0 %
Eosinophils Absolute: 0 10*3/uL (ref 0.0–0.5)
Eosinophils Relative: 1 %
HCT: 41.3 % (ref 36.0–46.0)
Hemoglobin: 13.9 g/dL (ref 12.0–15.0)
Immature Granulocytes: 0 %
Lymphocytes Relative: 15 %
Lymphs Abs: 0.6 10*3/uL — ABNORMAL LOW (ref 0.7–4.0)
MCH: 31.4 pg (ref 26.0–34.0)
MCHC: 33.7 g/dL (ref 30.0–36.0)
MCV: 93.2 fL (ref 80.0–100.0)
Monocytes Absolute: 0.4 10*3/uL (ref 0.1–1.0)
Monocytes Relative: 11 %
Neutro Abs: 2.9 10*3/uL (ref 1.7–7.7)
Neutrophils Relative %: 73 %
Platelet Count: 157 10*3/uL (ref 150–400)
RBC: 4.43 MIL/uL (ref 3.87–5.11)
RDW: 14 % (ref 11.5–15.5)
WBC Count: 4 10*3/uL (ref 4.0–10.5)
nRBC: 0 % (ref 0.0–0.2)

## 2019-12-12 MED ORDER — SODIUM CHLORIDE 0.9% FLUSH
10.0000 mL | INTRAVENOUS | Status: DC | PRN
  Administered 2019-12-12: 10 mL
  Filled 2019-12-12: qty 10

## 2019-12-12 MED ORDER — DIPHENHYDRAMINE HCL 25 MG PO CAPS
50.0000 mg | ORAL_CAPSULE | Freq: Once | ORAL | Status: AC
  Administered 2019-12-12: 50 mg via ORAL

## 2019-12-12 MED ORDER — DIPHENHYDRAMINE HCL 25 MG PO CAPS
ORAL_CAPSULE | ORAL | Status: AC
  Filled 2019-12-12: qty 1

## 2019-12-12 MED ORDER — ACETAMINOPHEN 325 MG PO TABS
650.0000 mg | ORAL_TABLET | Freq: Once | ORAL | Status: AC
  Administered 2019-12-12: 650 mg via ORAL

## 2019-12-12 MED ORDER — SODIUM CHLORIDE 0.9 % IV SOLN
3.0000 mg/kg | Freq: Once | INTRAVENOUS | Status: AC
  Administered 2019-12-12: 160 mg via INTRAVENOUS
  Filled 2019-12-12: qty 8

## 2019-12-12 MED ORDER — SODIUM CHLORIDE 0.9% FLUSH
10.0000 mL | Freq: Once | INTRAVENOUS | Status: DC
  Filled 2019-12-12: qty 10

## 2019-12-12 MED ORDER — SODIUM CHLORIDE 0.9 % IV SOLN
Freq: Once | INTRAVENOUS | Status: AC
  Filled 2019-12-12: qty 250

## 2019-12-12 MED ORDER — HEPARIN SOD (PORK) LOCK FLUSH 100 UNIT/ML IV SOLN
500.0000 [IU] | Freq: Once | INTRAVENOUS | Status: AC | PRN
  Administered 2019-12-12: 500 [IU]
  Filled 2019-12-12: qty 5

## 2019-12-12 MED ORDER — DIPHENHYDRAMINE HCL 25 MG PO CAPS
ORAL_CAPSULE | ORAL | Status: AC
  Filled 2019-12-12: qty 2

## 2019-12-12 MED ORDER — ACETAMINOPHEN 325 MG PO TABS
ORAL_TABLET | ORAL | Status: AC
  Filled 2019-12-12: qty 2

## 2019-12-12 NOTE — Patient Instructions (Signed)
[facility] Discharge Instructions for Patients Receiving Chemotherapy  Today you received the following chemotherapy agents Kadcyla  To help prevent nausea and vomiting after your treatment, we encourage you to take your nausea medication as directed   If you develop nausea and vomiting that is not controlled by your nausea medication, call the clinic.   BELOW ARE SYMPTOMS THAT SHOULD BE REPORTED IMMEDIATELY:  *FEVER GREATER THAN 100.5 F  *CHILLS WITH OR WITHOUT FEVER  NAUSEA AND VOMITING THAT IS NOT CONTROLLED WITH YOUR NAUSEA MEDICATION  *UNUSUAL SHORTNESS OF BREATH  *UNUSUAL BRUISING OR BLEEDING  TENDERNESS IN MOUTH AND THROAT WITH OR WITHOUT PRESENCE OF ULCERS  *URINARY PROBLEMS  *BOWEL PROBLEMS  UNUSUAL RASH Items with * indicate a potential emergency and should be followed up as soon as possible.  Feel free to call the clinic should you have any questions or concerns. The clinic phone number is [phone].  Please show the CHEMO ALERT CARD at check-in to the Emergency Department and triage nurse.   

## 2019-12-12 NOTE — Assessment & Plan Note (Addendum)
11/21/2018:Screening detected right breast mass upper outer quadrant, in addition to areas of calcifications which were not biopsied. By ultrasound she had 2 breast masses 12 o'clock position 2.9 cm: Grade 2 IDC with high-grade DCIS ER 100%, PR 70%, Ki-67 10%, HER-2 3+ by IHC and 11 o'clock position 1 cm, ER 90%, PR 50%, Ki-67 15%, HER-2 3+ by IHC, T2N0 stage Ib  Neoadjuvant chemotherapy with TCH Perjeta x6 cycles 12/12/2018-03/27/2019  04/30/2019:Right mastectomy: Grade 2 IDC, 3.9 cm, high-grade DCIS, margins are negative, 0/2 lymph nodes negative, ER 95%, PR 80%, HER-2 2+ equivocal, T2 N0  Treatment plan: 1.Adjuvant Kadcylastarted 05/24/2019 2.Adjuvant antiestrogen therapy with anastrozole 1 mg p.o. daily x7 yearsstarted 05/24/2019 ----------------------------------------------------------------------------------------------------------------------------- Current treatment: Cycle10Kadcyla maintenance(last Kadcyla to be given 11/29/2019) Labs reviewed  Kadcyla toxicities: 1.Loss of appetite:  Improved 2.Fatigue:  Stable 3.Chemo-induced peripheral neuropathy:Probably related to MS but being monitored, reduced the dosage of Kadcyla  Low back pain with pain radiating down the leg:On Percocets 09/24/2019: MRI lumbar spine: Negative for metastatic disease. Mild degenerative changes withoutdisc protrusion or stenosis 09/24/2019: Bone scan: Nonspecific uptake right 11th rib  Anastrozole toxicities: Denies any hot flashes or myalgias. Today is the last dose of Kadcyla.  Return to clinic in 3 months for follow-up.

## 2019-12-13 ENCOUNTER — Encounter: Payer: Medicare HMO | Admitting: Physical Therapy

## 2019-12-14 NOTE — Telephone Encounter (Signed)
Refills approved.

## 2019-12-18 ENCOUNTER — Encounter: Payer: Medicare HMO | Admitting: Physical Therapy

## 2019-12-20 ENCOUNTER — Other Ambulatory Visit: Payer: Self-pay

## 2019-12-20 ENCOUNTER — Ambulatory Visit: Payer: Medicare HMO | Admitting: Physical Therapy

## 2019-12-20 ENCOUNTER — Ambulatory Visit (HOSPITAL_BASED_OUTPATIENT_CLINIC_OR_DEPARTMENT_OTHER)
Admission: RE | Admit: 2019-12-20 | Discharge: 2019-12-20 | Disposition: A | Discharge status: Home / Self Care | Payer: Medicare HMO | Source: Ambulatory Visit | Attending: Internal Medicine | Admitting: Internal Medicine

## 2019-12-20 ENCOUNTER — Ambulatory Visit (HOSPITAL_COMMUNITY)
Admission: RE | Admit: 2019-12-20 | Discharge: 2019-12-20 | Disposition: A | Discharge status: Home / Self Care | Payer: Medicare HMO | Source: Ambulatory Visit | Attending: Internal Medicine | Admitting: Internal Medicine

## 2019-12-20 ENCOUNTER — Encounter: Payer: Self-pay | Admitting: Physical Therapy

## 2019-12-20 ENCOUNTER — Encounter (HOSPITAL_COMMUNITY): Payer: Self-pay | Admitting: Internal Medicine

## 2019-12-20 VITALS — BP 114/70 | HR 78 | Wt 119.0 lb

## 2019-12-20 DIAGNOSIS — Z79899 Other long term (current) drug therapy: Secondary | ICD-10-CM | POA: Insufficient documentation

## 2019-12-20 DIAGNOSIS — C50411 Malignant neoplasm of upper-outer quadrant of right female breast: Secondary | ICD-10-CM | POA: Diagnosis not present

## 2019-12-20 DIAGNOSIS — I10 Essential (primary) hypertension: Secondary | ICD-10-CM | POA: Insufficient documentation

## 2019-12-20 DIAGNOSIS — R293 Abnormal posture: Secondary | ICD-10-CM

## 2019-12-20 DIAGNOSIS — M6281 Muscle weakness (generalized): Secondary | ICD-10-CM

## 2019-12-20 DIAGNOSIS — M25612 Stiffness of left shoulder, not elsewhere classified: Secondary | ICD-10-CM | POA: Diagnosis not present

## 2019-12-20 DIAGNOSIS — Z170 Estrogen receptor positive status [ER+]: Secondary | ICD-10-CM

## 2019-12-20 DIAGNOSIS — M25611 Stiffness of right shoulder, not elsewhere classified: Secondary | ICD-10-CM | POA: Diagnosis not present

## 2019-12-20 DIAGNOSIS — Z791 Long term (current) use of non-steroidal anti-inflammatories (NSAID): Secondary | ICD-10-CM | POA: Diagnosis not present

## 2019-12-20 DIAGNOSIS — R55 Syncope and collapse: Secondary | ICD-10-CM | POA: Diagnosis not present

## 2019-12-20 DIAGNOSIS — G35 Multiple sclerosis: Secondary | ICD-10-CM | POA: Insufficient documentation

## 2019-12-20 DIAGNOSIS — M25512 Pain in left shoulder: Secondary | ICD-10-CM | POA: Diagnosis not present

## 2019-12-20 DIAGNOSIS — Z01818 Encounter for other preprocedural examination: Secondary | ICD-10-CM | POA: Insufficient documentation

## 2019-12-20 DIAGNOSIS — Z720 Tobacco use: Secondary | ICD-10-CM

## 2019-12-20 DIAGNOSIS — J45909 Unspecified asthma, uncomplicated: Secondary | ICD-10-CM | POA: Diagnosis not present

## 2019-12-20 DIAGNOSIS — F17210 Nicotine dependence, cigarettes, uncomplicated: Secondary | ICD-10-CM | POA: Insufficient documentation

## 2019-12-20 NOTE — Progress Notes (Signed)
  Echocardiogram 2D Echocardiogram has been performed.  Debbie Watts 12/20/2019, 1:44 PM

## 2019-12-20 NOTE — Patient Instructions (Signed)
Strengthening: Resisted Flexion    Cancer Rehab [phone]    Hold tubing with left arm at side. Pull forward and up. Move shoulder through pain-free range of motion. Repeat _5-10___ times per set. Do _1-2___ sessions per day.  Strengthening: Resisted Internal Rotation    Hold tubing in left hand, elbow at side and forearm out. Rotate forearm in across body. Repeat _5-10___ times per set. Do _1-2___ sessions per day.  Strengthening: Resisted Extension    Hold tubing in left hand, arm forward. Pull arm back, elbow straight. Repeat __5-10__ times per set. Do __1-2__ sessions per day.   Strengthening: Resisted External Rotation    Hold tubing in left hand, elbow at side and forearm across body. Rotate forearm out. Repeat _5-10___ times per set. Do __1-2__ sessions per day.    

## 2019-12-20 NOTE — Progress Notes (Signed)
Cardio-Oncology Clinic Note  Date:  12/20/2019   ID:  Debbie Watts, DOB [birth year], MRN [id number]  Location: Home  Provider location: [facility] Type of Visit: New patient (consult)  PCP:  Neva Seat, MD  Cardiologist:  No primary care provider on file. Primary HF: Debbie Watts Referring: Debbie Watts  Chief Complaint: Breast CA   History of Present Illness:  [name] is a 59 y/o woman with HTN, migraines, multiple sclerosis and right breast CA referred by Dr. Lindi Watts for enrollment into the cardio-oncology clinic for monitoring during her chemotherapy.  Diagnosed with R breast CA in 2/20. Started neo-adjuvant chemo with TCH-P in 2/20.   Not overly activity.On disability due to MS. 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   Has finished Herceptin last week. Feels good. No swelling, orthopnea or PND. No edema.  Echo today 12/20/19 EF 65%  Echo today EF 55-60% GLS -20.4%   Echo today EF 60-65% GLS -16.9%  Echo 12/06/18 EF 65-70% GLS -19.2 Echo 03/07/19  EF 60-65% GLS -13.4% (poor tracking)  SUMMARY OF ONCOLOGIC HISTORY:       Malignant neoplasm of upper-outer quadrant of right breast in female, estrogen receptor positive ([place])   11/21/2018 Initial Diagnosis    Screening detected right breast mass upper outer quadrant, in addition to areas of calcifications which were not biopsied.  By ultrasound she had 2 breast masses 12 o'clock position 2.9 cm: Grade 2 IDC with high-grade DCIS ER 100%, PR 70%, Ki-67 10%, HER-2 3+ by IHC and 11 o'clock position 1 cm, ER 90%, PR 50%, Ki-67 15%, HER-2 3+ by IHC, T2N0 stage Ib    11/29/2018 Cancer Staging    Staging form: Breast, AJCC 8th Edition - Clinical stage from 11/29/2018: Stage IB (cT2, cN0, cM0, G3, ER+, PR+, HER2+) - Signed by Debbie Lose, MD on 11/29/2018    12/12/2018 -  Neo-Adjuvant Chemotherapy    Neoadjuvant chemotherapy with [facility] Perjeta    01/18/2019 - 01/20/2019 Hospital Admission     Syncope     Past Medical History:  Diagnosis Date  . Arthritis   . Asthma   . Cancer ([place])    chemo for breast ca  x1mo  . Depression   . Deviated nasal septum   . Hearing loss 06/11/2014   Bilateral  . Herniated cervical disc   . Hypertension   . Migraine   . Miscarriage    2  . MS (multiple sclerosis) ([place]    Past Surgical History:  Procedure Laterality Date  . BREAST BIOPSY Right 11/21/2018   Malignant  . HEMATOMA EVACUATION Right 05/01/2019   Procedure: EVACUATION HEMATOMA;  Surgeon: WRolm Bookbinder MD;  [place] [facility]  Service: General;  Laterality: Right;  . MASTECTOMY Right 04/30/2019  . MASTECTOMY W/ SENTINEL NODE BIOPSY Right 04/30/2019   Procedure: RIGHT MASTECTOMY WITH  RIGHT AXILLARY SENTINEL LYMPH NODE BIOPSY INJECT BLUE DYE;  Surgeon: WRolm Bookbinder MD;  [place] [facility]  Service: General;  Laterality: Right;  . NASAL SEPTUM SURGERY    . SIMPLE MASTECTOMY WITH AXILLARY SENTINEL NODE BIOPSY Right 04/30/2019  . spinal injections       Current Outpatient Medications  Medication Sig Dispense Refill  . albuterol (VENTOLIN HFA) 108 (90 Base) MCG/ACT inhaler Inhale 2 puffs into the lungs every 6 (six) hours as needed for wheezing or shortness of breath.    . anastrozole (ARIMIDEX) 1 MG tablet Take 1 tablet (1 mg total) by mouth daily. 90 tablet  3  . aspirin-acetaminophen-caffeine (EXCEDRIN MIGRAINE) 250-250-65 MG tablet Take 2 tablets by mouth every 6 (six) hours as needed for migraine.    . betamethasone valerate lotion (VALISONE) 0.1 % Apply 1 application topically daily as needed for irritation.     . carbidopa-levodopa (SINEMET IR) 10-100 MG tablet Take 1 tablet by mouth at bedtime. 90 tablet 3  . diclofenac (VOLTAREN) 75 MG EC tablet TAKE 1 TABLET BY MOUTH TWICE A DAY 60 tablet 5  . diphenhydrAMINE (BENADRYL) 25 MG tablet Take 25 mg by mouth at bedtime as needed.     . fluticasone (FLONASE) 50 MCG/ACT nasal spray Place 2 sprays into both nostrils  at bedtime.    . gabapentin (NEURONTIN) 300 MG capsule Take 2 capsules (600 mg total) by mouth 2 (two) times daily. 360 capsule 3  . lisinopril (ZESTRIL) 5 MG tablet TAKE 1 TABLET BY MOUTH EVERY DAY 90 tablet 1  . methocarbamol (ROBAXIN) 500 MG tablet Take 1 tablet (500 mg total) by mouth every 8 (eight) hours as needed for muscle spasms. 20 tablet 1  . Multiple Vitamin (MULTIVITAMIN WITH MINERALS) TABS tablet Take 2 tablets by mouth daily.    Marland Kitchen omeprazole (PRILOSEC OTC) 20 MG tablet Take 20 mg by mouth daily as needed (acid reflux).    Marland Kitchen oxyCODONE-acetaminophen (PERCOCET/ROXICET) 5-325 MG tablet Take 1 tablet by mouth every 8 (eight) hours as needed for severe pain. 60 tablet 0  . PARoxetine (PAXIL) 20 MG tablet TAKE 1 TABLET BY MOUTH EVERY DAY 90 tablet 1  . TECFIDERA 240 MG CPDR Take 1 capsule by mouth twice daily 180 capsule 3  . traMADol (ULTRAM) 50 MG tablet Take 1 tablet (50 mg total) by mouth 2 (two) times daily as needed. 60 tablet 3   No current facility-administered medications for this encounter.    Allergies:   Procaine   Social History:  The patient  reports that she has been smoking cigarettes. She has a 22.00 pack-year smoking history. She has never used smokeless tobacco. She reports current alcohol use. She reports current drug use. Drug: Marijuana.   Family History:  The patient's family history includes Cancer in her maternal uncle. She was adopted.   ROS:  Please see the history of present illness.   All other systems are personally reviewed and negative.   Vitals:   12/20/19 1354  BP: 114/70  Pulse: 78  SpO2: 97%     Exam:  General:  Thin No resp difficulty HEENT: normal Neck: supple. no JVD. Carotids 2+ bilat; no bruits. No lymphadenopathy or thryomegaly appreciated. Cor: PMI nondisplaced. Regular rate & rhythm. No rubs, gallops or murmurs. Lungs: clear with decreased breath sounds Abdomen: soft, nontender, nondistended. No hepatosplenomegaly. No bruits or  masses. Good bowel sounds. Extremities: no cyanosis, clubbing, rash, edema Neuro: alert & orientedx3, cranial nerves grossly intact. moves all 4 extremities w/o difficulty. Affect pleasant    Recent Labs: 01/19/2019: Magnesium 2.1 12/12/2019: ALT 44; BUN 13; Creatinine 0.68; Hemoglobin 13.9; Platelet Count 157; Potassium 3.7; Sodium 141  Personally reviewed   Wt Readings from Last 3 Encounters:  12/20/19 54 kg (119 lb)  12/12/19 53 kg (116 lb 12.3 oz)  11/21/19 55 kg (121 lb 4.8 oz)      ASSESSMENT AND PLAN:  1. R breast cancer - I reviewed echos personally. EF and Doppler parameters stable. No HF on exam. She has completed herceptin. F/u PRN  2. Syncope - Suspect due to high vagal tone associated with defecation syncope.  W/u negative - no recurrence   3. Tobacco use, ongoing -  Still smoking - encouraged cessation   Signed, Glori Bickers, MD  12/20/2019 2:32 PM  [facility] [place] [address] [facility] [place] [address] [phone] (office) [phone] (fax)

## 2019-12-20 NOTE — Therapy (Signed)
Debbie Watts, [place], [address] Phone: [phone]   Fax:  [phone]  Physical Therapy Treatment  Patient Details  Name: Debbie Watts MRN: [id number] Date of Birth: [birth year] Referring Provider (PT): Lindi Adie   Encounter Date: 12/20/2019  PT End of Session - 12/20/19 1836    Visit Number  5    Number of Visits  9    Date for PT Re-Evaluation  12/26/19    PT Start Time  1600    PT Stop Time  1645    PT Time Calculation (min)  45 min    Activity Tolerance  Patient tolerated treatment well    Behavior During Therapy  [facility] for tasks assessed/performed       Past Medical History:  Diagnosis Date  . Arthritis   . Asthma   . Cancer ([place])    chemo for breast ca  x48mo  . Depression   . Deviated nasal septum   . Hearing loss 06/11/2014   Bilateral  . Herniated cervical disc   . Hypertension   . Migraine   . Miscarriage    2  . MS (multiple sclerosis) ([place]     Past Surgical History:  Procedure Laterality Date  . BREAST BIOPSY Right 11/21/2018   Malignant  . HEMATOMA EVACUATION Right 05/01/2019   Procedure: EVACUATION HEMATOMA;  Surgeon: WRolm Bookbinder MD;  Location[place] [facility]  Service: General;  Laterality: Right;  . MASTECTOMY Right 04/30/2019  . MASTECTOMY W/ SENTINEL NODE BIOPSY Right 04/30/2019   Procedure: RIGHT MASTECTOMY WITH  RIGHT AXILLARY SENTINEL LYMPH NODE BIOPSY INJECT BLUE DYE;  Surgeon: WRolm Bookbinder MD;  [facility]  Service: General;  Laterality: Right;  . NASAL SEPTUM SURGERY    . SIMPLE MASTECTOMY WITH AXILLARY SENTINEL NODE BIOPSY Right 04/30/2019  . spinal injections      There were no vitals filed for this visit.  Subjective Assessment - 12/20/19 1604    Subjective  Pt says she is still having pain in her shoulder especially when she is reaching up.. She reports doing dowel and band exercises at home every other day and also reaching up the wall  She had  her last infusion last week and will have her port out in the next few months.    Pertinent History  Rt mastectomy 04/30/19 with SLNB due to grade 2 IDC, high grade DCIS 0/2 lymph nodes. ER/PR positive, HER2 equivocal and then Rt breast hematoma evacuation on 05/01/19 by Dr. WDonne Watts  Neo adjuvant chemotherapy [facility] Perjeta started 12/12/18 to current. Future treatment to include Kadcyla and anastrozole x 7 years. Other history includes MS ( currently under control so will not address this episode) , HTN, OA, asthma, bilateral hearing loss    Patient Stated Goals  to get rid of the shoulder pain    Currently in Pain?  Yes   when reaching   Pain Score  8     Pain Location  Shoulder    Pain Orientation  Left    Pain Descriptors / Indicators  Shooting    Pain Type  Chronic pain    Pain Radiating Towards  anterior shoulder that runs down into her arm sometimes into her hand    Pain Onset  More than a month ago    Pain Frequency  Intermittent                       [facility] Adult PT Treatment/Exercise -  12/20/19 0001      Exercises   Exercises  Shoulder      Shoulder Exercises: Sidelying   External Rotation  AROM;Left;10 reps    External Rotation Weight (lbs)  2      Shoulder Exercises: Standing   External Rotation  Strengthening;Left;5 reps;Theraband    Theraband Level (Shoulder External Rotation)  Level 2 (Red)    Internal Rotation  Strengthening;Left;5 reps;Theraband    Theraband Level (Shoulder Internal Rotation)  Level 2 (Red)    Flexion  Strengthening;Left;5 reps;Theraband    Theraband Level (Shoulder Flexion)  Level 2 (Red)    Extension  Strengthening;Left;5 reps;Theraband    Theraband Level (Shoulder Extension)  Level 2 (Red)      Shoulder Exercises: Stretch   Table Stretch - Flexion  10 seconds    Table Stretch -Flexion Limitations  left shoulder       Moist Heat Therapy   Number Minutes Moist Heat  10 Minutes    Moist Heat Location  Shoulder;Cervical      Manual  Therapy   Manual Therapy  Soft tissue mobilization;Scapular mobilization    Manual therapy comments  pt responds well to soft tissue work with less trigger points in muscle     Joint Mobilization  GH glides in all directions     Soft tissue mobilization  in right sidelying and with thick massage cream to left deltoid, scapular and cervical muscles.  Pt with increased tightess and trigger points in upper trap  Very tender cervical muscles with pain shooting down top of arm to elbow     Scapular Mobilization  in right sidelying, inferior glides for cervical stretch              PT Education - 12/20/19 1612    Education Details  rockwoods exercises          PT Long Term Goals - 12/11/19 1450      PT LONG TERM GOAL #1   Title  Pt will report she can reach overhead to get something off a shelf with 50% less pain    Time  4    Period  Weeks    Status  On-going      PT LONG TERM GOAL #2   Title  Pt will have 140 degrees of left shoulder abduction so that she can perform her activities of daily living easier    Baseline  147 on 12/11/2019    Status  Achieved      PT LONG TERM GOAL #3   Title  Pt will decrease her Quick DASH score to < 20 indicating a functional improvment of left shoulder    Baseline  47.73 on 11/27/2019    Time  4    Period  Weeks    Status  On-going            Plan - 12/20/19 1838    Clinical Impression Statement  Pt continues to have shoulder pain and tightness with musculare trigger points. Upgraded home exercise today.    Comorbidities  MS, previous chemo    Stability/Clinical Decision Making  Stable/Uncomplicated    PT Frequency  2x / week    PT Duration  4 weeks    PT Treatment/Interventions  ADLs/Self Care Home Management;Iontophoresis 24m/ml Dexamethasone;Electrical Stimulation;Moist Heat;Therapeutic activities;Therapeutic exercise;Neuromuscular re-education;Patient/family education;Manual techniques;Manual lymph drainage;Passive range of  motion;Taping;Joint Manipulations    PT Next Visit Plan  cont moist pack to neck and left shoulder , then soft tissue work to  neck and upper quadrant with manual techniques and PROM for increasing ROM including joint mobilization and soft tissue work as needed, progress exericse as indicated,    Consulted and Agree with Plan of Care  Patient       Patient will benefit from skilled therapeutic intervention in order to improve the following deficits and impairments:  Decreased range of motion, Decreased strength, Increased fascial restricitons, Impaired UE functional use, Postural dysfunction, Impaired perceived functional ability, Increased muscle spasms, Pain  Visit Diagnosis: Stiffness of left shoulder joint  Acute pain of left shoulder  Muscle weakness (generalized)  Abnormal posture  Stiffness of right shoulder, not elsewhere classified     Problem List Patient Active Problem List   Diagnosis Date Noted  . Breast cancer, right ([place]) 04/30/2019  . Port-A-Cath in place 02/13/2019  . Syncope 01/19/2019  . Nausea vomiting and diarrhea 01/19/2019  . Leukocytosis 01/19/2019  . Malignant neoplasm of upper-outer quadrant of right breast in female, estrogen receptor positive ([place]) 11/27/2018  . Neurogenic claudication due to lumbar spinal stenosis 09/19/2018  . Urinary tract infection without hematuria 08/10/2018  . Chronic left-sided thoracic back pain 07/20/2018  . Tendon nodule 04/21/2018  . Solar lentigo 04/21/2018  . Preventative health care 04/20/2018  . Acute cystitis without hematuria 03/16/2018  . Gait disturbance 01/12/2018  . Polyuria 12/08/2017  . Easy bruising 08/31/2017  . Chronic obstructive pulmonary disease ([place]) 11/30/2016  . Essential hypertension 01/26/2016  . Migraine headache 06/23/2014  . Multiple sclerosis ([place]) 06/23/2014  . Paresthesias 06/23/2014  . Restless leg syndrome 06/23/2014  . Hearing loss, bilateral 06/11/2014  . Major depression,  recurrent, chronic ([place]) 06/11/2014  . Cervical herniated disc 06/11/2014  . Cigarette nicotine dependence without complication 12/05/1733  . Lumbar herniated disc 06/11/2014  . Perimenopausal symptoms 06/11/2014   Donato Heinz. Owens Shark PT  Norwood Levo 12/20/2019, 6:40 PM  [place], [place], [address] Phone: [phone]   Fax:  [phone]  Name: Nandita Mathenia MRN: [id number] Date of Birth: 03/14/1961

## 2019-12-24 ENCOUNTER — Encounter: Payer: Self-pay | Admitting: Internal Medicine

## 2019-12-24 ENCOUNTER — Ambulatory Visit (INDEPENDENT_AMBULATORY_CARE_PROVIDER_SITE_OTHER): Payer: Medicare HMO | Admitting: Internal Medicine

## 2019-12-24 VITALS — BP 140/76 | HR 83 | Temp 97.9°F | Wt 121.3 lb

## 2019-12-24 DIAGNOSIS — B029 Zoster without complications: Secondary | ICD-10-CM | POA: Diagnosis not present

## 2019-12-24 HISTORY — DX: Zoster without complications: B02.9

## 2019-12-24 MED ORDER — VALACYCLOVIR HCL 1 G PO TABS: 1000.0000 mg | ORAL_TABLET | Freq: Three times a day (TID) | ORAL | 0 refills | Status: AC

## 2019-12-24 NOTE — Progress Notes (Signed)
Internal Medicine Clinic Attending  Case discussed with Dr. Melvin  at the time of the visit.  We reviewed the resident's history and exam and pertinent patient test results.  I agree with the assessment, diagnosis, and plan of care documented in the resident's note.  

## 2019-12-24 NOTE — Patient Instructions (Signed)
Thank you for allowing [place] to care for you  Your rash is consistent with shingles. - Please take Valacyclovir, 3 times a day for 10 days - Take Ibu or other OTC pain reliever for the next 5-7 days for pain   Shingles  Shingles is an infection. It gives you a painful skin rash and blisters that have fluid in them. Shingles is caused by the same germ (virus) that causes chickenpox. Shingles only happens in people who:  Have had chickenpox.  Have been given a shot of medicine (vaccine) to protect against chickenpox. Shingles is rare in this group. The first symptoms of shingles may be itching, tingling, or pain in an area on your skin. A rash will show on your skin a few days or weeks later. The rash is likely to be on one side of your body. The rash usually has a shape like a belt or a band. Over time, the rash turns into fluid-filled blisters. The blisters will break open, change into scabs, and dry up. Medicines may:  Help with pain and itching.  Help you get better sooner.  Help to prevent long-term problems. Follow these instructions at home: Medicines  Take over-the-counter and prescription medicines only as told by your doctor.  Put on an anti-itch cream or numbing cream where you have a rash, blisters, or scabs. Do this as told by your doctor. Helping with itching and discomfort   Put cold, wet cloths (cold compresses) on the area of the rash or blisters as told by your doctor.  Cool baths can help you feel better. Try adding baking soda or dry oatmeal to the water to lessen itching. Do not bathe in hot water. Blister and rash care  Keep your rash covered with a loose bandage (dressing).  Wear loose clothing that does not rub on your rash.  Keep your rash and blisters clean. To do this, wash the area with mild soap and cool water as told by your doctor.  Check your rash every day for signs of infection. Check for: ? More redness, swelling, or pain. ? Fluid or  blood. ? Warmth. ? Pus or a bad smell.  Do not scratch your rash. Do not pick at your blisters. To help you to not scratch: ? Keep your fingernails clean and cut short. ? Wear gloves or mittens when you sleep, if scratching is a problem. General instructions  Rest as told by your doctor.  Keep all follow-up visits as told by your doctor. This is important.  Wash your hands often with soap and water. If soap and water are not available, use hand sanitizer. Doing this lowers your chance of getting a skin infection caused by germs (bacteria).  Your infection can cause chickenpox in people who have never had chickenpox or never got a shot of chickenpox vaccine. If you have blisters that did not change into scabs yet, try not to touch other people or be around other people, especially: ? Babies. ? Pregnant women. ? Children who have areas of red, itchy, or rough skin (eczema). ? Very old people who have transplants. ? People who have a long-term (chronic) sickness, like cancer or AIDS. Contact a doctor if:  Your pain does not get better with medicine.  Your pain does not get better after the rash heals.  You have any signs of infection in the rash area. These signs include: ? More redness, swelling, or pain around the rash. ? Fluid or blood coming from the  rash. ? The rash area feeling warm to the touch. ? Pus or a bad smell coming from the rash. Get help right away if:  The rash is on your face or nose.  You have pain in your face or pain by your eye.  You lose feeling on one side of your face.  You have trouble seeing.  You have ear pain, or you have ringing in your ear.  You have a loss of taste.  Your condition gets worse. Summary  Shingles gives you a painful skin rash and blisters that have fluid in them.  Shingles is an infection. It is caused by the same germ (virus) that causes chickenpox.  Keep your rash covered with a loose bandage (dressing). Wear loose  clothing that does not rub on your rash.  If you have blisters that did not change into scabs yet, try not to touch other people or be around people. This information is not intended to replace advice given to you by your health care provider. Make sure you discuss any questions you have with your health care provider. Document Revised: 02/02/2019 Document Reviewed: 06/15/2017 Elsevier Patient Education  2020 [employer].

## 2019-12-24 NOTE — Assessment & Plan Note (Addendum)
Mrs Tocco reports 3 day of rash at her Left buttocks into her Left leg. The rash is described as painful especially when she sits on the area or it rubs against her clothes. The rash in minimally pruritic. She denies and fevers, chill or other systemic symptoms. She has not been outdoors much due to the pandemic, and her cancer treatment.   Her exam (maculo papular rash in distrubution of nerve with early vesicle formation) and history are most consistent with shingles. Will treat with antiviral to reduce risk for post herpetic neuralgia and OTC pain reliever for pain.  - Valacyclovir 1g TID x10 days - Ibuprofen or other NSAID scheduled for 5 days - Information provided in AVS on ways to further relieve symptoms - RTC in fails to improved or becomes bilateral in the next 1 week

## 2019-12-24 NOTE — Progress Notes (Signed)
   CC: Rash  HPI:  [name] is a 59 y.o. F with PMHx listed below presenting for Rash. Please see the A&P for the status of the patient's chronic medical problems.  Past Medical History:  Diagnosis Date  . Arthritis   . Asthma   . Cancer ([place])    chemo for breast ca  x93mos  . Depression   . Deviated nasal septum   . Hearing loss 06/11/2014   Bilateral  . Herniated cervical disc   . Hypertension   . Migraine   . Miscarriage    2  . MS (multiple sclerosis) ([place])    Review of Systems:  Performed and all others negative.  Physical Exam:  Vitals:   12/24/19 1022  BP: 140/76  Pulse: 83  Temp: 97.9 F (36.6 C)  TempSrc: Oral  SpO2: 100%  Weight: 121 lb 4.8 oz (55 kg)   Physical Exam Constitutional:      General: She is not in acute distress.    Appearance: Normal appearance.  Cardiovascular:     Rate and Rhythm: Normal rate and regular rhythm.     Pulses: Normal pulses.     Heart sounds: Normal heart sounds.  Pulmonary:     Effort: Pulmonary effort is normal. No respiratory distress.     Breath sounds: Normal breath sounds.  Abdominal:     General: Bowel sounds are normal. There is no distension.     Palpations: Abdomen is soft.     Tenderness: There is no abdominal tenderness.  Musculoskeletal:        General: No swelling or deformity.  Skin:    General: Skin is warm and dry.     Findings: Rash present.     Comments: maculopapular rash extending from left paraspinal region for her left leg in the distribution of the S1 nerve.  Neurological:     General: No focal deficit present.     Mental Status: Mental status is at baseline.        Assessment & Plan:   See Encounters Tab for problem based charting.  Patient discussed with Dr. Lynnae January

## 2019-12-26 NOTE — Progress Notes (Signed)
HEMATOLOGY-ONCOLOGY TELEPHONE VISIT PROGRESS NOTE  I connected with Debbie Watts on 12/27/2019 at  8:15 AM EST by telephone and verified that I am speaking with the correct person using two identifiers.  I discussed the limitations, risks, security and privacy concerns of performing an evaluation and management service by telephone and the availability of in person appointments.  I also discussed with the patient that there may be a patient responsible charge related to this service. The patient expressed understanding and agreed to proceed.   History of Present Illness: Debbie Watts is a 59 y.o. female with above-mentioned history of right breast cancer who completed neoadjuvant chemotherapy with [place], underwent a right mastectomy, completed adjuvantKadcyla, and is currently on anti-estrogen therapy with anastrozole. Echo on 12/20/19 showed an ejection fraction of 60-65%. She presents over the phone todayfor follow-up.  Shingles buttocks on valtrex. Currently having Migraines.  Oncology History  Malignant neoplasm of upper-outer quadrant of right breast in female, estrogen receptor positive ([place])  11/21/2018 Initial Diagnosis   Screening detected right breast mass upper outer quadrant, in addition to areas of calcifications which were not biopsied.  By ultrasound she had 2 breast masses 12 o'clock position 2.9 cm: Grade 2 IDC with high-grade DCIS ER 100%, PR 70%, Ki-67 10%, HER-2 3+ by IHC and 11 o'clock position 1 cm, ER 90%, PR 50%, Ki-67 15%, HER-2 3+ by IHC, T2N0 stage Ib   11/29/2018 Cancer Staging   Staging form: Breast, AJCC 8th Edition - Clinical stage from 11/29/2018: Stage IB (cT2, cN0, cM0, G3, ER+, PR+, HER2+) - Signed by Nicholas Lose, MD on 11/29/2018   12/12/2018 -  Neo-Adjuvant Chemotherapy   Neoadjuvant chemotherapy with [facility] Perjeta   01/18/2019 - 01/20/2019 Hospital Admission   Syncope   04/30/2019 Surgery   Right mastectomy: Grade 2 IDC, 3.9 cm, high-grade DCIS,  margins are negative, 0/2 lymph nodes negative, ER 95%, PR 80%, HER-2 2+ equivocal, T2 N0   04/30/2019 Cancer Staging   Staging form: Breast, AJCC 8th Edition - Pathologic stage from 04/30/2019: No Stage Recommended (ypT2, pN0, cM0, G2, ER+, PR+, HER2-) - Signed by Gardenia Phlegm, NP on 05/09/2019   05/24/2019 -  Chemotherapy   The patient had ado-trastuzumab emtansine (KADCYLA) 200 mg in sodium chloride 0.9 % 250 mL chemo infusion, 240 mg, Intravenous, Once, 10 of 10 cycles Dose modification: 3 mg/kg (original dose 3.6 mg/kg, Cycle 5, Reason: Dose not tolerated) Administration: 200 mg (05/24/2019), 200 mg (06/14/2019), 160 mg (08/16/2019), 160 mg (09/06/2019), 160 mg (09/27/2019), 160 mg (10/31/2019), 160 mg (07/05/2019), 160 mg (07/26/2019), 160 mg (11/21/2019), 160 mg (12/12/2019)  for chemotherapy treatment.    05/24/2019 -  Anti-estrogen oral therapy   Anastrozole 1 mg daily     Observations/Objective:     Assessment Plan:  Malignant neoplasm of upper-outer quadrant of right breast in female, estrogen receptor positive ([place]) 11/21/2018:Screening detected right breast mass upper outer quadrant, in addition to areas of calcifications which were not biopsied. By ultrasound she had 2 breast masses 12 o'clock position 2.9 cm: Grade 2 IDC with high-grade DCIS ER 100%, PR 70%, Ki-67 10%, HER-2 3+ by IHC and 11 o'clock position 1 cm, ER 90%, PR 50%, Ki-67 15%, HER-2 3+ by IHC, T2N0 stage Ib  Neoadjuvant chemotherapy with [facility] Perjeta x6 cycles 12/12/2018-03/27/2019  04/30/2019:Right mastectomy: Grade 2 IDC, 3.9 cm, high-grade DCIS, margins are negative, 0/2 lymph nodes negative, ER 95%, PR 80%, HER-2 2+ equivocal, T2 N0  Treatment plan: 1.Adjuvant Kadcylastarted 05/24/2019-  12/12/19 2.Adjuvant antiestrogen therapy with anastrozole 1 mg p.o. daily x7 yearsstarted  05/24/2019 ----------------------------------------------------------------------------------------------------------------------------- Low back pain with pain radiating down the leg:On Percocets 09/24/2019: MRI lumbar spine: Negative for metastatic disease. Mild degenerative changes withoutdisc protrusion or stenosis 09/24/2019: Bone scan: Nonspecific uptake right 11th rib  Anastrozole toxicities: Denies any hot flashes or myalgias. Shingles: 12/21/19 On Valtrex Mammogram 11/23/2019: Benign, left breast MRI scheduled for 01/10/2020 We will call her after that to discuss results. She wants to wait for the MRI result before removing the port.  I will call her after the breast MRI to go over the results.  I discussed the assessment and treatment plan with the patient. The patient was provided an opportunity to ask questions and all were answered. The patient agreed with the plan and demonstrated an understanding of the instructions. The patient was advised to call back or seek an in-person evaluation if the symptoms worsen or if the condition fails to improve as anticipated.   I provided 15 minutes of non-face-to-face time during this encounter.   Rulon Eisenmenger, MD 12/27/2019    I, Molly Dorshimer, am acting as scribe for Nicholas Lose, MD.  I have reviewed the above documentation for accuracy and completeness, and I agree with the above.

## 2019-12-27 ENCOUNTER — Ambulatory Visit: Payer: Medicare HMO

## 2019-12-27 ENCOUNTER — Inpatient Hospital Stay: Payer: Medicare HMO | Attending: Hematology and Oncology | Admitting: Hematology and Oncology

## 2019-12-27 DIAGNOSIS — C50411 Malignant neoplasm of upper-outer quadrant of right female breast: Secondary | ICD-10-CM | POA: Diagnosis not present

## 2019-12-27 DIAGNOSIS — Z170 Estrogen receptor positive status [ER+]: Secondary | ICD-10-CM | POA: Diagnosis not present

## 2019-12-27 NOTE — Assessment & Plan Note (Signed)
11/21/2018:Screening detected right breast mass upper outer quadrant, in addition to areas of calcifications which were not biopsied. By ultrasound she had 2 breast masses 12 o'clock position 2.9 cm: Grade 2 IDC with high-grade DCIS ER 100%, PR 70%, Ki-67 10%, HER-2 3+ by IHC and 11 o'clock position 1 cm, ER 90%, PR 50%, Ki-67 15%, HER-2 3+ by IHC, T2N0 stage Ib  Neoadjuvant chemotherapy with [facility] Perjeta x6 cycles 12/12/2018-03/27/2019  04/30/2019:Right mastectomy: Grade 2 IDC, 3.9 cm, high-grade DCIS, margins are negative, 0/2 lymph nodes negative, ER 95%, PR 80%, HER-2 2+ equivocal, T2 N0  Treatment plan: 1.Adjuvant Kadcylastarted 05/24/2019- 12/12/19 2.Adjuvant antiestrogen therapy with anastrozole 1 mg p.o. daily x7 yearsstarted 05/24/2019 ----------------------------------------------------------------------------------------------------------------------------- Low back pain with pain radiating down the leg:On Percocets 09/24/2019: MRI lumbar spine: Negative for metastatic disease. Mild degenerative changes withoutdisc protrusion or stenosis 09/24/2019: Bone scan: Nonspecific uptake right 11th rib  Anastrozole toxicities: Denies any hot flashes or myalgias.  Mammogram 11/23/2019: Benign, left breast MRI scheduled for 01/10/2020 We will call her after that to discuss results. She wants to wait for the MRI result before removing the port.

## 2019-12-28 ENCOUNTER — Telehealth: Payer: Self-pay | Admitting: Hematology and Oncology

## 2019-12-28 NOTE — Telephone Encounter (Signed)
I left a message regarding phone visit

## 2019-12-31 ENCOUNTER — Other Ambulatory Visit: Payer: Self-pay

## 2019-12-31 ENCOUNTER — Ambulatory Visit: Payer: Medicare HMO | Attending: Hematology and Oncology

## 2019-12-31 DIAGNOSIS — M25512 Pain in left shoulder: Secondary | ICD-10-CM | POA: Insufficient documentation

## 2019-12-31 DIAGNOSIS — M25612 Stiffness of left shoulder, not elsewhere classified: Secondary | ICD-10-CM

## 2019-12-31 DIAGNOSIS — M6281 Muscle weakness (generalized): Secondary | ICD-10-CM | POA: Diagnosis not present

## 2019-12-31 DIAGNOSIS — R293 Abnormal posture: Secondary | ICD-10-CM | POA: Diagnosis not present

## 2019-12-31 NOTE — Therapy (Addendum)
[place], [place], [address] Phone: [phone]   Fax:  [phone]  Physical Therapy Treatment  Patient Details  Name: Karrissa Parchment MRN: [id number] Date of Birth: [birth year] Referring Provider (PT): Lindi Adie   Encounter Date: 12/31/2019  PT End of Session - 12/31/19 6812    Visit Number  6    Number of Visits  9    Date for PT Re-Evaluation  12/26/19    PT Start Time  1528   pt arrived late thinking her appt was at 1600   PT Stop Time  1603    PT Time Calculation (min)  35 min    Activity Tolerance  Patient tolerated treatment well    Behavior During Therapy  [facility] for tasks assessed/performed       Past Medical History:  Diagnosis Date  . Arthritis   . Asthma   . Cancer ([place])    chemo for breast ca  x45mo  . Depression   . Deviated nasal septum   . Hearing loss 06/11/2014   Bilateral  . Herniated cervical disc   . Hypertension   . Migraine   . Miscarriage    2  . MS (multiple sclerosis) ([place]     Past Surgical History:  Procedure Laterality Date  . BREAST BIOPSY Right 11/21/2018   Malignant  . HEMATOMA EVACUATION Right 05/01/2019   Procedure: EVACUATION HEMATOMA;  Surgeon: WRolm Bookbinder MD;  [facility]  Service: General;  Laterality: Right;  . MASTECTOMY Right 04/30/2019  . MASTECTOMY W/ SENTINEL NODE BIOPSY Right 04/30/2019   Procedure: RIGHT MASTECTOMY WITH  RIGHT AXILLARY SENTINEL LYMPH NODE BIOPSY INJECT BLUE DYE;  Surgeon: WRolm Bookbinder MD;  [facility]  Service: General;  Laterality: Right;  . NASAL SEPTUM SURGERY    . SIMPLE MASTECTOMY WITH AXILLARY SENTINEL NODE BIOPSY Right 04/30/2019  . spinal injections      There were no vitals filed for this visit.  Subjective Assessment - 12/31/19 1530    Subjective  I've been doing the sretching at home and it seems to going okay. The range is definitely improving. I was diagnosed with shingles beginning of  last week on my buttocks. Saw doctor and was put on medicine for it.    Pertinent History  Rt mastectomy 04/30/19 with SLNB due to grade 2 IDC, high grade DCIS 0/2 lymph nodes. ER/PR positive, HER2 equivocal and then Rt breast hematoma evacuation on 05/01/19 by Dr. WDonne Hazel  Neo adjuvant chemotherapy [facility] Perjeta started 12/12/18 to current. Future treatment to include Kadcyla and anastrozole x 7 years. Other history includes MS ( currently under control so will not address this episode) , HTN, OA, asthma, bilateral hearing loss    Patient Stated Goals  to get rid of the shoulder pain    Currently in Pain?  No/denies         [facility]      AROM   Left Shoulder Flexion  147 Degrees    Left Shoulder ABduction  140 Degrees           Quick Dash - 12/31/19 0001    Open a tight or new jar  No difficulty    Do heavy household chores (wash walls, wash floors)  Mild difficulty    Carry a shopping bag or briefcase  No difficulty    Wash your back  Mild difficulty    Use a knife to cut food  No difficulty    Recreational activities in which you take some force or impact through your arm, shoulder, or hand (golf, hammering, tennis)  Mild difficulty    During the past week, to what extent has your arm, shoulder or hand problem interfered with your normal social activities with family, friends, neighbors, or groups?  Slightly    During the past week, to what extent has your arm, shoulder or hand problem limited your work or other regular daily activities  Not at all    Arm, shoulder, or hand pain.  Mild    Tingling (pins and needles) in your arm, shoulder, or hand  Mild    Difficulty Sleeping  Mild difficulty    DASH Score  15.91 %             [facility] Adult PT Treatment/Exercise - 12/31/19 0001      Manual Therapy   Manual Therapy  Joint mobilization;Soft tissue mobilization;Passive ROM    Joint Mobilization  GH glides in all directions     Soft tissue mobilization   To Lt shoulder in supine, focusing on deltoid and bicep where trigger points palpable but improved by end of session    Scapular Mobilization  --    Passive ROM  In Supine to tolerance into flexion, abduction, D2 and er                  PT Long Term Goals - 12/31/19 1606      PT LONG TERM GOAL #1   Title  Pt will report she can reach overhead to get something off a shelf with 50% less pain    Baseline  80% improvement at this time-12/31/19    Status  Achieved      PT LONG TERM GOAL #2   Title  Pt will have 140 degrees of left shoulder abduction so that she can perform her activities of daily living easier    Baseline  147 on 12/11/2019; 140 degrees - 12/31/19    Status  Achieved      PT LONG TERM GOAL #3   Title  Pt will decrease her Quick DASH score to < 20 indicating a functional improvment of left shoulder    Baseline  47.73 on 11/27/2019; 15.91 - 12/31/19    Status  Achieved            Plan - 12/31/19 1738    Clinical Impression Statement  Pt came in reporting no pain today and reports she has been feeling much better as of late. She has met all goals and is ready for D/C at this time. Shortened session today as pt thought her appt was at 1600.    Personal Factors and Comorbidities  Comorbidity 2    Comorbidities  MS, previous chemo    Examination-Activity Limitations  Reach Overhead;Lift;Hygiene/Grooming;Dressing;Bathing;Toileting    Stability/Clinical Decision Making  Stable/Uncomplicated    Rehab Potential  Good    PT Frequency  2x / week    PT Duration  4 weeks    PT Treatment/Interventions  ADLs/Self Care Home Management;Iontophoresis 36m/ml Dexamethasone;Electrical Stimulation;Moist Heat;Therapeutic activities;Therapeutic exercise;Neuromuscular re-education;Patient/family education;Manual techniques;Manual lymph drainage;Passive range of motion;Taping;Joint Manipulations    PT Next Visit Plan  D/C this visit.    Consulted and Agree with Plan of Care  Patient        Patient will benefit from skilled therapeutic intervention in order to improve the following deficits and impairments:  Decreased range of motion, Decreased strength, Increased fascial  restricitons, Impaired UE functional use, Postural dysfunction, Impaired perceived functional ability, Increased muscle spasms, Pain  Visit Diagnosis: Stiffness of left shoulder joint  Acute pain of left shoulder  Muscle weakness (generalized)  Abnormal posture     Problem List Patient Active Problem List   Diagnosis Date Noted  . Shingles 12/24/2019  . Breast cancer, right ([place]) 04/30/2019  . Port-A-Cath in place 02/13/2019  . Syncope 01/19/2019  . Nausea vomiting and diarrhea 01/19/2019  . Leukocytosis 01/19/2019  . Malignant neoplasm of upper-outer quadrant of right breast in female, estrogen receptor positive ([place]) 11/27/2018  . Neurogenic claudication due to lumbar spinal stenosis 09/19/2018  . Urinary tract infection without hematuria 08/10/2018  . Chronic left-sided thoracic back pain 07/20/2018  . Tendon nodule 04/21/2018  . Solar lentigo 04/21/2018  . Preventative health care 04/20/2018  . Acute cystitis without hematuria 03/16/2018  . Gait disturbance 01/12/2018  . Polyuria 12/08/2017  . Easy bruising 08/31/2017  . Chronic obstructive pulmonary disease ([place]) 11/30/2016  . Essential hypertension 01/26/2016  . Migraine headache 06/23/2014  . Multiple sclerosis ([place]) 06/23/2014  . Paresthesias 06/23/2014  . Restless leg syndrome 06/23/2014  . Hearing loss, bilateral 06/11/2014  . Major depression, recurrent, chronic ([place]) 06/11/2014  . Cervical herniated disc 06/11/2014  . Cigarette nicotine dependence without complication 10/71/2524  . Lumbar herniated disc 06/11/2014  . Perimenopausal symptoms 06/11/2014    Otelia Limes, PTA 12/31/2019, 5:51 PM  [place], [place], [address] Phone:  [phone]   Fax:  [phone]  Name: Sadonna Kotara MRN: [id number] Date of Birth: Mar 05, 1961  PHYSICAL THERAPY DISCHARGE SUMMARY  Visits from Start of Care: 6  Current functional level related to goals / functional outcomes: Pt has been improving    Remaining deficits: Occasional shoulder pain    Education / Equipment: Home exercise program Plan: Patient agrees to discharge.  Patient goals were partially met. Patient is being discharged due to meeting the stated rehab goals.  ?????  Maudry Diego, PT 01/03/20 5:37 PM

## 2020-01-02 ENCOUNTER — Ambulatory Visit: Payer: Medicare HMO

## 2020-01-04 ENCOUNTER — Ambulatory Visit
Admission: RE | Admit: 2020-01-04 | Discharge: 2020-01-04 | Disposition: A | Discharge status: Home / Self Care | Payer: Medicare HMO | Source: Ambulatory Visit | Attending: Hematology and Oncology | Admitting: Hematology and Oncology

## 2020-01-04 DIAGNOSIS — C50411 Malignant neoplasm of upper-outer quadrant of right female breast: Secondary | ICD-10-CM

## 2020-01-04 DIAGNOSIS — Z170 Estrogen receptor positive status [ER+]: Secondary | ICD-10-CM

## 2020-01-04 DIAGNOSIS — N6322 Unspecified lump in the left breast, upper inner quadrant: Secondary | ICD-10-CM | POA: Diagnosis not present

## 2020-01-04 IMAGING — MR MR BREAST BILAT WO/W CM
6 of 10 series · 31 of 48 positions shown · IV contrast (gadavist)
Comparison: Previous exam(s).

CLINICAL DATA: 58-year-old female status post right mastectomy
presents for six-month follow-up of probably benign left breast
findings. The patient was initially recommended for an MRI guided
biopsy of a 10 mm enhancing mass in the upper inner left breast.
However, there was no persistent finding at the time of biopsy, and
six-month follow-up was recommended instead.

LABS:  None performed on site.
EXAM:
BILATERAL BREAST MRI WITH AND WITHOUT CONTRAST
TECHNIQUE: Multiplanar, multisequence MR images of both breasts were obtained
prior to and following the intravenous administration of 5 ml of
Gadavist.

[Series 3: fl3d pre-cm no · axial · non-contrast · 1.2mm · 0.86mm/px · z∈[-103,+69]mm · 7 of 144 slices shown]
[im 1/144]
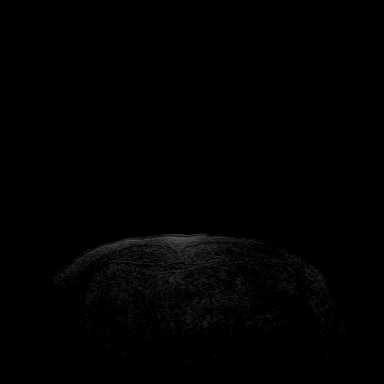
[im 24/144]
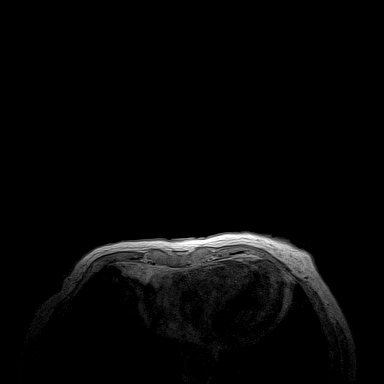
[im 48/144]
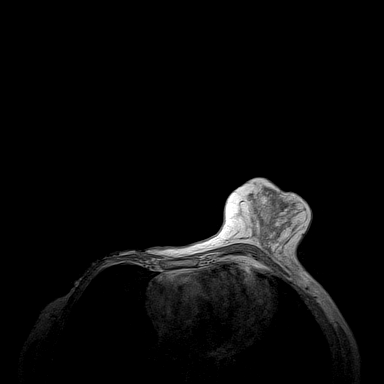
[im 72/144]
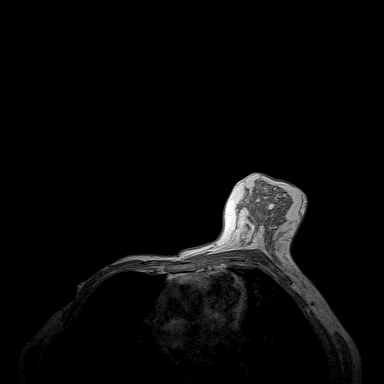
[im 96/144]
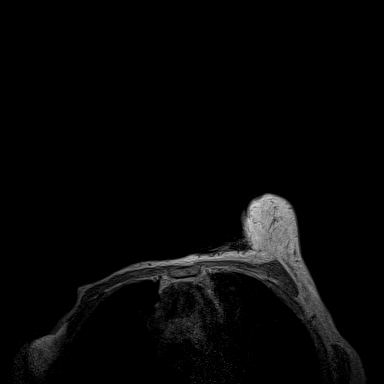
[im 120/144]
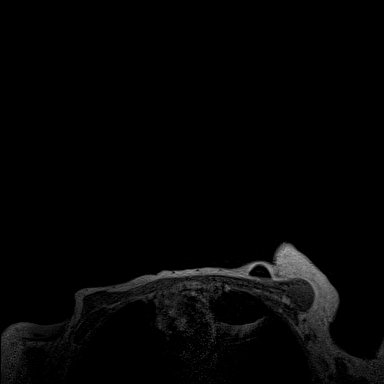
[im 144/144]
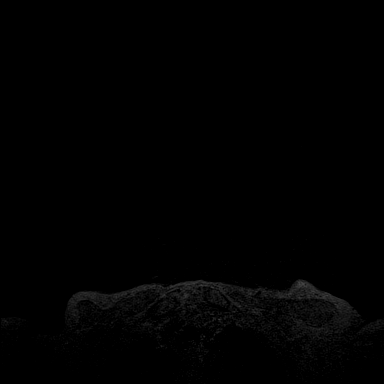

[Series 4: t2_tirm_tra ipat (a-p) · axial · 3.0mm · 0.64mm/px · z∈[-105,+72]mm · 3 of 60 slices shown]
[im 1/60]
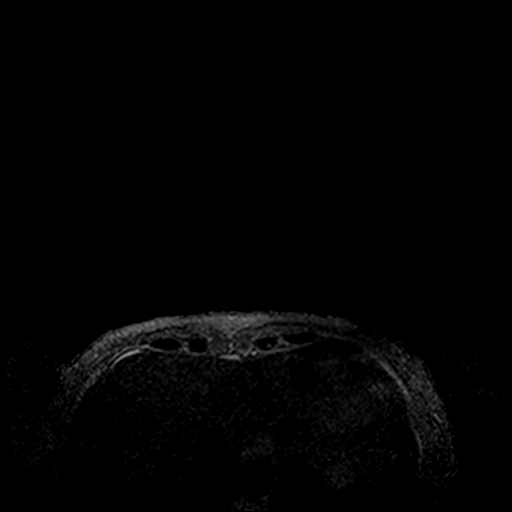
[im 30/60]
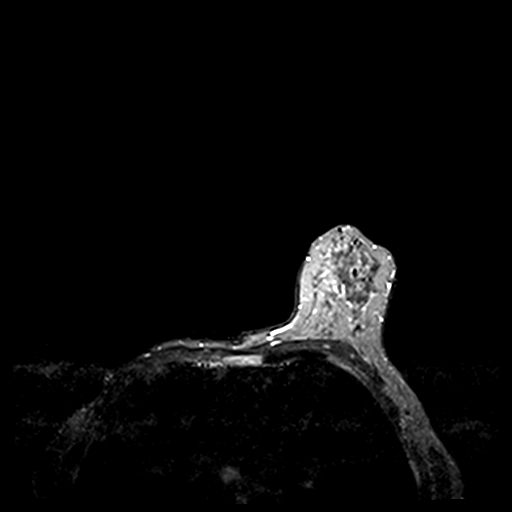
[im 60/60]
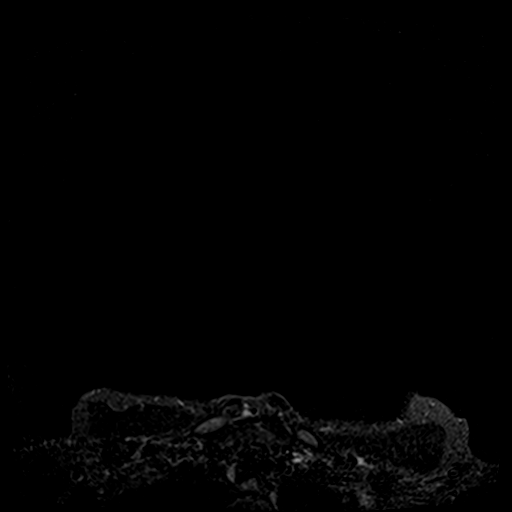

[Series 5: fl3d pre-cm · axial · non-contrast · 1.2mm · 0.86mm/px · z∈[-103,+69]mm · 7 of 144 slices shown]
[im 1/144]
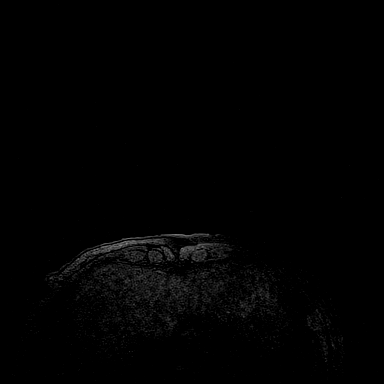
[im 24/144]
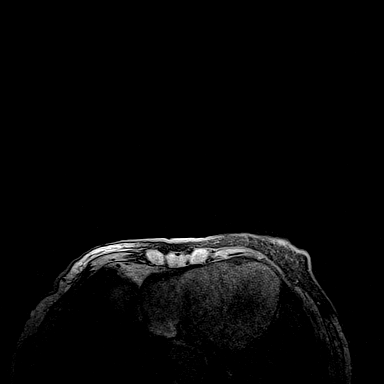
[im 48/144]
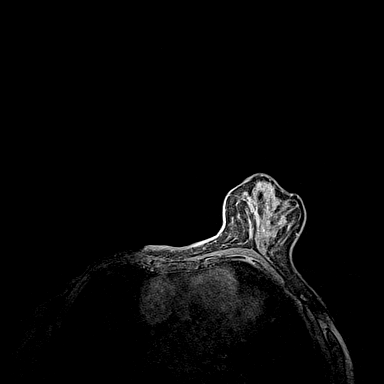
[im 72/144]
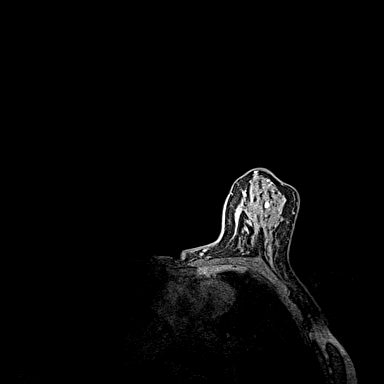
[im 96/144]
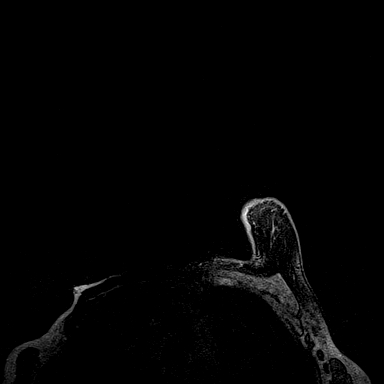
[im 120/144]
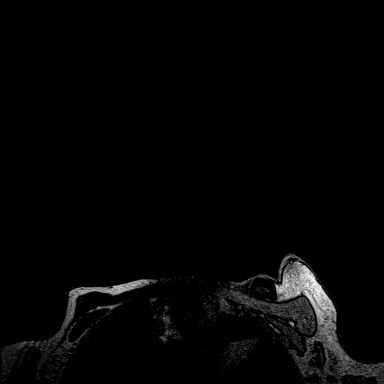
[im 144/144]
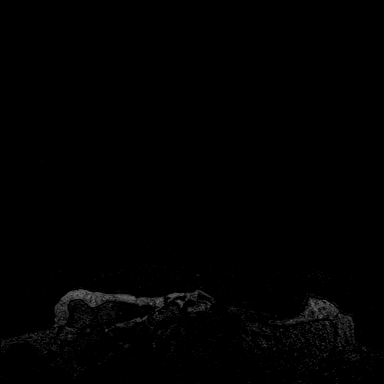

[Series 7: fl3d post-cm 20 · axial · 1.2mm · 0.86mm/px · z∈[-103,+69]mm · 7 of 144 slices shown (1 of 2)]
[im 1/144]
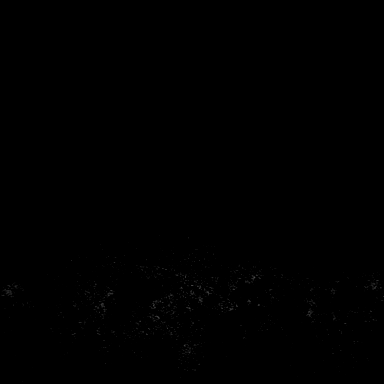
[im 24/144]
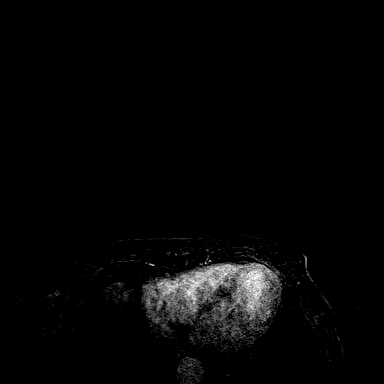
[im 48/144]
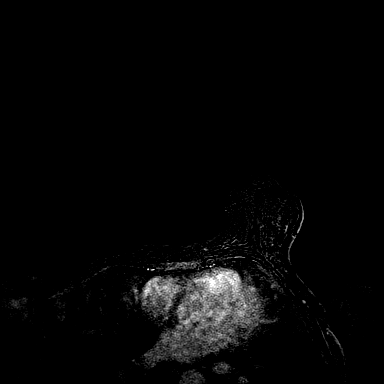
[im 72/144]
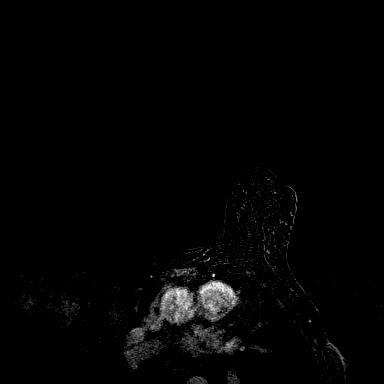
[im 96/144]
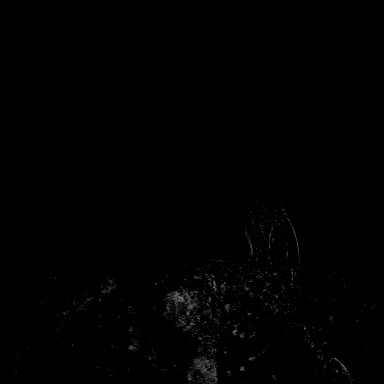
[im 120/144]
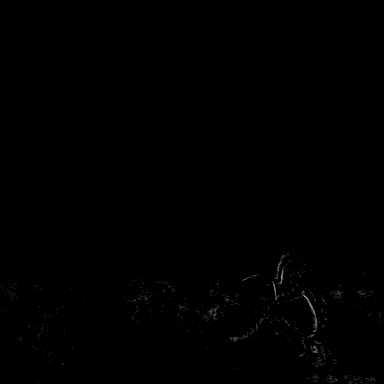
[im 144/144]
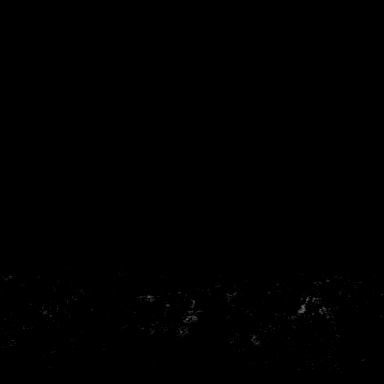

[Series 8: fl3d post-cm 20 · axial · 172.8mm · 0.86mm/px · 1 of 1 slices shown (2 of 2)]
[im 1/1]
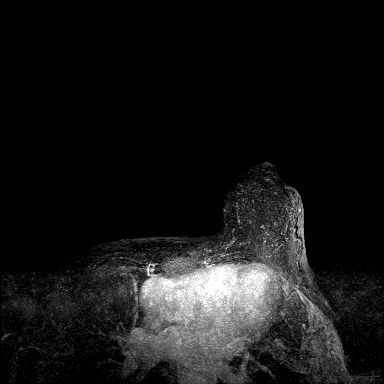

[Series 10: fl3d post-cm 3min_sub · axial · 1.2mm · 0.86mm/px · z∈[-103,+40]mm · 6 of 144 slices shown]
[im 1/144]
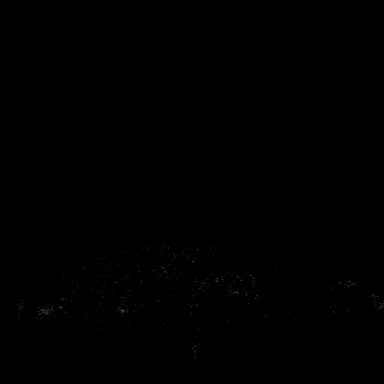
[im 24/144]
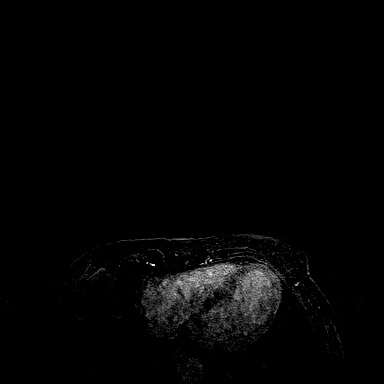
[im 48/144]
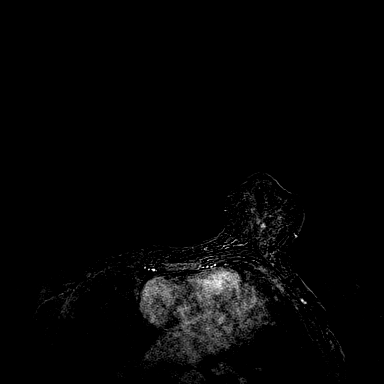
[im 72/144]
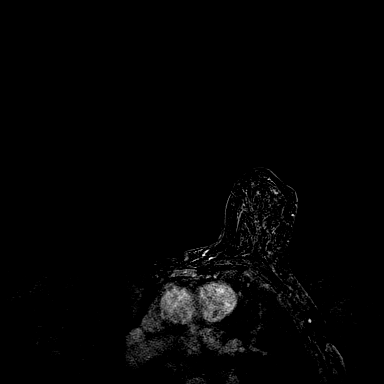
[im 96/144]
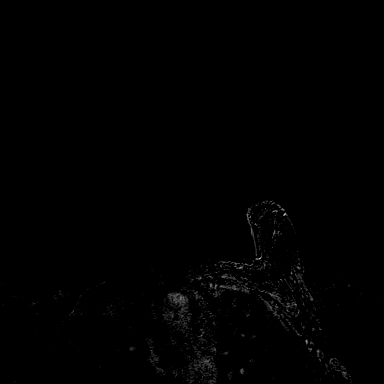
[im 120/144]
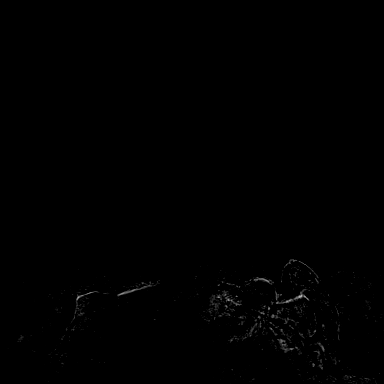

[31 of 48 positions shown; findings below may reference images not displayed]

Three-dimensional MR images were rendered by post-processing of the
original MR data on an independent workstation. The
three-dimensional MR images were interpreted, and findings are
reported in the following complete MRI report for this study. Three
dimensional images were evaluated at the independent [employer]
workstation
FINDINGS: Breast composition: d. Extreme fibroglandular tissue. The patient is
status post right mastectomy.

Background parenchymal enhancement: Moderate.

Right mastectomy bed: No suspicious enhancement.

Left breast: No dominant mass or suspicious enhancement.

Lymph nodes: No abnormal appearing lymph nodes.

Ancillary findings:  None.
IMPRESSION: 1. No MRI evidence of malignancy in the left breast.
2. Status post right mastectomy.

RECOMMENDATION:
The patient is due for an annual screening mammogram in [REDACTED]

BI-RADS CATEGORY  2: Benign.

## 2020-01-04 MED ORDER — GADOBUTROL 1 MMOL/ML IV SOLN
5.0000 mL | Freq: Once | INTRAVENOUS | Status: AC | PRN
  Administered 2020-01-04: 5 mL via INTRAVENOUS

## 2020-01-07 ENCOUNTER — Other Ambulatory Visit: Payer: Medicare HMO

## 2020-01-08 ENCOUNTER — Other Ambulatory Visit: Payer: Self-pay | Admitting: Hematology and Oncology

## 2020-01-08 MED ORDER — OXYCODONE-ACETAMINOPHEN 5-325 MG PO TABS: 1.0000 | ORAL_TABLET | Freq: Three times a day (TID) | ORAL | 0 refills | Status: DC | PRN

## 2020-01-10 NOTE — Progress Notes (Signed)
HEMATOLOGY-ONCOLOGY TELEPHONE VISIT PROGRESS NOTE  I connected with Debbie Watts on 01/11/2020 at  8:15 AM EDT by telephone and verified that I am speaking with the correct person using two identifiers.  I discussed the limitations, risks, security and privacy concerns of performing an evaluation and management service by telephone and the availability of in person appointments.  I also discussed with the patient that there may be a patient responsible charge related to this service. The patient expressed understanding and agreed to proceed.   History of Present Illness: Debbie Watts is a 59 y.o. female with above-mentioned history of right breast cancer who completed neoadjuvant chemotherapy with [place], underwent a right mastectomy, completed adjuvantKadcyla, and is currently on anti-estrogen therapy with anastrozole. Breast MRI on 01/04/20 showed no evidence of malignancy in the left breast. She presents over the phone todayto review her MRI.  She had shingles in February and tells me that it has not completely gone away.  She finished Valtrex 2 days ago.   Oncology History  Malignant neoplasm of upper-outer quadrant of right breast in female, estrogen receptor positive ([place])  11/21/2018 Initial Diagnosis   Screening detected right breast mass upper outer quadrant, in addition to areas of calcifications which were not biopsied.  By ultrasound she had 2 breast masses 12 o'clock position 2.9 cm: Grade 2 IDC with high-grade DCIS ER 100%, PR 70%, Ki-67 10%, HER-2 3+ by IHC and 11 o'clock position 1 cm, ER 90%, PR 50%, Ki-67 15%, HER-2 3+ by IHC, T2N0 stage Ib   11/29/2018 Cancer Staging   Staging form: Breast, AJCC 8th Edition - Clinical stage from 11/29/2018: Stage IB (cT2, cN0, cM0, G3, ER+, PR+, HER2+) - Signed by Nicholas Lose, MD on 11/29/2018   12/12/2018 -  Neo-Adjuvant Chemotherapy   Neoadjuvant chemotherapy with [facility] Perjeta   01/18/2019 - 01/20/2019 Hospital Admission    Syncope   04/30/2019 Surgery   Right mastectomy: Grade 2 IDC, 3.9 cm, high-grade DCIS, margins are negative, 0/2 lymph nodes negative, ER 95%, PR 80%, HER-2 2+ equivocal, T2 N0   04/30/2019 Cancer Staging   Staging form: Breast, AJCC 8th Edition - Pathologic stage from 04/30/2019: No Stage Recommended (ypT2, pN0, cM0, G2, ER+, PR+, HER2-) - Signed by Gardenia Phlegm, NP on 05/09/2019   05/24/2019 -  Chemotherapy   The patient had ado-trastuzumab emtansine (KADCYLA) 200 mg in sodium chloride 0.9 % 250 mL chemo infusion, 240 mg, Intravenous, Once, 10 of 10 cycles Dose modification: 3 mg/kg (original dose 3.6 mg/kg, Cycle 5, Reason: Dose not tolerated) Administration: 200 mg (05/24/2019), 200 mg (06/14/2019), 160 mg (08/16/2019), 160 mg (09/06/2019), 160 mg (09/27/2019), 160 mg (10/31/2019), 160 mg (07/05/2019), 160 mg (07/26/2019), 160 mg (11/21/2019), 160 mg (12/12/2019)  for chemotherapy treatment.    05/24/2019 -  Anti-estrogen oral therapy   Anastrozole 1 mg daily     Observations/Objective:  Shingles   Assessment Plan:  Malignant neoplasm of upper-outer quadrant of right breast in female, estrogen receptor positive ([place]) 11/21/2018:Screening detected right breast mass upper outer quadrant, in addition to areas of calcifications which were not biopsied. By ultrasound she had 2 breast masses 12 o'clock position 2.9 cm: Grade 2 IDC with high-grade DCIS ER 100%, PR 70%, Ki-67 10%, HER-2 3+ by IHC and 11 o'clock position 1 cm, ER 90%, PR 50%, Ki-67 15%, HER-2 3+ by IHC, T2N0 stage Ib  Neoadjuvant chemotherapy with [facility] Perjeta x6 cycles 12/12/2018-03/27/2019  04/30/2019:Right mastectomy: Grade 2 IDC, 3.9 cm, high-grade DCIS,  margins are negative, 0/2 lymph nodes negative, ER 95%, PR 80%, HER-2 2+ equivocal, T2 N0  Treatment plan: 1.Adjuvant Kadcylastarted 05/24/2019- 12/12/19 2.Adjuvant antiestrogen therapy with anastrozole 1 mg p.o. daily x7 yearsstarted  05/24/2019 ----------------------------------------------------------------------------------------------------------------------------- Low back pain with pain radiating down the leg:On Percocets 09/24/2019: MRI lumbar spine: Negative for metastatic disease. Mild degenerative changes withoutdisc protrusion or stenosis 09/24/2019: Bone scan: Nonspecific uptake right 11th rib  Anastrozole toxicities: Denies any hot flashes or myalgias. Shingles: 12/21/19 Completed Valtrex, still having zoster rash.  I recommended 1 more week of Valtrex.  I sent a new prescription today. Mammogram 11/23/2019: Benign,  left breast MRI  01/10/2020: No MRI evidence of malignancy in the left breast, status post right mastectomy.   Given the negative results in the MRI, she can have the port removed.  I sent a message to Dr. Donne Hazel to remove the port. I will see her back in August for her regular follow-up.  I discussed the assessment and treatment plan with the patient. The patient was provided an opportunity to ask questions and all were answered. The patient agreed with the plan and demonstrated an understanding of the instructions. The patient was advised to call back or seek an in-person evaluation if the symptoms worsen or if the condition fails to improve as anticipated.   I provided 12 minutes of non-face-to-face time during this encounter.   Rulon Eisenmenger, MD 01/11/2020    I, Molly Dorshimer, am acting as scribe for Nicholas Lose, MD.  I have reviewed the above documentation for accuracy and completeness, and I agree with the above.

## 2020-01-11 ENCOUNTER — Inpatient Hospital Stay (HOSPITAL_BASED_OUTPATIENT_CLINIC_OR_DEPARTMENT_OTHER): Payer: Medicare HMO | Admitting: Hematology and Oncology

## 2020-01-11 DIAGNOSIS — C50411 Malignant neoplasm of upper-outer quadrant of right female breast: Secondary | ICD-10-CM

## 2020-01-11 DIAGNOSIS — Z170 Estrogen receptor positive status [ER+]: Secondary | ICD-10-CM

## 2020-01-11 MED ORDER — VALACYCLOVIR HCL 1 G PO TABS: 1000.0000 mg | ORAL_TABLET | Freq: Two times a day (BID) | ORAL | 0 refills | Status: DC

## 2020-01-11 NOTE — Assessment & Plan Note (Signed)
11/21/2018:Screening detected right breast mass upper outer quadrant, in addition to areas of calcifications which were not biopsied. By ultrasound she had 2 breast masses 12 o'clock position 2.9 cm: Grade 2 IDC with high-grade DCIS ER 100%, PR 70%, Ki-67 10%, HER-2 3+ by IHC and 11 o'clock position 1 cm, ER 90%, PR 50%, Ki-67 15%, HER-2 3+ by IHC, T2N0 stage Ib  Neoadjuvant chemotherapy with [facility] Perjeta x6 cycles 12/12/2018-03/27/2019  04/30/2019:Right mastectomy: Grade 2 IDC, 3.9 cm, high-grade DCIS, margins are negative, 0/2 lymph nodes negative, ER 95%, PR 80%, HER-2 2+ equivocal, T2 N0  Treatment plan: 1.Adjuvant Kadcylastarted 05/24/2019- 12/12/19 2.Adjuvant antiestrogen therapy with anastrozole 1 mg p.o. daily x7 yearsstarted 05/24/2019 ----------------------------------------------------------------------------------------------------------------------------- Low back pain with pain radiating down the leg:On Percocets 09/24/2019: MRI lumbar spine: Negative for metastatic disease. Mild degenerative changes withoutdisc protrusion or stenosis 09/24/2019: Bone scan: Nonspecific uptake right 11th rib  Anastrozole toxicities: Denies any hot flashes or myalgias. Shingles: 12/21/19 On Valtrex Mammogram 11/23/2019: Benign,  left breast MRI  01/10/2020: No MRI evidence of malignancy in the left breast, status post right mastectomy.   Given the negative results in the MRI, she can have the port removed.

## 2020-01-15 ENCOUNTER — Telehealth: Payer: Self-pay

## 2020-01-15 MED ORDER — FLUCONAZOLE 100 MG PO TABS: 100.0000 mg | ORAL_TABLET | Freq: Every day | ORAL | 0 refills | Status: DC

## 2020-01-15 NOTE — Telephone Encounter (Signed)
Pt called to report, "I have thrush again."  Pt with white film present in mouth X 4 days.  Pt able to tolerate fluids well, denies any fever or pain to mouth.    MD recommendations for Diflucan 100mg  1 tablet daily, until symptoms resolve #30 tablets with 0 Refills.    Pt notified and voiced understanding, no further needs at this time.

## 2020-01-19 ENCOUNTER — Other Ambulatory Visit: Payer: Self-pay | Admitting: Neurology

## 2020-01-28 DIAGNOSIS — Z452 Encounter for adjustment and management of vascular access device: Secondary | ICD-10-CM | POA: Diagnosis not present

## 2020-01-28 DIAGNOSIS — Z853 Personal history of malignant neoplasm of breast: Secondary | ICD-10-CM | POA: Diagnosis not present

## 2020-01-30 ENCOUNTER — Telehealth: Payer: Self-pay | Admitting: Neurology

## 2020-01-30 NOTE — Telephone Encounter (Signed)
Using dog as a therapy companion for trips and stays in hotels

## 2020-01-30 NOTE — Telephone Encounter (Signed)
Pt is asking if Dr Felecia Shelling will prepare a letter for her to be able to take her dog along with her for trips exa: grocery store, hotel stays and things of that nature.  Please call to discuss.

## 2020-01-31 ENCOUNTER — Encounter: Payer: Self-pay | Admitting: Neurology

## 2020-01-31 NOTE — Telephone Encounter (Signed)
Called pt to let her know letter was written and I will place up front for her to pick up next week. Advised our office is closed tomorrow.

## 2020-01-31 NOTE — Telephone Encounter (Signed)
Phone rep checked office voicemail's at 12:35p.m. pt called back stating she has not heard anything in response to her message on yesterday.  Pt was reminded to allow 24-48 hours for a response to her message.  Pt was told the RN routed her request to Dr Felecia Shelling and that she will be contacted once Dr Felecia Shelling has reviewed her request.

## 2020-01-31 NOTE — Progress Notes (Signed)
    01/31/20   Re:  Arlynn Puglisi   [birth year]   To whom it may concern:  Tyleen Reckers is followed at the [place] [facility] at [facility] for multiple sclerosis.  She received benefit from her therapy dog when she travels.  Please allow her to take the dog with her on trips and hotels.  Sincerely   Kevante Lunt A. Felecia Shelling, MD, PhD, FAAN Certified in Neurology, Clinical Neurophysiology, Sleep Medicine, Pain Medicine and Neuroimaging Director, [place] at [facility] [address] [place], [place] [address] [phone]

## 2020-02-12 ENCOUNTER — Ambulatory Visit (INDEPENDENT_AMBULATORY_CARE_PROVIDER_SITE_OTHER): Payer: Medicare HMO | Admitting: Internal Medicine

## 2020-02-12 ENCOUNTER — Encounter: Payer: Self-pay | Admitting: Internal Medicine

## 2020-02-12 ENCOUNTER — Other Ambulatory Visit: Payer: Self-pay

## 2020-02-12 DIAGNOSIS — M25512 Pain in left shoulder: Secondary | ICD-10-CM

## 2020-02-12 DIAGNOSIS — G8929 Other chronic pain: Secondary | ICD-10-CM | POA: Diagnosis not present

## 2020-02-12 MED ORDER — MELOXICAM 15 MG PO TBDP: 15.0000 mg | ORAL_TABLET | Freq: Every day | ORAL | 0 refills | Status: DC

## 2020-02-12 NOTE — Patient Instructions (Signed)
Ms. Geisel, It was nice meeting you! Congratulations on being cancer free!  We discussed your shoulder pain. Since it has not improved with a pretty good trial of physical therapy, I think it is reasonable to have you evaluated by orthopedic surgery.  In the mean time, continue caring for it like you have been. I'm also going to prescribe a prescription strength anti-inflammatory to take once daily.   Take care, Dr. Koleen Distance

## 2020-02-14 ENCOUNTER — Telehealth: Payer: Self-pay | Admitting: Neurology

## 2020-02-14 ENCOUNTER — Encounter: Payer: Self-pay | Admitting: Internal Medicine

## 2020-02-14 DIAGNOSIS — G8929 Other chronic pain: Secondary | ICD-10-CM | POA: Insufficient documentation

## 2020-02-14 DIAGNOSIS — M7502 Adhesive capsulitis of left shoulder: Secondary | ICD-10-CM | POA: Insufficient documentation

## 2020-02-14 NOTE — Assessment & Plan Note (Signed)
Patient presents for 3 month history of left shoulder pain. No trauma or injury at onset. Pain is worse when she sleeps on her left side or trying to lift above her head. She has Tramadol and Oxycodone prescribed to her by her oncologist which help her sleep better, but has not improved the pain. She completed physical therapy for several weeks which she states was minimally beneficial.  Exam consistent with shoulder impingement vs rotator cuff tendinopathy. Offered steroid injection for therapeutic and diagnostic benefit, but she preferred to be referred to orthopedic surgery first.   Plan - referral placed to ortho - sent in Meloxicam to treat inflammation

## 2020-02-14 NOTE — Telephone Encounter (Signed)
Debbie Watts: [id number] (exp. 02/14/20 to 03/15/20) patient is scheduled at GI for 02/19/20.

## 2020-02-14 NOTE — Progress Notes (Signed)
Acute Office Visit  Subjective:    Patient ID: Debbie Watts, female    DOB: 04/08/1961, 59 y.o.   MRN: [id number]  Chief Complaint  Patient presents with  . Shoulder Pain    left     HPI Patient is in today for left shoulder pain. Please see problem based charting for further details.   Past Medical History:  Diagnosis Date  . Arthritis   . Asthma   . Cancer ([place])    chemo for breast ca  x76mos  . Depression   . Deviated nasal septum   . Hearing loss 06/11/2014   Bilateral  . Herniated cervical disc   . Hypertension   . Migraine   . Miscarriage    2  . MS (multiple sclerosis) ([place])     Past Surgical History:  Procedure Laterality Date  . BREAST BIOPSY Right 11/21/2018   Malignant  . HEMATOMA EVACUATION Right 05/01/2019   Procedure: EVACUATION HEMATOMA;  Surgeon: Rolm Bookbinder, MD;  Location: [place];  Service: General;  Laterality: Right;  . MASTECTOMY Right 04/30/2019  . MASTECTOMY W/ SENTINEL NODE BIOPSY Right 04/30/2019   Procedure: RIGHT MASTECTOMY WITH  RIGHT AXILLARY SENTINEL LYMPH NODE BIOPSY INJECT BLUE DYE;  Surgeon: Rolm Bookbinder, MD;  Location: [facility];  Service: General;  Laterality: Right;  . NASAL SEPTUM SURGERY    . SIMPLE MASTECTOMY WITH AXILLARY SENTINEL NODE BIOPSY Right 04/30/2019  . spinal injections      Family History  Adopted: Yes  Problem Relation Age of Onset  . Cancer Maternal Uncle        throat    Social History   Socioeconomic History  . Marital status: Single    Spouse name: Not on file  . Number of children: 2  . Years of education: 66  . Highest education level: Not on file  Occupational History  . Occupation: Disabled  Tobacco Use  . Smoking status: Current Every Day Smoker    Packs/day: 1.00    Years: 44.00    Pack years: 44.00    Types: Cigarettes  . Smokeless tobacco: Never Used  . Tobacco comment: 1 pk per day  Substance and Sexual Activity  . Alcohol use: Yes    Comment: 0-5 beers daily  . Drug  use: Yes    Types: Marijuana  . Sexual activity: Not Currently    Partners: Male  Other Topics Concern  . Not on file  Social History Narrative   Patient is single with 2 children.   Patient is right handed.   Current Social History 08/10/2018        Right handed       Patient lives with significant other [name]) in one level Townhome 08/10/2018    Transportation: Patient has own vehicle  08/10/2018   Important Relationships [name] 08/10/2018    Pets: one dog named Browser 08/10/2018   Education / Work:  12th grade/ Disabled 08/10/2018   Interests / Fun: Watching baseball and Nascar 08/10/2018   Current Stressors: Significant other is depressed over his poor health 08/10/2018   Religious / Personal Beliefs: "I believe in God" 08/10/2018   Other: "I had a miscarriage before I had my 2 sons." 08/10/2018   L. Silvano Rusk, RN, BSN  Social Determinants of Health   Financial Resource Strain:   . Difficulty of Paying Living Expenses:   Food Insecurity:   . Worried About Charity fundraiser in the Last Year:   . Arboriculturist in the Last Year:   Transportation Needs:   . Film/video editor (Medical):   Marland Kitchen Lack of Transportation (Non-Medical):   Physical Activity:   . Days of Exercise per Week:   . Minutes of Exercise per Session:   Stress:   . Feeling of Stress :   Social Connections:   . Frequency of Communication with Friends and Family:   . Frequency of Social Gatherings with Friends and Family:   . Attends Religious Services:   . Active Member of Clubs or Organizations:   . Attends Archivist Meetings:   Marland Kitchen Marital Status:   Intimate Partner Violence:   . Fear of Current or Ex-Partner:   . Emotionally Abused:   Marland Kitchen Physically Abused:   . Sexually Abused:     Outpatient Medications Prior to Visit  Medication Sig Dispense Refill  . albuterol (VENTOLIN  HFA) 108 (90 Base) MCG/ACT inhaler Inhale 2 puffs into the lungs every 6 (six) hours as needed for wheezing or shortness of breath.    . anastrozole (ARIMIDEX) 1 MG tablet Take 1 tablet (1 mg total) by mouth daily. 90 tablet 3  . aspirin-acetaminophen-caffeine (EXCEDRIN MIGRAINE) 250-250-65 MG tablet Take 2 tablets by mouth every 6 (six) hours as needed for migraine.    . betamethasone valerate lotion (VALISONE) 0.1 % Apply 1 application topically daily as needed for irritation.     . carbidopa-levodopa (SINEMET IR) 10-100 MG tablet Take 1 tablet by mouth at bedtime. 90 tablet 3  . diphenhydrAMINE (BENADRYL) 25 MG tablet Take 25 mg by mouth at bedtime as needed.     . fluconazole (DIFLUCAN) 100 MG tablet Take 1 tablet (100 mg total) by mouth daily. 30 tablet 0  . fluticasone (FLONASE) 50 MCG/ACT nasal spray Place 2 sprays into both nostrils at bedtime.    . gabapentin (NEURONTIN) 300 MG capsule Take 2 capsules (600 mg total) by mouth 2 (two) times daily. 360 capsule 3  . lisinopril (ZESTRIL) 5 MG tablet TAKE 1 TABLET BY MOUTH EVERY DAY 90 tablet 1  . methocarbamol (ROBAXIN) 500 MG tablet Take 1 tablet (500 mg total) by mouth every 8 (eight) hours as needed for muscle spasms. 20 tablet 1  . Multiple Vitamin (MULTIVITAMIN WITH MINERALS) TABS tablet Take 2 tablets by mouth daily.    Marland Kitchen omeprazole (PRILOSEC OTC) 20 MG tablet Take 20 mg by mouth daily as needed (acid reflux).    Marland Kitchen oxyCODONE-acetaminophen (PERCOCET/ROXICET) 5-325 MG tablet Take 1 tablet by mouth every 8 (eight) hours as needed for severe pain. 60 tablet 0  . PARoxetine (PAXIL) 20 MG tablet TAKE 1 TABLET BY MOUTH EVERY DAY 90 tablet 1  . TECFIDERA 240 MG CPDR Take 1 capsule by mouth twice daily 180 capsule 3  . traMADol (ULTRAM) 50 MG tablet TAKE 1 TABLET BY MOUTH 2 TIMES DAILY AS NEEDED. 60 tablet 5  . valACYclovir (VALTREX) 1000 MG tablet Take 1 tablet (1,000 mg total) by mouth 2 (two) times daily. 14 tablet 0  . diclofenac (VOLTAREN) 75  MG EC tablet TAKE 1 TABLET BY MOUTH TWICE A DAY 60 tablet 5   No facility-administered medications prior to visit.    Allergies  Allergen Reactions  . Procaine Shortness Of Breath  Review of Systems  Constitutional: Negative for chills and fever.  Musculoskeletal: Negative for joint swelling, neck pain and neck stiffness.  Skin: Negative for rash.  Neurological: Negative for weakness and numbness.       Objective:    Physical Exam Constitutional:      General: She is not in acute distress.    Appearance: Normal appearance.  Musculoskeletal:     Left shoulder: Tenderness present. No swelling or deformity. Decreased range of motion. Normal strength. Normal pulse.     Comments: Active ROM limited to 90 degrees in abduction, 140 degrees with flexion. Pain with adduction. Negative empty can test. Strength and sensation intact.   Neurological:     Mental Status: She is alert.  Psychiatric:        Mood and Affect: Mood normal.        Behavior: Behavior normal.     BP 128/89 (BP Location: Left Arm, Patient Position: Sitting, Cuff Size: Small)   Pulse 100   Temp 98 F (36.7 C) (Oral)   Ht 5\' 2"  (1.575 m)   Wt 116 lb 9.6 oz (52.9 kg)   SpO2 100% Comment: room air  BMI 21.33 kg/m  Wt Readings from Last 3 Encounters:  02/12/20 116 lb 9.6 oz (52.9 kg)  12/24/19 121 lb 4.8 oz (55 kg)  12/20/19 119 lb (54 kg)    Health Maintenance Due  Topic Date Due  . COVID-19 Vaccine (1) Never done  . COLON CANCER SCREENING ANNUAL FOBT  Never done  . COLONOSCOPY  Never done  . PAP SMEAR-Modifier  07/27/2019    There are no preventive care reminders to display for this patient.   No results found for: TSH Lab Results  Component Value Date   WBC 4.0 12/12/2019   HGB 13.9 12/12/2019   HCT 41.3 12/12/2019   MCV 93.2 12/12/2019   PLT 157 12/12/2019   Lab Results  Component Value Date   NA 141 12/12/2019   K 3.7 12/12/2019   CO2 30 12/12/2019   GLUCOSE 93 12/12/2019   BUN  13 12/12/2019   CREATININE 0.68 12/12/2019   BILITOT 0.4 12/12/2019   ALKPHOS 85 12/12/2019   AST 35 12/12/2019   ALT 44 12/12/2019   PROT 7.3 12/12/2019   ALBUMIN 4.2 12/12/2019   CALCIUM 8.9 12/12/2019   ANIONGAP 8 12/12/2019   No results found for: CHOL No results found for: HDL No results found for: LDLCALC No results found for: TRIG No results found for: CHOLHDL Lab Results  Component Value Date   HGBA1C 5.3 12/07/2017       Assessment & Plan:   Problem List Items Addressed This Visit      Other   Chronic left shoulder pain    Patient presents for 3 month history of left shoulder pain. No trauma or injury at onset. Pain is worse when she sleeps on her left side or trying to lift above her head. She has Tramadol and Oxycodone prescribed to her by her oncologist which help her sleep better, but has not improved the pain. She completed physical therapy for several weeks which she states was minimally beneficial.  Exam consistent with shoulder impingement vs rotator cuff tendinopathy. Offered steroid injection for therapeutic and diagnostic benefit, but she preferred to be referred to orthopedic surgery first.   Plan - referral placed to ortho - sent in Meloxicam to treat inflammation       Relevant Medications   Meloxicam 15 MG TBDP  Other Relevant Orders   Ambulatory referral to Orthopedic Surgery       Meds ordered this encounter  Medications  . Meloxicam 15 MG TBDP    Sig: Take 15 mg by mouth daily.    Dispense:  30 tablet    Refill:  0     Lenzy Kerschner D Kynzlee Hucker, DO

## 2020-02-17 NOTE — Progress Notes (Signed)
Internal Medicine Clinic Attending  Case discussed with Dr. Bloomfield at the time of the visit.  We reviewed the resident's history and exam and pertinent patient test results.  I agree with the assessment, diagnosis, and plan of care documented in the resident's note.  

## 2020-02-19 ENCOUNTER — Ambulatory Visit
Admission: RE | Admit: 2020-02-19 | Discharge: 2020-02-19 | Disposition: A | Discharge status: Home / Self Care | Payer: Medicare HMO | Source: Ambulatory Visit | Attending: Neurology | Admitting: Neurology

## 2020-02-19 ENCOUNTER — Other Ambulatory Visit: Payer: Self-pay

## 2020-02-19 DIAGNOSIS — G35 Multiple sclerosis: Secondary | ICD-10-CM | POA: Diagnosis not present

## 2020-02-19 MED ORDER — GADOBENATE DIMEGLUMINE 529 MG/ML IV SOLN
10.0000 mL | Freq: Once | INTRAVENOUS | Status: AC | PRN
  Administered 2020-02-19: 16:00:00 10 mL via INTRAVENOUS

## 2020-02-20 ENCOUNTER — Ambulatory Visit: Payer: Medicare HMO | Admitting: Orthopaedic Surgery

## 2020-02-20 ENCOUNTER — Ambulatory Visit (INDEPENDENT_AMBULATORY_CARE_PROVIDER_SITE_OTHER): Payer: Medicare HMO

## 2020-02-20 ENCOUNTER — Encounter: Payer: Self-pay | Admitting: Orthopaedic Surgery

## 2020-02-20 VITALS — Ht 62.0 in | Wt 116.0 lb

## 2020-02-20 DIAGNOSIS — G8929 Other chronic pain: Secondary | ICD-10-CM

## 2020-02-20 DIAGNOSIS — M25512 Pain in left shoulder: Secondary | ICD-10-CM

## 2020-02-20 MED ORDER — DICLOFENAC SODIUM 75 MG PO TBEC: 75.0000 mg | DELAYED_RELEASE_TABLET | Freq: Two times a day (BID) | ORAL | 2 refills | Status: DC

## 2020-02-20 NOTE — Progress Notes (Signed)
Office Visit Note   Patient: Debbie Watts           Date of Birth: 05-31-1961           MRN: [id number] Visit Date: 02/20/2020              Requested by: Lucious Groves, DO [address]  [place],  [place] [address] PCP: Neva Seat, MD   Assessment & Plan: Visit Diagnoses:  1. Chronic left shoulder pain     Plan: Impression is left shoulder adhesive capsulitis.  X-rays are unremarkable.  I have recommended intra-articular steroid injection with outpatient physical therapy.  Cervical spine MRI shows moderate spinal stenosis C5-6 without significant nerve root compression.  Prescription for diclofenac sent in today.  Recheck in 6 weeks.  Follow-Up Instructions: Return in about 6 weeks (around 04/02/2020).   Orders:  Orders Placed This Encounter  Procedures  . XR Shoulder Left  . Ambulatory referral to Physical Therapy   Meds ordered this encounter  Medications  . diclofenac (VOLTAREN) 75 MG EC tablet    Sig: Take 1 tablet (75 mg total) by mouth 2 (two) times daily.    Dispense:  30 tablet    Refill:  2      Procedures: No procedures performed   Clinical Data: No additional findings.   Subjective: Chief Complaint  Patient presents with  . Left Shoulder - Pain    [name] is a 59 year old female with multiple sclerosis and chronic pain comes in for evaluation of left shoulder pain for a couple months.  Denies any injuries.  She is a referral from neurologist.  She had cervical spine MRI yesterday.  She endorses pain in her left shoulder that will radiate in the upper arm and sometimes into the left hand.  She has aching pain at rest and with activity.  She has a she takes oxycodone and tramadol at baseline.   Review of Systems  Constitutional: Negative.   HENT: Negative.   Eyes: Negative.   Respiratory: Negative.   Cardiovascular: Negative.   Endocrine: Negative.   Musculoskeletal: Negative.   Neurological: Negative.   Hematological: Negative.     Psychiatric/Behavioral: Negative.   All other systems reviewed and are negative.    Objective: Vital Signs: Ht 5\' 2"  (1.575 m)   Wt 116 lb (52.6 kg)   BMI 21.22 kg/m   Physical Exam Vitals and nursing note reviewed.  Constitutional:      Appearance: She is well-developed.  HENT:     Head: Normocephalic and atraumatic.  Pulmonary:     Effort: Pulmonary effort is normal.  Abdominal:     Palpations: Abdomen is soft.  Musculoskeletal:     Cervical back: Neck supple.  Skin:    General: Skin is warm.     Capillary Refill: Capillary refill takes less than 2 seconds.  Neurological:     Mental Status: She is alert and oriented to person, place, and time.  Psychiatric:        Behavior: Behavior normal.        Thought Content: Thought content normal.        Judgment: Judgment normal.     Ortho Exam Left shoulder reveals moderate restriction in forward flexion, external rotation, internal rotation.  Normal range of motion of the right shoulder.  Rotator cuff is grossly normal to manual muscle testing. Specialty Comments:  No specialty comments available.  Imaging: MR BRAIN W WO CONTRAST  Result Date: 02/19/2020  [facility] [address], [place], [place] [address] [phone] NEUROIMAGING REPORT STUDY DATE: 02/19/2020 PATIENT NAME: Debbie Watts DOB: 1961/02/05 MRN: [id number] EXAM: MRI Brain with and without contrast ORDERING CLINICIAN: Richard A. Sater, MD. PhD CLINICAL HISTORY: 59 year old woman with multiple sclerosis COMPARISON FILMS: MRI 08/09/2017 TECHNIQUE:MRI of the brain with and without contrast was obtained utilizing 5 mm axial slices with T1, T2, T2 flair, SWI and diffusion weighted views.  T1 sagittal, T2 coronal and postcontrast views in the axial and coronal plane were obtained. CONTRAST: 10 ml Multihance IMAGING SITE: [facility], [place]. FINDINGS: On sagittal images, the spinal cord is imaged caudally to C3 and is  normal in caliber.   The contents of the posterior fossa are of normal size and position.   The pituitary gland and optic chiasm appear normal.    Brain volume appears normal.   The ventricles are normal in size and without distortion.  There are no abnormal extra-axial collections of fluid.  There are multiple T2/FLAIR hyperintense foci in the periventricular, juxtacortical and deep white matter of both hemispheres.  None of the foci appear to be acute and they do not enhance.  Some of the periventricular foci are radially oriented to the ventricles.  Some foci are hypointense on T1-weighted images.  Compared to the MRI dated 08/09/2017, there are no new lesions.  The cerebellum and brainstem appears normal.   The deep gray matter appears normal.     Diffusion weighted images are normal.  Susceptibility weighted images are normal.   The orbits appear normal.   The VIIth/VIIIth nerve complex appears normal.  There are mild mastoid effusions.   The paranasal sinuses appear normal.  Flow voids are identified within the major intracerebral arteries.  After the infusion of contrast material, a normal enhancement pattern is noted.   This MRI of the brain with and without contrast shows the following: 1.   Multiple T2/FLAIR hyperintense foci in the hemispheres in a pattern and configuration consistent with chronic demyelinating plaque associated with multiple sclerosis.  None of the foci appear to be acute.  They do not enhance.  Compared to the MRI dated 08/09/2017, there are no new lesions. 2.   Minimal mastoid effusions of doubtful significance.  These could be due to eustachian tube dysfunction. 3.   Normal enhancement pattern and no acute findings. INTERPRETING PHYSICIAN: Richard A. Felecia Shelling, MD, PhD, FAAN Certified in  Neuroimaging by [place] [employer]   MR [place]  Result Date: 02/19/2020  [facility] [address], [place], [place] [address] [phone] NEUROIMAGING REPORT STUDY DATE: 02/19/2020 PATIENT NAME: Debbie Watts DOB: 11-10-1960 MRN: [id number] EXAM: MRI of the cervical spine with and without contrast ORDERING CLINICIAN: Richard A. Sater, MD. PhD CLINICAL HISTORY: 59 year old woman with multiple sclerosis COMPARISON FILMS: MRI 07/02/2014 TECHNIQUE: MRI of the cervical spine was obtained utilizing 3 mm sagittal slices from the posterior fossa down to the T3-4 level with T1, T2 and inversion recovery views. In addition 4 mm axial slices from [id number] down to T1-2 level were included with T2 and gradient echo views.  After the infusion contrast, additional T1-weighted images were performed. CONTRAST: 10 mL MultiHance IMAGING SITE: [facility], [place], [place], [place] FINDINGS: :  On sagittal images, the spine is imaged from above the cervicomedullary junction to T2.   The spinal cord is of normal caliber.  There are T2 hyperintense foci centrally  to the right adjacent to C2, posterolaterally slightly to the right adjacent to C4-C5 and centrally to the right adjacent to C6-C7.   All of these foci were also present on the 2015 MRI.  The vertebral bodies are normally aligned.   The vertebral bodies have normal signal.  The discs and interspaces were further evaluated on axial views from C2 to T1 as follows: C2-C3: There is minimal disc bulging.  No foraminal narrowing, spinal stenosis or nerve root compression. C3-C4: The disc and interspace appear normal. C4-C5:  There is mild spinal stenosis due to disc protrusion, uncovertebral spurring and ligamenta flava hypertrophy.  There is no foraminal narrowing or nerve root compression. C5-C6: There is moderate spinal stenosis due to disc protrusion, uncovertebral spurring and ligamenta flava hypertrophy.  There is mild left and mild to moderate right foraminal narrowing but no definite nerve root compression.  Degenerative changes have mildly progressed compared to the 2015 MRI. C6-C7: There is  mild spinal stenosis due to disc bulging, more to the right, right greater than left uncovertebral spurring and ligamenta flava hypertrophy.  There is mild right foraminal narrowing but no nerve root compression. C7-T1: The disc and interspace appear normal. After the infusion contrast, a normal enhancement pattern was observed.   This MRI of the cervical spine with and without contrast shows the following: 1.   Three T2 hyperintense foci within the spinal cord adjacent to C2, C4-C5 and C6-C7.  All of these were present on the 2015 MRI.  They do not enhance.  They are consistent with chronic demyelinating plaque associated with multiple sclerosis. 2.   Moderate spinal stenosis at C5-C6 and mild spinal stenosis at C4-C5 and C6-C7.  There does not appear to be any nerve root compression at these levels.  Degenerative changes at C5-C6 have mildly progressed compared to the 2015 MRI. 3.   There is a normal enhancement pattern. INTERPRETING PHYSICIAN: Richard A. Felecia Shelling, MD, PhD, FAAN Certified in  Neuroimaging by [place] [employer]   XR Shoulder Left  Result Date: 02/20/2020 No acute or structural abnormalities    PMFS History: Patient Active Problem List   Diagnosis Date Noted  . Chronic left shoulder pain 02/14/2020  . Shingles 12/24/2019  . Breast cancer, right ([place]) 04/30/2019  . Port-A-Cath in place 02/13/2019  . Syncope 01/19/2019  . Nausea vomiting and diarrhea 01/19/2019  . Leukocytosis 01/19/2019  . Malignant neoplasm of upper-outer quadrant of right breast in female, estrogen receptor positive ([place]) 11/27/2018  . Neurogenic claudication due to lumbar spinal stenosis 09/19/2018  . Urinary tract infection without hematuria 08/10/2018  . Chronic left-sided thoracic back pain 07/20/2018  . Tendon nodule 04/21/2018  . Solar lentigo 04/21/2018  . Preventative health care 04/20/2018  . Acute cystitis without hematuria 03/16/2018  . Gait disturbance 01/12/2018  . Polyuria  12/08/2017  . Easy bruising 08/31/2017  . Chronic obstructive pulmonary disease ([place]) 11/30/2016  . Essential hypertension 01/26/2016  . Migraine headache 06/23/2014  . Multiple sclerosis ([place]) 06/23/2014  . Paresthesias 06/23/2014  . Restless leg syndrome 06/23/2014  . Hearing loss, bilateral 06/11/2014  . Major depression, recurrent, chronic ([place]) 06/11/2014  . Cervical herniated disc 06/11/2014  . Cigarette nicotine dependence without complication [id number]  . Lumbar herniated disc 06/11/2014  . Perimenopausal symptoms 06/11/2014   Past Medical History:  Diagnosis Date  . Arthritis   . Asthma   . Cancer ([place])    chemo for breast ca  x31mos  . Depression   .  Deviated nasal septum   . Hearing loss 06/11/2014   Bilateral  . Herniated cervical disc   . Hypertension   . Migraine   . Miscarriage    2  . MS (multiple sclerosis) ([place])     Family History  Adopted: Yes  Problem Relation Age of Onset  . Cancer Maternal Uncle        throat    Past Surgical History:  Procedure Laterality Date  . BREAST BIOPSY Right 11/21/2018   Malignant  . HEMATOMA EVACUATION Right 05/01/2019   Procedure: EVACUATION HEMATOMA;  Surgeon: Rolm Bookbinder, MD;  Location: [place] [facility];  Service: General;  Laterality: Right;  . MASTECTOMY Right 04/30/2019  . MASTECTOMY W/ SENTINEL NODE BIOPSY Right 04/30/2019   Procedure: RIGHT MASTECTOMY WITH  RIGHT AXILLARY SENTINEL LYMPH NODE BIOPSY INJECT BLUE DYE;  Surgeon: Rolm Bookbinder, MD;  Location: [facility];  Service: General;  Laterality: Right;  . NASAL SEPTUM SURGERY    . SIMPLE MASTECTOMY WITH AXILLARY SENTINEL NODE BIOPSY Right 04/30/2019  . spinal injections     Social History   Occupational History  . Occupation: Disabled  Tobacco Use  . Smoking status: Current Every Day Smoker    Packs/day: 1.00    Years: 44.00    Pack years: 44.00    Types: Cigarettes  . Smokeless tobacco: Never Used  . Tobacco comment: 1 pk per day  Substance and Sexual  Activity  . Alcohol use: Yes    Comment: 0-5 beers daily  . Drug use: Yes    Types: Marijuana  . Sexual activity: Not Currently    Partners: Male

## 2020-02-21 ENCOUNTER — Telehealth: Payer: Self-pay | Admitting: Orthopaedic Surgery

## 2020-02-21 NOTE — Telephone Encounter (Signed)
Patient called requesting stronger pain medication. Pain said current pain medication is not touching the pain. Please send to pharmacy on file. Patient phone number is [phone].

## 2020-02-21 NOTE — Telephone Encounter (Signed)
Patient called.   She wants to know if she can be given something stronger than diclofenac. Says she has already been on that.   Call back: [phone]

## 2020-02-21 NOTE — Telephone Encounter (Signed)
Message already sent to [facility]. Pending response.    *Duplicate message*

## 2020-02-22 ENCOUNTER — Telehealth: Payer: Self-pay | Admitting: Neurology

## 2020-02-22 ENCOUNTER — Encounter: Payer: Self-pay | Admitting: *Deleted

## 2020-02-22 NOTE — Telephone Encounter (Signed)
If needing for pain, will you call in tramadol?  50mg  take 1 tab po tid prn pain

## 2020-02-22 NOTE — Telephone Encounter (Signed)
MRIs of the brain and cervical spine were reviewed with her.   Brain is unchanged - no new MS lesions.  Spine shows old MS lesions and DJD.  Changes at C5C6 have progressed compared to previous MRI with moderate spinal stenosis but no definite nerve root compression   If symptoms worsen, could refer for spine surgical evaluation

## 2020-02-22 NOTE — Telephone Encounter (Signed)
She states she has Tramadol already. Uses Pharm [facility]

## 2020-02-22 NOTE — Telephone Encounter (Signed)
See message below. Can you send something else to her pharm.

## 2020-02-25 ENCOUNTER — Telehealth: Payer: Self-pay | Admitting: Orthopaedic Surgery

## 2020-02-25 NOTE — Telephone Encounter (Signed)
Message already in [facility].

## 2020-02-25 NOTE — Telephone Encounter (Signed)
Tylenol #3 and tramadol is strongest we can do.  Let me know if she would like tylenol #3

## 2020-02-25 NOTE — Telephone Encounter (Signed)
Would like Tylenol #3. Can you send this into [facility].

## 2020-02-25 NOTE — Telephone Encounter (Signed)
Patient called requesting Tylenol 3 be sent to pharmacy on file. Patient needed an update. Patient is requesting a phone call when medication has been called in. Patient phone number is [phone].

## 2020-02-26 ENCOUNTER — Encounter: Payer: Self-pay | Admitting: Family Medicine

## 2020-02-26 ENCOUNTER — Other Ambulatory Visit: Payer: Self-pay | Admitting: Physician Assistant

## 2020-02-26 ENCOUNTER — Other Ambulatory Visit: Payer: Self-pay

## 2020-02-26 ENCOUNTER — Ambulatory Visit: Payer: Self-pay

## 2020-02-26 ENCOUNTER — Ambulatory Visit (INDEPENDENT_AMBULATORY_CARE_PROVIDER_SITE_OTHER): Payer: Medicare HMO | Admitting: Family Medicine

## 2020-02-26 DIAGNOSIS — M25512 Pain in left shoulder: Secondary | ICD-10-CM

## 2020-02-26 MED ORDER — ACETAMINOPHEN-CODEINE #3 300-30 MG PO TABS: 1.0000 | ORAL_TABLET | Freq: Three times a day (TID) | ORAL | 0 refills | Status: DC | PRN

## 2020-02-26 NOTE — Progress Notes (Signed)
Debbie Watts - 59 y.o. female MRN [id number]  Date of birth: Feb 22, 1961  Office Visit Note: Visit Date: 02/26/2020 PCP: Debbie Seat, MD Referred by: Debbie Seat, MD  Subjective: Chief Complaint  Patient presents with  . Left Shoulder - Pain   HPI: Debbie Watts is a 59 y.o. female who comes in today as a referral from Dr. Erlinda Watts for glenohumeral corticosteroid injection for her adhesive capsulitis.   ROS Otherwise per HPI.  Assessment & Plan: Visit Diagnoses:  1. Left shoulder pain, unspecified chronicity     Plan: Glenohumeral cortisone injection today for adhesive capsulitis, referred from Dr. Erlinda Watts. Moderate relief during anesthetic phase. Follow up as needed.   Orders Placed This Encounter  Procedures  . US Guided Needle Placement    Follow-up: No follow-ups on file.   Procedures: Procedure performed: Glenohumeral corticosteroid injection, ultrasound-guided  Ultrasound-guided left glenohumeral injection: After sterile prep with Betadine, injected 8 cc 1% lidocaine without epinephrine and 40 mg methylprednisolone using a 22-gauge spinal needle, passing the needle through approach into the glenohumeral joint.  Injectate was seen filling the joint capsule.  She had good immediate relief during the anesthetic phase.     Clinical History: No specialty comments available.   She reports that she has been smoking cigarettes. She has a 44.00 pack-year smoking history. She has never used smokeless tobacco. No results for input(s): HGBA1C, LABURIC in the last 8760 hours.  Objective:  VS:  HT:    WT:   BMI:     BP:   HR: bpm  TEMP: ( )  RESP:  Physical Exam  PHYSICAL EXAM: Gen: NAD, alert, cooperative with exam, well-appearing HEENT: clear conjunctiva,  CV:  no edema, capillary refill brisk, normal rate Resp: non-labored Skin: no rashes, normal turgor  Neuro: no gross deficits.  Psych:  alert and oriented  Ortho Exam  Shoulder: Inspection reveals no  obvious deformity, atrophy, or asymmetry. No bruising. No swelling Palpation is normal with no TTP over [facility] joint or bicipital groove. Limited ROM with limited abduction, flexion due to pain Hawkins,  neers +   Imaging: No results found.  Past Medical/Family/Surgical/Social History: Medications & Allergies reviewed per EMR, new medications updated. Patient Active Problem List   Diagnosis Date Noted  . Chronic left shoulder pain 02/14/2020  . Shingles 12/24/2019  . Breast cancer, right ([place]) 04/30/2019  . Port-A-Cath in place 02/13/2019  . Syncope 01/19/2019  . Nausea vomiting and diarrhea 01/19/2019  . Leukocytosis 01/19/2019  . Malignant neoplasm of upper-outer quadrant of right breast in female, estrogen receptor positive ([place]) 11/27/2018  . Neurogenic claudication due to lumbar spinal stenosis 09/19/2018  . Urinary tract infection without hematuria 08/10/2018  . Chronic left-sided thoracic back pain 07/20/2018  . Tendon nodule 04/21/2018  . Solar lentigo 04/21/2018  . Preventative health care 04/20/2018  . Acute cystitis without hematuria 03/16/2018  . Gait disturbance 01/12/2018  . Polyuria 12/08/2017  . Easy bruising 08/31/2017  . Chronic obstructive pulmonary disease ([place]) 11/30/2016  . Essential hypertension 01/26/2016  . Migraine headache 06/23/2014  . Multiple sclerosis ([place]) 06/23/2014  . Paresthesias 06/23/2014  . Restless leg syndrome 06/23/2014  . Hearing loss, bilateral 06/11/2014  . Major depression, recurrent, chronic ([place]) 06/11/2014  . Cervical herniated disc 06/11/2014  . Cigarette nicotine dependence without complication [id number]  . Lumbar herniated disc 06/11/2014  . Perimenopausal symptoms 06/11/2014   Past Medical History:  Diagnosis Date  . Arthritis   . Asthma   . Cancer (  [place])    chemo for breast ca  x39mos  . Depression   . Deviated nasal septum   . Hearing loss 06/11/2014   Bilateral  . Herniated cervical disc   . Hypertension   .  Migraine   . Miscarriage    2  . MS (multiple sclerosis) ([place])    Family History  Adopted: Yes  Problem Relation Age of Onset  . Cancer Maternal Uncle        throat   Past Surgical History:  Procedure Laterality Date  . BREAST BIOPSY Right 11/21/2018   Malignant  . HEMATOMA EVACUATION Right 05/01/2019   Procedure: EVACUATION HEMATOMA;  Surgeon: Rolm Bookbinder, MD;  Location: [facility];  Service: General;  Laterality: Right;  . MASTECTOMY Right 04/30/2019  . MASTECTOMY W/ SENTINEL NODE BIOPSY Right 04/30/2019   Procedure: RIGHT MASTECTOMY WITH  RIGHT AXILLARY SENTINEL LYMPH NODE BIOPSY INJECT BLUE DYE;  Surgeon: Rolm Bookbinder, MD;  Location: [place];  Service: General;  Laterality: Right;  . NASAL SEPTUM SURGERY    . SIMPLE MASTECTOMY WITH AXILLARY SENTINEL NODE BIOPSY Right 04/30/2019  . spinal injections     Social History   Occupational History  . Occupation: Disabled  Tobacco Use  . Smoking status: Current Every Day Smoker    Packs/day: 1.00    Years: 44.00    Pack years: 44.00    Types: Cigarettes  . Smokeless tobacco: Never Used  . Tobacco comment: 1 pk per day  Substance and Sexual Activity  . Alcohol use: Yes    Comment: 0-5 beers daily  . Drug use: Yes    Types: Marijuana  . Sexual activity: Not Currently    Partners: Male

## 2020-02-26 NOTE — Progress Notes (Signed)
I saw and examined the patient with Dr. Mayer Masker and agree with assessment and plan as outlined.    Glenohumeral injection performed for frozen shoulder.

## 2020-02-26 NOTE — Telephone Encounter (Signed)
Sent in

## 2020-03-03 ENCOUNTER — Encounter: Payer: Self-pay | Admitting: Rehabilitation

## 2020-03-03 ENCOUNTER — Ambulatory Visit: Payer: Medicare HMO | Attending: Hematology and Oncology | Admitting: Rehabilitation

## 2020-03-03 ENCOUNTER — Other Ambulatory Visit: Payer: Self-pay

## 2020-03-03 DIAGNOSIS — C50411 Malignant neoplasm of upper-outer quadrant of right female breast: Secondary | ICD-10-CM | POA: Insufficient documentation

## 2020-03-03 DIAGNOSIS — R293 Abnormal posture: Secondary | ICD-10-CM | POA: Diagnosis not present

## 2020-03-03 DIAGNOSIS — Z170 Estrogen receptor positive status [ER+]: Secondary | ICD-10-CM | POA: Diagnosis not present

## 2020-03-03 DIAGNOSIS — M25512 Pain in left shoulder: Secondary | ICD-10-CM | POA: Diagnosis not present

## 2020-03-03 DIAGNOSIS — M6281 Muscle weakness (generalized): Secondary | ICD-10-CM | POA: Insufficient documentation

## 2020-03-03 DIAGNOSIS — M25611 Stiffness of right shoulder, not elsewhere classified: Secondary | ICD-10-CM | POA: Insufficient documentation

## 2020-03-03 DIAGNOSIS — M25612 Stiffness of left shoulder, not elsewhere classified: Secondary | ICD-10-CM | POA: Diagnosis not present

## 2020-03-03 NOTE — Therapy (Signed)
[place], [place], [address] Phone: [phone]   Fax:  [phone]  Physical Therapy Evaluation  Patient Details  Name: Debbie Watts MRN: [id number] Date of Birth: [birth year] Referring Provider (PT): Dr. Erlinda Hong   Encounter Date: 03/03/2020  PT End of Session - 03/03/20 1329    Visit Number  1    Number of Visits  9    Date for PT Re-Evaluation  04/14/20    Authorization Type  [employer] - Needs Authorization    PT Start Time  1304    PT Stop Time  1340    PT Time Calculation (min)  36 min    Activity Tolerance  Patient tolerated treatment well    Behavior During Therapy  [facility] for tasks assessed/performed       Past Medical History:  Diagnosis Date  . Arthritis   . Asthma   . Cancer ([place])    chemo for breast ca  x73mo  . Depression   . Deviated nasal septum   . Hearing loss 06/11/2014   Bilateral  . Herniated cervical disc   . Hypertension   . Migraine   . Miscarriage    2  . MS (multiple sclerosis) ([place]     Past Surgical History:  Procedure Laterality Date  . BREAST BIOPSY Right 11/21/2018   Malignant  . HEMATOMA EVACUATION Right 05/01/2019   Procedure: EVACUATION HEMATOMA;  Surgeon: WRolm Bookbinder MD;  [facility]  Service: General;  Laterality: Right;  . MASTECTOMY Right 04/30/2019  . MASTECTOMY W/ SENTINEL NODE BIOPSY Right 04/30/2019   Procedure: RIGHT MASTECTOMY WITH  RIGHT AXILLARY SENTINEL LYMPH NODE BIOPSY INJECT BLUE DYE;  Surgeon: WRolm Bookbinder MD;  [facility]  Service: General;  Laterality: Right;  . NASAL SEPTUM SURGERY    . SIMPLE MASTECTOMY WITH AXILLARY SENTINEL NODE BIOPSY Right 04/30/2019  . spinal injections      There were no vitals filed for this visit.   Subjective Assessment - 03/03/20 1309    Subjective  I am having less pain but still stiff    Pertinent History  Pt recently seen here for same shoulder pain now returning with  less pain with cortisone shot. Rt mastectomy 04/30/19 with SLNB due to grade 2 IDC, high grade DCIS 0/2 lymph nodes. ER/PR positive, HER2 equivocal and then Rt breast hematoma evacuation on 05/01/19 by Dr. WDonne Hazel  Neo adjuvant chemotherapy [facility] started 12/12/18 to current. anastrozole x 7 years. Other history includes MS ( currently under control so will not address this episode) , HTN, OA, asthma, bilateral hearing loss    Limitations  --   wash my back   Patient Stated Goals  more motion    Currently in Pain?  No/denies         [facility]03/20 0001      Assessment   Medical Diagnosis  Rt breast cancer    Referring Provider (PT)  Dr. XErlinda Hong   Hand Dominance  Right    Prior Therapy  yes      Precautions   Precaution Comments  lymphedema, HOH, MS      Restrictions   Weight Bearing Restrictions  No      Balance Screen   Has the patient fallen in the past 6 months  No    Has the patient had a decrease in activity level because of a fear of falling?   No  Is the patient reluctant to leave their home because of a fear of falling?   No      Home Environment   Living Environment  Private residence    Living Arrangements  Spouse/significant other    Available Help at Discharge  Family    Additional Comments  pt has to help boyfriend with his self care that sometimes irritates her shoulder       Prior Function   Level of Independence  Independent    Leisure  takes care of boyfriend and his grandson, no formal exercise       Cognition   Overall Cognitive Status  Within Functional Limits for tasks assessed      Observation/Other Assessments   Skin Integrity  no edema present     Other Surveys   Quick Dash    Quick DASH   29.55      Coordination   Gross Motor Movements are Fluid and Coordinated  No      Posture/Postural Control   Posture/Postural Control  Postural limitations    Postural Limitations  Rounded Shoulders;Forward head      AROM   Overall AROM    Deficits    Right Shoulder Flexion  154 Degrees    Right Shoulder ABduction  146 Degrees    Right Shoulder Internal Rotation  75 Degrees    Right Shoulder External Rotation  90 Degrees    Left Shoulder Flexion  125 Degrees   pain axilla   Left Shoulder ABduction  115 Degrees   pain deltoid region   Left Shoulder Internal Rotation  30 Degrees   pain   Left Shoulder External Rotation  75 Degrees      PROM   Right/Left Shoulder  Left    Left Shoulder Flexion  132 Degrees    Left Shoulder ABduction  100 Degrees    Left Shoulder Internal Rotation  70 Degrees    Left Shoulder External Rotation  37 Degrees   0 deg at 90/90 position     Palpation   Palpation comment  only mild anterior shoulder tenderness;  AP glide mildly restricted, inferior glide WNL             Quick Dash - 03/03/20 0001    Open a tight or new jar  Moderate difficulty    Do heavy household chores (wash walls, wash floors)  Moderate difficulty    Carry a shopping bag or briefcase  Mild difficulty    Wash your back  Severe difficulty    Use a knife to cut food  No difficulty    Recreational activities in which you take some force or impact through your arm, shoulder, or hand (golf, hammering, tennis)  Mild difficulty    During the past week, to what extent has your arm, shoulder or hand problem interfered with your normal social activities with family, friends, neighbors, or groups?  Slightly    During the past week, to what extent has your arm, shoulder or hand problem limited your work or other regular daily activities  Not at all    Arm, shoulder, or hand pain.  Mild    Tingling (pins and needles) in your arm, shoulder, or hand  Mild    Difficulty Sleeping  Mild difficulty    DASH Score  29.55 %        Objective measurements completed on examination: See above findings.      [facility] Adult PT Treatment/Exercise - 03/03/20 0001        Shoulder Exercises: Pulleys   Flexion  2 minutes    Flexion  Limitations  with cueing on initial performance    Scaption  2 minutes    Scaption Limitations  with cueing for initial performance      Shoulder Exercises: Stretch   Internal Rotation Stretch  5 reps    Internal Rotation Stretch Limitations  10 seconds with towel              PT Education - 03/03/20 1328    Education Details  POC    Person(s) Educated  Patient    Methods  Explanation    Comprehension  Verbalized understanding          PT Long Term Goals - 12/31/19 1606      PT LONG TERM GOAL #1   Title  Pt will report she can reach overhead to get something off a shelf with 50% less pain    Baseline  80% improvement at this time-12/31/19    Status  Achieved      PT LONG TERM GOAL #2   Title  Pt will have 140 degrees of left shoulder abduction so that she can perform her activities of daily living easier    Baseline  147 on 12/11/2019; 140 degrees - 12/31/19    Status  Achieved      PT LONG TERM GOAL #3   Title  Pt will decrease her Quick DASH score to < 20 indicating a functional improvment of left shoulder    Baseline  47.73 on 11/27/2019; 15.91 - 12/31/19    Status  Achieved             Plan - 03/03/20 1345    Clinical Impression Statement  Pt returns to PT for treatment of her left frozen shoulder after being discharged from this clinic in March.  Pt just got a cortisone injection and now has minimal pain in the shoulder but still with decreased ROM and referred deltoid type pain with end range. Overall active and passive limitations worsening from discharge visit a few months ago.  QDASH has also worsened by about 10%.  Pt will benefit form PT at this time to improve shouder ROM and functioning.    Personal Factors and Comorbidities  Comorbidity 2    Comorbidities  MS, previous chemo    Examination-Activity Limitations  Reach Overhead;Lift;Hygiene/Grooming;Dressing;Bathing;Toileting    Stability/Clinical Decision Making  Stable/Uncomplicated    Clinical Decision  Making  Low    Rehab Potential  Good    PT Frequency  2x / week    PT Duration  4 weeks    PT Treatment/Interventions  ADLs/Self Care Home Management;Iontophoresis 37m/ml Dexamethasone;Electrical Stimulation;Moist Heat;Therapeutic activities;Therapeutic exercise;Neuromuscular re-education;Patient/family education;Manual techniques;Manual lymph drainage;Passive range of motion;Taping;Joint Manipulations    PT Next Visit Plan  Left shoulder PROM, AAROM, scapular and postural exercises    PT Home Exercise Plan  currently has rockwood exercises, wall walking, towel up the back stretch, and information on ordering pulleys    Consulted and Agree with Plan of Care  Patient       Patient will benefit from skilled therapeutic intervention in order to improve the following deficits and impairments:  Decreased range of motion, Decreased strength, Increased fascial restricitons, Impaired UE functional use, Postural dysfunction, Impaired perceived functional ability, Increased muscle spasms, Pain  Visit Diagnosis: Stiffness of left shoulder joint  Muscle weakness (generalized)  Abnormal posture     Problem List Patient Active Problem List   Diagnosis Date  Noted  . Chronic left shoulder pain 02/14/2020  . Shingles 12/24/2019  . Breast cancer, right ([facility]) 04/30/2019  . Port-A-Cath in place 02/13/2019  . Syncope 01/19/2019  . Nausea vomiting and diarrhea 01/19/2019  . Leukocytosis 01/19/2019  . Malignant neoplasm of upper-outer quadrant of right breast in female, estrogen receptor positive ([facility]) 11/27/2018  . Neurogenic claudication due to lumbar spinal stenosis 09/19/2018  . Urinary tract infection without hematuria 08/10/2018  . Chronic left-sided thoracic back pain 07/20/2018  . Tendon nodule 04/21/2018  . Solar lentigo 04/21/2018  . Preventative health care 04/20/2018  . Acute cystitis without hematuria 03/16/2018  . Gait disturbance 01/12/2018  . Polyuria 12/08/2017  . Easy bruising  08/31/2017  . Chronic obstructive pulmonary disease ([facility]) 11/30/2016  . Essential hypertension 01/26/2016  . Migraine headache 06/23/2014  . Multiple sclerosis ([facility]) 06/23/2014  . Paresthesias 06/23/2014  . Restless leg syndrome 06/23/2014  . Hearing loss, bilateral 06/11/2014  . Major depression, recurrent, chronic ([facility]) 06/11/2014  . Cervical herniated disc 06/11/2014  . Cigarette nicotine dependence without complication 06/11/2014  . Lumbar herniated disc 06/11/2014  . Perimenopausal symptoms 06/11/2014    Tevis, Kara R 03/03/2020, 1:49 PM  [facility] [address] , [place], [address] Phone: [phone]   Fax:  [phone]  Name: Debbie Watts MRN: [id number] Date of Birth: 04/16/1961  

## 2020-03-05 ENCOUNTER — Encounter: Payer: Self-pay | Admitting: *Deleted

## 2020-03-05 NOTE — Progress Notes (Unsigned)

## 2020-03-06 ENCOUNTER — Other Ambulatory Visit: Payer: Self-pay

## 2020-03-06 ENCOUNTER — Ambulatory Visit: Payer: Medicare HMO | Admitting: Neurology

## 2020-03-06 ENCOUNTER — Encounter: Payer: Self-pay | Admitting: Neurology

## 2020-03-06 VITALS — BP 119/78 | HR 92 | Temp 97.1°F | Ht 62.0 in | Wt 115.5 lb

## 2020-03-06 DIAGNOSIS — R202 Paresthesia of skin: Secondary | ICD-10-CM | POA: Diagnosis not present

## 2020-03-06 DIAGNOSIS — M542 Cervicalgia: Secondary | ICD-10-CM | POA: Diagnosis not present

## 2020-03-06 DIAGNOSIS — R269 Unspecified abnormalities of gait and mobility: Secondary | ICD-10-CM

## 2020-03-06 DIAGNOSIS — G4489 Other headache syndrome: Secondary | ICD-10-CM

## 2020-03-06 DIAGNOSIS — Z79899 Other long term (current) drug therapy: Secondary | ICD-10-CM

## 2020-03-06 DIAGNOSIS — Z170 Estrogen receptor positive status [ER+]: Secondary | ICD-10-CM

## 2020-03-06 DIAGNOSIS — G35 Multiple sclerosis: Secondary | ICD-10-CM | POA: Diagnosis not present

## 2020-03-06 DIAGNOSIS — C50411 Malignant neoplasm of upper-outer quadrant of right female breast: Secondary | ICD-10-CM

## 2020-03-06 NOTE — Progress Notes (Signed)
[facility]  PATIENT: Debbie Watts DOB: Feb 26, 1961    HISTORICAL  CHIEF COMPLAINT:  Chief Complaint  Patient presents with  . Follow-up    RM 13, alone. Last seen 09/12/2019  . Multiple Sclerosis    On Tecfidera  . Pain    Takes tramadol    HISTORY OF PRESENT ILLNESS:  Debbie Watts is a 59 y.o. woman with relapsing remitting multiple sclerosis.  Update 03/06/2020: Her MS is doing well.    She is on Tecfidera and tolerates it well.   No recent exacerbation or new neurologic symptoms.    She is walking about the same with reduced balance, some stumbling but no falls.   Her right leg is weaker than her left and more clumsy.   Arms are more symmetric.    She has left shoulder pain and had a recent injection  She has frequent headaches arising form the left occiput.   Pain is constant but fluctuates quite a bit from mild to more severe.    Mood is doing better.   She notes some fatigue.  She sleeps ok most nights but some sleep maintenance insomnia.   She has breast cancer and has done well.  She is off all med's now except anastrozole.  Update 09/12/2019: She feels she is stable and has no exacerbations.   She is on Tecfidera and tolerates it well.    She feels her gait is the same (off balanced) and has no recent falls.  Legs seem a little weak if she squats.   She has dysesthesias in hr legs/feet.    Bladder function is better,   Vision is fine.    She has fatigue.  She sleeps well some nights.    She notes pain in her left arm when she reaches up.   The surgery was the opposite side.   She has LBP also.    She takes tramadol for pain and oxycodone if more pain.    She has some stress issues but feels mood is stable.   She had breast cancer surgery in June and is on chemotherapy (Perjeta and Kadcycla and anastrozole).   She had Grade 2 IDC   She sees Dr. Lindi Adie.  Her son, daughter-in law and two kids are now living with her and her daughter-in-law has  psych issues.    Update 03/20/2019 (virtual): She denies new MS exacerbations or new symptoms.   She is on Tecfidera and tolerates it well.      Her gait is doing ok and she could walk 20 minutes without stopping (> 1 mile).   She fell 2 months ago but no injury.   She denies leg weakness.    No issues with dysesthesias.   She has urinary frequency but stopped oxybutynin  Due to UTI'sd.   Vision is doing ok..    Migraines are doing ok and she will take Tylenol or Excedrin Migraine with benefit   These occur 4-5 times a month.      She feels fatigue occurs almost every day, more in the afternoons.      Sleep is variable.   RLS is helped by Sinemet and gabepentin.     She has some depression though Paxil has helped.       She was diagnosed with breast cancer January 2020.     She is on TCHP (docetaxel, carboplatin, trastuzumab, pertuzumab (Perjeta)).   She has had 5 cycles of chemotherapy and her last  infusion will be June 2.  Labs were checked and lymphocyte counts have not dropped.    Update 09/19/2018: She feels she is mostly stable.   She denies exacerbations or new symptoms.   She is on Tecfidera and tolerates it well.      She feels gait is doing well and she could easily walk a mile.   She had one fall.   She has LBP if she stands or walk a while.  She  Also has RLS ans is helped by Sinemet at night.  She has urinary frequency but stopped oxybutynin due to frequent UTI's.  Sometimes she wakes up with headaches and takes Tylenol or Excedrin Migraine.   These occur 4-5 times a month.      She feels fatigue occurs almost every day, more in the afternoons.      She sleeps well most nights.    She has some depression though Paxil has helped.       01/12/2018: She is currently on Tecfidera.  Before that she was on a study drug (ALKS 8700) that was very similar to take daily.  She feels her MS is doing well.  She has not had any exacerbations.  She is tolerating the medication well with no GI side  effects or flushing.  She is walking ok and could walk 20 minutes or about a mile if she had to.     However, her back hurts if she stands a long time or walks longer.    She feels a little weaker on her right side and she has numbness in her right hand.    She has noted more urinary urgency the past year.    She wakes up 2 -3 times nightly for nocturia.  Other times, she notes hesitancy and sometimes does not completely empty.   She has no recent UTI.    Vision is stable.   She wearss glasses and has not had ON or diplopia in the past.      She has fatigue.    She has some sleep onset insomnia.   She takes Sinemet and gabeontin for RLS with benefit.  She wakes up to use the bathroom and sometimes has trouble falling back asleep.    She has some stress with her boyfriend needing a foot amputation (DM, gangrene).   She sometimes has mild depression but not enough to consider a medication.   Cognitively, she is doing well.     MS history: She was diagnosed with MS in 1999 based on MRI and lumbar rupture.  At that time she was living in [place] and was seeing Dr. Tilden Fossa.  When he retired she saw another doctor and then she moved to this area around 2015 but did not seek neurologic referral until 2015.  She was on Avonex for many years but stopped due to insurance.  Then, in November 2016, she started in the drug study and was on ALKS 8700 4 2 years.  The study ended in late 2018 for her and she switched over to [place].   Her last MRI 08/09/2017 showed stable MS lesions.       REVIEW OF SYSTEMS: Constitutional: No fevers, chills, sweats, or change in appetite.  She has fatigue.  She sometimes has insomnia. Eyes: No visual changes, double vision, eye pain Ear, nose and throat: No hearing loss, ear pain, nasal congestion, sore throat Cardiovascular: No chest pain, palpitations Respiratory: No shortness of breath at rest or with exertion.  No wheezes GastrointestinaI: No nausea, vomiting, diarrhea,  abdominal pain, fecal incontinence Genitourinary:She has urinary frequency and nocturia . Musculoskeletal: No neck pain, back pain Integumentary: No rash, pruritus, skin lesions Neurological: as above Psychiatric: No depression at this time.  No anxiety Endocrine: No palpitations, diaphoresis, change in appetite, change in weigh or increased thirst Hematologic/Lymphatic: No anemia, purpura, petechiae. Allergic/Immunologic: No itchy/runny eyes, nasal congestion, recent allergic reactions, rashes  ALLERGIES: Allergies  Allergen Reactions  . Procaine Shortness Of Breath    HOME MEDICATIONS:  Current Outpatient Medications:  .  acetaminophen-codeine (TYLENOL #3) 300-30 MG tablet, Take 1 tablet by mouth 3 (three) times daily as needed for moderate pain., Disp: 30 tablet, Rfl: 0 .  albuterol (VENTOLIN HFA) 108 (90 Base) MCG/ACT inhaler, Inhale 2 puffs into the lungs every 6 (six) hours as needed for wheezing or shortness of breath., Disp: , Rfl:  .  anastrozole (ARIMIDEX) 1 MG tablet, Take 1 tablet (1 mg total) by mouth daily., Disp: 90 tablet, Rfl: 3 .  aspirin-acetaminophen-caffeine (EXCEDRIN MIGRAINE) 250-250-65 MG tablet, Take 2 tablets by mouth every 6 (six) hours as needed for migraine., Disp: , Rfl:  .  betamethasone valerate lotion (VALISONE) 0.1 %, Apply 1 application topically daily as needed for irritation. , Disp: , Rfl:  .  carbidopa-levodopa (SINEMET IR) 10-100 MG tablet, Take 1 tablet by mouth at bedtime., Disp: 90 tablet, Rfl: 3 .  diclofenac (VOLTAREN) 75 MG EC tablet, Take 1 tablet (75 mg total) by mouth 2 (two) times daily., Disp: 30 tablet, Rfl: 2 .  diphenhydrAMINE (BENADRYL) 25 MG tablet, Take 25 mg by mouth at bedtime as needed. , Disp: , Rfl:  .  fluconazole (DIFLUCAN) 100 MG tablet, Take 1 tablet (100 mg total) by mouth daily., Disp: 30 tablet, Rfl: 0 .  fluticasone (FLONASE) 50 MCG/ACT nasal spray, Place 2 sprays into both nostrils at bedtime., Disp: , Rfl:  .   gabapentin (NEURONTIN) 300 MG capsule, Take 2 capsules (600 mg total) by mouth 2 (two) times daily., Disp: 360 capsule, Rfl: 3 .  lisinopril (ZESTRIL) 5 MG tablet, TAKE 1 TABLET BY MOUTH EVERY DAY, Disp: 90 tablet, Rfl: 1 .  Meloxicam 15 MG TBDP, Take 15 mg by mouth daily., Disp: 30 tablet, Rfl: 0 .  methocarbamol (ROBAXIN) 500 MG tablet, Take 1 tablet (500 mg total) by mouth every 8 (eight) hours as needed for muscle spasms., Disp: 20 tablet, Rfl: 1 .  Multiple Vitamin (MULTIVITAMIN WITH MINERALS) TABS tablet, Take 2 tablets by mouth daily., Disp: , Rfl:  .  omeprazole (PRILOSEC OTC) 20 MG tablet, Take 20 mg by mouth daily as needed (acid reflux)., Disp: , Rfl:  .  oxyCODONE-acetaminophen (PERCOCET/ROXICET) 5-325 MG tablet, Take 1 tablet by mouth every 8 (eight) hours as needed for severe pain., Disp: 60 tablet, Rfl: 0 .  PARoxetine (PAXIL) 20 MG tablet, TAKE 1 TABLET BY MOUTH EVERY DAY, Disp: 90 tablet, Rfl: 1 .  TECFIDERA 240 MG CPDR, Take 1 capsule by mouth twice daily, Disp: 180 capsule, Rfl: 3 .  traMADol (ULTRAM) 50 MG tablet, TAKE 1 TABLET BY MOUTH 2 TIMES DAILY AS NEEDED., Disp: 60 tablet, Rfl: 5 .  valACYclovir (VALTREX) 1000 MG tablet, Take 1 tablet (1,000 mg total) by mouth 2 (two) times daily., Disp: 14 tablet, Rfl: 0  PAST MEDICAL HISTORY: Past Medical History:  Diagnosis Date  . Arthritis   . Asthma   . Cancer ([place])    chemo for breast ca  x33mos  .  Depression   . Deviated nasal septum   . Hearing loss 06/11/2014   Bilateral  . Herniated cervical disc   . Hypertension   . Migraine   . Miscarriage    2  . MS (multiple sclerosis) ([place])     PAST SURGICAL HISTORY: Past Surgical History:  Procedure Laterality Date  . BREAST BIOPSY Right 11/21/2018   Malignant  . HEMATOMA EVACUATION Right 05/01/2019   Procedure: EVACUATION HEMATOMA;  Surgeon: Rolm Bookbinder, MD;  Location: [facility];  Service: General;  Laterality: Right;  . MASTECTOMY Right 04/30/2019  . MASTECTOMY W/  SENTINEL NODE BIOPSY Right 04/30/2019   Procedure: RIGHT MASTECTOMY WITH  RIGHT AXILLARY SENTINEL LYMPH NODE BIOPSY INJECT BLUE DYE;  Surgeon: Rolm Bookbinder, MD;  Location: [facility];  Service: General;  Laterality: Right;  . NASAL SEPTUM SURGERY    . SIMPLE MASTECTOMY WITH AXILLARY SENTINEL NODE BIOPSY Right 04/30/2019  . spinal injections      FAMILY HISTORY: Family History  Adopted: Yes  Problem Relation Age of Onset  . Cancer Maternal Uncle        throat    SOCIAL HISTORY:  Social History   Socioeconomic History  . Marital status: Single    Spouse name: Not on file  . Number of children: 2  . Years of education: 37  . Highest education level: Not on file  Occupational History  . Occupation: Disabled  Tobacco Use  . Smoking status: Current Every Day Smoker    Packs/day: 1.00    Years: 44.00    Pack years: 44.00    Types: Cigarettes  . Smokeless tobacco: Never Used  . Tobacco comment: 1 pk per day  Substance and Sexual Activity  . Alcohol use: Yes    Comment: 0-5 beers daily  . Drug use: Yes    Types: Marijuana  . Sexual activity: Not Currently    Partners: Male  Other Topics Concern  . Not on file  Social History Narrative   Patient is single with 2 children.   Patient is right handed.   Current Social History 08/10/2018        Right handed       Patient lives with significant other [name]) in one level Townhome 08/10/2018    Transportation: Patient has own vehicle  08/10/2018   Important Relationships [name] 08/10/2018    Pets: one dog named Browser 08/10/2018   Education / Work:  12th grade/ Disabled 08/10/2018   Interests / Fun: Watching baseball and Nascar 08/10/2018   Current Stressors: Significant other is depressed over his poor health 08/10/2018   Religious / Personal Beliefs: "I believe in God" 08/10/2018   Other: "I had a miscarriage before I had my 2 sons." 08/10/2018   L. Ducatte, RN, BSN                                                                                                  Social Determinants of Health   Financial Resource Strain:   . Difficulty of Paying Living Expenses:   Food Insecurity:   . Worried About  Running Out of Food in the Last Year:   . [place] in the Last Year:   Transportation Needs:   . Lack of Transportation (Medical):   Marland Kitchen Lack of Transportation (Non-Medical):   Physical Activity:   . Days of Exercise per Week:   . Minutes of Exercise per Session:   Stress:   . Feeling of Stress :   Social Connections:   . Frequency of Communication with Friends and Family:   . Frequency of Social Gatherings with Friends and Family:   . Attends Religious Services:   . Active Member of Clubs or Organizations:   . Attends Archivist Meetings:   Marland Kitchen Marital Status:   Intimate Partner Violence:   . Fear of Current or Ex-Partner:   . Emotionally Abused:   Marland Kitchen Physically Abused:   . Sexually Abused:      PHYSICAL EXAM  Vitals:   03/06/20 1028  BP: 119/78  Pulse: 92  Temp: (!) 97.1 F (36.2 C)  SpO2: 96%  Weight: 115 lb 8 oz (52.4 kg)  Height: 5\' 2"  (1.575 m)    Body mass index is 21.13 kg/m.   General: The patient is well-developed and well-nourished and in no acute distress.   She has tenderness in the left occiput radiating up her skull with deep palpation. Neurologic Exam  Mental status: The patient is alert and oriented x 3 at the time of the examination. The patient has apparent normal recent and remote memory, with an apparently normal attention span and concentration ability.   Speech is normal.  Cranial nerves: Extraocular movements are full.  Facial strength and sensation was normal.  The trapezius strength was normal bilaterally.  The tongue is midline, and the patient has symmetric elevation of the soft palate. Bilateral hearing deficits are noted.  Motor:  Muscle bulk is normal.   Tone is normal. Strength is  5 / 5 in all 4 extremities.   Sensory: She had  slight asymmetry of sensation, reduced on the left to touch and vibration  Coordination: Cerebellar testing shows good finger-nose-finger.  Heel-to-shin is mildly reduced bilaterally.  Gait and station: Station is normal.  Gait is mildly wide.  Tandem gait is wide.  Romberg is negative.  Reflexes: Deep tendon reflexes are symmetric and normal in arms, increased in legs but no clonus at ankles.        DIAGNOSTIC DATA (LABS, IMAGING, TESTING) - I reviewed patient records, labs, notes, testing and imaging myself where available.  Lab Results  Component Value Date   WBC 4.0 12/12/2019   HGB 13.9 12/12/2019   HCT 41.3 12/12/2019   MCV 93.2 12/12/2019   PLT 157 12/12/2019      Component Value Date/Time   NA 141 12/12/2019 1330   NA 140 08/31/2017 1524   K 3.7 12/12/2019 1330   CL 103 12/12/2019 1330   CO2 30 12/12/2019 1330   GLUCOSE 93 12/12/2019 1330   BUN 13 12/12/2019 1330   BUN 17 08/31/2017 1524   CREATININE 0.68 12/12/2019 1330   CALCIUM 8.9 12/12/2019 1330   PROT 7.3 12/12/2019 1330   PROT 6.4 08/31/2017 1524   ALBUMIN 4.2 12/12/2019 1330   ALBUMIN 4.3 08/31/2017 1524   AST 35 12/12/2019 1330   ALT 44 12/12/2019 1330   ALKPHOS 85 12/12/2019 1330   BILITOT 0.4 12/12/2019 1330   GFRNONAA >60 12/12/2019 1330   GFRAA >60 12/12/2019 1330   No results found for: CHOL, HDL, LDLCALC,  LDLDIRECT, TRIG, CHOLHDL Lab Results  Component Value Date   HGBA1C 5.3 12/07/2017   ________________________________________________  Multiple sclerosis ([place]) - Plan: CBC with Differential/Platelet  High risk medication use - Plan: CBC with Differential/Platelet  Paresthesias  Gait disturbance  Malignant neoplasm of upper-outer quadrant of right breast in female, estrogen receptor positive (HCC)  Neck pain  Other headache syndrome  1.  Continue Tecfidera.   Lymphocyte counts have been borderline and we need to recheck, if several in a row are 0.6 or any more 0.5 and we will  change med's.     2.   Continue tramadol for pain.  I will send in a new prescription. 3.   Stay active and exercise as tolerated. 4.    Left splenius capitus trigger point injection with 80 mg depo-medrol in 3 cc Marcaine using sterile technique.    Pain improved after injection. 5.   rtc in 6 months or sooner if new or worsening issues. Sundeep Destin A. Felecia Shelling, MD, [facility] [id number], 0000000 PM Certified in Neurology, Clinical Neurophysiology, Sleep Medicine, Pain Medicine and Neuroimaging  [facility] [address] [place], [place] [address] [phone]

## 2020-03-07 LAB — CBC WITH DIFFERENTIAL/PLATELET
Basophils Absolute: 0 10*3/uL (ref 0.0–0.2)
Basos: 1 %
EOS (ABSOLUTE): 0.1 10*3/uL (ref 0.0–0.4)
Eos: 2 %
Hematocrit: 42.7 % (ref 34.0–46.6)
Hemoglobin: 14.4 g/dL (ref 11.1–15.9)
Immature Grans (Abs): 0 10*3/uL (ref 0.0–0.1)
Immature Granulocytes: 0 %
Lymphocytes Absolute: 0.8 10*3/uL (ref 0.7–3.1)
Lymphs: 23 %
MCH: 32.4 pg (ref 26.6–33.0)
MCHC: 33.7 g/dL (ref 31.5–35.7)
MCV: 96 fL (ref 79–97)
Monocytes Absolute: 0.5 10*3/uL (ref 0.1–0.9)
Monocytes: 13 %
Neutrophils Absolute: 2.1 10*3/uL (ref 1.4–7.0)
Neutrophils: 61 %
Platelets: 240 10*3/uL (ref 150–450)
RBC: 4.45 x10E6/uL (ref 3.77–5.28)
RDW: 13.1 % (ref 11.7–15.4)
WBC: 3.5 10*3/uL (ref 3.4–10.8)

## 2020-03-12 ENCOUNTER — Encounter: Payer: Self-pay | Admitting: Rehabilitation

## 2020-03-12 ENCOUNTER — Ambulatory Visit: Payer: Medicare HMO | Admitting: Rehabilitation

## 2020-03-12 ENCOUNTER — Other Ambulatory Visit: Payer: Self-pay

## 2020-03-12 ENCOUNTER — Other Ambulatory Visit: Payer: Self-pay | Admitting: Internal Medicine

## 2020-03-12 ENCOUNTER — Ambulatory Visit: Payer: Medicare HMO | Admitting: Neurology

## 2020-03-12 DIAGNOSIS — M25612 Stiffness of left shoulder, not elsewhere classified: Secondary | ICD-10-CM

## 2020-03-12 DIAGNOSIS — C50411 Malignant neoplasm of upper-outer quadrant of right female breast: Secondary | ICD-10-CM | POA: Diagnosis not present

## 2020-03-12 DIAGNOSIS — M25611 Stiffness of right shoulder, not elsewhere classified: Secondary | ICD-10-CM | POA: Diagnosis not present

## 2020-03-12 DIAGNOSIS — M25512 Pain in left shoulder: Secondary | ICD-10-CM | POA: Diagnosis not present

## 2020-03-12 DIAGNOSIS — R293 Abnormal posture: Secondary | ICD-10-CM | POA: Diagnosis not present

## 2020-03-12 DIAGNOSIS — M6281 Muscle weakness (generalized): Secondary | ICD-10-CM | POA: Diagnosis not present

## 2020-03-12 DIAGNOSIS — Z170 Estrogen receptor positive status [ER+]: Secondary | ICD-10-CM

## 2020-03-12 NOTE — Progress Notes (Signed)
Things That May Be Affecting Your Health:  Alcohol  Hearing loss x Pain    Depression  Home Safety  Sexual Health   Diabetes  Lack of physical activity x Stress   Difficulty with daily activities  Loneliness  Tiredness   Drug use x Medicines  Tobacco use   Falls  Motor Vehicle Safety  Weight   Food choices  Oral Health  Other    YOUR PERSONALIZED HEALTH PLAN : 1. Schedule your next subsequent Medicare Wellness visit in one year 2. Attend all of your regular appointments to address your medical issues 3. Complete the preventative screenings and services   Annual Wellness Visit   Medicare Covered Preventative Screenings and [place] Men and Women Who How Often Need? Date of Last Service Action  Abdominal Aortic Aneurysm Adults with AAA risk factors Once     Alcohol Misuse and Counseling All Adults Screening once a year if no alcohol misuse. Counseling up to 4 face to face sessions.     Bone Density Measurement  Adults at risk for osteoporosis Once every 2 yrs     Lipid Panel Z13.6 All adults without CV disease Once every 5 yrs     Colorectal Cancer   Stool sample or  Colonoscopy All adults 31 and older   Once every year  Every 10 years Possibly: Ask if this is up to date through [place] Once a year  Today   Diabetes Screening Blood glucose, post glucose load, or GTT Z13.1  All adults at risk  Pre-diabetics  Once per year  Twice per year     Diabetes  Self-Management Training All adults Diabetics 10 hrs first year; 2 hours subsequent years. Requires Copay     Glaucoma  Diabetics  Family history of glaucoma  African Americans 105 yrs +  Hispanic Americans 33 yrs + Annually - requires coppay     Hepatitis C Z72.89 or F19.20  High Risk for HCV  Born between 1945 and 1965  Annually  Once     HIV Z11.4 All adults based on risk  Annually btw ages 42 & 51 regardless of risk  Annually > 65 yrs if at increased risk      Lung Cancer Screening Asymptomatic adults aged 43-77 with 30 pack yr history and current smoker OR quit within the last 15 yrs Annually Must have counseling and shared decision making documentation before first screen     Medical Nutrition Therapy Adults with   Diabetes  Renal disease  Kidney transplant within past 3 yrs 3 hours first year; 2 hours subsequent years     Obesity and Counseling All adults Screening once a year Counseling if BMI 30 or higher  Today   Tobacco Use Counseling Adults who use tobacco  Up to 8 visits in one year     Vaccines Z23  Hepatitis B  Influenza   Pneumonia  Adults   Once  Once every flu season  Two different vaccines separated by one year     Next Annual Wellness Visit People with Medicare Every year  Today     Services & Screenings Women Who How Often Need  Date of Last Service Action  Mammogram  Z12.31 Women over 82 One baseline ages 41-39. Annually ager 40 yrs+     Pap tests All women Annually if high risk. Every 2 yrs for normal risk women     Screening for cervical cancer with  Pap (Z01.419 nl or Z01.411abnl) &  HPV Z11.51 Women aged 3 to 32 Once every 5 yrs Possibly: Ask if this is up to date through [place]    Screening pelvic and breast exams All women Annually if high risk. Every 2 yrs for normal risk women     Sexually Transmitted Diseases  Chlamydia  Gonorrhea  Syphilis All at risk adults Annually for non pregnant females at increased risk         [place] Men Who How Ofter Need  Date of Last Service Action  Prostate Cancer - DRE & PSA Men over 50 Annually.  DRE might require a copay.     Sexually Transmitted Diseases  Syphilis All at risk adults Annually for men at increased risk

## 2020-03-12 NOTE — Therapy (Signed)
[place], [place], [address] Phone: [phone]   Fax:  [phone]  Physical Therapy Treatment  Patient Details  Name: Debbie Watts MRN: [id number] Date of Birth: [birth year] Referring Provider (PT): Dr. Erlinda Hong   Encounter Date: 03/12/2020  PT End of Session - 03/12/20 1643    Visit Number  2    Number of Visits  9    Date for PT Re-Evaluation  04/14/20    Authorization - Visit Number  2    Authorization - Number of Visits  6   until 04/14/20   PT Start Time  1601    PT Stop Time  1648    PT Time Calculation (min)  47 min    Activity Tolerance  Patient tolerated treatment well    Behavior During Therapy  [facility] for tasks assessed/performed       Past Medical History:  Diagnosis Date  . Arthritis   . Asthma   . Cancer ([place])    chemo for breast ca  x69mo  . Depression   . Deviated nasal septum   . Hearing loss 06/11/2014   Bilateral  . Herniated cervical disc   . Hypertension   . Migraine   . Miscarriage    2  . MS (multiple sclerosis) ([place]     Past Surgical History:  Procedure Laterality Date  . BREAST BIOPSY Right 11/21/2018   Malignant  . HEMATOMA EVACUATION Right 05/01/2019   Procedure: EVACUATION HEMATOMA;  Surgeon: WRolm Bookbinder MD;  [facility]  Service: General;  Laterality: Right;  . MASTECTOMY Right 04/30/2019  . MASTECTOMY W/ SENTINEL NODE BIOPSY Right 04/30/2019   Procedure: RIGHT MASTECTOMY WITH  RIGHT AXILLARY SENTINEL LYMPH NODE BIOPSY INJECT BLUE DYE;  Surgeon: WRolm Bookbinder MD;  [place] [facility]  Service: General;  Laterality: Right;  . NASAL SEPTUM SURGERY    . SIMPLE MASTECTOMY WITH AXILLARY SENTINEL NODE BIOPSY Right 04/30/2019  . spinal injections      There were no vitals filed for this visit.  Subjective Assessment - 03/12/20 1601    Subjective  Its doing good to be honest    Pertinent History  Pt recently seen here for same shoulder pain now  returning with less pain with cortisone shot. Rt mastectomy 04/30/19 with SLNB due to grade 2 IDC, high grade DCIS 0/2 lymph nodes. ER/PR positive, HER2 equivocal and then Rt breast hematoma evacuation on 05/01/19 by Dr. WDonne Hazel  Neo adjuvant chemotherapy TCH Perjeta started 12/12/18 to current. anastrozole x 7 years. Other history includes MS ( currently under control so will not address this episode) , HTN, OA, asthma, bilateral hearing loss    Currently in Pain?  No/denies         [facility] 0001      AROM   Left Shoulder Flexion  140 Degrees   slight pain   Left Shoulder ABduction  127 Degrees   pain; some scaption                   [facility] Adult PT Treatment/Exercise - 03/12/20 0001      Shoulder Exercises: Supine   Protraction  AROM;10 reps    Protraction Limitations  protraction/retraction with minimal cueing needed    Other Supine Exercises  dowel rod flexion 2# weight      Shoulder Exercises: Sidelying   External Rotation  Left;10 reps    External Rotation Limitations  hand cueing by PT  for target and at scapula    ABduction  Left;10 reps    ABduction Limitations  with humeral head depression assistance by PT      Shoulder Exercises: Standing   Row  Both;15 reps    Theraband Level (Shoulder Row)  Level 1 (Yellow)   out of red in clinic but pt encouraged to use the red at hom     Shoulder Exercises: Pulleys   Flexion  2 minutes    Scaption  2 minutes    Scaption Limitations  with cueing to do as much abduction as possible     Other Pulley Exercises  IR standing 10" x 5      Shoulder Exercises: ROM/Strengthening   Other ROM/Strengthening Exercises  wall ladder flexion getting to 22 x 3 and abduction x 3 getting to 23      Shoulder Exercises: Stretch   Other Shoulder Stretches  single arm wall stretch 2x20"       Manual Therapy   Scapular Mobilization  in Rt sidelying scapular mobilization all directions    Passive ROM  In Supine to  tolerance into flexion, abduction, D2 and er                  PT Long Term Goals - 03/03/20 1350      PT LONG TERM GOAL #1   Title  Pt will improve left shoulder AROM to flexion: 150,  Abduction: 150, and ER of 70 to demonstrate improved mobility    Baseline  125/115/30    Time  4    Period  Weeks    Status  New      PT LONG TERM GOAL #2   Title  Pt will demonstrate MMT of the left shoulder at 4/5 or greater    Time  4    Period  Weeks    Status  New      PT LONG TERM GOAL #3   Title  Pt will decrease her Quick DASH score to < 20 indicating a functional improvment of left shoulder    Baseline  47.73 on 11/27/2019; 15.91 - 12/31/19, 25 on 03/03/20    Time  4    Period  Weeks    Status  New      PT LONG TERM GOAL #4   Title  pt will be ind with final HEP    Time  4    Period  Weeks    Status  New            Plan - 03/12/20 1644    Clinical Impression Statement  First session of ROM work for the left frozen shoulder tolerated all well and without pain.  Limitations into all directions but improved since eval session.  Good scapular mobility but need for scapular strengthening continues.    PT Frequency  2x / week    PT Duration  3 weeks    PT Treatment/Interventions  ADLs/Self Care Home Management;Iontophoresis 51m/ml Dexamethasone;Electrical Stimulation;Moist Heat;Therapeutic activities;Therapeutic exercise;Neuromuscular re-education;Patient/family education;Manual techniques;Manual lymph drainage;Passive range of motion;Taping;Joint Manipulations    PT Next Visit Plan  Left shoulder PROM, AAROM, scapular and postural exercises    Consulted and Agree with Plan of Care  Patient       Patient will benefit from skilled therapeutic intervention in order to improve the following deficits and impairments:     Visit Diagnosis: Stiffness of left shoulder joint  Muscle weakness (generalized)  Abnormal posture  Acute pain of left  shoulder  Stiffness of right  shoulder, not elsewhere classified  Malignant neoplasm of upper-outer quadrant of right breast in female, estrogen receptor positive [facility])     Problem List Patient Active Problem List   Diagnosis Date Noted  . High risk medication use 03/06/2020  . Chronic left shoulder pain 02/14/2020  . Shingles 12/24/2019  . Breast cancer, right ([place]) 04/30/2019  . Port-A-Cath in place 02/13/2019  . Syncope 01/19/2019  . Nausea vomiting and diarrhea 01/19/2019  . Leukocytosis 01/19/2019  . Malignant neoplasm of upper-outer quadrant of right breast in female, estrogen receptor positive ([place]) 11/27/2018  . Neurogenic claudication due to lumbar spinal stenosis 09/19/2018  . Urinary tract infection without hematuria 08/10/2018  . Chronic left-sided thoracic back pain 07/20/2018  . Tendon nodule 04/21/2018  . Solar lentigo 04/21/2018  . Preventative health care 04/20/2018  . Acute cystitis without hematuria 03/16/2018  . Gait disturbance 01/12/2018  . Polyuria 12/08/2017  . Easy bruising 08/31/2017  . Chronic obstructive pulmonary disease ([place]) 11/30/2016  . Essential hypertension 01/26/2016  . Migraine headache 06/23/2014  . Multiple sclerosis ([place]) 06/23/2014  . Paresthesias 06/23/2014  . Restless leg syndrome 06/23/2014  . Hearing loss, bilateral 06/11/2014  . Major depression, recurrent, chronic ([place]) 06/11/2014  . Cervical herniated disc 06/11/2014  . Cigarette nicotine dependence without complication 80/16/5537  . Lumbar herniated disc 06/11/2014  . Perimenopausal symptoms 06/11/2014    Stark Bray 03/12/2020, 4:57 PM  [place], [place], [address] Phone: [phone]   Fax:  [phone]  Name: Kendalyn Cranfield MRN: [id number] Date of Birth: November 03, 1960

## 2020-03-17 ENCOUNTER — Ambulatory Visit: Payer: Medicare HMO

## 2020-03-17 ENCOUNTER — Other Ambulatory Visit: Payer: Self-pay

## 2020-03-17 DIAGNOSIS — M6281 Muscle weakness (generalized): Secondary | ICD-10-CM | POA: Diagnosis not present

## 2020-03-17 DIAGNOSIS — C50411 Malignant neoplasm of upper-outer quadrant of right female breast: Secondary | ICD-10-CM

## 2020-03-17 DIAGNOSIS — M25611 Stiffness of right shoulder, not elsewhere classified: Secondary | ICD-10-CM | POA: Diagnosis not present

## 2020-03-17 DIAGNOSIS — R293 Abnormal posture: Secondary | ICD-10-CM

## 2020-03-17 DIAGNOSIS — M25612 Stiffness of left shoulder, not elsewhere classified: Secondary | ICD-10-CM | POA: Diagnosis not present

## 2020-03-17 DIAGNOSIS — M25512 Pain in left shoulder: Secondary | ICD-10-CM

## 2020-03-17 DIAGNOSIS — Z170 Estrogen receptor positive status [ER+]: Secondary | ICD-10-CM | POA: Diagnosis not present

## 2020-03-17 NOTE — Therapy (Signed)
[place], [place], [address] Phone: [phone]   Fax:  [phone]  Physical Therapy Treatment  Patient Details  Name: Debbie Watts MRN: [id number] Date of Birth: 16-Apr-1961 Referring Provider (PT): Dr. Erlinda Hong   Encounter Date: 03/17/2020  PT End of Session - 03/17/20 1514    Visit Number  3    Number of Visits  9    Date for PT Re-Evaluation  04/14/20    Authorization Type  [employer]    Authorization - Visit Number  3    Authorization - Number of Visits  6    PT Start Time  9169    PT Stop Time  1558    PT Time Calculation (min)  46 min    Activity Tolerance  Patient tolerated treatment well    Behavior During Therapy  [facility] for tasks assessed/performed       Past Medical History:  Diagnosis Date  . Arthritis   . Asthma   . Cancer ([place])    chemo for breast ca  x89mo  . Depression   . Deviated nasal septum   . Hearing loss 06/11/2014   Bilateral  . Herniated cervical disc   . Hypertension   . Migraine   . Miscarriage    2  . MS (multiple sclerosis) ([place]     Past Surgical History:  Procedure Laterality Date  . BREAST BIOPSY Right 11/21/2018   Malignant  . HEMATOMA EVACUATION Right 05/01/2019   Procedure: EVACUATION HEMATOMA;  Surgeon: WRolm Bookbinder MD;  [facility]  Service: General;  Laterality: Right;  . MASTECTOMY Right 04/30/2019  . MASTECTOMY W/ SENTINEL NODE BIOPSY Right 04/30/2019   Procedure: RIGHT MASTECTOMY WITH  RIGHT AXILLARY SENTINEL LYMPH NODE BIOPSY INJECT BLUE DYE;  Surgeon: WRolm Bookbinder MD;  [place]  Service: General;  Laterality: Right;  . NASAL SEPTUM SURGERY    . SIMPLE MASTECTOMY WITH AXILLARY SENTINEL NODE BIOPSY Right 04/30/2019  . spinal injections      There were no vitals filed for this visit.  Subjective Assessment - 03/17/20 1515    Subjective  Pt states that she has been stretching in the shower to help wash her back. She  reports that her movement is getting better.    Pertinent History  Pt recently seen here for same shoulder pain now returning with less pain with cortisone shot. Rt mastectomy 04/30/19 with SLNB due to grade 2 IDC, high grade DCIS 0/2 lymph nodes. ER/PR positive, HER2 equivocal and then Rt breast hematoma evacuation on 05/01/19 by Dr. WDonne Hazel  Neo adjuvant chemotherapy TCH Perjeta started 12/12/18 to current. anastrozole x 7 years. Other history includes MS ( currently under control so will not address this episode) , HTN, OA, asthma, bilateral hearing loss    Patient Stated Goals  more motion    Currently in Pain?  Yes    Pain Score  1    pt states pain can get up to a 10   Pain Location  Shoulder    Pain Orientation  Left    Pain Descriptors / Indicators  Shooting    Pain Type  Chronic pain    Pain Radiating Towards  down into her hand.    Pain Onset  More than a month ago    Pain Frequency  Intermittent    Aggravating Factors   reaching    Pain Relieving Factors  moist heat    Effect of Pain on Daily  Activities  difficulty with house hold chores and self care                        [facility] Adult PT Treatment/Exercise - 03/17/20 0001      Shoulder Exercises: Supine   Other Supine Exercises  Dowel Flexion 20x with end range stretch paying attention to pain/symptoms with pain at the insertion of the supraspinatus and down the triceps. Repeated at end of session with decreased reports of pain just stretching in the L axilla 10x     Other Supine Exercises  Ulnar nerve flossing 10x with tactile cueing and demonstration for correct movement just up until she begins to feel slight increase in symptoms then back off.       Manual Therapy   Manual Therapy  Soft tissue mobilization;Joint mobilization    Joint Mobilization  Posterior clavicle head on acromion grade III 8x8 and humeral head posteriorly 8x8 grade III, Inferior humeral head glide with abduction and anterior humeral head  glide with External rotation 10x in side-lying w/improved ROM with each repetition.     Soft tissue mobilization  to the L triceps, biceps, supinator, middle deltoid, supraspinatus with significant tenderness/Tp Noted; improved moderately following light to moderate pressure STM and TpR.              PT Education - 03/17/20 1558    Education Details  Pt will continue to work on her current HEP at this time. Discussed the importance of moving into a stretch and performing ROM in order to decrease stiffness. Pt was educated that she may be sore after STM today but to keep moving and report if she does have increased sorenss.    Person(Debbie) Educated  Patient    Methods  Explanation    Comprehension  Verbalized understanding          PT Long Term Goals - 03/03/20 1350      PT LONG TERM GOAL #1   Title  Pt will improve left shoulder AROM to flexion: 150,  Abduction: 150, and ER of 70 to demonstrate improved mobility    Baseline  125/115/30    Time  4    Period  Weeks    Status  New      PT LONG TERM GOAL #2   Title  Pt will demonstrate MMT of the left shoulder at 4/5 or greater    Time  4    Period  Weeks    Status  New      PT LONG TERM GOAL #3   Title  Pt will decrease her Quick DASH score to < 20 indicating a functional improvment of left shoulder    Baseline  47.73 on 11/27/2019; 15.91 - 12/31/19, 25 on 03/03/20    Time  4    Period  Weeks    Status  New      PT LONG TERM GOAL #4   Title  pt will be ind with final HEP    Time  4    Period  Weeks    Status  New            Plan - 03/17/20 1514    Clinical Impression Statement  Pt presents with pain in her posterior brachium and at the insertion of her supraspinatus at the beginning of her physcial therapy session. On palpation significant tightness/tenderness and Tp were noted throughout the L biceps, triceps, supinator, deltoid and supraspinatus; decreased moderately following  light to moderate pressure STM and TpR.  Posterior mobs and glides of the humeral head and posterior mobs of the L clavicle on the acromion in order to decrease joint stiffness and improve ROM during A/ROM with good results. Ulnar nerve glide perfomred passively and then taught to patient following reports of pain that follows dermatomal pattern of ulnar nerve decreasing following 10 repetitions. Pt was able to move into greater ROM w/decreased pain by end of session. Pt will benefit from continued POC at this time.    Personal Factors and Comorbidities  Comorbidity 2    Comorbidities  MS, previous chemo    Examination-Activity Limitations  Reach Overhead;Lift;Hygiene/Grooming;Dressing;Bathing;Toileting    Rehab Potential  Good    PT Frequency  2x / week    PT Duration  3 weeks    PT Treatment/Interventions  ADLs/Self Care Home Management;Iontophoresis 61m/ml Dexamethasone;Electrical Stimulation;Moist Heat;Therapeutic activities;Therapeutic exercise;Neuromuscular re-education;Patient/family education;Manual techniques;Manual lymph drainage;Passive range of motion;Taping;Joint Manipulations    PT Next Visit Plan  Assess STM, Mobs. Left shoulder PROM, AAROM, scapular and postural exercises    PT Home Exercise Plan  currently has rockwood exercises, wall walking, towel up the back stretch, and information on ordering pulleys    Consulted and Agree with Plan of Care  Patient       Patient will benefit from skilled therapeutic intervention in order to improve the following deficits and impairments:  Decreased range of motion, Decreased strength, Increased fascial restricitons, Impaired UE functional use, Postural dysfunction, Impaired perceived functional ability, Increased muscle spasms, Pain  Visit Diagnosis: Stiffness of left shoulder joint  Muscle weakness (generalized)  Abnormal posture  Acute pain of left shoulder  Stiffness of right shoulder, not elsewhere classified  Malignant neoplasm of upper-outer quadrant of right breast  in female, estrogen receptor positive ([place]     Problem List Patient Active Problem List   Diagnosis Date Noted  . High risk medication use 03/06/2020  . Chronic left shoulder pain 02/14/2020  . Shingles 12/24/2019  . Breast cancer, right ([place] 04/30/2019  . Port-A-Cath in place 02/13/2019  . Syncope 01/19/2019  . Nausea vomiting and diarrhea 01/19/2019  . Leukocytosis 01/19/2019  . Malignant neoplasm of upper-outer quadrant of right breast in female, estrogen receptor positive ([place] 11/27/2018  . Neurogenic claudication due to lumbar spinal stenosis 09/19/2018  . Urinary tract infection without hematuria 08/10/2018  . Chronic left-sided thoracic back pain 07/20/2018  . Tendon nodule 04/21/2018  . Solar lentigo 04/21/2018  . Preventative health care 04/20/2018  . Acute cystitis without hematuria 03/16/2018  . Gait disturbance 01/12/2018  . Polyuria 12/08/2017  . Easy bruising 08/31/2017  . Chronic obstructive pulmonary disease ([place] 11/30/2016  . Essential hypertension 01/26/2016  . Migraine headache 06/23/2014  . Multiple sclerosis ([place] 06/23/2014  . Paresthesias 06/23/2014  . Restless leg syndrome 06/23/2014  . Hearing loss, bilateral 06/11/2014  . Major depression, recurrent, chronic ([place] 06/11/2014  . Cervical herniated disc 06/11/2014  . Cigarette nicotine dependence without complication 097/11/6376 . Lumbar herniated disc 06/11/2014  . Perimenopausal symptoms 06/11/2014    CAnder Purpura PT 03/17/2020, 4:03 PM  [place] [address]Phone: [phone]  Fax:  [phone] Name: SMaxyne DerocherMRN: [id number] of Birth: [birth year]

## 2020-03-18 ENCOUNTER — Other Ambulatory Visit: Payer: Self-pay | Admitting: Internal Medicine

## 2020-03-19 ENCOUNTER — Other Ambulatory Visit: Payer: Self-pay

## 2020-03-19 ENCOUNTER — Ambulatory Visit: Payer: Medicare HMO

## 2020-03-19 DIAGNOSIS — Z170 Estrogen receptor positive status [ER+]: Secondary | ICD-10-CM

## 2020-03-19 DIAGNOSIS — M25612 Stiffness of left shoulder, not elsewhere classified: Secondary | ICD-10-CM | POA: Diagnosis not present

## 2020-03-19 DIAGNOSIS — R293 Abnormal posture: Secondary | ICD-10-CM | POA: Diagnosis not present

## 2020-03-19 DIAGNOSIS — M25512 Pain in left shoulder: Secondary | ICD-10-CM

## 2020-03-19 DIAGNOSIS — M6281 Muscle weakness (generalized): Secondary | ICD-10-CM | POA: Diagnosis not present

## 2020-03-19 DIAGNOSIS — M25611 Stiffness of right shoulder, not elsewhere classified: Secondary | ICD-10-CM | POA: Diagnosis not present

## 2020-03-19 DIAGNOSIS — C50411 Malignant neoplasm of upper-outer quadrant of right female breast: Secondary | ICD-10-CM | POA: Diagnosis not present

## 2020-03-19 NOTE — Therapy (Signed)
[place], [place], [address] Phone: [phone]   Fax:  [phone]  Physical Therapy Treatment  Patient Details  Name: Debbie Watts MRN: [id number] Date of Birth: [birth year] Referring Provider (PT): Dr. Erlinda Hong   Encounter Date: 03/19/2020  PT End of Session - 03/19/20 1414    Visit Number  4    Number of Visits  9    Date for PT Re-Evaluation  04/14/20    Authorization Type  [employer]    Authorization - Visit Number  3    Authorization - Number of Visits  6    PT Start Time  1410    PT Stop Time  1503    PT Time Calculation (min)  53 min    Activity Tolerance  Patient tolerated treatment well    Behavior During Therapy  [facility] for tasks assessed/performed       Past Medical History:  Diagnosis Date  . Arthritis   . Asthma   . Cancer ([place])    chemo for breast ca  x73mo  . Depression   . Deviated nasal septum   . Hearing loss 06/11/2014   Bilateral  . Herniated cervical disc   . Hypertension   . Migraine   . Miscarriage    2  . MS (multiple sclerosis) ([place]     Past Surgical History:  Procedure Laterality Date  . BREAST BIOPSY Right 11/21/2018   Malignant  . HEMATOMA EVACUATION Right 05/01/2019   Procedure: EVACUATION HEMATOMA;  Surgeon: WRolm Bookbinder MD;  [place]  Service: General;  Laterality: Right;  . MASTECTOMY Right 04/30/2019  . MASTECTOMY W/ SENTINEL NODE BIOPSY Right 04/30/2019   Procedure: RIGHT MASTECTOMY WITH  RIGHT AXILLARY SENTINEL LYMPH NODE BIOPSY INJECT BLUE DYE;  Surgeon: WRolm Bookbinder MD;  [facility] [facility]  Service: General;  Laterality: Right;  . NASAL SEPTUM SURGERY    . SIMPLE MASTECTOMY WITH AXILLARY SENTINEL NODE BIOPSY Right 04/30/2019  . spinal injections      There were no vitals filed for this visit.  Subjective Assessment - 03/19/20 1412    Subjective  Pt states that she felt a little bruised yesterday but is feeling better today after  her last session.    Pertinent History  Pt recently seen here for same shoulder pain now returning with less pain with cortisone shot. Rt mastectomy 04/30/19 with SLNB due to grade 2 IDC, high grade DCIS 0/2 lymph nodes. ER/PR positive, HER2 equivocal and then Rt breast hematoma evacuation on 05/01/19 by Dr. WDonne Hazel  Neo adjuvant chemotherapy TCH Perjeta started 12/12/18 to current. anastrozole x 7 years. Other history includes MS ( currently under control so will not address this episode) , HTN, OA, asthma, bilateral hearing loss    Patient Stated Goals  more motion    Currently in Pain?  Yes    Pain Score  3     Pain Location  Shoulder    Pain Orientation  Left    Pain Descriptors / Indicators  Aching    Pain Type  Chronic pain    Pain Onset  More than a month ago    Pain Frequency  Intermittent                        [facility] Adult PT Treatment/Exercise - 03/19/20 0001      Shoulder Exercises: Supine   Other Supine Exercises  Supine External rotation/abduction with hands behind head  with elbow squeeze then scapular retraction and pressing toward plinth table 10x with VC for correct movement.     Other Supine Exercises  Supine internal rotation and external rotation isometric hold 6x 10 seconds with P/ROM into external rotation wtih improvement in pain-free range.       Shoulder Exercises: Prone   Extension  AROM;Left;10 reps    Extension Limitations  prone w/VC for scapular squeeze at end range for improved scapulohumeral movement.     Horizontal ABduction 1  Strengthening;Left;10 reps    Horizontal ABduction 1 Limitations  limited ROM, VC for scapular squeeze at end range .Pt is very tight in the anterior chest which limits ROM    Horizontal ABduction 2  Strengthening;Left;10 reps    Horizontal ABduction 2 Limitations  with elbow bent VC for scapular retraction at end range working on scapulohumeral rhythm, Very limited ROM due to capsular stiffness.       Shoulder  Exercises: Sidelying   External Rotation  Left;10 reps    External Rotation Limitations  tactile cueing at the elbow to keep it close to the body and VC to squeeze scapula with movement up.       Manual Therapy   Manual Therapy  Soft tissue mobilization;Joint mobilization    Joint Mobilization  Grade III posteroirly and inferiorly on the humeral head with abduction and flexion to decrease reports of pain at the shoulder.     Soft tissue mobilization  soft tissue mobilization at the L triceps and pectoralis major/minor    Passive ROM  In supine with myofascial release into flexion and abduction along the lateral and anterior trunk multiple repetitions.              PT Education - 03/19/20 1716    Education Details  Pt will continue to work on her current HEP at this time. She was provided with demonstration and return demonstration of neural flossing for median, radial and ulnar nerve. Discussed performing up until she feels very slight beginning of symptoms and then backing off.    Person(s) Educated  Patient    Methods  Explanation;Demonstration;Verbal cues;Handout    Comprehension  Returned demonstration;Verbalized understanding          PT Long Term Goals - 03/03/20 1350      PT LONG TERM GOAL #1   Title  Pt will improve left shoulder AROM to flexion: 150,  Abduction: 150, and ER of 70 to demonstrate improved mobility    Baseline  125/115/30    Time  4    Period  Weeks    Status  New      PT LONG TERM GOAL #2   Title  Pt will demonstrate MMT of the left shoulder at 4/5 or greater    Time  4    Period  Weeks    Status  New      PT LONG TERM GOAL #3   Title  Pt will decrease her Quick DASH score to < 20 indicating a functional improvment of left shoulder    Baseline  47.73 on 11/27/2019; 15.91 - 12/31/19, 25 on 03/03/20    Time  4    Period  Weeks    Status  New      PT LONG TERM GOAL #4   Title  pt will be ind with final HEP    Time  4    Period  Weeks    Status   New  Plan - 03/19/20 1414    Clinical Impression Statement  Pt presents with continues stiffness in the L triceps and pectoralis major/minor; minimal improvement following STM. She was provided with neural flossing for all major nerves in the LUE due to pt reports different areas effected with different movements; pt had good form and stated understanding to not aggravate symptoms. Mobs performed due to stiffness in the L glenohumeral joint and contract-relax stretching was performed using both antagonistic and agonist muscles in the L shoulder for external rotation with Passive stretching demonstrating good results into further ROM w/o significant increase in pain/radiating symptoms. Pt will benefit from continued POC at this time.    Personal Factors and Comorbidities  Comorbidity 2    Comorbidities  MS, previous chemo    Examination-Activity Limitations  Reach Overhead;Lift;Hygiene/Grooming;Dressing;Bathing;Toileting    Rehab Potential  Good    PT Frequency  2x / week    PT Duration  3 weeks    PT Treatment/Interventions  ADLs/Self Care Home Management;Iontophoresis 34m/ml Dexamethasone;Electrical Stimulation;Moist Heat;Therapeutic activities;Therapeutic exercise;Neuromuscular re-education;Patient/family education;Manual techniques;Manual lymph drainage;Passive range of motion;Taping;Joint Manipulations    PT Next Visit Plan  Assess STM, Mobs. Left shoulder PROM, AAROM, scapular and postural exercises    PT Home Exercise Plan  currently has rockwood exercises, wall walking, towel up the back stretch, and information on ordering pulleys    Consulted and Agree with Plan of Care  Patient       Patient will benefit from skilled therapeutic intervention in order to improve the following deficits and impairments:  Decreased range of motion, Decreased strength, Increased fascial restricitons, Impaired UE functional use, Postural dysfunction, Impaired perceived functional ability,  Increased muscle spasms, Pain  Visit Diagnosis: Stiffness of left shoulder joint  Muscle weakness (generalized)  Abnormal posture  Acute pain of left shoulder  Stiffness of right shoulder, not elsewhere classified  Malignant neoplasm of upper-outer quadrant of right breast in female, estrogen receptor positive ([place]     Problem List Patient Active Problem List   Diagnosis Date Noted  . High risk medication use 03/06/2020  . Chronic left shoulder pain 02/14/2020  . Shingles 12/24/2019  . Breast cancer, right ([place] 04/30/2019  . Port-A-Cath in place 02/13/2019  . Syncope 01/19/2019  . Nausea vomiting and diarrhea 01/19/2019  . Leukocytosis 01/19/2019  . Malignant neoplasm of upper-outer quadrant of right breast in female, estrogen receptor positive ([place] 11/27/2018  . Neurogenic claudication due to lumbar spinal stenosis 09/19/2018  . Urinary tract infection without hematuria 08/10/2018  . Chronic left-sided thoracic back pain 07/20/2018  . Tendon nodule 04/21/2018  . Solar lentigo 04/21/2018  . Preventative health care 04/20/2018  . Acute cystitis without hematuria 03/16/2018  . Gait disturbance 01/12/2018  . Polyuria 12/08/2017  . Easy bruising 08/31/2017  . Chronic obstructive pulmonary disease ([place] 11/30/2016  . Essential hypertension 01/26/2016  . Migraine headache 06/23/2014  . Multiple sclerosis ([place] 06/23/2014  . Paresthesias 06/23/2014  . Restless leg syndrome 06/23/2014  . Hearing loss, bilateral 06/11/2014  . Major depression, recurrent, chronic ([place] 06/11/2014  . Cervical herniated disc 06/11/2014  . Cigarette nicotine dependence without complication 016/07/9603 . Lumbar herniated disc 06/11/2014  . Perimenopausal symptoms 06/11/2014    CAnder Purpura PT 03/19/2020, 5:20 PM  [place] [address]Phone: [phone]  Fax:  [phone] Name: SGertrude BucksMRN: [id number] of Birth: 6Dec 23, 1962

## 2020-03-20 ENCOUNTER — Ambulatory Visit: Payer: Medicare HMO | Admitting: Neurology

## 2020-03-26 ENCOUNTER — Other Ambulatory Visit: Payer: Self-pay

## 2020-03-26 ENCOUNTER — Ambulatory Visit: Payer: Medicare HMO | Attending: Hematology and Oncology

## 2020-03-26 DIAGNOSIS — M25512 Pain in left shoulder: Secondary | ICD-10-CM | POA: Diagnosis not present

## 2020-03-26 DIAGNOSIS — Z170 Estrogen receptor positive status [ER+]: Secondary | ICD-10-CM | POA: Diagnosis not present

## 2020-03-26 DIAGNOSIS — C50411 Malignant neoplasm of upper-outer quadrant of right female breast: Secondary | ICD-10-CM | POA: Diagnosis not present

## 2020-03-26 DIAGNOSIS — R293 Abnormal posture: Secondary | ICD-10-CM

## 2020-03-26 DIAGNOSIS — M25612 Stiffness of left shoulder, not elsewhere classified: Secondary | ICD-10-CM

## 2020-03-26 DIAGNOSIS — M25611 Stiffness of right shoulder, not elsewhere classified: Secondary | ICD-10-CM | POA: Diagnosis not present

## 2020-03-26 DIAGNOSIS — M6281 Muscle weakness (generalized): Secondary | ICD-10-CM

## 2020-03-26 NOTE — Therapy (Signed)
[place], [place], [address] Phone: [phone]   Fax:  [phone]  Physical Therapy Treatment  Patient Details  Name: Debbie Watts MRN: [id number] Date of Birth: 1961/09/06 Referring Provider (PT): Dr. Erlinda Hong   Encounter Date: 03/26/2020  PT End of Session - 03/26/20 1506    Visit Number  5    Date for PT Re-Evaluation  04/14/20    Authorization Type  [employer]    Authorization - Visit Number  4    Authorization - Number of Visits  6    PT Start Time  3762    PT Stop Time  8315    PT Time Calculation (min)  46 min    Activity Tolerance  Patient tolerated treatment well    Behavior During Therapy  [facility] for tasks assessed/performed       Past Medical History:  Diagnosis Date  . Arthritis   . Asthma   . Cancer ([place])    chemo for breast ca  x11mo  . Depression   . Deviated nasal septum   . Hearing loss 06/11/2014   Bilateral  . Herniated cervical disc   . Hypertension   . Migraine   . Miscarriage    2  . MS (multiple sclerosis) ([place]     Past Surgical History:  Procedure Laterality Date  . BREAST BIOPSY Right 11/21/2018   Malignant  . HEMATOMA EVACUATION Right 05/01/2019   Procedure: EVACUATION HEMATOMA;  Surgeon: WRolm Bookbinder MD;  [place] [facility]  Service: General;  Laterality: Right;  . MASTECTOMY Right 04/30/2019  . MASTECTOMY W/ SENTINEL NODE BIOPSY Right 04/30/2019   Procedure: RIGHT MASTECTOMY WITH  RIGHT AXILLARY SENTINEL LYMPH NODE BIOPSY INJECT BLUE DYE;  Surgeon: WRolm Bookbinder MD;  [place] [facility]  Service: General;  Laterality: Right;  . NASAL SEPTUM SURGERY    . SIMPLE MASTECTOMY WITH AXILLARY SENTINEL NODE BIOPSY Right 04/30/2019  . spinal injections      There were no vitals filed for this visit.  Subjective Assessment - 03/26/20 1507    Subjective  Pt states that when she was rolling over she thinks that she may have pulled a muscle or bruised a rib. She  states that her upper trapezius has been tight as well.    Pertinent History  Pt recently seen here for same shoulder pain now returning with less pain with cortisone shot. Rt mastectomy 04/30/19 with SLNB due to grade 2 IDC, high grade DCIS 0/2 lymph nodes. ER/PR positive, HER2 equivocal and then Rt breast hematoma evacuation on 05/01/19 by Dr. WDonne Hazel  Neo adjuvant chemotherapy TCH Perjeta started 12/12/18 to current. anastrozole x 7 years. Other history includes MS ( currently under control so will not address this episode) , HTN, OA, asthma, bilateral hearing loss    Patient Stated Goals  more motion    Currently in Pain?  Yes    Pain Score  5     Pain Location  Shoulder    Pain Orientation  Left    Pain Descriptors / Indicators  Aching    Pain Type  Chronic pain;Acute pain    Pain Radiating Towards  shoulder and one area in the lower ribs    Pain Onset  More than a month ago    Aggravating Factors   reaching    Pain Relieving Factors  moist heat  [place] Adult PT Treatment/Exercise - 03/26/20 0001      Shoulder Exercises: Seated   Other Seated Exercises  Scalene stretch for the L 2x 20 seconds, Upper trapezius stretch 2x 20 seconds, scapular retraction and chin retraction 10x each following STM to help improve blood flow and muscle activity with demonstration and VC for correct movement to get stretch and muscular activation.       Manual Therapy   Manual Therapy  Soft tissue mobilization    Soft tissue mobilization  over cord noted in the anterior chest wall to the mandibular angle from port scar, to the L upper trapezius, middle and posterior scalenes with 1x Trigger point in the posterior scalenes and 1x in upper trapezius, scalenes over the lower ribs; decreased moderately following STM and TpR.              PT Education - 03/26/20 1602    Education Details  Access Code: [email or web address], Pt was educated on easy ROM and stretching of the cervical  spine in order to help improve blood flow and mobility following STM/TpR today.    Person(s) Educated  Patient    Methods  Explanation;Demonstration;Verbal cues;Handout    Comprehension  Verbalized understanding;Returned demonstration          PT Long Term Goals - 03/03/20 1350      PT LONG TERM GOAL #1   Title  Pt will improve left shoulder AROM to flexion: 150,  Abduction: 150, and ER of 70 to demonstrate improved mobility    Baseline  125/115/30    Time  4    Period  Weeks    Status  New      PT LONG TERM GOAL #2   Title  Pt will demonstrate MMT of the left shoulder at 4/5 or greater    Time  4    Period  Weeks    Status  New      PT LONG TERM GOAL #3   Title  Pt will decrease her Quick DASH score to < 20 indicating a functional improvment of left shoulder    Baseline  47.73 on 11/27/2019; 15.91 - 12/31/19, 25 on 03/03/20    Time  4    Period  Weeks    Status  New      PT LONG TERM GOAL #4   Title  pt will be ind with final HEP    Time  4    Period  Weeks    Status  New            Plan - 03/26/20 1506    Clinical Impression Statement  Pt is reporting stiffness at the superior shoulder and at the L serratus anterior muscles today; palpable tightness/tenderness decreased following light STM and TpR. Pt was taken through cervical stretches and easy ROM in order to improve circulation and mobility following STM and TpR. Pt was provided with HEP to continue this at home to decrease stress on the cervical spine most likely from compensation due to previous shoulder stiffness/pain. She reports that she has very little to no radiating symptoms down the arm since neural flossing exercises were added last session. Pt will benefit from continued POC at this time.    Personal Factors and Comorbidities  Comorbidity 2    Comorbidities  MS, previous chemo    Examination-Activity Limitations  Reach Overhead;Lift;Hygiene/Grooming;Dressing;Bathing;Toileting    Rehab Potential  Good     PT Frequency  2x / week    PT  Duration  3 weeks    PT Treatment/Interventions  ADLs/Self Care Home Management;Iontophoresis 63m/ml Dexamethasone;Electrical Stimulation;Moist Heat;Therapeutic activities;Therapeutic exercise;Neuromuscular re-education;Patient/family education;Manual techniques;Manual lymph drainage;Passive range of motion;Taping;Joint Manipulations    PT Next Visit Plan  Assess STM, Mobs. Left shoulder PROM, AAROM, scapular and postural exercises    PT Home Exercise Plan  currently has rockwood exercises, wall walking, towel up the back stretch, and information on ordering pulleys    Consulted and Agree with Plan of Care  Patient       Patient will benefit from skilled therapeutic intervention in order to improve the following deficits and impairments:  Decreased range of motion, Decreased strength, Increased fascial restricitons, Impaired UE functional use, Postural dysfunction, Impaired perceived functional ability, Increased muscle spasms, Pain  Visit Diagnosis: Stiffness of left shoulder joint  Muscle weakness (generalized)  Abnormal posture  Acute pain of left shoulder  Stiffness of right shoulder, not elsewhere classified  Malignant neoplasm of upper-outer quadrant of right breast in female, estrogen receptor positive ([place]     Problem List Patient Active Problem List   Diagnosis Date Noted  . High risk medication use 03/06/2020  . Chronic left shoulder pain 02/14/2020  . Shingles 12/24/2019  . Breast cancer, right ([place] 04/30/2019  . Port-A-Cath in place 02/13/2019  . Syncope 01/19/2019  . Nausea vomiting and diarrhea 01/19/2019  . Leukocytosis 01/19/2019  . Malignant neoplasm of upper-outer quadrant of right breast in female, estrogen receptor positive ([place] 11/27/2018  . Neurogenic claudication due to lumbar spinal stenosis 09/19/2018  . Urinary tract infection without hematuria 08/10/2018  . Chronic left-sided thoracic back pain 07/20/2018  . Tendon  nodule 04/21/2018  . Solar lentigo 04/21/2018  . Preventative health care 04/20/2018  . Acute cystitis without hematuria 03/16/2018  . Gait disturbance 01/12/2018  . Polyuria 12/08/2017  . Easy bruising 08/31/2017  . Chronic obstructive pulmonary disease ([place] 11/30/2016  . Essential hypertension 01/26/2016  . Migraine headache 06/23/2014  . Multiple sclerosis ([place] 06/23/2014  . Paresthesias 06/23/2014  . Restless leg syndrome 06/23/2014  . Hearing loss, bilateral 06/11/2014  . Major depression, recurrent, chronic ([facility] 06/11/2014  . Cervical herniated disc 06/11/2014  . Cigarette nicotine dependence without complication 075/30/0511 . Lumbar herniated disc 06/11/2014  . Perimenopausal symptoms 06/11/2014    CAnder Purpura PT 03/26/2020, 4:07 PM  [place] [address]Phone: [phone]  Fax:  [phone] Name: Debbie BonenfantMRN: [id number] of Birth: 609-Sep-1962

## 2020-03-26 NOTE — Patient Instructions (Signed)
Access Code: [email or web address] URL: [email or web address] Date: 03/26/2020 Prepared by: Tomma Rakers  Exercises Seated Gentle Upper Trapezius Stretch - 3 x daily - 7 x weekly - 1 sets - 2 reps - 20 seconds hold Gentle Levator Scapulae Stretch - 3 x daily - 7 x weekly - 1 sets - 2 reps - 20 seconds hold Seated Scapular Retraction - 3 x daily - 7 x weekly - 1 sets - 10 reps Seated Cervical Retraction - 3 x daily - 7 x weekly - 1 sets - 10 reps

## 2020-03-31 ENCOUNTER — Encounter: Payer: Medicare HMO | Admitting: Rehabilitation

## 2020-04-02 ENCOUNTER — Other Ambulatory Visit: Payer: Self-pay

## 2020-04-02 ENCOUNTER — Ambulatory Visit: Payer: Self-pay

## 2020-04-02 ENCOUNTER — Telehealth: Payer: Self-pay | Admitting: Orthopaedic Surgery

## 2020-04-02 ENCOUNTER — Ambulatory Visit: Payer: Medicare HMO | Admitting: Orthopaedic Surgery

## 2020-04-02 ENCOUNTER — Ambulatory Visit: Payer: Medicare HMO

## 2020-04-02 DIAGNOSIS — M25612 Stiffness of left shoulder, not elsewhere classified: Secondary | ICD-10-CM

## 2020-04-02 DIAGNOSIS — G8929 Other chronic pain: Secondary | ICD-10-CM | POA: Diagnosis not present

## 2020-04-02 DIAGNOSIS — M25512 Pain in left shoulder: Secondary | ICD-10-CM | POA: Diagnosis not present

## 2020-04-02 DIAGNOSIS — R293 Abnormal posture: Secondary | ICD-10-CM | POA: Diagnosis not present

## 2020-04-02 DIAGNOSIS — Z170 Estrogen receptor positive status [ER+]: Secondary | ICD-10-CM

## 2020-04-02 DIAGNOSIS — M6281 Muscle weakness (generalized): Secondary | ICD-10-CM

## 2020-04-02 DIAGNOSIS — M25611 Stiffness of right shoulder, not elsewhere classified: Secondary | ICD-10-CM

## 2020-04-02 DIAGNOSIS — C50411 Malignant neoplasm of upper-outer quadrant of right female breast: Secondary | ICD-10-CM | POA: Diagnosis not present

## 2020-04-02 MED ORDER — TRAMADOL HCL 50 MG PO TABS: 50.0000 mg | ORAL_TABLET | Freq: Every day | ORAL | 0 refills | Status: DC | PRN

## 2020-04-02 NOTE — Telephone Encounter (Signed)
Patient called stating that the Tramadol is not helping her pain and that she is requesting something stronger.  CB#[phone].  Thank you

## 2020-04-02 NOTE — Progress Notes (Signed)
Subjective: Patient is here for ultrasound-guided intra-articular left glenohumeral injection.  She has had significant improvement in her pain and range of motion after the first 1 and was physical therapy.  Objective: Limited mainly with behind the back reach.  Procedure: Ultrasound-guided left glenohumeral injection: After sterile prep with Betadine, injected 8 cc 1% lidocaine without epinephrine and 40 mg methylprednisolone using a 22-gauge spinal needle, passing the needle from posterior approach into the glenohumeral joint.  Injectate was seen filling the joint capsule.  Follow-up as needed.

## 2020-04-02 NOTE — Addendum Note (Signed)
Addended by: Hortencia Pilar on: 04/02/2020 01:25 PM   Modules accepted: Orders

## 2020-04-02 NOTE — Telephone Encounter (Signed)
Pt called stating she had an appt on 04/02/20 and turned down pain medicine when offered but now she would like to have something called in.  [phone]

## 2020-04-02 NOTE — Progress Notes (Signed)
Office Visit Note   Patient: Debbie Watts           Date of Birth: [birth year]           MRN: [id number] Visit Date: 04/02/2020              Requested by: Neva Seat, MD [address] [place],  [place] [address] PCP: Neva Seat, MD   Assessment & Plan: Visit Diagnoses:  1. Chronic left shoulder pain     Plan: Impression is a significantly improved her left shoulder adhesive capsulitis.  She would like to try another injection today since it really helped.  She will continue with PT and home exercises.  Follow-up as needed.  Follow-Up Instructions: Return if symptoms worsen or fail to improve.   Orders:  No orders of the defined types were placed in this encounter.  No orders of the defined types were placed in this encounter.     Procedures: No procedures performed   Clinical Data: No additional findings.   Subjective: Chief Complaint  Patient presents with  . Left Shoulder - Follow-up, Pain    Debbie Watts returns today for her shoulder adhesive capsulitis.  She is doing a lot better since she had the injection and physical therapy.  Very happy overall.   Review of Systems   Objective: Vital Signs: There were no vitals taken for this visit.  Physical Exam  Ortho Exam Left shoulder shows forward flexion that is about 10 to 15 degrees less than the normal shoulder.  External rotation is symmetric.  Internal rotation is limited to the belt line. Specialty Comments:  No specialty comments available.  Imaging: No results found.   PMFS History: Patient Active Problem List   Diagnosis Date Noted  . High risk medication use 03/06/2020  . Chronic left shoulder pain 02/14/2020  . Shingles 12/24/2019  . Breast cancer, right ([place]) 04/30/2019  . Port-A-Cath in place 02/13/2019  . Syncope 01/19/2019  . Nausea vomiting and diarrhea 01/19/2019  . Leukocytosis 01/19/2019  . Malignant neoplasm of upper-outer quadrant of right breast in female, estrogen  receptor positive ([place]) 11/27/2018  . Neurogenic claudication due to lumbar spinal stenosis 09/19/2018  . Urinary tract infection without hematuria 08/10/2018  . Chronic left-sided thoracic back pain 07/20/2018  . Tendon nodule 04/21/2018  . Solar lentigo 04/21/2018  . Preventative health care 04/20/2018  . Acute cystitis without hematuria 03/16/2018  . Gait disturbance 01/12/2018  . Polyuria 12/08/2017  . Easy bruising 08/31/2017  . Chronic obstructive pulmonary disease ([place]) 11/30/2016  . Essential hypertension 01/26/2016  . Migraine headache 06/23/2014  . Multiple sclerosis ([place]) 06/23/2014  . Paresthesias 06/23/2014  . Restless leg syndrome 06/23/2014  . Hearing loss, bilateral 06/11/2014  . Major depression, recurrent, chronic ([place]) 06/11/2014  . Cervical herniated disc 06/11/2014  . Cigarette nicotine dependence without complication 44/12/4740  . Lumbar herniated disc 06/11/2014  . Perimenopausal symptoms 06/11/2014   Past Medical History:  Diagnosis Date  . Arthritis   . Asthma   . Cancer ([place])    chemo for breast ca  x80mos  . Depression   . Deviated nasal septum   . Hearing loss 06/11/2014   Bilateral  . Herniated cervical disc   . Hypertension   . Migraine   . Miscarriage    2  . MS (multiple sclerosis) ([place])     Family History  Adopted: Yes  Problem Relation Age of Onset  . Cancer Maternal Uncle  throat    Past Surgical History:  Procedure Laterality Date  . BREAST BIOPSY Right 11/21/2018   Malignant  . HEMATOMA EVACUATION Right 05/01/2019   Procedure: EVACUATION HEMATOMA;  Surgeon: Rolm Bookbinder, MD;  Location: [facility];  Service: General;  Laterality: Right;  . MASTECTOMY Right 04/30/2019  . MASTECTOMY W/ SENTINEL NODE BIOPSY Right 04/30/2019   Procedure: RIGHT MASTECTOMY WITH  RIGHT AXILLARY SENTINEL LYMPH NODE BIOPSY INJECT BLUE DYE;  Surgeon: Rolm Bookbinder, MD;  Location: [facility];  Service: General;  Laterality: Right;  . NASAL SEPTUM  SURGERY    . SIMPLE MASTECTOMY WITH AXILLARY SENTINEL NODE BIOPSY Right 04/30/2019  . spinal injections     Social History   Occupational History  . Occupation: Disabled  Tobacco Use  . Smoking status: Current Every Day Smoker    Packs/day: 1.00    Years: 44.00    Pack years: 44.00    Types: Cigarettes  . Smokeless tobacco: Never Used  . Tobacco comment: 1 pk per day  Substance and Sexual Activity  . Alcohol use: Yes    Comment: 0-5 beers daily  . Drug use: Yes    Types: Marijuana  . Sexual activity: Not Currently    Partners: Male

## 2020-04-02 NOTE — Therapy (Signed)
[place], [place], [address] Phone: [phone]   Fax:  [phone]  Physical Therapy Treatment  Patient Details  Name: Debbie Watts MRN: [id number] Date of Birth: 1961/04/06 Referring Provider (PT): Dr. Erlinda Hong   Encounter Date: 04/02/2020  PT End of Session - 04/02/20 1602    Visit Number  6    Number of Visits  9    Date for PT Re-Evaluation  04/14/20    Authorization Type  [employer]    Authorization - Visit Number  5    Authorization - Number of Visits  6    PT Start Time  0737    PT Stop Time  1550    PT Time Calculation (min)  44 min    Activity Tolerance  Patient tolerated treatment well    Behavior During Therapy  [facility] for tasks assessed/performed       Past Medical History:  Diagnosis Date  . Arthritis   . Asthma   . Cancer ([place])    chemo for breast ca  x29mo  . Depression   . Deviated nasal septum   . Hearing loss 06/11/2014   Bilateral  . Herniated cervical disc   . Hypertension   . Migraine   . Miscarriage    2  . MS (multiple sclerosis) ([facility]     Past Surgical History:  Procedure Laterality Date  . BREAST BIOPSY Right 11/21/2018   Malignant  . HEMATOMA EVACUATION Right 05/01/2019   Procedure: EVACUATION HEMATOMA;  Surgeon: WRolm Bookbinder MD;  [place] [facility]  Service: General;  Laterality: Right;  . MASTECTOMY Right 04/30/2019  . MASTECTOMY W/ SENTINEL NODE BIOPSY Right 04/30/2019   Procedure: RIGHT MASTECTOMY WITH  RIGHT AXILLARY SENTINEL LYMPH NODE BIOPSY INJECT BLUE DYE;  Surgeon: WRolm Bookbinder MD;  [place] [facility]  Service: General;  Laterality: Right;  . NASAL SEPTUM SURGERY    . SIMPLE MASTECTOMY WITH AXILLARY SENTINEL NODE BIOPSY Right 04/30/2019  . spinal injections      There were no vitals filed for this visit.  Subjective Assessment - 04/02/20 1603    Subjective  Pt reports that she got a cortisone shot today in her shoulder around 1 pm. She is a  little sore.    Pertinent History  Pt recently seen here for same shoulder pain now returning with less pain with cortisone shot. Rt mastectomy 04/30/19 with SLNB due to grade 2 IDC, high grade DCIS 0/2 lymph nodes. ER/PR positive, HER2 equivocal and then Rt breast hematoma evacuation on 05/01/19 by Dr. WDonne Hazel  Neo adjuvant chemotherapy TCH Perjeta started 12/12/18 to current. anastrozole x 7 years. Other history includes MS ( currently under control so will not address this episode) , HTN, OA, asthma, bilateral hearing loss    Patient Stated Goals  more motion    Currently in Pain?  Yes    Pain Score  5     Pain Location  Shoulder    Pain Orientation  Left    Pain Descriptors / Indicators  Aching    Pain Type  Chronic pain;Acute pain    Pain Onset  More than a month ago    Pain Frequency  Intermittent    Aggravating Factors   reaching    Pain Relieving Factors  moist heat    Effect of Pain on Daily Activities  difficulty with house hold chores and self care.  [facility] Adult PT Treatment/Exercise - 04/02/20 0001      Shoulder Exercises: Supine   Horizontal ABduction  Strengthening;Both;10 reps;Weights    Horizontal ABduction Weight (lbs)  1    Horizontal ABduction Limitations  2 sets with demonstration and VC to avoid pain    Flexion  Strengthening;Both;10 reps    Shoulder Flexion Weight (lbs)  1    Flexion Limitations  2 sets, VC to avoid pain chest press then flexion     Other Supine Exercises  Chest press with 1# wgt 10x bil no difficulty with this activity       Shoulder Exercises: Sidelying   External Rotation  Strengthening;Left;10 reps    External Rotation Weight (lbs)  1    External Rotation Limitations  VC to keep elbow tucked and demonstration for movement. VC to avoid pain pt went from stomach to ~90 degrees     ABduction  Left;10 reps    ABduction Weight (lbs)  1    ABduction Limitations  VC to avoid pain good mobility pt went to 90  degrees      Shoulder Exercises: Standing   Other Standing Exercises  rolling ball up the wall into flexion bil UE and then LUE into abduction with VC for just stretching and avoiding pain 10x each movement.       Shoulder Exercises: Pulleys   Flexion  2 minutes    Flexion Limitations  VC for pain-free     Scaption  2 minutes    Scaption Limitations  VC for pain-free and demonstration      Manual Therapy   Manual Therapy  Soft tissue mobilization;Passive ROM    Soft tissue mobilization  easy soft tissue mobilization to the L deltoid, biceps and upper trapezious prior to P/ROM avoiding injection site    Passive ROM  East P/ROM to pt end range with easy end range stretch with very minimal increase in pain.              PT Education - 04/02/20 1724    Education Details  Pt educated to continue with her exercises at home.    Person(s) Educated  Patient    Methods  Explanation    Comprehension  Verbalized understanding          PT Long Term Goals - 03/03/20 1350      PT LONG TERM GOAL #1   Title  Pt will improve left shoulder AROM to flexion: 150,  Abduction: 150, and ER of 70 to demonstrate improved mobility    Baseline  125/115/30    Time  4    Period  Weeks    Status  New      PT LONG TERM GOAL #2   Title  Pt will demonstrate MMT of the left shoulder at 4/5 or greater    Time  4    Period  Weeks    Status  New      PT LONG TERM GOAL #3   Title  Pt will decrease her Quick DASH score to < 20 indicating a functional improvment of left shoulder    Baseline  47.73 on 11/27/2019; 15.91 - 12/31/19, 25 on 03/03/20    Time  4    Period  Weeks    Status  New      PT LONG TERM GOAL #4   Title  pt will be ind with final HEP    Time  4    Period  Weeks  Status  New            Plan - 04/02/20 1602    Clinical Impression Statement  Pt went for a cortisone injection in the L shoulder today in the early afternoon. Easy exercises/stretching this session in order not to  aggravate injection site. Pt was able to tolerate ROM and light strengthening activities. Easy STM to the L shoulder, upper arm and neck this session followed by P/ROM to pt end range with easy end range stretch. No significant increase in pain this session as reported by pt when prompted. Pt will benefit from continued POC at this time.    Personal Factors and Comorbidities  Comorbidity 2    Comorbidities  MS, previous chemo    Examination-Activity Limitations  Reach Overhead;Lift;Hygiene/Grooming;Dressing;Bathing;Toileting    Rehab Potential  Good    PT Frequency  2x / week    PT Duration  3 weeks    PT Treatment/Interventions  ADLs/Self Care Home Management;Iontophoresis 71m/ml Dexamethasone;Electrical Stimulation;Moist Heat;Therapeutic activities;Therapeutic exercise;Neuromuscular re-education;Patient/family education;Manual techniques;Manual lymph drainage;Passive range of motion;Taping;Joint Manipulations    PT Next Visit Plan  Update HEP and progress note. Assess STM, Mobs. Left shoulder PROM, AAROM, scapular and postural exercises    PT Home Exercise Plan  currently has rockwood exercises, wall walking, towel up the back stretch, and information on ordering pulleys    Consulted and Agree with Plan of Care  Patient       Patient will benefit from skilled therapeutic intervention in order to improve the following deficits and impairments:  Decreased range of motion, Decreased strength, Increased fascial restricitons, Impaired UE functional use, Postural dysfunction, Impaired perceived functional ability, Increased muscle spasms, Pain  Visit Diagnosis: Stiffness of left shoulder joint  Muscle weakness (generalized)  Abnormal posture  Acute pain of left shoulder  Stiffness of right shoulder, not elsewhere classified  Malignant neoplasm of upper-outer quadrant of right breast in female, estrogen receptor positive ([facility]     Problem List Patient Active Problem List   Diagnosis Date  Noted  . High risk medication use 03/06/2020  . Chronic left shoulder pain 02/14/2020  . Shingles 12/24/2019  . Breast cancer, right ([place] 04/30/2019  . Port-A-Cath in place 02/13/2019  . Syncope 01/19/2019  . Nausea vomiting and diarrhea 01/19/2019  . Leukocytosis 01/19/2019  . Malignant neoplasm of upper-outer quadrant of right breast in female, estrogen receptor positive ([facility] 11/27/2018  . Neurogenic claudication due to lumbar spinal stenosis 09/19/2018  . Urinary tract infection without hematuria 08/10/2018  . Chronic left-sided thoracic back pain 07/20/2018  . Tendon nodule 04/21/2018  . Solar lentigo 04/21/2018  . Preventative health care 04/20/2018  . Acute cystitis without hematuria 03/16/2018  . Gait disturbance 01/12/2018  . Polyuria 12/08/2017  . Easy bruising 08/31/2017  . Chronic obstructive pulmonary disease ([facility] 11/30/2016  . Essential hypertension 01/26/2016  . Migraine headache 06/23/2014  . Multiple sclerosis ([facility] 06/23/2014  . Paresthesias 06/23/2014  . Restless leg syndrome 06/23/2014  . Hearing loss, bilateral 06/11/2014  . Major depression, recurrent, chronic ([facility] 06/11/2014  . Cervical herniated disc 06/11/2014  . Cigarette nicotine dependence without complication 041/66/0630 . Lumbar herniated disc 06/11/2014  . Perimenopausal symptoms 06/11/2014    CAnder Purpura PT 04/02/2020, 5:27 PM  [place] [address]Phone: [phone]  Fax:  [phone] Name: SChayil GanttMRN: [id number] of Birth: [birth year]

## 2020-04-02 NOTE — Telephone Encounter (Signed)
See message below °

## 2020-04-04 ENCOUNTER — Other Ambulatory Visit: Payer: Self-pay | Admitting: Hematology and Oncology

## 2020-04-04 MED ORDER — OXYCODONE-ACETAMINOPHEN 5-325 MG PO TABS: 1.0000 | ORAL_TABLET | Freq: Three times a day (TID) | ORAL | 0 refills | Status: DC | PRN

## 2020-04-09 ENCOUNTER — Other Ambulatory Visit: Payer: Self-pay

## 2020-04-09 ENCOUNTER — Encounter: Payer: Self-pay | Admitting: Rehabilitation

## 2020-04-09 ENCOUNTER — Ambulatory Visit: Payer: Medicare HMO | Admitting: Rehabilitation

## 2020-04-09 DIAGNOSIS — M25612 Stiffness of left shoulder, not elsewhere classified: Secondary | ICD-10-CM | POA: Diagnosis not present

## 2020-04-09 DIAGNOSIS — M6281 Muscle weakness (generalized): Secondary | ICD-10-CM | POA: Diagnosis not present

## 2020-04-09 DIAGNOSIS — R293 Abnormal posture: Secondary | ICD-10-CM

## 2020-04-09 DIAGNOSIS — M25611 Stiffness of right shoulder, not elsewhere classified: Secondary | ICD-10-CM | POA: Diagnosis not present

## 2020-04-09 DIAGNOSIS — M25512 Pain in left shoulder: Secondary | ICD-10-CM | POA: Diagnosis not present

## 2020-04-09 DIAGNOSIS — Z170 Estrogen receptor positive status [ER+]: Secondary | ICD-10-CM | POA: Diagnosis not present

## 2020-04-09 DIAGNOSIS — C50411 Malignant neoplasm of upper-outer quadrant of right female breast: Secondary | ICD-10-CM | POA: Diagnosis not present

## 2020-04-09 NOTE — Therapy (Signed)
[place], [place], [address] Phone: [phone]   Fax:  [phone]  Physical Therapy Treatment  Patient Details  Name: Debbie Watts MRN: [id number] Date of Birth: 1960-12-04 Referring Provider (PT): Dr. Erlinda Hong   Encounter Date: 04/09/2020   PT End of Session - 04/09/20 1347    Visit Number 7    Number of Visits 9    Date for PT Re-Evaluation 04/14/20    PT Start Time 1301    PT Stop Time 1340    PT Time Calculation (min) 39 min    Activity Tolerance Patient tolerated treatment well    Behavior During Therapy [facility] for tasks assessed/performed           Past Medical History:  Diagnosis Date  . Arthritis   . Asthma   . Cancer ([place])    chemo for breast ca  x2mo  . Depression   . Deviated nasal septum   . Hearing loss 06/11/2014   Bilateral  . Herniated cervical disc   . Hypertension   . Migraine   . Miscarriage    2  . MS (multiple sclerosis) ([place]     Past Surgical History:  Procedure Laterality Date  . BREAST BIOPSY Right 11/21/2018   Malignant  . HEMATOMA EVACUATION Right 05/01/2019   Procedure: EVACUATION HEMATOMA;  Surgeon: WRolm Bookbinder MD;  [place]  Service: General;  Laterality: Right;  . MASTECTOMY Right 04/30/2019  . MASTECTOMY W/ SENTINEL NODE BIOPSY Right 04/30/2019   Procedure: RIGHT MASTECTOMY WITH  RIGHT AXILLARY SENTINEL LYMPH NODE BIOPSY INJECT BLUE DYE;  Surgeon: WRolm Bookbinder MD;  [place] [facility]  Service: General;  Laterality: Right;  . NASAL SEPTUM SURGERY    . SIMPLE MASTECTOMY WITH AXILLARY SENTINEL NODE BIOPSY Right 04/30/2019  . spinal injections      There were no vitals filed for this visit.   Subjective Assessment - 04/09/20 1304    Subjective I don't think I need you guys anymore. I have no pain and good motion    Pertinent History Pt recently seen here for same shoulder pain now returning with less pain with cortisone shot. Rt  mastectomy 04/30/19 with SLNB due to grade 2 IDC, high grade DCIS 0/2 lymph nodes. ER/PR positive, HER2 equivocal and then Rt breast hematoma evacuation on 05/01/19 by Dr. WDonne Hazel  Neo adjuvant chemotherapy TCH Perjeta started 12/12/18 to current. anastrozole x 7 years. Other history includes MS ( currently under control so will not address this episode) , HTN, OA, asthma, bilateral hearing loss    Currently in Pain? No/denies                     QKatina Dung- 04/09/20 0001    Open a tight or new jar No difficulty    Do heavy household chores (wash walls, wash floors) No difficulty    Carry a shopping bag or briefcase No difficulty    Wash your back No difficulty    Use a knife to cut food No difficulty    Recreational activities in which you take some force or impact through your arm, shoulder, or hand (golf, hammering, tennis) No difficulty    During the past week, to what extent has your arm, shoulder or hand problem interfered with your normal social activities with family, friends, neighbors, or groups? Not at all    During the past week, to what extent has your arm, shoulder or hand  problem limited your work or other regular daily activities Not at all    Arm, shoulder, or hand pain. None    Tingling (pins and needles) in your arm, shoulder, or hand None    Difficulty Sleeping No difficulty    DASH Score 0 %                  [facility] Adult PT Treatment/Exercise - 04/09/20 0001      Shoulder Exercises: Standing   External Rotation Left;20 reps    Theraband Level (Shoulder External Rotation) Level 2 (Red)    Internal Rotation Left;20 reps    Theraband Level (Shoulder Internal Rotation) Level 2 (Red)    Flexion Left;15 reps    Theraband Level (Shoulder Flexion) Level 2 (Red)    ABduction Left;15 reps    Theraband Level (Shoulder ABduction) Level 2 (Red)    ABduction Limitations elbow bent    Row Both;20 reps    Theraband Level (Shoulder Row) Level 2 (Red)       Manual Therapy   Soft tissue mobilization in seated to the left supraspinatus, infraspinatus, and upper trapezius trigger points                       PT Long Term Goals - 04/09/20 1305      PT LONG TERM GOAL #1   Title Pt will improve left shoulder AROM to flexion: 150,  Abduction: 150, and ER of 70 to demonstrate improved mobility    Baseline 142/140/80    Status Partially Met      PT LONG TERM GOAL #2   Title Pt will demonstrate MMT of the left shoulder at 4/5 or greater    Baseline 147 on 12/11/2019; 140 degrees - 12/31/19,    on 04/09/20 all motions 4+/5 flex, abd, ER, IR    Status Achieved      PT LONG TERM GOAL #3   Title Pt will decrease her Quick DASH score to < 20 indicating a functional improvment of left shoulder    Baseline 47.73 on 11/27/2019; 15.91 - 12/31/19, 25 on 03/03/20, 0 on 04/09/20    Status Achieved      PT LONG TERM GOAL #4   Title pt will be ind with final HEP    Status Achieved                 Plan - 04/09/20 1348    Clinical Impression Statement Pt reports wanting to discharge today with all goals met except for Flexion and Abduction AROM although much improved and no impingement symptoms.  Pt still exhibits protracted scapula, active trigger points in the rotator cuff musculature, and chronic impingement but pt with much improved MMT this time/today.    Personal Factors and Comorbidities Comorbidity 2    Examination-Activity Limitations Reach Overhead;Lift;Hygiene/Grooming;Dressing;Bathing;Toileting    Stability/Clinical Decision Making Stable/Uncomplicated    PT Frequency 2x / week    PT Duration 3 weeks    PT Treatment/Interventions ADLs/Self Care Home Management;Iontophoresis 47m/ml Dexamethasone;Electrical Stimulation;Moist Heat;Therapeutic activities;Therapeutic exercise;Neuromuscular re-education;Patient/family education;Manual techniques;Manual lymph drainage;Passive range of motion;Taping;Joint Manipulations    PT Home Exercise Plan  currently has rockwood exercises, wall walking, towel up the back stretch, and information on ordering pulleys    Consulted and Agree with Plan of Care Patient           Patient will benefit from skilled therapeutic intervention in order to improve the following deficits and impairments:  Decreased range of motion,  Decreased strength, Increased fascial restricitons, Impaired UE functional use, Postural dysfunction, Impaired perceived functional ability, Increased muscle spasms, Pain  Visit Diagnosis: Stiffness of left shoulder joint  Muscle weakness (generalized)  Abnormal posture  Acute pain of left shoulder  Stiffness of right shoulder, not elsewhere classified  Malignant neoplasm of upper-outer quadrant of right breast in female, estrogen receptor positive ([place])     Problem List Patient Active Problem List   Diagnosis Date Noted  . High risk medication use 03/06/2020  . Chronic left shoulder pain 02/14/2020  . Shingles 12/24/2019  . Breast cancer, right ([place]) 04/30/2019  . Port-A-Cath in place 02/13/2019  . Syncope 01/19/2019  . Nausea vomiting and diarrhea 01/19/2019  . Leukocytosis 01/19/2019  . Malignant neoplasm of upper-outer quadrant of right breast in female, estrogen receptor positive ([place]) 11/27/2018  . Neurogenic claudication due to lumbar spinal stenosis 09/19/2018  . Urinary tract infection without hematuria 08/10/2018  . Chronic left-sided thoracic back pain 07/20/2018  . Tendon nodule 04/21/2018  . Solar lentigo 04/21/2018  . Preventative health care 04/20/2018  . Acute cystitis without hematuria 03/16/2018  . Gait disturbance 01/12/2018  . Polyuria 12/08/2017  . Easy bruising 08/31/2017  . Chronic obstructive pulmonary disease ([place]) 11/30/2016  . Essential hypertension 01/26/2016  . Migraine headache 06/23/2014  . Multiple sclerosis ([place]) 06/23/2014  . Paresthesias 06/23/2014  . Restless leg syndrome 06/23/2014  . Hearing loss, bilateral  06/11/2014  . Major depression, recurrent, chronic ([place]) 06/11/2014  . Cervical herniated disc 06/11/2014  . Cigarette nicotine dependence without complication 50/51/8335  . Lumbar herniated disc 06/11/2014  . Perimenopausal symptoms 06/11/2014    Shan Levans, PT 04/09/2020, 1:51 PM  [place], [place], [address] Phone: [phone]   Fax:  [phone]  Name: Debbie Watts MRN: [id number] Date of Birth: 1961/03/03  PHYSICAL THERAPY DISCHARGE SUMMARY  Visits from Start of Care: 7  Current functional level related to goals / functional outcomes: See above   Remaining deficits: Chronic shoulder impingement    Education / Equipment: HEP Plan: Patient agrees to discharge.  Patient goals were partially met. Patient is being discharged due to being pleased with the current functional level.  ?????     Shan Levans, PT

## 2020-04-21 ENCOUNTER — Other Ambulatory Visit: Payer: Self-pay | Admitting: Neurology

## 2020-04-29 ENCOUNTER — Other Ambulatory Visit: Payer: Self-pay | Admitting: Hematology and Oncology

## 2020-05-19 ENCOUNTER — Telehealth: Payer: Self-pay | Admitting: Neurology

## 2020-05-19 NOTE — Telephone Encounter (Signed)
Pt called in 10:42 am stating that she needs a refill on a medication. The message was broken and was unable to determine what medication she is referring to.

## 2020-05-19 NOTE — Telephone Encounter (Signed)
Called pt back to get further info. She was wanting a refill on diclofenac tablet 75mg . Advised this was last prescribed by a Dr. Erlinda Hong, Marylynn Pearson, MD 02/20/20 #30, 2 refills. She should contact their office for a refill. Dr. Felecia Shelling has not prescribed for her in the past. She verbalized understanding. States she was given this for her shoulder.

## 2020-05-30 ENCOUNTER — Other Ambulatory Visit: Payer: Self-pay | Admitting: Hematology and Oncology

## 2020-06-02 ENCOUNTER — Ambulatory Visit (INDEPENDENT_AMBULATORY_CARE_PROVIDER_SITE_OTHER): Payer: Medicare HMO | Admitting: Student

## 2020-06-02 ENCOUNTER — Telehealth: Payer: Self-pay | Admitting: Hematology and Oncology

## 2020-06-02 ENCOUNTER — Encounter: Payer: Self-pay | Admitting: Student

## 2020-06-02 ENCOUNTER — Other Ambulatory Visit: Payer: Self-pay

## 2020-06-02 VITALS — BP 121/72 | HR 91 | Temp 97.9°F | Ht 62.0 in | Wt 115.3 lb

## 2020-06-02 DIAGNOSIS — J449 Chronic obstructive pulmonary disease, unspecified: Secondary | ICD-10-CM | POA: Diagnosis not present

## 2020-06-02 DIAGNOSIS — I10 Essential (primary) hypertension: Secondary | ICD-10-CM | POA: Diagnosis not present

## 2020-06-02 DIAGNOSIS — G8929 Other chronic pain: Secondary | ICD-10-CM | POA: Diagnosis not present

## 2020-06-02 DIAGNOSIS — Z0000 Encounter for general adult medical examination without abnormal findings: Secondary | ICD-10-CM

## 2020-06-02 DIAGNOSIS — F17210 Nicotine dependence, cigarettes, uncomplicated: Secondary | ICD-10-CM | POA: Diagnosis not present

## 2020-06-02 DIAGNOSIS — Z13220 Encounter for screening for lipoid disorders: Secondary | ICD-10-CM | POA: Diagnosis not present

## 2020-06-02 DIAGNOSIS — F339 Major depressive disorder, recurrent, unspecified: Secondary | ICD-10-CM

## 2020-06-02 DIAGNOSIS — M25512 Pain in left shoulder: Secondary | ICD-10-CM | POA: Diagnosis not present

## 2020-06-02 DIAGNOSIS — M7502 Adhesive capsulitis of left shoulder: Secondary | ICD-10-CM

## 2020-06-02 MED ORDER — FLUTICASONE PROPIONATE 50 MCG/ACT NA SUSP: 2.0000 | Freq: Every day | NASAL | 2 refills | Status: DC

## 2020-06-02 NOTE — Telephone Encounter (Signed)
Unable to reach pt to reschedule appt on 8/17 per 8/9 sch message. Left voicemail for pt to give office a call back.

## 2020-06-02 NOTE — Assessment & Plan Note (Signed)
PHQ-9 of 12 today. She states that her significant other is depressed, and this contributes to her symptoms. Denies SI, HI, hallucinations. She states her symptoms are stable and are satisfied with the efficacy of her medication. Declines referral for therapy or medication adjustments.  -continue Paxil 20mg 

## 2020-06-02 NOTE — Assessment & Plan Note (Signed)
Reports she is smoking 1-1.5ppd. States it is difficult to quit because her significant other smokes 2ppd. Offered nicotine patches and Chantix, but patient has had them before and declines at this time.  -patient will try to cut back -can prescribe medical to help if patient requests

## 2020-06-02 NOTE — Patient Instructions (Signed)
Thank you for allowing [place] to be a part of your care today, it was pleasure seeing you. We discussed your ongoing medical problems including smoking, COPD, depression, and health maintenance.   I am checking these labs: lipid panel, basic metabolic panel, fecal occult blood testing  I have refilled your Flonase.  [facility]. You will require 2 doses.  Please let us know if you have increasing COPD symptoms. We can start you on a daily maintenance medication. Please also let us know if you would like a referral to therapy or try adding another medication for your depressive symptoms.   Please call the ENT, Ulice Brilliant, at [phone], to reschedule your appointment.  Please follow up in 1 month for your pap smear with HPV co-testing. Please follow up in 6 months for your other chronic medical issues.   Thank you, and please call the Internal Medicine Clinic at [phone] if you have any questions.  Best, Dr. Bridgett Larsson

## 2020-06-02 NOTE — Assessment & Plan Note (Signed)
Last pap smear 2017. Offered today, but she has an appointment to get to this afternoon. Wants to return for this in 1 month.  Due for colon cancer screening.   -lipid panel  -BMP -fecal occult stool/immunochemical test -pap smear with co-testing in 1 month -patient [facility]

## 2020-06-02 NOTE — Progress Notes (Signed)
   CC: follow up on HTN, depression, COPD, health screenings  HPI:  [name] is a 59 y.o. female with history as below presenting for follow for the above problems. Denies any particular complaints at this time. Please refer to problem based charting for further details of assessment and plan of current problem and chronic medical conditions.   Past Medical History:  Diagnosis Date  . Arthritis   . Asthma   . Cancer ([place])    chemo for breast ca  x43mos  . Depression   . Deviated nasal septum   . Hearing loss 06/11/2014   Bilateral  . Herniated cervical disc   . Hypertension   . Leukocytosis 01/19/2019  . Migraine   . Miscarriage    2  . MS (multiple sclerosis) ([place])   . Urinary tract infection without hematuria 08/10/2018   Review of Systems: Review of Systems  Constitutional: Negative for chills and fever.  HENT: Positive for hearing loss.   Respiratory: Positive for shortness of breath (chronic). Negative for cough.   Gastrointestinal: Negative for constipation.  Skin: Positive for itching. Negative for rash.  Psychiatric/Behavioral: Positive for depression. Negative for hallucinations and suicidal ideas.     Physical Exam: Vitals:   06/02/20 1337  BP: 121/72  Pulse: 91  Temp: 97.9 F (36.6 C)  TempSrc: Oral  SpO2: 100%  Weight: 115 lb 4.8 oz (52.3 kg)  Height: 5\' 2"  (1.575 m)   Constitutional: no acute distress Head: atraumatic ENT: external ears normal, hearing decreased Cardiovascular: regular rate and rhythm, normal heart sounds Pulmonary: effort normal, normal breath sounds bilaterally Abdominal: flat, nontender, no rebound tenderness, bowel sounds normal Skin: warm and dry Neurological: alert, no focal deficit Psychiatric: normal mood and affect  Assessment & Plan:   See Encounters Tab for problem based charting.  Patient seen with Dr. Daryll Drown

## 2020-06-02 NOTE — Assessment & Plan Note (Signed)
Blood pressure 121/72 today. No symptoms. Stable on lisinopril dose for quite some time.  -continue lisinopril 5mg  -BMP today

## 2020-06-02 NOTE — Telephone Encounter (Signed)
Rescheduled appt to 8/23 per 8/9 sch message. Pt is aware of appt time and date.

## 2020-06-02 NOTE — Assessment & Plan Note (Signed)
States this is "so much better" after physical therapy and joint injections by ortho. Discharged from outpatient PT on 6/16 because she was satisfied with her functional level.

## 2020-06-02 NOTE — Assessment & Plan Note (Signed)
Reports stable symptoms. Uses her Ventolin inhaler 3-4 days out of the week. Offered her a maintenance medication, she will consider this if symptom worsen.  -continue ventolin prn -consider maintenance medication if symptoms worsen

## 2020-06-03 LAB — LIPID PANEL
Chol/HDL Ratio: 2.3 ratio (ref 0.0–4.4)
Cholesterol, Total: 127 mg/dL (ref 100–199)
HDL: 55 mg/dL (ref >39)
LDL Chol Calc (NIH): 56 mg/dL (ref 0–99)
Triglycerides: 82 mg/dL (ref 0–149)
VLDL Cholesterol Cal: 16 mg/dL (ref 5–40)

## 2020-06-03 LAB — BMP8+ANION GAP
Anion Gap: 15 mmol/L (ref 10.0–18.0)
BUN/Creatinine Ratio: 18 (ref 9–23)
BUN: 12 mg/dL (ref 6–24)
CO2: 27 mmol/L (ref 20–29)
Calcium: 8.9 mg/dL (ref 8.7–10.2)
Chloride: 100 mmol/L (ref 96–106)
Creatinine, Ser: 0.67 mg/dL (ref 0.57–1.00)
GFR calc Af Amer: 112 mL/min/{1.73_m2} (ref >59)
GFR calc non Af Amer: 97 mL/min/{1.73_m2} (ref >59)
Glucose: 94 mg/dL (ref 65–99)
Potassium: 4.6 mmol/L (ref 3.5–5.2)
Sodium: 142 mmol/L (ref 134–144)

## 2020-06-03 NOTE — Progress Notes (Signed)
Internal Medicine Clinic Attending  I saw and evaluated the patient.  I personally confirmed the key portions of the history and exam documented by Dr. Chen and I reviewed pertinent patient test results.  The assessment, diagnosis, and plan were formulated together and I agree with the documentation in the resident's note.  

## 2020-06-06 DIAGNOSIS — Z9011 Acquired absence of right breast and nipple: Secondary | ICD-10-CM | POA: Diagnosis not present

## 2020-06-09 ENCOUNTER — Ambulatory Visit (INDEPENDENT_AMBULATORY_CARE_PROVIDER_SITE_OTHER): Payer: Medicare HMO | Admitting: Internal Medicine

## 2020-06-09 ENCOUNTER — Encounter: Payer: Self-pay | Admitting: Internal Medicine

## 2020-06-09 VITALS — BP 126/79 | HR 94 | Temp 98.0°F | Ht 62.0 in | Wt 144.3 lb

## 2020-06-09 DIAGNOSIS — N390 Urinary tract infection, site not specified: Secondary | ICD-10-CM

## 2020-06-09 DIAGNOSIS — Z08 Encounter for follow-up examination after completed treatment for malignant neoplasm: Secondary | ICD-10-CM

## 2020-06-09 DIAGNOSIS — R399 Unspecified symptoms and signs involving the genitourinary system: Secondary | ICD-10-CM | POA: Diagnosis not present

## 2020-06-09 DIAGNOSIS — M545 Low back pain: Secondary | ICD-10-CM

## 2020-06-09 DIAGNOSIS — Z853 Personal history of malignant neoplasm of breast: Secondary | ICD-10-CM | POA: Diagnosis not present

## 2020-06-09 HISTORY — DX: Urinary tract infection, site not specified: N39.0

## 2020-06-09 LAB — POCT URINALYSIS DIPSTICK
Glucose, UA: NEGATIVE
Leukocytes, UA: NEGATIVE
Nitrite, UA: POSITIVE
Protein, UA: POSITIVE — AB
Spec Grav, UA: >=1.03 — AB (ref 1.010–1.025)
Urobilinogen, UA: 2 E.U./dL — AB
pH, UA: 5 (ref 5.0–8.0)

## 2020-06-09 MED ORDER — NITROFURANTOIN MONOHYD MACRO 100 MG PO CAPS: 100.0000 mg | ORAL_CAPSULE | Freq: Two times a day (BID) | ORAL | 0 refills | Status: AC

## 2020-06-09 NOTE — Assessment & Plan Note (Signed)
Patient presented with dysuria, frequency and urgency that started yesterday morning.  No fever or chills, no flank pain, no nausea vomiting. Dipstick urine test was positive for nitrate.  Had more than 300 protein, glucose 100.  Negative for leukocyte. We will treat her for uncomplicated UTI.  Will send urine culture.  -Macrobid 100 mg twice daily x5 days -Follow-up urine culture

## 2020-06-09 NOTE — Patient Instructions (Addendum)
Thank you for allowing [place] to provide your care today. Today we discussed urinary symptoms.  Your urine test here showed evidence of UTI.  We will send a prescription for antibiotic (Macrobid).  Please take it up and take 1 tablet every 12 hours for 5 days. We will send a urine culture as well. Today we did not make any changes in the rest of your medications.  Please take them as before  Please follow-up in clinic if your symptoms got worse or if you develop any fever chills or flank pain.  Always, if having severe symptoms, please seek medical attention at emergency room. Should you have any questions or concerns please call the internal medicine clinic at [phone].    Thank you!

## 2020-06-09 NOTE — Progress Notes (Signed)
   CC: UTI symptoms.  HPI:  [name] is a 59 y.o. female with PMHx as documented below, presented with dysuria, frequency and urgency but this started yesterday morning. Please refer to problem based charting for further details and assessment and plan of current problem and chronic medical conditions.    Past Medical History:  Diagnosis Date  . Arthritis   . Asthma   . Cancer ([place])    chemo for breast ca  x50mos  . Depression   . Deviated nasal septum   . Hearing loss 06/11/2014   Bilateral  . Herniated cervical disc   . Hypertension   . Leukocytosis 01/19/2019  . Migraine   . Miscarriage    2  . MS (multiple sclerosis) ([place])   . Shingles 12/24/2019  . Urinary tract infection without hematuria 08/10/2018   Review of Systems: Positive for dysuria, frequency, and urgency.  Negative for fever or chills.  Negative for flank pain.  Negative for nausea vomiting.  Physical Exam:  Vitals:   06/09/20 1453  BP: 126/79  Pulse: 94  Temp: 98 F (36.7 C)  TempSrc: Oral  SpO2: 99%  Weight: 144 lb 4.8 oz (65.5 kg)  Height: 5\' 2"  (1.575 m)   Physical Exam Constitutional:      General: She is not in acute distress.    Appearance: Normal appearance. She is not ill-appearing.  Cardiovascular:     Rate and Rhythm: Normal rate and regular rhythm.     Pulses: Normal pulses.     Heart sounds: Normal heart sounds. No murmur heard.   Pulmonary:     Effort: Pulmonary effort is normal. No respiratory distress.     Breath sounds: Normal breath sounds. No wheezing or rales.  Abdominal:     General: There is no distension.     Palpations: Abdomen is soft.     Tenderness: There is no abdominal tenderness. There is no right CVA tenderness, left CVA tenderness or guarding.  Musculoskeletal:     Right lower leg: No edema.     Left lower leg: No edema.  Neurological:     Mental Status: She is oriented to person, place, and time.  Psychiatric:        Mood and Affect: Mood normal.         Behavior: Behavior normal.        Thought Content: Thought content normal.        Judgment: Judgment normal.    Assessment & Plan:   See Encounters Tab for problem based charting.  Patient discussed with Dr. Philipp Ovens

## 2020-06-10 ENCOUNTER — Ambulatory Visit: Payer: Medicare HMO | Admitting: Hematology and Oncology

## 2020-06-10 LAB — URINALYSIS, COMPLETE
Bilirubin, UA: NEGATIVE
Glucose, UA: NEGATIVE
Nitrite, UA: POSITIVE — AB
Specific Gravity, UA: 1.022 (ref 1.005–1.030)
Urobilinogen, Ur: 1 mg/dL (ref 0.2–1.0)
pH, UA: 5 (ref 5.0–7.5)

## 2020-06-10 LAB — MICROSCOPIC EXAMINATION
Casts: NONE SEEN /lpf
WBC, UA: >30 /hpf — AB (ref 0–5)

## 2020-06-10 NOTE — Progress Notes (Signed)
Internal Medicine Clinic Attending  Case discussed with Dr. Masoudi  At the time of the visit.  We reviewed the resident's history and exam and pertinent patient test results.  I agree with the assessment, diagnosis, and plan of care documented in the resident's note.  

## 2020-06-12 DIAGNOSIS — Z9011 Acquired absence of right breast and nipple: Secondary | ICD-10-CM | POA: Diagnosis not present

## 2020-06-14 LAB — URINE CULTURE

## 2020-06-15 NOTE — Progress Notes (Signed)
Patient Care Team: Andrew Au, MD as PCP - General Excell Seltzer, MD (Inactive) as Consulting Physician (General Surgery) Nicholas Lose, MD as Consulting Physician (Hematology and Oncology) Kyung Rudd, MD as Consulting Physician (Radiation Oncology)  DIAGNOSIS:    ICD-10-CM   1. Malignant neoplasm of upper-outer quadrant of right breast in female, estrogen receptor positive ([place])  C50.411    Z17.0     SUMMARY OF ONCOLOGIC HISTORY: Oncology History  Malignant neoplasm of upper-outer quadrant of right breast in female, estrogen receptor positive ([place])  11/21/2018 Initial Diagnosis   Screening detected right breast mass upper outer quadrant, in addition to areas of calcifications which were not biopsied.  By ultrasound she had 2 breast masses 12 o'clock position 2.9 cm: Grade 2 IDC with high-grade DCIS ER 100%, PR 70%, Ki-67 10%, HER-2 3+ by IHC and 11 o'clock position 1 cm, ER 90%, PR 50%, Ki-67 15%, HER-2 3+ by IHC, T2N0 stage Ib   11/29/2018 Cancer Staging   Staging form: Breast, AJCC 8th Edition - Clinical stage from 11/29/2018: Stage IB (cT2, cN0, cM0, G3, ER+, PR+, HER2+) - Signed by Nicholas Lose, MD on 11/29/2018   12/12/2018 -  Neo-Adjuvant Chemotherapy   Neoadjuvant chemotherapy with [facility]eta   01/18/2019 - 01/20/2019 Hospital Admission   Syncope   04/30/2019 Surgery   Right mastectomy: Grade 2 IDC, 3.9 cm, high-grade DCIS, margins are negative, 0/2 lymph nodes negative, ER 95%, PR 80%, HER-2 2+ equivocal, T2 N0   04/30/2019 Cancer Staging   Staging form: Breast, AJCC 8th Edition - Pathologic stage from 04/30/2019: No Stage Recommended (ypT2, pN0, cM0, G2, ER+, PR+, HER2-) - Signed by Gardenia Phlegm, NP on 05/09/2019   05/24/2019 -  Chemotherapy   The patient had ado-trastuzumab emtansine (KADCYLA) 200 mg in sodium chloride 0.9 % 250 mL chemo infusion, 240 mg, Intravenous, Once, 10 of 10 cycles Dose modification: 3 mg/kg (original dose 3.6 mg/kg, Cycle 5, Reason:  Dose not tolerated) Administration: 200 mg (05/24/2019), 200 mg (06/14/2019), 160 mg (08/16/2019), 160 mg (09/06/2019), 160 mg (09/27/2019), 160 mg (10/31/2019), 160 mg (07/05/2019), 160 mg (07/26/2019), 160 mg (11/21/2019), 160 mg (12/12/2019)  for chemotherapy treatment.    05/24/2019 -  Anti-estrogen oral therapy   Anastrozole 1 mg daily     CHIEF COMPLIANT: Follow-up of right breast cancer on anastrozole  INTERVAL HISTORY: Debbie Watts is a 59 y.o. with above-mentioned history of right breast cancer who completed neoadjuvant chemotherapy with [facility],underwent a right mastectomy, completedadjuvantKadcyla, and is currently onanti-estrogen therapy with anastrozole.She presents to the clinic today for follow-up.  Apart from intermittent back pain she is tolerating anastrozole extremely well.  She has lost about 20 pounds weight and is very happy about it.  She continues to smoke cigarettes.  ALLERGIES:  is allergic to procaine.  MEDICATIONS:  Current Outpatient Medications  Medication Sig Dispense Refill  . acetaminophen-codeine (TYLENOL #3) 300-30 MG tablet Take 1 tablet by mouth 3 (three) times daily as needed for moderate pain. 30 tablet 0  . anastrozole (ARIMIDEX) 1 MG tablet TAKE 1 TABLET BY MOUTH EVERY DAY 90 tablet 3  . aspirin-acetaminophen-caffeine (EXCEDRIN MIGRAINE) 250-250-65 MG tablet Take 2 tablets by mouth every 6 (six) hours as needed for migraine.    . betamethasone valerate lotion (VALISONE) 0.1 % Apply 1 application topically daily as needed for irritation.     . carbidopa-levodopa (SINEMET IR) 10-100 MG tablet TAKE 1 TABLET BY MOUTH EVERYDAY AT BEDTIME 90 tablet 3  . diclofenac (  VOLTAREN) 75 MG EC tablet Take 1 tablet (75 mg total) by mouth 2 (two) times daily. 30 tablet 2  . diphenhydrAMINE (BENADRYL) 25 MG tablet Take 25 mg by mouth at bedtime as needed.     . fluconazole (DIFLUCAN) 100 MG tablet Take 1 tablet (100 mg total) by mouth daily. 30 tablet 0  .  fluticasone (FLONASE) 50 MCG/ACT nasal spray Place 2 sprays into both nostrils at bedtime. 18.2 mL 2  . gabapentin (NEURONTIN) 300 MG capsule Take 2 capsules (600 mg total) by mouth 2 (two) times daily. 360 capsule 3  . lisinopril (ZESTRIL) 5 MG tablet TAKE 1 TABLET BY MOUTH EVERY DAY 90 tablet 1  . Meloxicam 15 MG TBDP Take 15 mg by mouth daily. 30 tablet 0  . methocarbamol (ROBAXIN) 500 MG tablet Take 1 tablet (500 mg total) by mouth every 8 (eight) hours as needed for muscle spasms. 20 tablet 1  . Multiple Vitamin (MULTIVITAMIN WITH MINERALS) TABS tablet Take 2 tablets by mouth daily.    Marland Kitchen omeprazole (PRILOSEC OTC) 20 MG tablet Take 20 mg by mouth daily as needed (acid reflux).    Marland Kitchen PARoxetine (PAXIL) 20 MG tablet TAKE 1 TABLET BY MOUTH EVERY DAY 90 tablet 1  . TECFIDERA 240 MG CPDR Take 1 capsule by mouth twice daily 180 capsule 3  . traMADol (ULTRAM) 50 MG tablet TAKE 1 TABLET BY MOUTH 2 TIMES DAILY AS NEEDED. 60 tablet 5  . traMADol (ULTRAM) 50 MG tablet Take 1-2 tablets (50-100 mg total) by mouth daily as needed. 20 tablet 0  . valACYclovir (VALTREX) 1000 MG tablet Take 1 tablet (1,000 mg total) by mouth 2 (two) times daily. 14 tablet 0  . VENTOLIN HFA 108 (90 Base) MCG/ACT inhaler INHALE 1-2 PUFFS INTO THE LUNGS EVERY 6 (SIX) HOURS AS NEEDED FOR WHEEZING OR SHORTNESS OF BREATH. 54 g 1   No current facility-administered medications for this visit.    PHYSICAL EXAMINATION: ECOG PERFORMANCE STATUS: 1 - Symptomatic but completely ambulatory  Vitals:   06/16/20 1415  BP: 128/80  Pulse: 72  Resp: 17  Temp: 99.2 F (37.3 C)  SpO2: 99%   Filed Weights   06/16/20 1415  Weight: 114 lb 14.4 oz (52.1 kg)    BREAST: Right mastectomy no palpable lumps or nodules in the left breast. (exam performed in the presence of a chaperone)  LABORATORY DATA:  I have reviewed the data as listed CMP Latest Ref Rng & Units 06/02/2020 12/12/2019 11/21/2019  Glucose 65 - 99 mg/dL 94 93 92  BUN 6 - 24  mg/dL _0 Creatinine 0.57 - 1.00 mg/dL 0.67 0.68 0.74  Sodium 134 - 144 mmol/L 142 141 143  Potassium 3.5 - 5.2 mmol/L 4.6 3.7 3.6  Chloride 96 - 106 mmol/L 100 103 105  CO2 20 - 29 mmol/L _1 Calcium 8.7 - 10.2 mg/dL 8.9 8.9 8.4(L)  Total Protein 6.5 - 8.1 g/dL - 7.3 6.5  Total Bilirubin 0.3 - 1.2 mg/dL - 0.4 0.4  Alkaline Phos 38 - 126 U/L - 85 89  AST 15 - 41 U/L - 35 24  ALT 0 - 44 U/L - 44 28    Lab Results  Component Value Date   WBC 3.5 03/06/2020   HGB 14.4 03/06/2020   HCT 42.7 03/06/2020   MCV 96 03/06/2020   PLT 240 03/06/2020   NEUTROABS 2.1 03/06/2020    ASSESSMENT & PLAN:  Malignant neoplasm of upper-outer quadrant  of right breast in female, estrogen receptor positive ([place]) 11/21/2018:Screening detected right breast mass upper outer quadrant, in addition to areas of calcifications which were not biopsied. By ultrasound she had 2 breast masses 12 o'clock position 2.9 cm: Grade 2 IDC with high-grade DCIS ER 100%, PR 70%, Ki-67 10%, HER-2 3+ by IHC and 11 o'clock position 1 cm, ER 90%, PR 50%, Ki-67 15%, HER-2 3+ by IHC, T2N0 stage Ib  Neoadjuvant chemotherapy with [facility] x6 cycles 12/12/2018-03/27/2019  04/30/2019:Right mastectomy: Grade 2 IDC, 3.9 cm, high-grade DCIS, margins are negative, 0/2 lymph nodes negative, ER 95%, PR 80%, HER-2 2+ equivocal, T2 N0  Treatment plan: 1.Adjuvant Kadcylastarted 05/24/2019- 12/12/19 2.Adjuvant antiestrogen therapy with anastrozole 1 mg p.o. daily x7 yearsstarted 05/24/2019 ----------------------------------------------------------------------------------------------------------------------------- Low back pain with pain radiating down the leg:On Percocets 09/24/2019: MRI lumbar spine: Negative for metastatic disease. Mild degenerative changes withoutdisc protrusion or stenosis 09/24/2019: Bone scan: Nonspecific uptake right 11th rib  Anastrozole toxicities: Denies any hot flashes or myalgias. Shingles:  12/21/19 Completed Valtrex  Breast cancer surveillance: 1. left breast MRI  01/10/2020: No MRI evidence of malignancy in the left breast, status post right mastectomy. 2. breast exam 06/16/2020: Benign 3.  Mammogram left breast 11/23/2019: Benign breast density category C    Brain MRI 02/19/2020: Multiple foci of chronic demyelinating plaque associated with multiple sclerosis  Return to clinic in 1 year for follow-up   No orders of the defined types were placed in this encounter.  The patient has a good understanding of the overall plan. she agrees with it. she will call with any problems that may develop before the next visit here.  Total time spent: 20 mins including face to face time and time spent for planning, charting and coordination of care  Nicholas Lose, MD 06/16/2020  I, Cloyde Reams Dorshimer, am acting as scribe for Dr. Nicholas Lose.  I have reviewed the above documentation for accuracy and completeness, and I agree with the above.

## 2020-06-16 ENCOUNTER — Inpatient Hospital Stay: Payer: Medicare HMO | Attending: Hematology and Oncology | Admitting: Hematology and Oncology

## 2020-06-16 ENCOUNTER — Other Ambulatory Visit: Payer: Self-pay

## 2020-06-16 ENCOUNTER — Other Ambulatory Visit: Payer: Self-pay | Admitting: Internal Medicine

## 2020-06-16 DIAGNOSIS — Z791 Long term (current) use of non-steroidal anti-inflammatories (NSAID): Secondary | ICD-10-CM | POA: Insufficient documentation

## 2020-06-16 DIAGNOSIS — Z79811 Long term (current) use of aromatase inhibitors: Secondary | ICD-10-CM | POA: Insufficient documentation

## 2020-06-16 DIAGNOSIS — F17210 Nicotine dependence, cigarettes, uncomplicated: Secondary | ICD-10-CM | POA: Diagnosis not present

## 2020-06-16 DIAGNOSIS — Z9011 Acquired absence of right breast and nipple: Secondary | ICD-10-CM | POA: Insufficient documentation

## 2020-06-16 DIAGNOSIS — I10 Essential (primary) hypertension: Secondary | ICD-10-CM

## 2020-06-16 DIAGNOSIS — F339 Major depressive disorder, recurrent, unspecified: Secondary | ICD-10-CM

## 2020-06-16 DIAGNOSIS — Z79899 Other long term (current) drug therapy: Secondary | ICD-10-CM | POA: Diagnosis not present

## 2020-06-16 DIAGNOSIS — Z170 Estrogen receptor positive status [ER+]: Secondary | ICD-10-CM | POA: Diagnosis not present

## 2020-06-16 DIAGNOSIS — C50411 Malignant neoplasm of upper-outer quadrant of right female breast: Secondary | ICD-10-CM | POA: Diagnosis not present

## 2020-06-16 DIAGNOSIS — Z7982 Long term (current) use of aspirin: Secondary | ICD-10-CM | POA: Insufficient documentation

## 2020-06-16 DIAGNOSIS — Z9221 Personal history of antineoplastic chemotherapy: Secondary | ICD-10-CM | POA: Diagnosis not present

## 2020-06-16 MED ORDER — LISINOPRIL 5 MG PO TABS: 5.0000 mg | ORAL_TABLET | Freq: Every day | ORAL | 1 refills | Status: DC

## 2020-06-16 MED ORDER — PAROXETINE HCL 20 MG PO TABS: 20.0000 mg | ORAL_TABLET | Freq: Every day | ORAL | 1 refills | Status: DC

## 2020-06-16 NOTE — Progress Notes (Signed)
Sending refills for requested meds.

## 2020-06-16 NOTE — Assessment & Plan Note (Signed)
11/21/2018:Screening detected right breast mass upper outer quadrant, in addition to areas of calcifications which were not biopsied. By ultrasound she had 2 breast masses 12 o'clock position 2.9 cm: Grade 2 IDC with high-grade DCIS ER 100%, PR 70%, Ki-67 10%, HER-2 3+ by IHC and 11 o'clock position 1 cm, ER 90%, PR 50%, Ki-67 15%, HER-2 3+ by IHC, T2N0 stage Ib  Neoadjuvant chemotherapy with [facility] x6 cycles 12/12/2018-03/27/2019  04/30/2019:Right mastectomy: Grade 2 IDC, 3.9 cm, high-grade DCIS, margins are negative, 0/2 lymph nodes negative, ER 95%, PR 80%, HER-2 2+ equivocal, T2 N0  Treatment plan: 1.Adjuvant Kadcylastarted 05/24/2019- 12/12/19 2.Adjuvant antiestrogen therapy with anastrozole 1 mg p.o. daily x7 yearsstarted 05/24/2019 ----------------------------------------------------------------------------------------------------------------------------- Low back pain with pain radiating down the leg:On Percocets 09/24/2019: MRI lumbar spine: Negative for metastatic disease. Mild degenerative changes withoutdisc protrusion or stenosis 09/24/2019: Bone scan: Nonspecific uptake right 11th rib  Anastrozole toxicities: Denies any hot flashes or myalgias. Shingles: 12/21/19 Completed Valtrex  Breast cancer surveillance: 1. left breast MRI  01/10/2020: No MRI evidence of malignancy in the left breast, status post right mastectomy. 2. breast exam 06/16/2020: Benign 3.  Mammogram left breast 11/23/2019: Benign breast density category C She will need another MRI of the breast  Brain MRI 02/19/2020: Multiple foci of chronic demyelinating plaque associated with multiple sclerosis  

## 2020-06-17 ENCOUNTER — Telehealth: Payer: Self-pay | Admitting: Hematology and Oncology

## 2020-06-17 NOTE — Telephone Encounter (Signed)
Scheduled appts per 8/23 los. Pt confirmed appt date and time.

## 2020-06-19 ENCOUNTER — Other Ambulatory Visit: Payer: Self-pay | Admitting: Neurology

## 2020-06-19 DIAGNOSIS — G35 Multiple sclerosis: Secondary | ICD-10-CM

## 2020-06-24 ENCOUNTER — Encounter: Payer: Medicare HMO | Admitting: Student

## 2020-06-25 ENCOUNTER — Other Ambulatory Visit: Payer: Self-pay | Admitting: Hematology and Oncology

## 2020-06-25 ENCOUNTER — Other Ambulatory Visit: Payer: Self-pay | Admitting: Student

## 2020-06-28 ENCOUNTER — Other Ambulatory Visit: Payer: Self-pay | Admitting: Hematology and Oncology

## 2020-07-02 ENCOUNTER — Other Ambulatory Visit: Payer: Self-pay | Admitting: *Deleted

## 2020-07-02 DIAGNOSIS — J449 Chronic obstructive pulmonary disease, unspecified: Secondary | ICD-10-CM

## 2020-07-04 MED ORDER — VENTOLIN HFA 108 (90 BASE) MCG/ACT IN AERS: INHALATION_SPRAY | RESPIRATORY_TRACT | 1 refills | Status: DC

## 2020-07-08 DIAGNOSIS — H906 Mixed conductive and sensorineural hearing loss, bilateral: Secondary | ICD-10-CM | POA: Diagnosis not present

## 2020-07-08 DIAGNOSIS — H90A31 Mixed conductive and sensorineural hearing loss, unilateral, right ear with restricted hearing on the contralateral side: Secondary | ICD-10-CM | POA: Diagnosis not present

## 2020-07-08 DIAGNOSIS — J310 Chronic rhinitis: Secondary | ICD-10-CM | POA: Diagnosis not present

## 2020-07-08 DIAGNOSIS — H6981 Other specified disorders of Eustachian tube, right ear: Secondary | ICD-10-CM | POA: Diagnosis not present

## 2020-07-10 ENCOUNTER — Other Ambulatory Visit: Payer: Self-pay

## 2020-07-10 ENCOUNTER — Ambulatory Visit (INDEPENDENT_AMBULATORY_CARE_PROVIDER_SITE_OTHER): Payer: Medicare HMO | Admitting: Student

## 2020-07-10 ENCOUNTER — Encounter: Payer: Self-pay | Admitting: Student

## 2020-07-10 VITALS — BP 115/64 | HR 76 | Temp 98.9°F | Ht 62.0 in | Wt 114.4 lb

## 2020-07-10 DIAGNOSIS — Z23 Encounter for immunization: Secondary | ICD-10-CM

## 2020-07-10 DIAGNOSIS — Z0000 Encounter for general adult medical examination without abnormal findings: Secondary | ICD-10-CM | POA: Diagnosis not present

## 2020-07-10 DIAGNOSIS — G479 Sleep disorder, unspecified: Secondary | ICD-10-CM | POA: Insufficient documentation

## 2020-07-10 DIAGNOSIS — F339 Major depressive disorder, recurrent, unspecified: Secondary | ICD-10-CM | POA: Diagnosis not present

## 2020-07-10 DIAGNOSIS — N390 Urinary tract infection, site not specified: Secondary | ICD-10-CM

## 2020-07-10 DIAGNOSIS — I10 Essential (primary) hypertension: Secondary | ICD-10-CM

## 2020-07-10 DIAGNOSIS — G43709 Chronic migraine without aura, not intractable, without status migrainosus: Secondary | ICD-10-CM | POA: Diagnosis not present

## 2020-07-10 DIAGNOSIS — F17210 Nicotine dependence, cigarettes, uncomplicated: Secondary | ICD-10-CM

## 2020-07-10 HISTORY — DX: Sleep disorder, unspecified: G47.9

## 2020-07-10 NOTE — Assessment & Plan Note (Signed)
Patient reports a 2-3 year history of difficulty falling asleep. Reports no trouble staying asleep. Notes that recently she and her boyfriend got a new puppy. She reports she has tried melatonin in the past but did not like that it made her feel drowsy in the morning.   - Counseled the patient on sleep hygiene and exercise - She would like to work on these first, not interested in pharmacotherapy at this time

## 2020-07-10 NOTE — Patient Instructions (Signed)
Debbie Watts,   Thank you for your visit to the [facility] today. It was a pleasure meeting you. Today we discussed the following:  1) Health maintenance - Your last pap smear with HPV co-testing was on 07/26/2016. You will be due for your next pap smear on or after 07/26/2021. - Please complete your colon cancer screening (Fit testing) at your earliest convenience - You received your flu vaccine today  2) Smoking cessation - Continue working on cutting back on your cigarette smoking - Encourage you to try using the nicotine patch and gum/lozenges that you got from the quit line  3) Hypertension - Your blood pressure was 115/64 today - Continue with your lisinopril 5 mg daily - The lab work we got last visit to monitor your kidney function and cholesterol were good   We would like to see you back in 6 months. Please bring all of your medications with you.   If you have any questions or concerns in the meantime, please call our clinic at [phone] between 9am-5pm. Outside of these hours, call [phone] and ask for the internal medicine resident on call. If you feel you are having a medical emergency please call 911.

## 2020-07-10 NOTE — Assessment & Plan Note (Signed)
Patient reports her symptoms from her UTI diagnosed no 06/09/20 cleared with antibiotic treatment (Macrobid). Has not had any additional dysuria, frequency, or urgency.

## 2020-07-10 NOTE — Assessment & Plan Note (Signed)
Patient reports a history of migraines and tension headaches. Notes that her migraines are one-sided and associated with photophobia and nausea. Reports she averages ~5 migraines yearly. Manages them with PRN Excedrin migraine. Tension headaches are responsive to Tylenol.   - Continue to follow-up with neurology. Next appointment is 09/10/20.

## 2020-07-10 NOTE — Assessment & Plan Note (Signed)
-   Cervical cancer screening: on chart review, last pap smear with co-testing was negative from 07/26/2016 with Dr. Jonni Sanger from [facility]. Patient will be due for her next pap smear on or after 07/26/2021. - Patient received fit testing at last visit, is waiting for her boyfriend to also complete his. - Patient reports [facility] - Flu shot today, 07/10/20

## 2020-07-10 NOTE — Assessment & Plan Note (Signed)
BP 115/64 today. No symptoms.  BMP from last visit on 06/02/20 unremarkable.  - continue lisinopril 5 mg

## 2020-07-10 NOTE — Assessment & Plan Note (Signed)
Reports she is smoking 1-2 ppd. Reiterates it is difficult to quit smoking since her partner is an even heavier smoker, 2 ppd. She reports she called a quit line "a while back" and got patches and lozenges, however has not used them. She reports she will continue to work on cutting back.  - Address smoking cessation at subsequent visits

## 2020-07-10 NOTE — Assessment & Plan Note (Signed)
PHQ-9 is 7 today. She reports she is doing well on her Paxil 20 mg.  - continue Paxil 20 mg

## 2020-07-10 NOTE — Progress Notes (Signed)
   CC: follow-up on smoking cessation, healthcare maintenance  HPI:  [name] is a 59 y.o. person with history of breast cancer (10/2018), multiple sclerosis, HTN, depression, chronic migraines who presents to clinic for follow-up of her chronic medical conditions. Her last clinic visit was on 06/09/20 for E Coli UTI for which she was treated with Macrobid, and prior to that she was seen on 06/02/20. In the interim, she saw her breast oncologist on 06/16/20 and ENT on 07/08/20.   To see the details of this patient's management of their acute and chronic problems, please refer to the Assessment & Plan under the Encounters tab.    Past Medical History:  Diagnosis Date  . Arthritis   . Asthma   . Cancer ([place])    chemo for breast ca  x38mos  . Depression   . Deviated nasal septum   . Hearing loss 06/11/2014   Bilateral  . Herniated cervical disc   . Hypertension   . Leukocytosis 01/19/2019  . Migraine   . Miscarriage    2  . MS (multiple sclerosis) ([place])   . Shingles 12/24/2019  . Urinary tract infection without hematuria 08/10/2018   Review of Systems:    Review of Systems  Constitutional: Negative for chills and fever.    Physical Exam:  Vitals:   07/10/20 1558  BP: 115/64  Pulse: 76  Temp: 98.9 F (37.2 C)  TempSrc: Oral  SpO2: 97%  Weight: 114 lb 6.4 oz (51.9 kg)  Height: 5\' 2"  (1.575 m)   Constitutional: well-appearing woman sitting in chair, in no acute distress Head: normocephalic atraumatic ENT: external ears normal, hearing decreased R>L Cardiovascular: regular rate and rhythm, normal heart sounds Pulmonary: effort normal, normal breath sounds bilaterally Skin: warm and dry Neurological: alert & oriented x 3 Psychiatric: normal mood and affect   Assessment & Plan:   See Encounters Tab for problem based charting.  Patient seen with Dr. Daryll Drown

## 2020-07-16 NOTE — Progress Notes (Signed)
Internal Medicine Clinic Attending  I saw and evaluated the patient.  I personally confirmed the key portions of the history and exam documented by Dr. Watson and I reviewed pertinent patient test results.  The assessment, diagnosis, and plan were formulated together and I agree with the documentation in the resident's note.  

## 2020-08-04 ENCOUNTER — Other Ambulatory Visit: Payer: Self-pay | Admitting: Neurology

## 2020-08-04 ENCOUNTER — Other Ambulatory Visit: Payer: Self-pay | Admitting: Hematology and Oncology

## 2020-08-04 ENCOUNTER — Other Ambulatory Visit: Payer: Self-pay | Admitting: Student

## 2020-09-02 ENCOUNTER — Other Ambulatory Visit: Payer: Self-pay | Admitting: Student in an Organized Health Care Education/Training Program

## 2020-09-02 DIAGNOSIS — M7502 Adhesive capsulitis of left shoulder: Secondary | ICD-10-CM

## 2020-09-02 NOTE — Assessment & Plan Note (Addendum)
Patient requested refill on diclofenac tablets which prescribed to her by previous Ortho specialist. I did evaluate her adhesive capsulitis at her last office visit, which was much improved after physical therapy. States she still has fair ROM, but takes the diclofenac in moderation for pain. Last renal function labs were benign.  -refill diclofenac

## 2020-09-10 ENCOUNTER — Other Ambulatory Visit: Payer: Self-pay

## 2020-09-10 ENCOUNTER — Encounter: Payer: Self-pay | Admitting: Neurology

## 2020-09-10 ENCOUNTER — Ambulatory Visit: Payer: Medicare HMO | Admitting: Neurology

## 2020-09-10 VITALS — BP 144/86 | HR 68 | Ht 62.0 in | Wt 114.0 lb

## 2020-09-10 DIAGNOSIS — R269 Unspecified abnormalities of gait and mobility: Secondary | ICD-10-CM

## 2020-09-10 DIAGNOSIS — Z79899 Other long term (current) drug therapy: Secondary | ICD-10-CM | POA: Diagnosis not present

## 2020-09-10 DIAGNOSIS — C50411 Malignant neoplasm of upper-outer quadrant of right female breast: Secondary | ICD-10-CM

## 2020-09-10 DIAGNOSIS — G35 Multiple sclerosis: Secondary | ICD-10-CM

## 2020-09-10 DIAGNOSIS — G4489 Other headache syndrome: Secondary | ICD-10-CM

## 2020-09-10 DIAGNOSIS — M542 Cervicalgia: Secondary | ICD-10-CM | POA: Diagnosis not present

## 2020-09-10 DIAGNOSIS — Z170 Estrogen receptor positive status [ER+]: Secondary | ICD-10-CM

## 2020-09-10 MED ORDER — KETOROLAC TROMETHAMINE 60 MG/2ML IM SOLN
60.0000 mg | Freq: Once | INTRAMUSCULAR | Status: AC
  Administered 2020-09-10: 60 mg via INTRAMUSCULAR

## 2020-09-10 NOTE — Progress Notes (Signed)
Gave Toradol 60mg /35ml IM in right deltoid. Cleaned with alcohol wipe prior to injection. Band-aid applied. Pt tolerated well.

## 2020-09-10 NOTE — Progress Notes (Signed)
[facility]  PATIENT: Debbie Watts DOB: 31-Oct-1960    HISTORICAL  CHIEF COMPLAINT:  Chief Complaint  Patient presents with  . Follow-up    RM 12, alone. Last seen 03/06/20. She has a headache today.  . Multiple Sclerosis    On Tecfidera  . Pain    Takes tramadol prn    HISTORY OF PRESENT ILLNESS:  Debbie Watts is a 59 y.o. woman with relapsing remitting multiple sclerosis.  Update 09/10/2020: She feels her MS is doing well.    She is on Tecfidera and tolerates it well.   No recent exacerbation or new neurologic symptoms.    Last lymphocyte count was 0.8 but had been 0.6 previously  She is walking about the same with reduced balance.  She had one fall 2 months ago while walkig her dog and tripped..   Her right leg is weaker than her left and more clumsy.   Arms are more symmetric.   She has dysesthesias in hr legs/feet.    Bladder function is better,   Vision is fine.    She has fatigue.  She sleeps well some nights.      She has frequent headaches arising form the left occiput.  She uses a curved pillow as it helps her neck.    She has a HA today since walking up.  Intensity is 7/10 and it is worse on the left..  She has had a shot of Toradol in the past for similar pain with benefit.  Mood is doing better.   She notes some fatigue.  She sleeps ok most nights but some sleep maintenance insomnia.      She was diagnosed with breast cancer January 2020.     She is on TCHP (docetaxel, carboplatin, trastuzumab, pertuzumab (Perjeta)).   She has had 5 cycles of chemotherapy and her last infusion will be June 2.      She is on anastrozole now.     MS history:  She was diagnosed with MS in 1999 based on MRI and lumbar rupture.  At that time she was living in [place] and was seeing Dr. Tilden Fossa.  When he retired she saw another doctor and then she moved to this area around 2015 but did not seek neurologic referral until 2015.  She was on Avonex for many years but  stopped due to insurance.  Then, in November 2016, she started in the drug study and was on ALKS 8700 4 2 years.  The study ended in late 2018 for her and she switched over to [place].      Images: MRI brain showed multiple T2/FLAIR hyperintense foci in the hemispheres in a pattern and configuration consistent with chronic demyelinating plaque associated with multiple sclerosis.  None of the foci appear to be acute.  They do not enhance.  Compared to the MRI dated 08/09/2017, there are no new lesions.  MRI cervical spine showed 3 T2 hyperintense foci within the spinal cord adjacent to C2, C4-C5 and C6-C7.  All of these were present on the 2015 MRI.  They do not enhance.  They are consistent with chronic demyelinating plaque associated with multiple sclerosis.   Moderate spinal stenosis at C5-C6 and mild spinal stenosis at C4-C5 and C6-C7.  There does not appear to be any nerve root compression at these levels.  Degenerative changes at C5-C6 have mildly progressed compared to the 2015 MRI.  REVIEW OF SYSTEMS: Constitutional: No fevers, chills, sweats, or change in appetite.  She has fatigue.  She sometimes has insomnia. Eyes: No visual changes, double vision, eye pain Ear, nose and throat: No hearing loss, ear pain, nasal congestion, sore throat Cardiovascular: No chest pain, palpitations Respiratory: No shortness of breath at rest or with exertion.   No wheezes GastrointestinaI: No nausea, vomiting, diarrhea, abdominal pain, fecal incontinence Genitourinary:She has urinary frequency and nocturia . Musculoskeletal: No neck pain, back pain Integumentary: No rash, pruritus, skin lesions Neurological: as above Psychiatric: No depression at this time.  No anxiety Endocrine: No palpitations, diaphoresis, change in appetite, change in weigh or increased thirst Hematologic/Lymphatic: No anemia, purpura, petechiae. Allergic/Immunologic: No itchy/runny eyes, nasal congestion, recent allergic  reactions, rashes  ALLERGIES: Allergies  Allergen Reactions  . Procaine Shortness Of Breath    HOME MEDICATIONS:  Current Outpatient Medications:  .  acetaminophen-codeine (TYLENOL #3) 300-30 MG tablet, Take 1 tablet by mouth 3 (three) times daily as needed for moderate pain., Disp: 30 tablet, Rfl: 0 .  anastrozole (ARIMIDEX) 1 MG tablet, TAKE 1 TABLET BY MOUTH EVERY DAY, Disp: 90 tablet, Rfl: 3 .  aspirin-acetaminophen-caffeine (EXCEDRIN MIGRAINE) 250-250-65 MG tablet, Take 2 tablets by mouth every 6 (six) hours as needed for migraine., Disp: , Rfl:  .  betamethasone valerate lotion (VALISONE) 0.1 %, Apply 1 application topically daily as needed for irritation. , Disp: , Rfl:  .  carbidopa-levodopa (SINEMET IR) 10-100 MG tablet, TAKE 1 TABLET BY MOUTH EVERYDAY AT BEDTIME, Disp: 90 tablet, Rfl: 3 .  diclofenac (VOLTAREN) 75 MG EC tablet, TAKE 1 TABLET BY MOUTH TWICE A DAY, Disp: 60 tablet, Rfl: 3 .  diphenhydrAMINE (BENADRYL) 25 MG tablet, Take 25 mg by mouth at bedtime as needed. , Disp: , Rfl:  .  fluconazole (DIFLUCAN) 100 MG tablet, TAKE 1 TABLET BY MOUTH EVERY DAY, Disp: 30 tablet, Rfl: 0 .  fluticasone (FLONASE) 50 MCG/ACT nasal spray, PLACE 2 SPRAYS INTO BOTH NOSTRILS AT BEDTIME., Disp: 48 mL, Rfl: 2 .  gabapentin (NEURONTIN) 300 MG capsule, TAKE 2 CAPSULES (600 MG TOTAL) BY MOUTH 2 (TWO) TIMES DAILY., Disp: 360 capsule, Rfl: 3 .  lisinopril (ZESTRIL) 5 MG tablet, Take 1 tablet (5 mg total) by mouth daily., Disp: 90 tablet, Rfl: 1 .  methocarbamol (ROBAXIN) 500 MG tablet, Take 1 tablet (500 mg total) by mouth every 8 (eight) hours as needed for muscle spasms., Disp: 20 tablet, Rfl: 1 .  Multiple Vitamin (MULTIVITAMIN WITH MINERALS) TABS tablet, Take 2 tablets by mouth daily., Disp: , Rfl:  .  omeprazole (PRILOSEC OTC) 20 MG tablet, Take 20 mg by mouth daily as needed (acid reflux)., Disp: , Rfl:  .  PARoxetine (PAXIL) 20 MG tablet, Take 1 tablet (20 mg total) by mouth daily., Disp: 90  tablet, Rfl: 1 .  TECFIDERA 240 MG CPDR, Take 1 capsule by mouth twice daily, Disp: 180 capsule, Rfl: 3 .  traMADol (ULTRAM) 50 MG tablet, TAKE 1 TABLET BY MOUTH TWICE A DAY AS NEEDED, Disp: 60 tablet, Rfl: 5 .  valACYclovir (VALTREX) 1000 MG tablet, Take 1 tablet (1,000 mg total) by mouth 2 (two) times daily., Disp: 14 tablet, Rfl: 0 .  VENTOLIN HFA 108 (90 Base) MCG/ACT inhaler, INHALE 1-2 PUFFS INTO THE LUNGS EVERY 6 (SIX) HOURS AS NEEDED FOR WHEEZING OR SHORTNESS OF BREATH., Disp: 54 g, Rfl: 1  PAST MEDICAL HISTORY: Past Medical History:  Diagnosis Date  . Arthritis   . Asthma   . Cancer ([place])    chemo for breast ca  x20mos  .  Depression   . Deviated nasal septum   . Hearing loss 06/11/2014   Bilateral  . Herniated cervical disc   . Hypertension   . Leukocytosis 01/19/2019  . Migraine   . Miscarriage    2  . MS (multiple sclerosis) ([place])   . Shingles 12/24/2019  . Urinary tract infection without hematuria 08/10/2018    PAST SURGICAL HISTORY: Past Surgical History:  Procedure Laterality Date  . BREAST BIOPSY Right 11/21/2018   Malignant  . HEMATOMA EVACUATION Right 05/01/2019   Procedure: EVACUATION HEMATOMA;  Surgeon: Rolm Bookbinder, MD;  Location: [place];  Service: General;  Laterality: Right;  . MASTECTOMY Right 04/30/2019  . MASTECTOMY W/ SENTINEL NODE BIOPSY Right 04/30/2019   Procedure: RIGHT MASTECTOMY WITH  RIGHT AXILLARY SENTINEL LYMPH NODE BIOPSY INJECT BLUE DYE;  Surgeon: Rolm Bookbinder, MD;  Location: [facility];  Service: General;  Laterality: Right;  . NASAL SEPTUM SURGERY    . SIMPLE MASTECTOMY WITH AXILLARY SENTINEL NODE BIOPSY Right 04/30/2019  . spinal injections      FAMILY HISTORY: Family History  Adopted: Yes  Problem Relation Age of Onset  . Cancer Maternal Uncle        throat    SOCIAL HISTORY:  Social History   Socioeconomic History  . Marital status: Single    Spouse name: Not on file  . Number of children: 2  . Years of education: 80   . Highest education level: Not on file  Occupational History  . Occupation: Disabled  Tobacco Use  . Smoking status: Current Every Day Smoker    Packs/day: 1.00    Years: 44.00    Pack years: 44.00    Types: Cigarettes  . Smokeless tobacco: Never Used  . Tobacco comment: 1 pk per day  Vaping Use  . Vaping Use: Former  Substance and Sexual Activity  . Alcohol use: Yes    Comment: 0-5 beers daily  . Drug use: Yes    Types: Marijuana  . Sexual activity: Not Currently    Partners: Male  Other Topics Concern  . Not on file  Social History Narrative   Patient is single with 2 children.   Patient is right handed.   Current Social History 08/10/2018        Right handed       Patient lives with significant other [name]) in one level Townhome 08/10/2018    Transportation: Patient has own vehicle  08/10/2018   Important Relationships [name] 08/10/2018    Pets: one dog named Browser 08/10/2018   Education / Work:  12th grade/ Disabled 08/10/2018   Interests / Fun: Watching baseball and Nascar 08/10/2018   Current Stressors: Significant other is depressed over his poor health 08/10/2018   Religious / Personal Beliefs: "I believe in God" 08/10/2018   Other: "I had a miscarriage before I had my 2 sons." 08/10/2018   L. Silvano Rusk, RN, BSN                                                                                                 Social Determinants of  Health   Financial Resource Strain:   . Difficulty of Paying Living Expenses: Not on file  Food Insecurity:   . Worried About Charity fundraiser in the Last Year: Not on file  . Ran Out of Food in the Last Year: Not on file  Transportation Needs:   . Lack of Transportation (Medical): Not on file  . Lack of Transportation (Non-Medical): Not on file  Physical Activity:   . Days of Exercise per Week: Not on file  . Minutes of Exercise per Session: Not on file  Stress:   . Feeling of Stress : Not on file  Social  Connections:   . Frequency of Communication with Friends and Family: Not on file  . Frequency of Social Gatherings with Friends and Family: Not on file  . Attends Religious Services: Not on file  . Active Member of Clubs or Organizations: Not on file  . Attends Archivist Meetings: Not on file  . Marital Status: Not on file  Intimate Partner Violence:   . Fear of Current or Ex-Partner: Not on file  . Emotionally Abused: Not on file  . Physically Abused: Not on file  . Sexually Abused: Not on file     PHYSICAL EXAM  Vitals:   09/10/20 1142  BP: (!) 144/86  Pulse: 68  SpO2: 98%  Weight: 114 lb (51.7 kg)  Height: 5\' 2"  (1.575 m)    Body mass index is 20.85 kg/m.   General: The patient is well-developed and well-nourished and in no acute distress.   She has tenderness in the left occiput radiating up her skull with deep palpation.  Neurologic Exam  Mental status: The patient is alert and oriented x 3 at the time of the examination. The patient has apparent normal recent and remote memory, with an apparently normal attention span and concentration ability.   Speech is normal.  Cranial nerves: Extraocular movements are full.  Facial strength and sensation was normal.  The trapezius strength was normal bilaterally.  The tongue is midline, and the patient has symmetric elevation of the soft palate. Bilateral hearing deficits are noted.  Motor:  Muscle bulk is normal.   Tone is normal. Strength is  5 / 5 in all 4 extremities.   Sensory: She had slight asymmetry of sensation, reduced on the left to touch and vibration  Coordination: Cerebellar testing shows good finger-nose-finger.  Heel-to-shin is mildly reduced bilaterally.  Gait and station: Station is normal.  Gait is mildly wide.  Tandem gait is wide.  Romberg is negative.  Reflexes: Deep tendon reflexes are symmetric and normal in arms, increased in legs but no clonus at ankles.        DIAGNOSTIC DATA (LABS,  IMAGING, TESTING) - I reviewed patient records, labs, notes, testing and imaging myself where available.  Lab Results  Component Value Date   WBC 3.5 03/06/2020   HGB 14.4 03/06/2020   HCT 42.7 03/06/2020   MCV 96 03/06/2020   PLT 240 03/06/2020      Component Value Date/Time   NA 142 06/02/2020 1422   K 4.6 06/02/2020 1422   CL 100 06/02/2020 1422   CO2 27 06/02/2020 1422   GLUCOSE 94 06/02/2020 1422   GLUCOSE 93 12/12/2019 1330   BUN 12 06/02/2020 1422   CREATININE 0.67 06/02/2020 1422   CREATININE 0.68 12/12/2019 1330   CALCIUM 8.9 06/02/2020 1422   PROT 7.3 12/12/2019 1330   PROT 6.4 08/31/2017 1524   ALBUMIN 4.2  12/12/2019 1330   ALBUMIN 4.3 08/31/2017 1524   AST 35 12/12/2019 1330   ALT 44 12/12/2019 1330   ALKPHOS 85 12/12/2019 1330   BILITOT 0.4 12/12/2019 1330   GFRNONAA 97 06/02/2020 1422   GFRNONAA >60 12/12/2019 1330   GFRAA 112 06/02/2020 1422   GFRAA >60 12/12/2019 1330   Lab Results  Component Value Date   CHOL 127 06/02/2020   HDL 55 06/02/2020   LDLCALC 56 06/02/2020   TRIG 82 06/02/2020   CHOLHDL 2.3 06/02/2020   Lab Results  Component Value Date   HGBA1C 5.3 12/07/2017   ________________________________________________  Multiple sclerosis ([place]) - Plan: CBC with Differential/Platelet  High risk medication use - Plan: CBC with Differential/Platelet  Malignant neoplasm of upper-outer quadrant of right breast in female, estrogen receptor positive (HCC)  Gait disturbance  Neck pain  Other headache syndrome  1.  Continue Tecfidera.   Check CBC/Diff.     2.   Continue tramadol for pain.  I will send in a new prescription. 3.   Stay active and exercise as tolerated. 4.   60 mg IM Toradol 5.   rtc in 6 months or sooner if new or worsening issues. Maribella Kuna A. Felecia Shelling, MD, [facility] 33/43/5686, 16:83 PM Certified in Neurology, Clinical Neurophysiology, Sleep Medicine, Pain Medicine and Neuroimaging  [facility] [address] [place], [place] [address] [phone]

## 2020-09-11 ENCOUNTER — Telehealth: Payer: Self-pay | Admitting: *Deleted

## 2020-09-11 LAB — CBC WITH DIFFERENTIAL/PLATELET
Basophils Absolute: 0.1 10*3/uL (ref 0.0–0.2)
Basos: 1 %
EOS (ABSOLUTE): 0.1 10*3/uL (ref 0.0–0.4)
Eos: 1 %
Hematocrit: 39.7 % (ref 34.0–46.6)
Hemoglobin: 13.5 g/dL (ref 11.1–15.9)
Immature Grans (Abs): 0 10*3/uL (ref 0.0–0.1)
Immature Granulocytes: 0 %
Lymphocytes Absolute: 0.6 10*3/uL — ABNORMAL LOW (ref 0.7–3.1)
Lymphs: 13 %
MCH: 31.2 pg (ref 26.6–33.0)
MCHC: 34 g/dL (ref 31.5–35.7)
MCV: 92 fL (ref 79–97)
Monocytes Absolute: 0.5 10*3/uL (ref 0.1–0.9)
Monocytes: 11 %
Neutrophils Absolute: 3.2 10*3/uL (ref 1.4–7.0)
Neutrophils: 74 %
Platelets: 212 10*3/uL (ref 150–450)
RBC: 4.33 x10E6/uL (ref 3.77–5.28)
RDW: 12.8 % (ref 11.7–15.4)
WBC: 4.5 10*3/uL (ref 3.4–10.8)

## 2020-09-11 NOTE — Telephone Encounter (Signed)
Called and spoke with pt. Relayed results per Dr. Felecia Shelling note. She verbalized understanding.

## 2020-09-11 NOTE — Telephone Encounter (Signed)
-----   Message from Britt Bottom, MD sent at 09/11/2020  1:47 PM EST -----  lymphocyte count is still slightly low.  I would like her to take the Tecfidera twice a day 4 days a week and once a day 3 days a week (for example could do Monday Wednesday Friday 1 pill and Tuesday Thursday Saturday Sunday 2 pills)

## 2020-10-06 ENCOUNTER — Other Ambulatory Visit: Payer: Self-pay | Admitting: Internal Medicine

## 2020-10-06 DIAGNOSIS — I10 Essential (primary) hypertension: Secondary | ICD-10-CM

## 2020-10-06 DIAGNOSIS — F339 Major depressive disorder, recurrent, unspecified: Secondary | ICD-10-CM

## 2020-10-10 ENCOUNTER — Ambulatory Visit: Payer: Medicare HMO

## 2020-10-11 ENCOUNTER — Ambulatory Visit: Payer: Medicare HMO | Attending: Internal Medicine

## 2020-10-11 DIAGNOSIS — Z23 Encounter for immunization: Secondary | ICD-10-CM

## 2020-10-11 NOTE — Progress Notes (Signed)
   Covid-19 Vaccination Clinic  Name:  Debbie Watts    MRN: [id number] DOB: Oct 18, 1961  10/11/2020  [name] was observed post Covid-19 immunization for 15 minutes without incident. She was provided with Vaccine Information Sheet and instruction to access the V-Safe system.   [name] was instructed to call 911 with any severe reactions post vaccine: Marland Kitchen Difficulty breathing  . Swelling of face and throat  . A fast heartbeat  . A bad rash all over body  . Dizziness and weakness   Immunizations Administered    Name Date Dose VIS Date Route   Pfizer COVID-19 Vaccine 10/11/2020 12:08 PM 0.3 mL 08/13/2020 Intramuscular   Manufacturer: [place]   Lot: [id number]   [place]: [id number]

## 2020-10-13 ENCOUNTER — Telehealth: Payer: Self-pay | Admitting: Neurology

## 2020-10-13 NOTE — Addendum Note (Signed)
Addended by: Wyvonnia Lora on: 10/13/2020 03:07 PM   Modules accepted: Orders

## 2020-10-13 NOTE — Telephone Encounter (Addendum)
Called pt to verify she would like to change to generic. She confirmed this. She will have to pay over 400 dollards for brand name. She ran out of Tecfidera this am.  She has [employer]. ID: [id number]. RxBIN: [id number]. RxPCN: [id number]. RxGrp: [id number]. Provider phone# [phone]. She is unsure if [facility] would dispense generic, if not, ok to send to [place].  I called [facility]. Spoke with Milt. Verified they dispense generic dimethyl fumarate. Provided VO for generic. He will mark order stat since patient ran out of meds this am. He is unable to tell me if PA is needed. They will contact us if it is needed.

## 2020-10-13 NOTE — Telephone Encounter (Signed)
Debbie Watts from [facility] called to request a prescription for TECFIDERA 240 MG CPDR for the generic brand. She states pt. needs to switch to the generic brand because it is cheaper. She states if call is needed that anyone in pharmacy will be able to assist.

## 2020-10-14 NOTE — Telephone Encounter (Signed)
Submitted PA dimethyl fumarate on CMM. Key: [id number]. Waiting on determination from [facility].

## 2020-10-15 NOTE — Telephone Encounter (Signed)
Received fax from [facility] that dimethyl fumarate approved for 30days supply at a time, denied for 90days d/t being specialty medication. Faxed notice to [facility] pharmacy at [phone]. Received fax confirmation.

## 2020-11-15 ENCOUNTER — Other Ambulatory Visit: Payer: Self-pay | Admitting: Hematology and Oncology

## 2020-11-19 ENCOUNTER — Telehealth: Payer: Self-pay | Admitting: Neurology

## 2020-11-19 NOTE — Telephone Encounter (Signed)
Called pt back and relayed Dr. Garth Bigness recommendation. She will try this. She will call with update in a couple weeks. She will call sooner if this does not help. She did take a benadryl and this helped.

## 2020-11-19 NOTE — Telephone Encounter (Signed)
Have her take an aspirin with each DMF pill for a couple weeks

## 2020-11-19 NOTE — Telephone Encounter (Signed)
Pt. states she took first dose of Dimethyl Fumarate 240 MG CPDR & she is very itchy. Please advise.

## 2020-12-18 ENCOUNTER — Other Ambulatory Visit: Payer: Self-pay | Admitting: Student

## 2020-12-18 NOTE — Telephone Encounter (Signed)
As mentioned in Dr.Chen's note this was for an acute event and patient has continued to take medication for pain. I will refill for one month. If patient needs further refills she will need to schedule an appointment to discuss with her PCP. There are risk to taking long term NSAIDS.

## 2021-01-21 ENCOUNTER — Other Ambulatory Visit: Payer: Self-pay | Admitting: Neurology

## 2021-01-21 ENCOUNTER — Other Ambulatory Visit: Payer: Self-pay | Admitting: Internal Medicine

## 2021-02-20 ENCOUNTER — Other Ambulatory Visit: Payer: Self-pay | Admitting: Internal Medicine

## 2021-03-05 ENCOUNTER — Other Ambulatory Visit: Payer: Self-pay | Admitting: Hematology and Oncology

## 2021-03-05 ENCOUNTER — Other Ambulatory Visit: Payer: Self-pay | Admitting: Neurology

## 2021-03-12 ENCOUNTER — Ambulatory Visit: Payer: Medicare HMO | Admitting: Neurology

## 2021-03-19 ENCOUNTER — Encounter: Payer: Self-pay | Admitting: Neurology

## 2021-03-19 ENCOUNTER — Other Ambulatory Visit: Payer: Self-pay

## 2021-03-19 ENCOUNTER — Ambulatory Visit: Payer: Medicare HMO | Admitting: Neurology

## 2021-03-19 VITALS — BP 148/87 | HR 66 | Ht 62.0 in | Wt 114.5 lb

## 2021-03-19 DIAGNOSIS — R269 Unspecified abnormalities of gait and mobility: Secondary | ICD-10-CM | POA: Diagnosis not present

## 2021-03-19 DIAGNOSIS — Z79899 Other long term (current) drug therapy: Secondary | ICD-10-CM

## 2021-03-19 DIAGNOSIS — G43709 Chronic migraine without aura, not intractable, without status migrainosus: Secondary | ICD-10-CM | POA: Diagnosis not present

## 2021-03-19 DIAGNOSIS — G35 Multiple sclerosis: Secondary | ICD-10-CM

## 2021-03-19 DIAGNOSIS — R202 Paresthesia of skin: Secondary | ICD-10-CM | POA: Diagnosis not present

## 2021-03-19 DIAGNOSIS — G35D Multiple sclerosis, unspecified: Secondary | ICD-10-CM

## 2021-03-19 NOTE — Progress Notes (Signed)
[facility]  PATIENT: Debbie Watts DOB: 09-21-1961    HISTORICAL  CHIEF COMPLAINT:  Chief Complaint  Patient presents with  . Follow-up    RM 13. Last seen 09/10/2020. On dimethyl fumarate for MS. Takes c/ ASA. Pt reports that she feels a little wobbly on her feet but has not fallen. Pt reports that she is also having lower back pain, about a 7/10 today.     HISTORY OF PRESENT ILLNESS:  Debbie Watts is a 60 y.o. woman with relapsing remitting multiple sclerosis.  Update 03/19/2021 She feels her MS is mildly worse as she has more trouble with balance - this change was gradual.    She is on Tecfidera and tolerates it well.   No definite exacerbation or new neurologic symptoms.    Last lymphocyte count was 0.6 previously  She is walking about the same with reduced balance. No recent fall.   She can walk one mile in about 20-30 minutes but then needs a rest.   She does well with shopping.   As she walks longer, her back hurts more.    Her right leg is weaker than her left and more clumsy.   Arms are more symmetric.   She has dysesthesias in her legs/feet.   Also back pain.   Bladder function is better,   Vision is fine.      Headaches have done better the last few months.    She takes a tramadol when one occurs.  She has frequent headaches arising form the left occiput.   Mood is doing better.   She notes some fatigue.  She sleeps ok most nights but some sleep maintenance insomnia.      She was diagnosed with breast cancer January 2020.   She will be having another mammogram soon.   She is on TCHP (docetaxel, carboplatin, trastuzumab, pertuzumab (Perjeta)).   She has had 5 cycles of chemotherapy and her last infusion will be June 2.      She is on anastrozole now.    She sees Dr.  Lindi Adie    MS history:  She was diagnosed with MS in 1999 based on MRI and lumbar rupture.  At that time she was living in [place] and was seeing Dr. Tilden Fossa.  When he retired she saw  another doctor and then she moved to this area around 2015 but did not seek neurologic referral until 2015.  She was on Avonex for many years but stopped due to insurance.  Then, in November 2016, she started in the drug study and was on ALKS 8700 4 2 years.  The study ended in late 2018 for her and she switched over to [place].      Images: MRI brain showed multiple T2/FLAIR hyperintense foci in the hemispheres in a pattern and configuration consistent with chronic demyelinating plaque associated with multiple sclerosis.  None of the foci appear to be acute.  They do not enhance.  Compared to the MRI dated 08/09/2017, there are no new lesions.  MRI cervical spine showed 3 T2 hyperintense foci within the spinal cord adjacent to C2, C4-C5 and C6-C7.  All of these were present on the 2015 MRI.  They do not enhance.  They are consistent with chronic demyelinating plaque associated with multiple sclerosis.   Moderate spinal stenosis at C5-C6 and mild spinal stenosis at C4-C5 and C6-C7.  There does not appear to be any nerve root compression at these levels.  Degenerative changes at  C5-C6 have mildly progressed compared to the 2015 MRI.  REVIEW OF SYSTEMS: Constitutional: No fevers, chills, sweats, or change in appetite.  She has fatigue.  She sometimes has insomnia. Eyes: No visual changes, double vision, eye pain Ear, nose and throat: No hearing loss, ear pain, nasal congestion, sore throat Cardiovascular: No chest pain, palpitations Respiratory: No shortness of breath at rest or with exertion.   No wheezes GastrointestinaI: No nausea, vomiting, diarrhea, abdominal pain, fecal incontinence Genitourinary:She has urinary frequency and nocturia . Musculoskeletal: No neck pain, back pain Integumentary: No rash, pruritus, skin lesions Neurological: as above Psychiatric: No depression at this time.  No anxiety Endocrine: No palpitations, diaphoresis, change in appetite, change in weigh or increased  thirst Hematologic/Lymphatic: No anemia, purpura, petechiae. Allergic/Immunologic: No itchy/runny eyes, nasal congestion, recent allergic reactions, rashes  ALLERGIES: Allergies  Allergen Reactions  . Procaine Shortness Of Breath    HOME MEDICATIONS:  Current Outpatient Medications:  .  acetaminophen-codeine (TYLENOL #3) 300-30 MG tablet, Take 1 tablet by mouth 3 (three) times daily as needed for moderate pain., Disp: 30 tablet, Rfl: 0 .  anastrozole (ARIMIDEX) 1 MG tablet, TAKE 1 TABLET BY MOUTH EVERY DAY, Disp: 90 tablet, Rfl: 3 .  aspirin-acetaminophen-caffeine (EXCEDRIN MIGRAINE) 250-250-65 MG tablet, Take 2 tablets by mouth every 6 (six) hours as needed for migraine., Disp: , Rfl:  .  betamethasone valerate lotion (VALISONE) 0.1 %, Apply 1 application topically daily as needed for irritation. , Disp: , Rfl:  .  carbidopa-levodopa (SINEMET IR) 10-100 MG tablet, TAKE 1 TABLET BY MOUTH EVERYDAY AT BEDTIME, Disp: 90 tablet, Rfl: 3 .  diclofenac (VOLTAREN) 75 MG EC tablet, TAKE 1 TABLET BY MOUTH TWICE A DAY, Disp: 60 tablet, Rfl: 0 .  Dimethyl Fumarate 240 MG CPDR, Take by mouth. Patient taking differently: Due to low lymphocyte count: Take twice a day 4 days a week and once a day 3 days a week (for example could do Monday Wednesday Friday 1 pill and Tuesday Thursday Saturday Sunday 2 pills, Disp: , Rfl:  .  diphenhydrAMINE (BENADRYL) 25 MG tablet, Take 25 mg by mouth at bedtime as needed. , Disp: , Rfl:  .  fluconazole (DIFLUCAN) 100 MG tablet, TAKE 1 TABLET BY MOUTH EVERY DAY, Disp: 30 tablet, Rfl: 2 .  fluticasone (FLONASE) 50 MCG/ACT nasal spray, PLACE 2 SPRAYS INTO BOTH NOSTRILS AT BEDTIME., Disp: 48 mL, Rfl: 2 .  gabapentin (NEURONTIN) 300 MG capsule, TAKE 2 CAPSULES (600 MG TOTAL) BY MOUTH 2 (TWO) TIMES DAILY., Disp: 360 capsule, Rfl: 3 .  lisinopril (ZESTRIL) 5 MG tablet, TAKE 1 TABLET BY MOUTH EVERY DAY, Disp: 90 tablet, Rfl: 1 .  methocarbamol (ROBAXIN) 500 MG tablet, Take 1  tablet (500 mg total) by mouth every 8 (eight) hours as needed for muscle spasms., Disp: 20 tablet, Rfl: 1 .  Multiple Vitamin (MULTIVITAMIN WITH MINERALS) TABS tablet, Take 2 tablets by mouth daily., Disp: , Rfl:  .  omeprazole (PRILOSEC OTC) 20 MG tablet, Take 20 mg by mouth daily as needed (acid reflux)., Disp: , Rfl:  .  PARoxetine (PAXIL) 20 MG tablet, TAKE 1 TABLET BY MOUTH EVERY DAY, Disp: 90 tablet, Rfl: 1 .  traMADol (ULTRAM) 50 MG tablet, TAKE 1 TABLET BY MOUTH TWICE A DAY AS NEEDED, Disp: 60 tablet, Rfl: 5 .  valACYclovir (VALTREX) 1000 MG tablet, Take 1 tablet (1,000 mg total) by mouth 2 (two) times daily., Disp: 14 tablet, Rfl: 0 .  VENTOLIN HFA 108 (90 Base) MCG/ACT inhaler,  INHALE 1-2 PUFFS INTO THE LUNGS EVERY 6 (SIX) HOURS AS NEEDED FOR WHEEZING OR SHORTNESS OF BREATH., Disp: 54 g, Rfl: 1  PAST MEDICAL HISTORY: Past Medical History:  Diagnosis Date  . Arthritis   . Asthma   . Cancer ([place])    chemo for breast ca  x72mos  . Depression   . Deviated nasal septum   . Hearing loss 06/11/2014   Bilateral  . Herniated cervical disc   . Hypertension   . Leukocytosis 01/19/2019  . Migraine   . Miscarriage    2  . MS (multiple sclerosis) ([place])   . Shingles 12/24/2019  . Urinary tract infection without hematuria 08/10/2018    PAST SURGICAL HISTORY: Past Surgical History:  Procedure Laterality Date  . BREAST BIOPSY Right 11/21/2018   Malignant  . HEMATOMA EVACUATION Right 05/01/2019   Procedure: EVACUATION HEMATOMA;  Surgeon: Rolm Bookbinder, MD;  Location: [facility];  Service: General;  Laterality: Right;  . MASTECTOMY Right 04/30/2019  . MASTECTOMY W/ SENTINEL NODE BIOPSY Right 04/30/2019   Procedure: RIGHT MASTECTOMY WITH  RIGHT AXILLARY SENTINEL LYMPH NODE BIOPSY INJECT BLUE DYE;  Surgeon: Rolm Bookbinder, MD;  Location: [facility];  Service: General;  Laterality: Right;  . NASAL SEPTUM SURGERY    . SIMPLE MASTECTOMY WITH AXILLARY SENTINEL NODE BIOPSY Right 04/30/2019  . spinal  injections      FAMILY HISTORY: Family History  Adopted: Yes  Problem Relation Age of Onset  . Cancer Maternal Uncle        throat    SOCIAL HISTORY:  Social History   Socioeconomic History  . Marital status: Single    Spouse name: Not on file  . Number of children: 2  . Years of education: 41  . Highest education level: Not on file  Occupational History  . Occupation: Disabled  Tobacco Use  . Smoking status: Current Every Day Smoker    Packs/day: 1.00    Years: 44.00    Pack years: 44.00    Types: Cigarettes  . Smokeless tobacco: Never Used  . Tobacco comment: 1 pk per day  Vaping Use  . Vaping Use: Former  Substance and Sexual Activity  . Alcohol use: Yes    Comment: 0-5 beers daily  . Drug use: Yes    Types: Marijuana  . Sexual activity: Not Currently    Partners: Male  Other Topics Concern  . Not on file  Social History Narrative   Patient is single with 2 children.   Patient is right handed.   Current Social History 08/10/2018        Right handed       Patient lives with significant other [name]) in one level Townhome 08/10/2018    Transportation: Patient has own vehicle  08/10/2018   Important Relationships [name] 08/10/2018    Pets: one dog named Browser 08/10/2018   Education / Work:  12th grade/ Disabled 08/10/2018   Interests / Fun: Watching baseball and Nascar 08/10/2018   Current Stressors: Significant other is depressed over his poor health 08/10/2018   Religious / Personal Beliefs: "I believe in God" 08/10/2018   Other: "I had a miscarriage before I had my 2 sons." 08/10/2018   L. Silvano Rusk, RN, BSN  Social Determinants of Health   Financial Resource Strain: Not on file  Food Insecurity: Not on file  Transportation Needs: Not on file  Physical Activity: Not on file  Stress: Not on file  Social Connections: Not on file  Intimate  Partner Violence: Not on file     PHYSICAL EXAM  Vitals:   03/19/21 1124  BP: (!) 148/87  Pulse: 66  Weight: 114 lb 8 oz (51.9 kg)  Height: 5\' 2"  (1.575 m)    Body mass index is 20.94 kg/m.   General: The patient is well-developed and well-nourished and in no acute distress.   She has tenderness in the left occiput radiating up her skull with deep palpation.  Neurologic Exam  Mental status: The patient is alert and oriented x 3 at the time of the examination. The patient has apparent normal recent and remote memory, with an apparently normal attention span and concentration ability.   Speech is normal.  Cranial nerves: Extraocular movements are full.  Facial strength and sensation was normal.  The trapezius strength was normal bilaterally.  The tongue is midline, and the patient has symmetric elevation of the soft palate. Bilateral hearing deficits are noted.  Motor:  Muscle bulk is normal.   Tone is normal. Strength is  5 / 5 in all 4 extremities.   Sensory: She had slight asymmetry of sensation, reduced on the left to touch and vibration  Coordination: Cerebellar testing shows good finger-nose-finger.  Heel-to-shin is mildly reduced bilaterally.  Gait and station: Station is normal.  Gait is mildly wide.  Tandem gait is wide.  Romberg is negative.  Reflexes: Deep tendon reflexes are symmetric and normal in arms, increased in legs but no clonus at ankles.        DIAGNOSTIC DATA (LABS, IMAGING, TESTING) - I reviewed patient records, labs, notes, testing and imaging myself where available.  Lab Results  Component Value Date   WBC 4.5 09/10/2020   HGB 13.5 09/10/2020   HCT 39.7 09/10/2020   MCV 92 09/10/2020   PLT 212 09/10/2020      Component Value Date/Time   NA 142 06/02/2020 1422   K 4.6 06/02/2020 1422   CL 100 06/02/2020 1422   CO2 27 06/02/2020 1422   GLUCOSE 94 06/02/2020 1422   GLUCOSE 93 12/12/2019 1330   BUN 12 06/02/2020 1422   CREATININE 0.67  06/02/2020 1422   CREATININE 0.68 12/12/2019 1330   CALCIUM 8.9 06/02/2020 1422   PROT 7.3 12/12/2019 1330   PROT 6.4 08/31/2017 1524   ALBUMIN 4.2 12/12/2019 1330   ALBUMIN 4.3 08/31/2017 1524   AST 35 12/12/2019 1330   ALT 44 12/12/2019 1330   ALKPHOS 85 12/12/2019 1330   BILITOT 0.4 12/12/2019 1330   GFRNONAA 97 06/02/2020 1422   GFRNONAA >60 12/12/2019 1330   GFRAA 112 06/02/2020 1422   GFRAA >60 12/12/2019 1330   Lab Results  Component Value Date   CHOL 127 06/02/2020   HDL 55 06/02/2020   LDLCALC 56 06/02/2020   TRIG 82 06/02/2020   CHOLHDL 2.3 06/02/2020   Lab Results  Component Value Date   HGBA1C 5.3 12/07/2017   ________________________________________________  Multiple sclerosis ([place]) - Plan: CBC with Differential/Platelet, Comprehensive metabolic panel  High risk medication use - Plan: CBC with Differential/Platelet, Comprehensive metabolic panel  Gait disturbance  Chronic migraine without aura without status migrainosus, not intractable  Paresthesias  1.   Continue Tecfidera.   Check CBC/Diff and CMP 2.   Continue  tramadol for pain.   3.   Stay active and exercise as tolerated. 4.   She has some facet hypertrophy likely explaining back pain.  If back pain worsens, consider repeat Lumbar spine MRI (if worsens do c/s contrast due to breast cancer history) 5.    rtc in 6 months or sooner if new or worsening issues. Dawnn Nam A. Felecia Shelling, MD, [facility] 9/43/2003, 79:44 AM Certified in Neurology, Clinical Neurophysiology, Sleep Medicine, Pain Medicine and Neuroimaging  [facility] [address], [place], [place] [address] [phone]

## 2021-03-20 ENCOUNTER — Telehealth: Payer: Self-pay | Admitting: Neurology

## 2021-03-20 ENCOUNTER — Encounter: Payer: Self-pay | Admitting: Hematology and Oncology

## 2021-03-20 DIAGNOSIS — G35 Multiple sclerosis: Secondary | ICD-10-CM

## 2021-03-20 LAB — CBC WITH DIFFERENTIAL/PLATELET
Basophils Absolute: 0.1 10*3/uL (ref 0.0–0.2)
Basos: 1 %
EOS (ABSOLUTE): 0.1 10*3/uL (ref 0.0–0.4)
Eos: 1 %
Hematocrit: 41 % (ref 34.0–46.6)
Hemoglobin: 14 g/dL (ref 11.1–15.9)
Immature Grans (Abs): 0 10*3/uL (ref 0.0–0.1)
Immature Granulocytes: 0 %
Lymphocytes Absolute: 0.6 10*3/uL — ABNORMAL LOW (ref 0.7–3.1)
Lymphs: 12 %
MCH: 31.4 pg (ref 26.6–33.0)
MCHC: 34.1 g/dL (ref 31.5–35.7)
MCV: 92 fL (ref 79–97)
Monocytes Absolute: 0.5 10*3/uL (ref 0.1–0.9)
Monocytes: 9 %
Neutrophils Absolute: 4 10*3/uL (ref 1.4–7.0)
Neutrophils: 77 %
Platelets: 237 10*3/uL (ref 150–450)
RBC: 4.46 x10E6/uL (ref 3.77–5.28)
RDW: 13.5 % (ref 11.7–15.4)
WBC: 5.2 10*3/uL (ref 3.4–10.8)

## 2021-03-20 LAB — COMPREHENSIVE METABOLIC PANEL
ALT: 27 IU/L (ref 0–32)
AST: 25 IU/L (ref 0–40)
Albumin/Globulin Ratio: 2 (ref 1.2–2.2)
Albumin: 4.3 g/dL (ref 3.8–4.9)
Alkaline Phosphatase: 101 IU/L (ref 44–121)
BUN/Creatinine Ratio: 16 (ref 9–23)
BUN: 12 mg/dL (ref 6–24)
Bilirubin Total: 0.5 mg/dL (ref 0.0–1.2)
CO2: 26 mmol/L (ref 20–29)
Calcium: 8.7 mg/dL (ref 8.7–10.2)
Chloride: 101 mmol/L (ref 96–106)
Creatinine, Ser: 0.75 mg/dL (ref 0.57–1.00)
Globulin, Total: 2.2 g/dL (ref 1.5–4.5)
Glucose: 67 mg/dL (ref 65–99)
Potassium: 4.1 mmol/L (ref 3.5–5.2)
Sodium: 140 mmol/L (ref 134–144)
Total Protein: 6.5 g/dL (ref 6.0–8.5)
eGFR: 92 mL/min/{1.73_m2} (ref >59)

## 2021-03-20 NOTE — Telephone Encounter (Signed)
I was going to discuss:  Your lymphocytes are still a little low -- I would like you to take tecfidera (dimethyl fumarate) twice a day every other day alternating with once a day every other day  (twice-once-twice-once...)    then we can recheck the lymphocytes in 2 months.   I will place the order and you can come in around the end of July to our office during business hours Monday-Thursday  If it is still low at that time, we will switch.     Got voicemail and left message.

## 2021-03-20 NOTE — Telephone Encounter (Signed)
I spoke to Debbie Watts.  She will start taking twice a day alternating with once a day and we will recheck clinical sites in about 2 months.

## 2021-03-23 ENCOUNTER — Other Ambulatory Visit: Payer: Self-pay | Admitting: Internal Medicine

## 2021-03-26 NOTE — Telephone Encounter (Signed)
Front desk--please arrange an appointment with one of our providers at pt's  earliest convenience for hypertension and lab check. Thank you

## 2021-04-02 ENCOUNTER — Encounter: Payer: Medicare HMO | Admitting: Internal Medicine

## 2021-04-08 ENCOUNTER — Encounter: Payer: Self-pay | Admitting: *Deleted

## 2021-04-14 ENCOUNTER — Other Ambulatory Visit: Payer: Self-pay | Admitting: Neurology

## 2021-04-14 ENCOUNTER — Other Ambulatory Visit: Payer: Self-pay | Admitting: Internal Medicine

## 2021-04-14 DIAGNOSIS — I10 Essential (primary) hypertension: Secondary | ICD-10-CM

## 2021-04-14 DIAGNOSIS — G35 Multiple sclerosis: Secondary | ICD-10-CM

## 2021-04-26 ENCOUNTER — Other Ambulatory Visit: Payer: Self-pay | Admitting: Internal Medicine

## 2021-04-28 ENCOUNTER — Encounter: Payer: Self-pay | Admitting: *Deleted

## 2021-04-30 NOTE — Telephone Encounter (Signed)
Uncertain as to why this patient is on this medication. It doesn't seem to be associated with any diagnosis nor does it appear in any notes. I recommend an office visit to further discuss this and will be declining the script.

## 2021-05-11 ENCOUNTER — Telehealth: Payer: Self-pay | Admitting: Neurology

## 2021-05-11 ENCOUNTER — Other Ambulatory Visit (INDEPENDENT_AMBULATORY_CARE_PROVIDER_SITE_OTHER): Payer: Self-pay

## 2021-05-11 DIAGNOSIS — G35 Multiple sclerosis: Secondary | ICD-10-CM

## 2021-05-11 DIAGNOSIS — Z0289 Encounter for other administrative examinations: Secondary | ICD-10-CM

## 2021-05-11 NOTE — Telephone Encounter (Signed)
Called patient back and advised that after talking with Dr. Felecia Shelling, he does not feel that the bruising is coming from the medication.  Advised the patient to continue to monitor if bruising continues, and if it does may need to follow-up with primary care. Pt verbalized understanding. Pt had no questions at this time but was encouraged to call back if questions arise.

## 2021-05-11 NOTE — Telephone Encounter (Signed)
Pt called, have a bruise on right hand, do not know how I got it. Want to know if a side effect of Dimethyl Fumarate 240 MG CPDR . Would like a call from the nurse.

## 2021-05-12 LAB — CBC WITH DIFFERENTIAL/PLATELET
Basophils Absolute: 0.1 10*3/uL (ref 0.0–0.2)
Basos: 1 %
EOS (ABSOLUTE): 0.1 10*3/uL (ref 0.0–0.4)
Eos: 2 %
Hematocrit: 41.1 % (ref 34.0–46.6)
Hemoglobin: 13.3 g/dL (ref 11.1–15.9)
Immature Grans (Abs): 0 10*3/uL (ref 0.0–0.1)
Immature Granulocytes: 1 %
Lymphocytes Absolute: 0.7 10*3/uL (ref 0.7–3.1)
Lymphs: 17 %
MCH: 31.4 pg (ref 26.6–33.0)
MCHC: 32.4 g/dL (ref 31.5–35.7)
MCV: 97 fL (ref 79–97)
Monocytes Absolute: 0.5 10*3/uL (ref 0.1–0.9)
Monocytes: 12 %
Neutrophils Absolute: 2.9 10*3/uL (ref 1.4–7.0)
Neutrophils: 67 %
Platelets: 206 10*3/uL (ref 150–450)
RBC: 4.24 x10E6/uL (ref 3.77–5.28)
RDW: 13.1 % (ref 11.7–15.4)
WBC: 4.2 10*3/uL (ref 3.4–10.8)

## 2021-05-28 ENCOUNTER — Other Ambulatory Visit: Payer: Self-pay | Admitting: Internal Medicine

## 2021-05-28 DIAGNOSIS — I10 Essential (primary) hypertension: Secondary | ICD-10-CM

## 2021-05-28 DIAGNOSIS — F339 Major depressive disorder, recurrent, unspecified: Secondary | ICD-10-CM

## 2021-05-31 ENCOUNTER — Other Ambulatory Visit: Payer: Self-pay | Admitting: Internal Medicine

## 2021-06-15 ENCOUNTER — Other Ambulatory Visit: Payer: Self-pay | Admitting: Hematology and Oncology

## 2021-06-15 DIAGNOSIS — Z1231 Encounter for screening mammogram for malignant neoplasm of breast: Secondary | ICD-10-CM

## 2021-06-16 NOTE — Assessment & Plan Note (Deleted)
11/21/2018:Screening detected right breast mass upper outer quadrant, in addition to areas of calcifications which were not biopsied. By ultrasound she had 2 breast masses 12 o'clock position 2.9 cm: Grade 2 IDC with high-grade DCIS ER 100%, PR 70%, Ki-67 10%, HER-2 3+ by IHC and 11 o'clock position 1 cm, ER 90%, PR 50%, Ki-67 15%, HER-2 3+ by IHC, T2N0 stage Ib  Neoadjuvant chemotherapy with TCH Perjeta x6 cycles 12/12/2018-03/27/2019  04/30/2019:Right mastectomy: Grade 2 IDC, 3.9 cm, high-grade DCIS, margins are negative, 0/2 lymph nodes negative, ER 95%, PR 80%, HER-2 2+ equivocal, T2 N0  Treatment plan: 1.Adjuvant Kadcylastarted 05/24/2019- 12/12/19 2.Adjuvant antiestrogen therapy with anastrozole 1 mg p.o. daily x7 yearsstarted 05/24/2019 ----------------------------------------------------------------------------------------------------------------------------- Low back pain with pain radiating down the leg:On Percocets 09/24/2019: MRI lumbar spine: Negative for metastatic disease. Mild degenerative changes withoutdisc protrusion or stenosis 09/24/2019: Bone scan: Nonspecific uptake right 11th rib  Anastrozole toxicities: Denies any hot flashes or myalgias. Shingles: 2/26/21CompletedValtrex  Breast cancer surveillance: 1. left breast MRI 01/10/2020: No MRI evidence of malignancy in the left breast, status post right mastectomy. 2. breast exam 06/17/2021: Benign 3.  Mammogram left breast scheduled for 07/07/2021   Brain MRI 02/19/2020: Multiple foci of chronic demyelinating plaque associated with multiple sclerosis  Return to clinic in 1 year for follow-up

## 2021-06-17 ENCOUNTER — Inpatient Hospital Stay: Payer: Medicare HMO | Admitting: Hematology and Oncology

## 2021-06-17 DIAGNOSIS — Z170 Estrogen receptor positive status [ER+]: Secondary | ICD-10-CM

## 2021-06-23 ENCOUNTER — Other Ambulatory Visit: Payer: Self-pay | Admitting: Neurology

## 2021-06-23 ENCOUNTER — Other Ambulatory Visit: Payer: Self-pay | Admitting: Student

## 2021-06-23 DIAGNOSIS — G35 Multiple sclerosis: Secondary | ICD-10-CM

## 2021-06-23 NOTE — Telephone Encounter (Signed)
Last visit 07/10/2020 Next appt scheduled forl 09/13 w/pcp Will send to appropriate team for review

## 2021-07-07 ENCOUNTER — Encounter: Payer: Medicare HMO | Admitting: Internal Medicine

## 2021-07-07 ENCOUNTER — Ambulatory Visit
Admission: RE | Admit: 2021-07-07 | Discharge: 2021-07-07 | Disposition: A | Discharge status: Home / Self Care | Payer: Medicare HMO | Source: Ambulatory Visit | Attending: Hematology and Oncology | Admitting: Hematology and Oncology

## 2021-07-07 ENCOUNTER — Other Ambulatory Visit: Payer: Self-pay

## 2021-07-07 DIAGNOSIS — Z1231 Encounter for screening mammogram for malignant neoplasm of breast: Secondary | ICD-10-CM

## 2021-07-07 IMAGING — MG DIGITAL SCREENING UNILAT LEFT W/ TOMO W/ CAD
6 series · 6 of 18 positions shown · non-contrast
Comparison: Previous exam(s).

CLINICAL DATA: Screening.

EXAM:
DIGITAL SCREENING UNILATERAL LEFT MAMMOGRAM WITH CAD AND
TOMOSYNTHESIS
TECHNIQUE: Left screening digital craniocaudal and mediolateral oblique
mammograms were obtained. Left screening digital breast
tomosynthesis was performed. The images were evaluated with
computer-aided detection.

[L MLO synth-2D (1 of 2)]
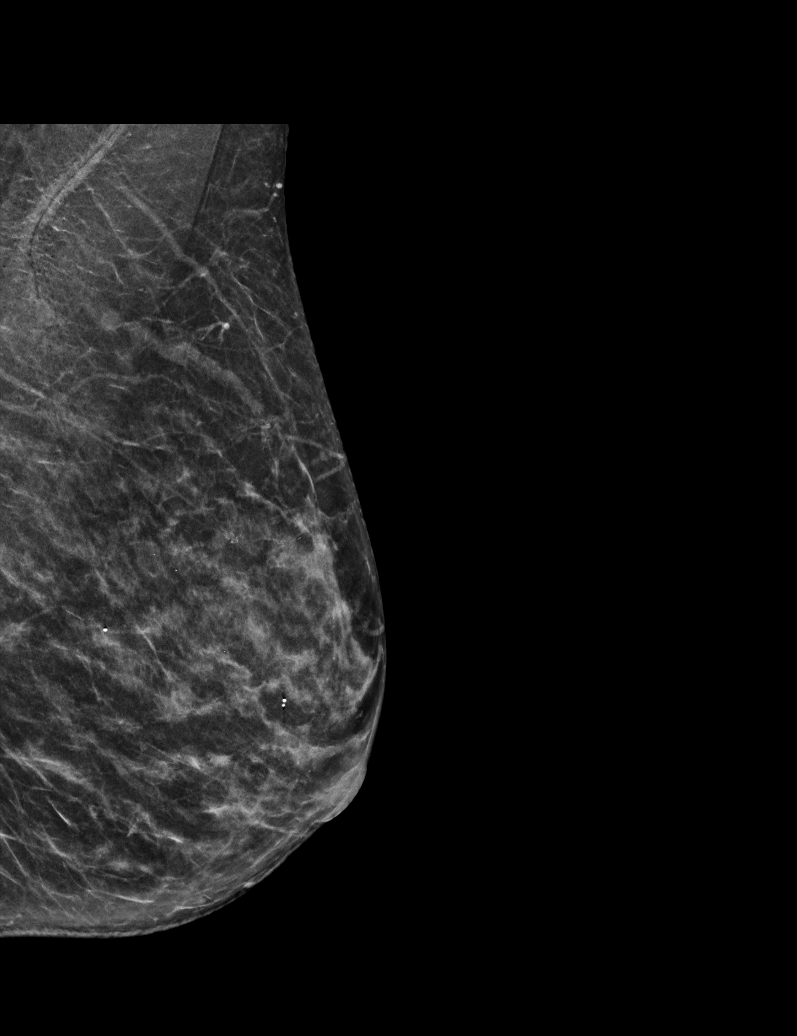

[L MLO synth-2D (2 of 2)]
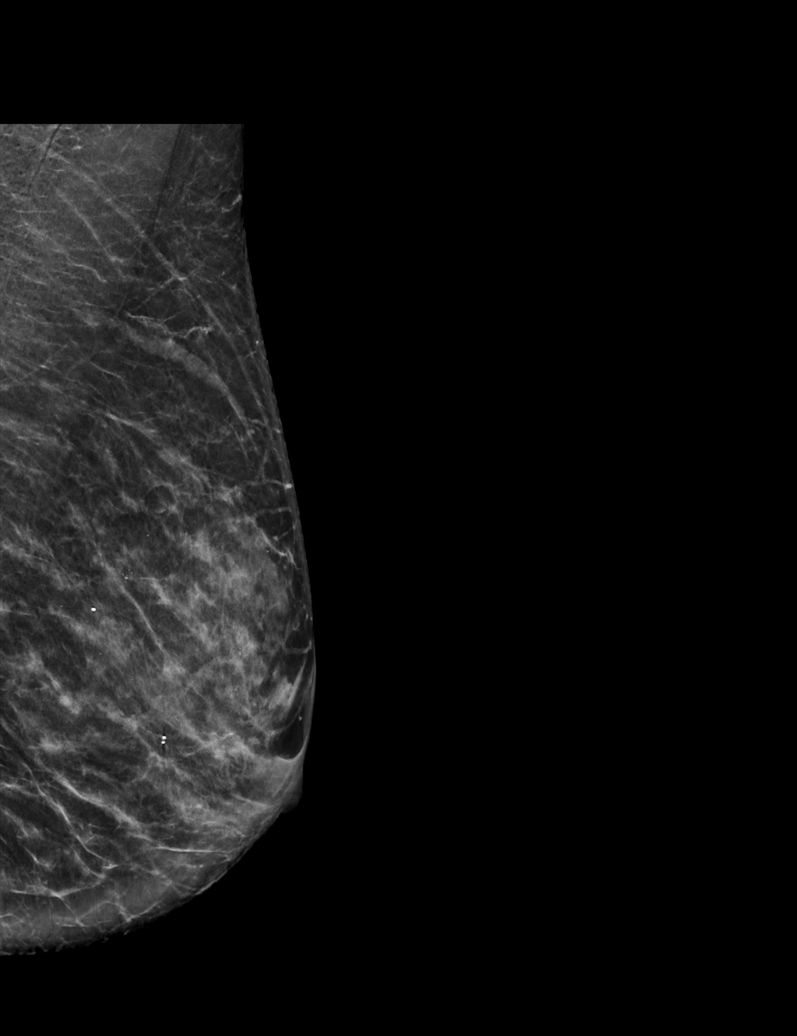

[L CC synth-2D]
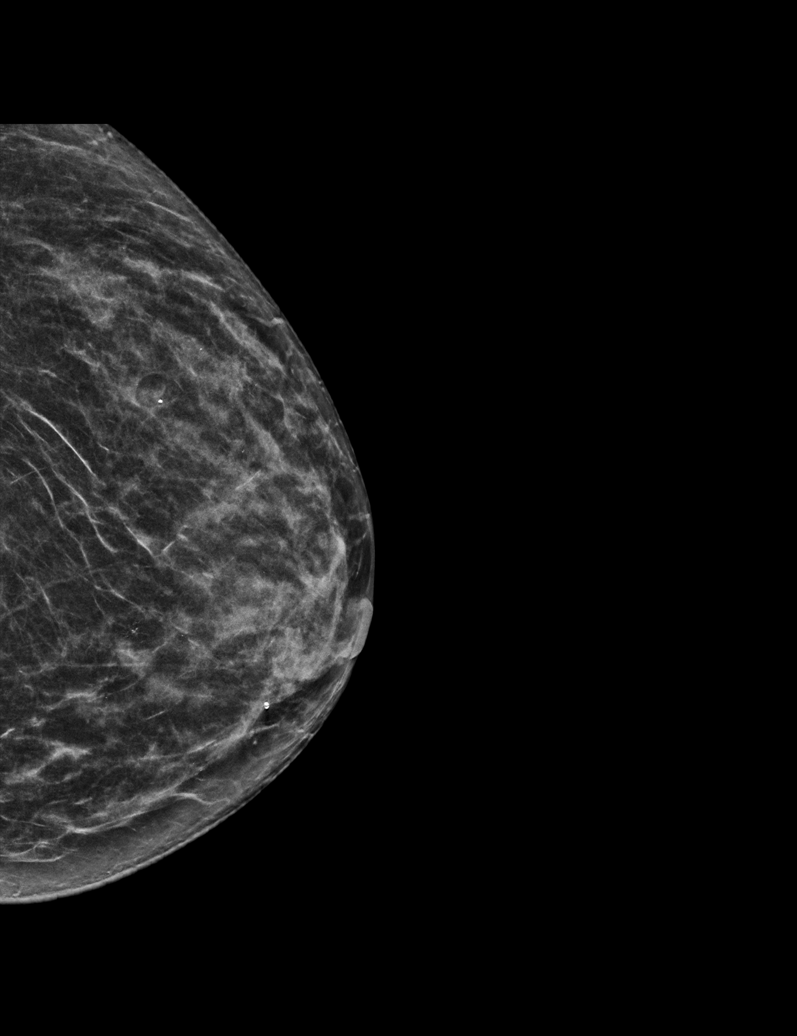

[L MLO tomo (1 of 2) · tomo slice 26/51.0]
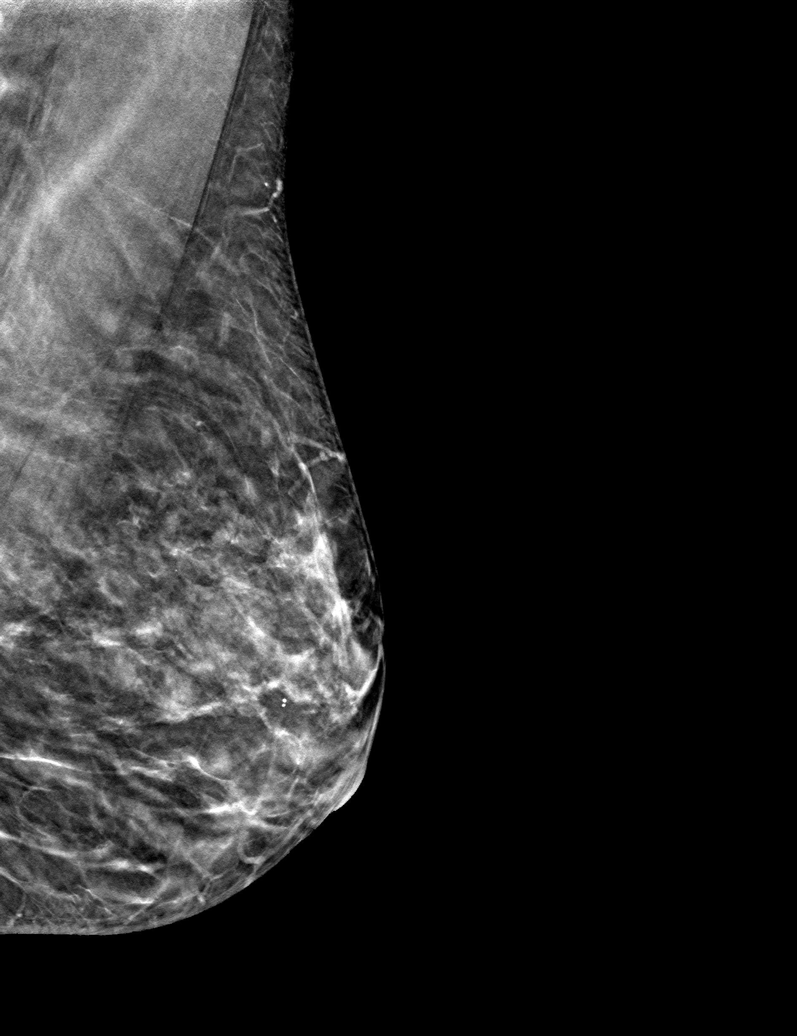

[L MLO tomo (2 of 2) · tomo slice 31/61.0]
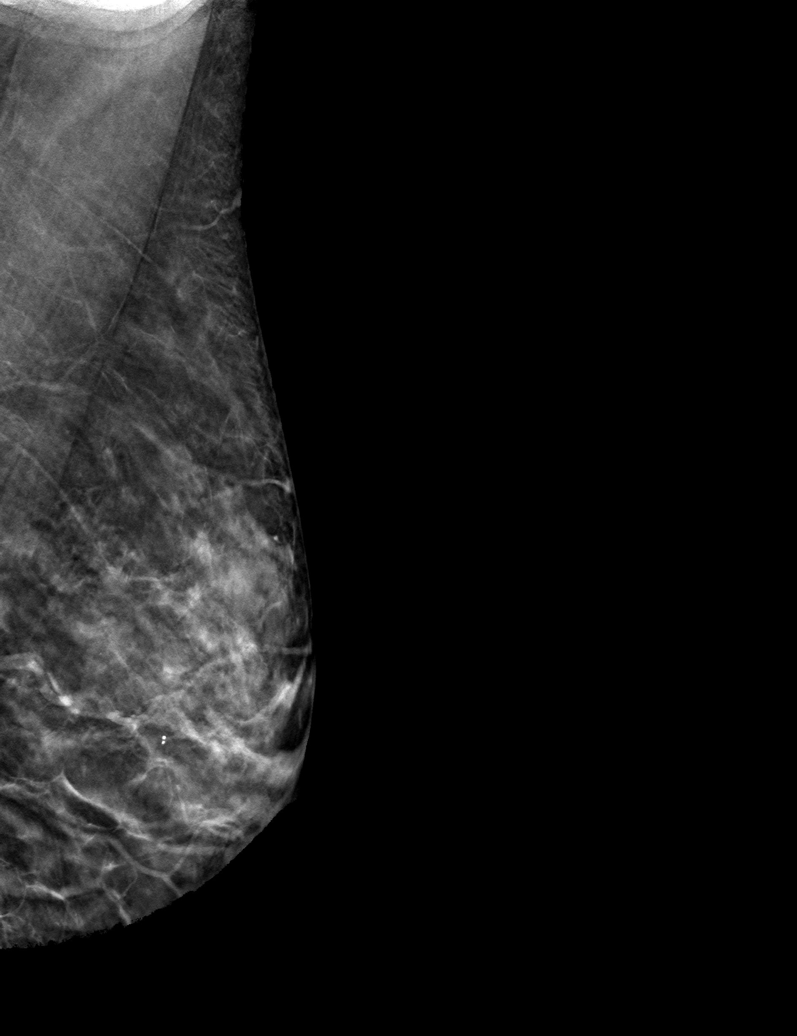

[L CC tomo · tomo slice 25/49.0]
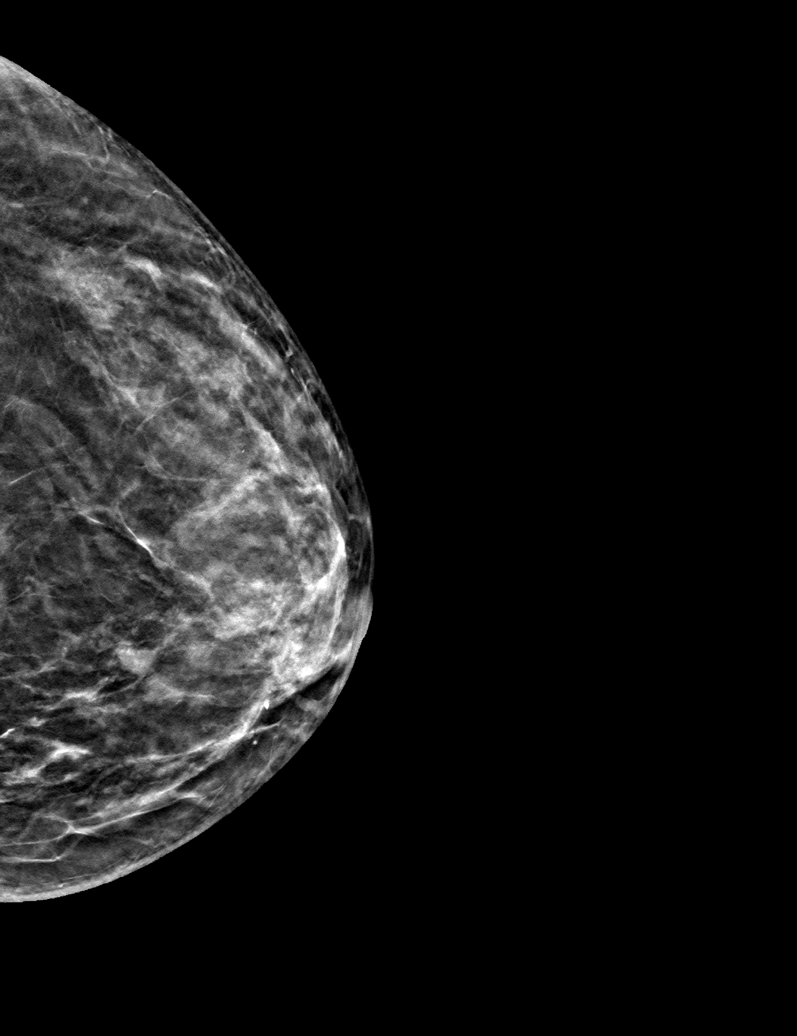

[6 of 18 positions shown; findings below may reference images not displayed]

ACR Breast Density Category c: The breast tissue is heterogeneously
dense, which may obscure small masses.
FINDINGS: The patient has had a right mastectomy. There are no findings
suspicious for malignancy.
IMPRESSION: No mammographic evidence of malignancy. A result letter of this
screening mammogram will be mailed directly to the patient.

RECOMMENDATION:
Screening mammogram in one year.  (Code:[S3])

BI-RADS CATEGORY  1: Negative.

## 2021-07-09 NOTE — Progress Notes (Signed)
Patient Care Team: Dorethea Clan, DO as PCP - General Excell Seltzer, MD (Inactive) as Consulting Physician (General Surgery) Nicholas Lose, MD as Consulting Physician (Hematology and Oncology) Kyung Rudd, MD as Consulting Physician (Radiation Oncology)  DIAGNOSIS:    ICD-10-CM   1. Malignant neoplasm of upper-outer quadrant of right breast in female, estrogen receptor positive ([place])  C50.411    Z17.0       SUMMARY OF ONCOLOGIC HISTORY: Oncology History  Malignant neoplasm of upper-outer quadrant of right breast in female, estrogen receptor positive ([place])  11/21/2018 Initial Diagnosis   Screening detected right breast mass upper outer quadrant, in addition to areas of calcifications which were not biopsied.  By ultrasound she had 2 breast masses 12 o'clock position 2.9 cm: Grade 2 IDC with high-grade DCIS ER 100%, PR 70%, Ki-67 10%, HER-2 3+ by IHC and 11 o'clock position 1 cm, ER 90%, PR 50%, Ki-67 15%, HER-2 3+ by IHC, T2N0 stage Ib   11/29/2018 Cancer Staging   Staging form: Breast, AJCC 8th Edition - Clinical stage from 11/29/2018: Stage IB (cT2, cN0, cM0, G3, ER+, PR+, HER2+) - Signed by Nicholas Lose, MD on 11/29/2018   12/12/2018 -  Neo-Adjuvant Chemotherapy   Neoadjuvant chemotherapy with [facility] Perjeta   01/18/2019 - 01/20/2019 Hospital Admission   Syncope   04/30/2019 Surgery   Right mastectomy: Grade 2 IDC, 3.9 cm, high-grade DCIS, margins are negative, 0/2 lymph nodes negative, ER 95%, PR 80%, HER-2 2+ equivocal, T2 N0   04/30/2019 Cancer Staging   Staging form: Breast, AJCC 8th Edition - Pathologic stage from 04/30/2019: No Stage Recommended (ypT2, pN0, cM0, G2, ER+, PR+, HER2-) - Signed by Gardenia Phlegm, NP on 05/09/2019   05/24/2019 - 12/12/2019 Chemotherapy   Adjuvant Kadcyla   05/24/2019 -  Anti-estrogen oral therapy   Anastrozole 1 mg daily     CHIEF COMPLIANT:  Follow-up of right breast cancer on anastrozole  INTERVAL HISTORY: Debbie Watts is a 60  y.o. with above-mentioned history of  right breast cancer who completed neoadjuvant chemotherapy with [place], underwent a right mastectomy, completed adjuvant Kadcyla, and is currently on anti-estrogen therapy with anastrozole. She presents to the clinic today for follow-up.  She denies any pain lumps or nodules in the breast.  She does have some depression issues related to her boyfriend's health condition.  Her boyfriend has bilateral lower extremity amputations.  ALLERGIES:  is allergic to procaine.  MEDICATIONS:  Current Outpatient Medications  Medication Sig Dispense Refill   acetaminophen-codeine (TYLENOL #3) 300-30 MG tablet Take 1 tablet by mouth 3 (three) times daily as needed for moderate pain. 30 tablet 0   anastrozole (ARIMIDEX) 1 MG tablet TAKE 1 TABLET BY MOUTH EVERY DAY 90 tablet 3   aspirin-acetaminophen-caffeine (EXCEDRIN MIGRAINE) 756-433-29 MG tablet Take 2 tablets by mouth every 6 (six) hours as needed for migraine.     betamethasone valerate lotion (VALISONE) 0.1 % Apply 1 application topically daily as needed for irritation.      carbidopa-levodopa (SINEMET IR) 10-100 MG tablet TAKE 1 TABLET BY MOUTH EVERYDAY AT BEDTIME 90 tablet 3   diclofenac (VOLTAREN) 75 MG EC tablet TAKE 1 TABLET BY MOUTH TWICE A DAY 60 tablet 0   Dimethyl Fumarate 240 MG CPDR Take by mouth. Patient taking differently: Due to low lymphocyte count: Take twice a day 4 days a week and once a day 3 days a week (for example could do Monday Wednesday Friday 1 pill and Tuesday Thursday Saturday  Sunday 2 pills     diphenhydrAMINE (BENADRYL) 25 MG tablet Take 25 mg by mouth at bedtime as needed.      fluconazole (DIFLUCAN) 100 MG tablet TAKE 1 TABLET BY MOUTH EVERY DAY 30 tablet 2   fluticasone (FLONASE) 50 MCG/ACT nasal spray PLACE 2 SPRAYS INTO BOTH NOSTRILS AT BEDTIME. 48 mL 2   gabapentin (NEURONTIN) 300 MG capsule TAKE 2 CAPSULES (600 MG TOTAL) BY MOUTH 2 (TWO) TIMES DAILY. 360 capsule 3   lisinopril  (ZESTRIL) 5 MG tablet TAKE 1 TABLET BY MOUTH EVERY DAY 90 tablet 1   methocarbamol (ROBAXIN) 500 MG tablet Take 1 tablet (500 mg total) by mouth every 8 (eight) hours as needed for muscle spasms. 20 tablet 1   Multiple Vitamin (MULTIVITAMIN WITH MINERALS) TABS tablet Take 2 tablets by mouth daily.     omeprazole (PRILOSEC OTC) 20 MG tablet Take 20 mg by mouth daily as needed (acid reflux).     PARoxetine (PAXIL) 20 MG tablet TAKE 1 TABLET BY MOUTH EVERY DAY 90 tablet 1   traMADol (ULTRAM) 50 MG tablet TAKE 1 TABLET BY MOUTH TWICE A DAY AS NEEDED 60 tablet 5   valACYclovir (VALTREX) 1000 MG tablet Take 1 tablet (1,000 mg total) by mouth 2 (two) times daily. 14 tablet 0   VENTOLIN HFA 108 (90 Base) MCG/ACT inhaler INHALE 1-2 PUFFS INTO THE LUNGS EVERY 6 (SIX) HOURS AS NEEDED FOR WHEEZING OR SHORTNESS OF BREATH. 54 g 1   No current facility-administered medications for this visit.    PHYSICAL EXAMINATION: ECOG PERFORMANCE STATUS: 1 - Symptomatic but completely ambulatory  Vitals:   07/10/21 1138  BP: (!) 142/67  Pulse: 71  Resp: 18  Temp: 97.9 F (36.6 C)  SpO2: 98%   Filed Weights   07/10/21 1138  Weight: 116 lb 1.6 oz (52.7 kg)    BREAST: No palpable masses or nodules in either right or left breasts. No palpable axillary supraclavicular or infraclavicular adenopathy no breast tenderness or nipple discharge. (exam performed in the presence of a chaperone)  LABORATORY DATA:  I have reviewed the data as listed CMP Latest Ref Rng & Units 03/19/2021 06/02/2020 12/12/2019  Glucose 65 - 99 mg/dL 67 94 93  BUN 6 - 24 mg/dL _0 Creatinine 0.57 - 1.00 mg/dL 0.75 0.67 0.68  Sodium 134 - 144 mmol/L 140 142 141  Potassium 3.5 - 5.2 mmol/L 4.1 4.6 3.7  Chloride 96 - 106 mmol/L 101 100 103  CO2 20 - 29 mmol/L _1 Calcium 8.7 - 10.2 mg/dL 8.7 8.9 8.9  Total Protein 6.0 - 8.5 g/dL 6.5 - 7.3  Total Bilirubin 0.0 - 1.2 mg/dL 0.5 - 0.4  Alkaline Phos 44 - 121 IU/L 101 - 85  AST 0 - 40  IU/L 25 - 35  ALT 0 - 32 IU/L 27 - 44    Lab Results  Component Value Date   WBC 4.2 05/11/2021   HGB 13.3 05/11/2021   HCT 41.1 05/11/2021   MCV 97 05/11/2021   PLT 206 05/11/2021   NEUTROABS 2.9 05/11/2021    ASSESSMENT & PLAN:  Malignant neoplasm of upper-outer quadrant of right breast in female, estrogen receptor positive ([place]) 11/21/2018:Screening detected right breast mass upper outer quadrant, in addition to areas of calcifications which were not biopsied.  By ultrasound she had 2 breast masses 12 o'clock position 2.9 cm: Grade 2 IDC with high-grade DCIS ER 100%, PR 70%, Ki-67 10%, HER-2 3+ by  IHC and 11 o'clock position 1 cm, ER 90%, PR 50%, Ki-67 15%, HER-2 3+ by IHC, T2N0 stage Ib   Neoadjuvant chemotherapy with [facility] Perjeta x6 cycles 12/12/2018-03/27/2019   04/30/2019: Right mastectomy: Grade 2 IDC, 3.9 cm, high-grade DCIS, margins are negative, 0/2 lymph nodes negative, ER 95%, PR 80%, HER-2 2+ equivocal, T2 N0   Treatment plan: 1.  Adjuvant Kadcyla started 05/24/2019- 12/12/19 2.  Adjuvant antiestrogen therapy with anastrozole 1 mg p.o. daily x7 years started 05/24/2019  ----------------------------------------------------------------------------------------------------------------------------- Low back pain with pain radiating down the leg: On Percocets 09/24/2019: MRI lumbar spine: Negative for metastatic disease.  Mild degenerative changes without disc protrusion or stenosis 09/24/2019: Bone scan: Nonspecific uptake right 11th rib   Anastrozole toxicities: Denies any hot flashes or myalgias. Shingles: 12/21/19 Completed Valtrex   Breast cancer surveillance: 1. left breast MRI  01/10/2020: No MRI evidence of malignancy in the left breast, status post right mastectomy. 2. breast exam 07/10/2021: Benign 3.  Mammogram left breast 07/17/2021     Brain MRI 02/19/2020: Multiple foci of chronic demyelinating plaque associated with multiple sclerosis   Return to clinic in 1 year for  follow-up    No orders of the defined types were placed in this encounter.  The patient has a good understanding of the overall plan. she agrees with it. she will call with any problems that may develop before the next visit here.  Total time spent: 20 mins including face to face time and time spent for planning, charting and coordination of care  Rulon Eisenmenger, MD, MPH 07/10/2021  I, Thana Ates, am acting as scribe for Dr. Nicholas Lose.  I have reviewed the above documentation for accuracy and completeness, and I agree with the above.

## 2021-07-10 ENCOUNTER — Inpatient Hospital Stay: Payer: Medicare HMO | Attending: Hematology and Oncology | Admitting: Hematology and Oncology

## 2021-07-10 ENCOUNTER — Other Ambulatory Visit: Payer: Self-pay

## 2021-07-10 DIAGNOSIS — Z79899 Other long term (current) drug therapy: Secondary | ICD-10-CM | POA: Insufficient documentation

## 2021-07-10 DIAGNOSIS — Z79811 Long term (current) use of aromatase inhibitors: Secondary | ICD-10-CM | POA: Insufficient documentation

## 2021-07-10 DIAGNOSIS — C50411 Malignant neoplasm of upper-outer quadrant of right female breast: Secondary | ICD-10-CM | POA: Insufficient documentation

## 2021-07-10 DIAGNOSIS — Z9011 Acquired absence of right breast and nipple: Secondary | ICD-10-CM | POA: Diagnosis not present

## 2021-07-10 DIAGNOSIS — Z170 Estrogen receptor positive status [ER+]: Secondary | ICD-10-CM | POA: Diagnosis not present

## 2021-07-10 DIAGNOSIS — J449 Chronic obstructive pulmonary disease, unspecified: Secondary | ICD-10-CM | POA: Diagnosis not present

## 2021-07-10 MED ORDER — ANASTROZOLE 1 MG PO TABS: 1.0000 mg | ORAL_TABLET | Freq: Every day | ORAL | 3 refills | Status: DC

## 2021-07-10 NOTE — Assessment & Plan Note (Signed)
11/21/2018:Screening detected right breast mass upper outer quadrant, in addition to areas of calcifications which were not biopsied. By ultrasound she had 2 breast masses 12 o'clock position 2.9 cm: Grade 2 IDC with high-grade DCIS ER 100%, PR 70%, Ki-67 10%, HER-2 3+ by IHC and 11 o'clock position 1 cm, ER 90%, PR 50%, Ki-67 15%, HER-2 3+ by IHC, T2N0 stage Ib  Neoadjuvant chemotherapy with [facility] x6 cycles 12/12/2018-03/27/2019  04/30/2019:Right mastectomy: Grade 2 IDC, 3.9 cm, high-grade DCIS, margins are negative, 0/2 lymph nodes negative, ER 95%, PR 80%, HER-2 2+ equivocal, T2 N0  Treatment plan: 1.Adjuvant Kadcylastarted 05/24/2019- 12/12/19 2.Adjuvant antiestrogen therapy with anastrozole 1 mg p.o. daily x7 yearsstarted 05/24/2019 ----------------------------------------------------------------------------------------------------------------------------- Low back pain with pain radiating down the leg:On Percocets 09/24/2019: MRI lumbar spine: Negative for metastatic disease. Mild degenerative changes withoutdisc protrusion or stenosis 09/24/2019: Bone scan: Nonspecific uptake right 11th rib  Anastrozole toxicities: Denies any hot flashes or myalgias. Shingles: 2/26/21CompletedValtrex  Breast cancer surveillance: 1. left breast MRI 01/10/2020: No MRI evidence of malignancy in the left breast, status post right mastectomy. 2. breast exam 07/10/2021: Benign 3.  Mammogram left breast 07/17/2021    Brain MRI 02/19/2020: Multiple foci of chronic demyelinating plaque associated with multiple sclerosis  Return to clinic in 1 year for follow-up 

## 2021-07-14 ENCOUNTER — Other Ambulatory Visit: Payer: Self-pay | Admitting: Student

## 2021-07-20 ENCOUNTER — Encounter: Payer: Self-pay | Admitting: Internal Medicine

## 2021-07-20 NOTE — Telephone Encounter (Signed)
Patient sent letter asking to call the clinic to schedule an appointment.

## 2021-07-30 ENCOUNTER — Encounter: Payer: Self-pay | Admitting: Hematology and Oncology

## 2021-08-05 ENCOUNTER — Ambulatory Visit (INDEPENDENT_AMBULATORY_CARE_PROVIDER_SITE_OTHER): Payer: Medicare Other | Admitting: Internal Medicine

## 2021-08-05 ENCOUNTER — Other Ambulatory Visit (HOSPITAL_COMMUNITY)
Admission: RE | Admit: 2021-08-05 | Discharge: 2021-08-05 | Disposition: A | Discharge status: Home / Self Care | Payer: Medicare Other | Source: Ambulatory Visit | Attending: Internal Medicine | Admitting: Internal Medicine

## 2021-08-05 VITALS — BP 134/81 | HR 88 | Temp 98.6°F | Wt 116.6 lb

## 2021-08-05 DIAGNOSIS — G8929 Other chronic pain: Secondary | ICD-10-CM

## 2021-08-05 DIAGNOSIS — F339 Major depressive disorder, recurrent, unspecified: Secondary | ICD-10-CM

## 2021-08-05 DIAGNOSIS — Z0000 Encounter for general adult medical examination without abnormal findings: Secondary | ICD-10-CM | POA: Diagnosis not present

## 2021-08-05 DIAGNOSIS — M546 Pain in thoracic spine: Secondary | ICD-10-CM

## 2021-08-05 DIAGNOSIS — Z01419 Encounter for gynecological examination (general) (routine) without abnormal findings: Secondary | ICD-10-CM | POA: Diagnosis present

## 2021-08-05 DIAGNOSIS — I10 Essential (primary) hypertension: Secondary | ICD-10-CM | POA: Diagnosis not present

## 2021-08-05 DIAGNOSIS — Z23 Encounter for immunization: Secondary | ICD-10-CM

## 2021-08-05 DIAGNOSIS — Z1151 Encounter for screening for human papillomavirus (HPV): Secondary | ICD-10-CM | POA: Insufficient documentation

## 2021-08-05 DIAGNOSIS — F329 Major depressive disorder, single episode, unspecified: Secondary | ICD-10-CM

## 2021-08-05 MED ORDER — CYCLOBENZAPRINE HCL 5 MG PO TABS: 5.0000 mg | ORAL_TABLET | Freq: Three times a day (TID) | ORAL | 0 refills | Status: DC | PRN

## 2021-08-05 NOTE — Patient Instructions (Addendum)
Ms Debbie Watts,  It was a pleasure seeing you in clinic. Today we discussed:   Depression:  I will send a referral to our therapist. Please schedule an appointment to talk to her at your convenience.   Back pain: At this time, you may discontinue diclofenac and robaxin and continue with tramadol and flexeril as needed.  Continue to take all other medications as prescribed.  If you have any questions or concerns, please call our clinic at [phone] between 9am-5pm and after hours call [phone] and ask for the internal medicine resident on call. If you feel you are having a medical emergency please call 911.   Thank you, we look forward to helping you remain healthy!

## 2021-08-06 DIAGNOSIS — F32A Depression, unspecified: Secondary | ICD-10-CM | POA: Insufficient documentation

## 2021-08-06 NOTE — Progress Notes (Signed)
   CC: depression/anxiety, pap smear   HPI:  [name] is a 60 y.o. female with PMHx as stated below presenting for follow up of depression/anxiety and for pap smear. Please see problem based charting for complete assessment and plan.  Past Medical History:  Diagnosis Date   Arthritis    Asthma    Cancer ([employer])    chemo for breast ca  x22mos   Depression    Deviated nasal septum    Hearing loss 06/11/2014   Bilateral   Herniated cervical disc    Hypertension    Leukocytosis 01/19/2019   Migraine    Miscarriage    2   MS (multiple sclerosis) ([place])    Shingles 12/24/2019   Urinary tract infection without hematuria 08/10/2018   Review of Systems:  Negative except as stated in HPI.  Physical Exam:  Vitals:   08/05/21 1313  BP: 134/81  Pulse: 88  Temp: 98.6 F (37 C)  TempSrc: Oral  SpO2: 99%  Weight: 116 lb 9.6 oz (52.9 kg)   Physical Exam  Constitutional: Appears well-developed and well-nourished. No distress.  Cardiovascular: Normal rate, regular rhythm, S1 and S2 present, no murmurs, rubs, gallops.  Distal pulses intact Respiratory: No respiratory distress,  Lungs are clear to auscultation bilaterally. GI: Nondistended, soft, nontender to palpation, normal bowel sounds GU: Normal external genitalia without lesions; vaginal canal and cervix nonerythematous, no abnormal discharge Musculoskeletal: Normal bulk and tone. Neurological: Is alert and oriented x4, no apparent focal deficits noted. Skin: Warm and dry.  No rash, erythema, lesions noted. Psych: Normal mood and behavior; slightly anxious  Assessment & Plan:   See Encounters Tab for problem based charting.  Patient discussed with Dr. Dareen Piano

## 2021-08-06 NOTE — Assessment & Plan Note (Signed)
Patient with history of chronic intermittent back pain along the left thoracic paraspinal muscles. She has been functional but reports that pain was previously better controlled on flexeril. She would like to discontinue diclofenac and robaxin at this time. On exam, mild paraspinal muscle tenderness noted along the left thoracic spine.   Plan: Discontinue robaxin and diclofenac Rx for flexeril 5mg  tid prn sent to pharmacy Continue tramadol 50mg  bid prn
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
## 2021-08-06 NOTE — Assessment & Plan Note (Signed)
BP Readings from Last 3 Encounters:  08/05/21 134/81  07/10/21 (!) 142/67  03/19/21 (!) 148/87   Patient is on lisinopril 5mg  daily. Blood pressure wnl at this time. She is asymptomatic.   Plan: Continue lisinopril 5mg  daily

## 2021-08-06 NOTE — Assessment & Plan Note (Signed)
Cervical cancer screening: Pap smear performed at this visit Flu vaccine administered at this visit  Received FIT testing at this visit

## 2021-08-10 NOTE — Progress Notes (Signed)
Internal Medicine Clinic Attending ° °Case discussed with Dr. Aslam  At the time of the visit.  We reviewed the resident’s history and exam and pertinent patient test results.  I agree with the assessment, diagnosis, and plan of care documented in the resident’s note.  °

## 2021-08-11 LAB — CYTOLOGY - PAP
Comment: NEGATIVE
Diagnosis: NEGATIVE
Diagnosis: REACTIVE
High risk HPV: NEGATIVE

## 2021-08-26 ENCOUNTER — Other Ambulatory Visit: Payer: Medicare Other

## 2021-08-26 ENCOUNTER — Institutional Professional Consult (permissible substitution): Payer: Medicare Other | Admitting: Behavioral Health

## 2021-08-26 DIAGNOSIS — Z0000 Encounter for general adult medical examination without abnormal findings: Secondary | ICD-10-CM

## 2021-08-29 LAB — FECAL OCCULT BLOOD, IMMUNOCHEMICAL: Fecal Occult Bld: NEGATIVE

## 2021-09-05 ENCOUNTER — Other Ambulatory Visit: Payer: Self-pay | Admitting: Student

## 2021-09-05 DIAGNOSIS — J449 Chronic obstructive pulmonary disease, unspecified: Secondary | ICD-10-CM

## 2021-09-09 ENCOUNTER — Other Ambulatory Visit: Payer: Self-pay | Admitting: Student

## 2021-09-09 DIAGNOSIS — J449 Chronic obstructive pulmonary disease, unspecified: Secondary | ICD-10-CM

## 2021-09-10 ENCOUNTER — Other Ambulatory Visit: Payer: Self-pay

## 2021-09-10 DIAGNOSIS — J449 Chronic obstructive pulmonary disease, unspecified: Secondary | ICD-10-CM

## 2021-09-10 MED ORDER — VENTOLIN HFA 108 (90 BASE) MCG/ACT IN AERS: 1.0000 | INHALATION_SPRAY | Freq: Four times a day (QID) | RESPIRATORY_TRACT | 0 refills | Status: DC | PRN

## 2021-09-10 NOTE — Telephone Encounter (Signed)
VENTOLIN HFA 108 (90 Base) MCG/ACT inhaler, REFILL REQUEST @ [id number] - [place], [place] - [address].

## 2021-09-14 ENCOUNTER — Other Ambulatory Visit: Payer: Self-pay | Admitting: Student

## 2021-09-14 DIAGNOSIS — I10 Essential (primary) hypertension: Secondary | ICD-10-CM

## 2021-09-14 DIAGNOSIS — F339 Major depressive disorder, recurrent, unspecified: Secondary | ICD-10-CM

## 2021-09-15 ENCOUNTER — Ambulatory Visit: Payer: Medicare Other | Admitting: Behavioral Health

## 2021-09-15 ENCOUNTER — Other Ambulatory Visit: Payer: Self-pay | Admitting: Internal Medicine

## 2021-09-15 DIAGNOSIS — F419 Anxiety disorder, unspecified: Secondary | ICD-10-CM

## 2021-09-15 DIAGNOSIS — F331 Major depressive disorder, recurrent, moderate: Secondary | ICD-10-CM

## 2021-09-15 MED ORDER — CYCLOBENZAPRINE HCL 5 MG PO TABS: 5.0000 mg | ORAL_TABLET | Freq: Three times a day (TID) | ORAL | 0 refills | Status: DC | PRN

## 2021-09-15 NOTE — Telephone Encounter (Signed)
MED REFILL REQUEST  cyclobenzaprine (FLEXERIL) 5 MG tablet   [id number] [address], [place] - [place] Phone:  [phone]  Fax:  [phone]
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
## 2021-09-17 ENCOUNTER — Encounter: Payer: Self-pay | Admitting: Hematology and Oncology

## 2021-09-23 NOTE — Patient Instructions (Addendum)
Below is our plan:  We will continue current treatment plan. Continue alternating daily/every other day dosing of dimethyl fumerate. Continue gabapentin 600mg  BID and carbidopa/levodopa at bedtime. Continue tramadol sparingly for pain. I will give you a few meloxicam for the knee pain. Take 1-2 tablets daily as needed. If pain does not improve, please let your PCP know and see if they can check it out. Continue close follow up with PCP and oncology.   Please make sure you are staying well hydrated. I recommend 50-60 ounces daily. Well balanced diet and regular exercise encouraged. Consistent sleep schedule with 6-8 hours recommended.   Please continue follow up with care team as directed.   Follow up with Dr Felecia Shelling in 6 months   You may receive a survey regarding today's visit. I encourage you to leave honest feed back as I do use this information to improve patient care. Thank you for seeing me today!

## 2021-09-23 NOTE — Progress Notes (Signed)
Chief Complaint  Patient presents with   Follow-up    Pt alone, rm 10. Overall stable. States that she is having pain in rt knee that has been ongoing for couple days. DMT- tecfidera      HISTORY OF PRESENT ILLNESS:  09/24/21 ALL:  Debbie Watts is a 60 y.o. female here today for follow up for RRMS. She continues dimethyl fumerate but alternates twice daily with daily dosing due to low lymphocyte count. Lymph 0.6 in May 2022, increased to 0.7 04/2021 following dose change.   She is doing fairly well. No new or exacerbating symptoms with the exception of mild achy knee pain for the past 2-3 days. She denies known injuries. She denies signs of infection. She feels gait is stable. She doesn't normally need an assistive device but will use a cane if tired. She did have a fall about 4 weeks ago. She tripped over some edging on the ground and fell on her left knee. She denies injury and did not have any significant knee pain following fall. Low back bothers her but she feels it is about the same from last visit. Tramadol helps some days. She has had some right knee pain for the past 2-3 days and reports that Tramadol has not helped.   Gabapentin 600mg  BID and Sinemet at bedtime help RLS symptoms. She feels that she sleeps fine but sometimes has interrupted sleep due to having to care for her boyfriend. He is a double amputee and needs her assistance for mobility.   Mood is stable on Paxil 20mg  daily. She is followed closely by PCP and oncology.    HISTORY (copied from Dr Garth Bigness previous note)  Debbie Watts is a 60 y.o. woman with relapsing remitting multiple sclerosis.   Update 03/19/2021 She feels her MS is mildly worse as she has more trouble with balance - this change was gradual.    She is on Tecfidera and tolerates it well.   No definite exacerbation or new neurologic symptoms.    Last lymphocyte count was 0.6 previously   She is walking about the same with reduced balance. No  recent fall. She can walk one mile in about 20-30 minutes but then needs a rest. She does well with shopping.   As she walks longer, her back hurts more.    Her right leg is weaker than her left and more clumsy.   Arms are more symmetric.   She has dysesthesias in her legs/feet.   Also back pain.   Bladder function is better,   Vision is fine.       Headaches have done better the last few months.    She takes a tramadol when one occurs.  She has frequent headaches arising form the left occiput.    Mood is doing better.   She notes some fatigue.  She sleeps ok most nights but some sleep maintenance insomnia.      She was diagnosed with breast cancer January 2020.   She will be having another mammogram soon.   She is on TCHP (docetaxel, carboplatin, trastuzumab, pertuzumab (Perjeta)).   She has had 5 cycles of chemotherapy and her last infusion will be June 2.      She is on anastrozole now.    She sees Dr.  Lindi Adie   MS history:  She was diagnosed with MS in 1999 based on MRI and lumbar rupture.  At that time she was living in [place] and was seeing Dr.  Zweibel.  When he retired she saw another doctor and then she moved to this area around 2015 but did not seek neurologic referral until 2015.  She was on Avonex for many years but stopped due to insurance.  Then, in November 2016, she started in the drug study and was on ALKS 8700 4 2 years.  The study ended in late 2018 for her and she switched over to [place].       Images: MRI brain showed multiple T2/FLAIR hyperintense foci in the hemispheres in a pattern and configuration consistent with chronic demyelinating plaque associated with multiple sclerosis.  None of the foci appear to be acute.  They do not enhance.  Compared to the MRI dated 08/09/2017, there are no new lesions.   MRI cervical spine showed 3 T2 hyperintense foci within the spinal cord adjacent to C2, C4-C5 and C6-C7.  All of these were present on the 2015 MRI.  They do not enhance.  They  are consistent with chronic demyelinating plaque associated with multiple sclerosis.   Moderate spinal stenosis at C5-C6 and mild spinal stenosis at C4-C5 and C6-C7.  There does not appear to be any nerve root compression at these levels.  Degenerative changes at C5-C6 have mildly progressed compared to the 2015 MRI   REVIEW OF SYSTEMS: Out of a complete 14 system review of symptoms, the patient complains only of the following symptoms, knee pain, low back pain and all other reviewed systems are negative.   ALLERGIES: Allergies  Allergen Reactions   Procaine Shortness Of Breath     HOME MEDICATIONS: Outpatient Medications Prior to Visit  Medication Sig Dispense Refill   acetaminophen-codeine (TYLENOL #3) 300-30 MG tablet Take 1 tablet by mouth 3 (three) times daily as needed for moderate pain. 30 tablet 0   anastrozole (ARIMIDEX) 1 MG tablet Take 1 tablet (1 mg total) by mouth daily. 90 tablet 3   aspirin-acetaminophen-caffeine (EXCEDRIN MIGRAINE) 250-250-65 MG tablet Take 2 tablets by mouth every 6 (six) hours as needed for migraine.     betamethasone valerate lotion (VALISONE) 0.1 % Apply 1 application topically daily as needed for irritation.      carbidopa-levodopa (SINEMET IR) 10-100 MG tablet TAKE 1 TABLET BY MOUTH EVERYDAY AT BEDTIME 90 tablet 3   cyclobenzaprine (FLEXERIL) 5 MG tablet Take 1 tablet (5 mg total) by mouth 3 (three) times daily as needed for muscle spasms. 30 tablet 0   Dimethyl Fumarate 240 MG CPDR Take by mouth. Patient taking differently: Due to low lymphocyte count: Take twice a day 4 days a week and once a day 3 days a week (for example could do Monday Wednesday Friday 1 pill and Tuesday Thursday Saturday Sunday 2 pills     diphenhydrAMINE (BENADRYL) 25 MG tablet Take 25 mg by mouth at bedtime as needed.      fluconazole (DIFLUCAN) 100 MG tablet TAKE 1 TABLET BY MOUTH EVERY DAY 30 tablet 2   fluticasone (FLONASE) 50 MCG/ACT nasal spray PLACE 2 SPRAYS INTO BOTH  NOSTRILS AT BEDTIME. 48 mL 2   gabapentin (NEURONTIN) 300 MG capsule TAKE 2 CAPSULES (600 MG TOTAL) BY MOUTH 2 (TWO) TIMES DAILY. 360 capsule 3   lisinopril (ZESTRIL) 5 MG tablet TAKE 1 TABLET BY MOUTH EVERY DAY 90 tablet 1   Multiple Vitamin (MULTIVITAMIN WITH MINERALS) TABS tablet Take 2 tablets by mouth daily.     omeprazole (PRILOSEC OTC) 20 MG tablet Take 20 mg by mouth daily as needed (acid reflux).  PARoxetine (PAXIL) 20 MG tablet TAKE 1 TABLET BY MOUTH EVERY DAY 90 tablet 1   traMADol (ULTRAM) 50 MG tablet TAKE 1 TABLET BY MOUTH TWICE A DAY AS NEEDED 60 tablet 5   valACYclovir (VALTREX) 1000 MG tablet Take 1 tablet (1,000 mg total) by mouth 2 (two) times daily. 14 tablet 0   VENTOLIN HFA 108 (90 Base) MCG/ACT inhaler Inhale 1-2 puffs into the lungs every 6 (six) hours as needed for wheezing or shortness of breath. 25 each 0   No facility-administered medications prior to visit.     PAST MEDICAL HISTORY: Past Medical History:  Diagnosis Date   Arthritis    Asthma    Cancer ([place])    chemo for breast ca  x19mos   Depression    Deviated nasal septum    Hearing loss 06/11/2014   Bilateral   Herniated cervical disc    Hypertension    Leukocytosis 01/19/2019   Migraine    Miscarriage    2   MS (multiple sclerosis) ([place])    Shingles 12/24/2019   Urinary tract infection without hematuria 08/10/2018     PAST SURGICAL HISTORY: Past Surgical History:  Procedure Laterality Date   BREAST BIOPSY Right 11/21/2018   Malignant   HEMATOMA EVACUATION Right 05/01/2019   Procedure: EVACUATION HEMATOMA;  Surgeon: Rolm Bookbinder, MD;  Location: [facility];  Service: General;  Laterality: Right;   MASTECTOMY Right 04/30/2019   MASTECTOMY W/ SENTINEL NODE BIOPSY Right 04/30/2019   Procedure: RIGHT MASTECTOMY WITH  RIGHT AXILLARY SENTINEL LYMPH NODE BIOPSY INJECT BLUE DYE;  Surgeon: Rolm Bookbinder, MD;  Location: [facility];  Service: General;  Laterality: Right;   NASAL SEPTUM SURGERY      SIMPLE MASTECTOMY WITH AXILLARY SENTINEL NODE BIOPSY Right 04/30/2019   spinal injections       FAMILY HISTORY: Family History  Adopted: Yes  Problem Relation Age of Onset   Cancer Maternal Uncle        throat     SOCIAL HISTORY: Social History   Socioeconomic History   Marital status: Single    Spouse name: Not on file   Number of children: 2   Years of education: 12   Highest education level: Not on file  Occupational History   Occupation: Disabled  Tobacco Use   Smoking status: Every Day    Packs/day: 1.00    Years: 44.00    Pack years: 44.00    Types: Cigarettes   Smokeless tobacco: Never   Tobacco comments:    1 pk per day  Vaping Use   Vaping Use: Former  Substance and Sexual Activity   Alcohol use: Yes    Comment: 0-5 beers daily   Drug use: Yes    Types: Marijuana   Sexual activity: Not Currently    Partners: Male  Other Topics Concern   Not on file  Social History Narrative   Patient is single with 2 children.   Patient is right handed.   Current Social History 08/10/2018        Right handed       Patient lives with significant other [name]) in one level Townhome 08/10/2018    Transportation: Patient has own vehicle  08/10/2018   Important Relationships [name] 08/10/2018    Pets: one dog named Browser 08/10/2018   Education / Work:  12th grade/ Disabled 08/10/2018   Interests / Fun: Watching baseball and Nascar 08/10/2018   Current Stressors: Significant other is depressed over his  poor health 08/10/2018   Religious / Personal Beliefs: "I believe in God" 08/10/2018   Other: "I had a miscarriage before I had my 2 sons." 08/10/2018   L. Ducatte, RN, BSN                                                                                                 Social Determinants of Health   Financial Resource Strain: Not on file  Food Insecurity: Not on file  Transportation Needs: Not on file  Physical Activity: Not on file  Stress: Not on  file  Social Connections: Not on file  Intimate Partner Violence: Not on file     PHYSICAL EXAM  Vitals:   09/24/21 1257  BP: 132/84  Pulse: 75  Weight: 115 lb (52.2 kg)  Height: 5\' 2"  (1.575 m)   Body mass index is 21.03 kg/m.  Generalized: Well developed, in no acute distress  Cardiology: normal rate and rhythm, no murmur auscultated  Respiratory: clear to auscultation bilaterally    Neurological examination  Mentation: Alert oriented to time, place, history taking. Follows all commands speech and language fluent Cranial nerve II-XII: Pupils were equal round reactive to light. Extraocular movements were full, visual field were full on confrontational test. Facial sensation and strength were normal. Head turning and shoulder shrug  were normal and symmetric. Motor: The motor testing reveals 5 over 5 strength of all 4 extremities with exception of 4-4+/5 right hip flexion. Good symmetric motor tone is noted throughout.  Sensory: Sensory testing is intact to soft touch on all 4 extremities. No evidence of extinction is noted.  Coordination: Cerebellar testing reveals good finger-nose-finger and reduced right heel-to-shin bilaterally.  Gait and station: Gait is mildly wide.  Reflexes: Deep tendon reflexes are brisk in lower but symmetric bilaterally.  MSK: no obvious edema or deformity noted of right knee. Tenderness with palpation of medial aspect noted. No obvious effusion. No signs of infection.    DIAGNOSTIC DATA (LABS, IMAGING, TESTING) - I reviewed patient records, labs, notes, testing and imaging myself where available.  Lab Results  Component Value Date   WBC 4.2 05/11/2021   HGB 13.3 05/11/2021   HCT 41.1 05/11/2021   MCV 97 05/11/2021   PLT 206 05/11/2021      Component Value Date/Time   NA 140 03/19/2021 1213   K 4.1 03/19/2021 1213   CL 101 03/19/2021 1213   CO2 26 03/19/2021 1213   GLUCOSE 67 03/19/2021 1213   GLUCOSE 93 12/12/2019 1330   BUN 12  03/19/2021 1213   CREATININE 0.75 03/19/2021 1213   CREATININE 0.68 12/12/2019 1330   CALCIUM 8.7 03/19/2021 1213   PROT 6.5 03/19/2021 1213   ALBUMIN 4.3 03/19/2021 1213   AST 25 03/19/2021 1213   AST 35 12/12/2019 1330   ALT 27 03/19/2021 1213   ALT 44 12/12/2019 1330   ALKPHOS 101 03/19/2021 1213   BILITOT 0.5 03/19/2021 1213   BILITOT 0.4 12/12/2019 1330   GFRNONAA 97 06/02/2020 1422   GFRNONAA >60 12/12/2019 1330   GFRAA 112 06/02/2020 1422   GFRAA >60 12/12/2019  1330   Lab Results  Component Value Date   CHOL 127 06/02/2020   HDL 55 06/02/2020   LDLCALC 56 06/02/2020   TRIG 82 06/02/2020   CHOLHDL 2.3 06/02/2020   Lab Results  Component Value Date   HGBA1C 5.3 12/07/2017   Lab Results  Component Value Date   FXOVANVB16 606 07/02/2015   No results found for: TSH  No flowsheet data found.   No flowsheet data found.   ASSESSMENT AND PLAN  60 y.o. year old female  has a past medical history of Arthritis, Asthma, Cancer ([place]), Depression, Deviated nasal septum, Hearing loss (06/11/2014), Herniated cervical disc, Hypertension, Leukocytosis (01/19/2019), Migraine, Miscarriage, MS (multiple sclerosis) ([place]), Shingles (12/24/2019), and Urinary tract infection without hematuria (08/10/2018). here with    Multiple sclerosis ([place]) - Plan: CBC with Differential/Platelets, CMP  High risk medication use  Gait disturbance  Paresthesias  RLS (restless legs syndrome)  Acute pain of right knee  Debbie Watts is doing fairly well. We will continue dimethyl fumerate, alternating daily and twice daily dosing. I will update labs, today. She will continue Tramadol as needed for generalized pain. She declines refill today stating she has about 20 tablets left. Last refill 08/21/2021. PDMP shows refills approx every 6 weeks. She will continue gabapentin and carb/levo for RLS. I will give her meloxicam 7.5 and advised 1-2 tablets daily as needed. If knee pain persists she will call PCP for  formal eval. She will continue to monitor back [pain. If worsens we can do lumbar MRI per Dr Garth Bigness recommendation. Healthy lifestyle habits encouraged. Fall precautions advised. She will follow up with Dr Felecia Shelling in 6 months.    Orders Placed This Encounter  Procedures   CBC with Differential/Platelets   CMP      Meds ordered this encounter  Medications   meloxicam (MOBIC) 7.5 MG tablet    Sig: Take 1 tablet (7.5 mg total) by mouth daily.    Dispense:  30 tablet    Refill:  0    Order Specific Question:   Supervising Provider    Answer:   Melvenia Beam [[id number]]       Debbora Presto, MSN, FNP-C 09/24/2021, 1:26 PM  [facility] [address] [place], [place] [address] [phone]

## 2021-09-24 ENCOUNTER — Encounter: Payer: Self-pay | Admitting: Family Medicine

## 2021-09-24 ENCOUNTER — Encounter: Payer: Self-pay | Admitting: Hematology and Oncology

## 2021-09-24 ENCOUNTER — Ambulatory Visit: Payer: Medicare Other | Admitting: Family Medicine

## 2021-09-24 VITALS — BP 132/84 | HR 75 | Ht 62.0 in | Wt 115.0 lb

## 2021-09-24 DIAGNOSIS — R269 Unspecified abnormalities of gait and mobility: Secondary | ICD-10-CM | POA: Diagnosis not present

## 2021-09-24 DIAGNOSIS — M25561 Pain in right knee: Secondary | ICD-10-CM

## 2021-09-24 DIAGNOSIS — Z79899 Other long term (current) drug therapy: Secondary | ICD-10-CM | POA: Diagnosis not present

## 2021-09-24 DIAGNOSIS — G43709 Chronic migraine without aura, not intractable, without status migrainosus: Secondary | ICD-10-CM

## 2021-09-24 DIAGNOSIS — G35 Multiple sclerosis: Secondary | ICD-10-CM | POA: Diagnosis not present

## 2021-09-24 DIAGNOSIS — G2581 Restless legs syndrome: Secondary | ICD-10-CM

## 2021-09-24 DIAGNOSIS — R202 Paresthesia of skin: Secondary | ICD-10-CM

## 2021-09-24 MED ORDER — MELOXICAM 7.5 MG PO TABS: 7.5000 mg | ORAL_TABLET | Freq: Every day | ORAL | 0 refills | Status: DC

## 2021-09-25 LAB — COMPREHENSIVE METABOLIC PANEL
ALT: 20 IU/L (ref 0–32)
AST: 22 IU/L (ref 0–40)
Albumin/Globulin Ratio: 2.3 — ABNORMAL HIGH (ref 1.2–2.2)
Albumin: 4.6 g/dL (ref 3.8–4.9)
Alkaline Phosphatase: 90 IU/L (ref 44–121)
BUN/Creatinine Ratio: 12 (ref 9–23)
BUN: 10 mg/dL (ref 6–24)
Bilirubin Total: 0.5 mg/dL (ref 0.0–1.2)
CO2: 29 mmol/L (ref 20–29)
Calcium: 8.8 mg/dL (ref 8.7–10.2)
Chloride: 100 mmol/L (ref 96–106)
Creatinine, Ser: 0.82 mg/dL (ref 0.57–1.00)
Globulin, Total: 2 g/dL (ref 1.5–4.5)
Glucose: 101 mg/dL — ABNORMAL HIGH (ref 70–99)
Potassium: 4.1 mmol/L (ref 3.5–5.2)
Sodium: 138 mmol/L (ref 134–144)
Total Protein: 6.6 g/dL (ref 6.0–8.5)
eGFR: 82 mL/min/{1.73_m2} (ref >59)

## 2021-09-25 LAB — CBC WITH DIFFERENTIAL/PLATELET
Basophils Absolute: 0.1 10*3/uL (ref 0.0–0.2)
Basos: 1 %
EOS (ABSOLUTE): 0.1 10*3/uL (ref 0.0–0.4)
Eos: 1 %
Hematocrit: 42.1 % (ref 34.0–46.6)
Hemoglobin: 14.1 g/dL (ref 11.1–15.9)
Immature Grans (Abs): 0 10*3/uL (ref 0.0–0.1)
Immature Granulocytes: 0 %
Lymphocytes Absolute: 0.8 10*3/uL (ref 0.7–3.1)
Lymphs: 11 %
MCH: 31.5 pg (ref 26.6–33.0)
MCHC: 33.5 g/dL (ref 31.5–35.7)
MCV: 94 fL (ref 79–97)
Monocytes Absolute: 0.6 10*3/uL (ref 0.1–0.9)
Monocytes: 9 %
Neutrophils Absolute: 5.4 10*3/uL (ref 1.4–7.0)
Neutrophils: 78 %
Platelets: 255 10*3/uL (ref 150–450)
RBC: 4.48 x10E6/uL (ref 3.77–5.28)
RDW: 12.5 % (ref 11.7–15.4)
WBC: 6.9 10*3/uL (ref 3.4–10.8)

## 2021-10-06 ENCOUNTER — Other Ambulatory Visit: Payer: Self-pay | Admitting: *Deleted

## 2021-10-06 DIAGNOSIS — G35 Multiple sclerosis: Secondary | ICD-10-CM

## 2021-10-06 MED ORDER — DIMETHYL FUMARATE 240 MG PO CPDR: DELAYED_RELEASE_CAPSULE | ORAL | 11 refills | Status: DC

## 2021-10-08 ENCOUNTER — Ambulatory Visit: Payer: Medicare Other | Admitting: Behavioral Health

## 2021-10-08 ENCOUNTER — Other Ambulatory Visit: Payer: Self-pay | Admitting: Student

## 2021-10-08 ENCOUNTER — Other Ambulatory Visit: Payer: Self-pay | Admitting: Neurology

## 2021-10-08 DIAGNOSIS — I10 Essential (primary) hypertension: Secondary | ICD-10-CM

## 2021-10-29 ENCOUNTER — Encounter: Payer: Self-pay | Admitting: Internal Medicine

## 2021-10-29 ENCOUNTER — Encounter: Payer: Self-pay | Admitting: Hematology and Oncology

## 2021-10-29 ENCOUNTER — Ambulatory Visit (INDEPENDENT_AMBULATORY_CARE_PROVIDER_SITE_OTHER): Payer: Medicare PPO | Admitting: Internal Medicine

## 2021-10-29 VITALS — BP 132/90 | HR 100 | Temp 98.2°F | Ht 62.0 in | Wt 115.7 lb

## 2021-10-29 DIAGNOSIS — J029 Acute pharyngitis, unspecified: Secondary | ICD-10-CM

## 2021-10-29 DIAGNOSIS — J449 Chronic obstructive pulmonary disease, unspecified: Secondary | ICD-10-CM | POA: Diagnosis not present

## 2021-10-29 HISTORY — DX: Acute pharyngitis, unspecified: J02.9

## 2021-10-29 MED ORDER — ALBUTEROL SULFATE HFA 108 (90 BASE) MCG/ACT IN AERS: 2.0000 | INHALATION_SPRAY | Freq: Four times a day (QID) | RESPIRATORY_TRACT | 2 refills | Status: DC | PRN

## 2021-10-29 MED ORDER — BENZONATATE 100 MG PO CAPS: 100.0000 mg | ORAL_CAPSULE | Freq: Three times a day (TID) | ORAL | 1 refills | Status: DC | PRN

## 2021-10-29 NOTE — Patient Instructions (Signed)
I would recommend OTC cough syrups, you can continue dayquil every 4-6 hours. Also lozenges and chloraseptic spray. I have sent in [place] as well which may help your cough.

## 2021-10-29 NOTE — Assessment & Plan Note (Addendum)
Patient reports a two day history of sore throat, nasal congestion, productive cough, shortness of breath, subjective fevers, and headaches. Denies chest pain, myalgias, recent sick contacts. States she is vaccinated against the flu and covid x3. She states her throat pain is very sharp in nature. She has also had a decreased appetite and nausea. Her phlegm has been clear in color. She has been taking OTC supplements, cough drops and dayquil cough syrup with some improvement.  Assessment/plan: Suspect viral etiology. Exam was unremarkable with no tonsillar exudate, cervical lymphadenopathy. Vitals stable with no fever noted. Recommended continuing supportive care and given return precautions.  - Follow up covid/flu swab - Continue supportive care with dayquil, lozenges, chloraseptic spray  - Tessalon perles prn - Refilled generic albuterol inhaler  - Return precautions given

## 2021-10-29 NOTE — Progress Notes (Signed)
° °  CC: sore throat  HPI:  Debbie Watts is a 61 y.o. with a PMHx listed below presenting for evaluation of a sore throat for the past two days. For details of today's visit and the status of his chronic medical issues please refer to the assessment and plan.   Past Medical History:  Diagnosis Date   Arthritis    Asthma    Cancer ([place])    chemo for breast ca  x58mos   Depression    Deviated nasal septum    Hearing loss 06/11/2014   Bilateral   Herniated cervical disc    Hypertension    Leukocytosis 01/19/2019   Migraine    Miscarriage    2   MS (multiple sclerosis) ([place])    Shingles 12/24/2019   Urinary tract infection without hematuria 08/10/2018   Review of Systems:   Review of Systems  Constitutional:  Positive for chills and fever. Negative for diaphoresis and malaise/fatigue.  HENT:  Positive for congestion and sore throat. Negative for ear pain and sinus pain.   Eyes:  Negative for pain, discharge and redness.  Respiratory:  Positive for cough, sputum production and shortness of breath.   Cardiovascular:  Negative for chest pain and leg swelling.  Gastrointestinal:  Positive for nausea. Negative for abdominal pain, diarrhea and vomiting.  Musculoskeletal:  Negative for myalgias.  Neurological:  Positive for headaches.    Physical Exam:  Vitals:   10/29/21 1330 10/29/21 1336  BP:  132/90  Pulse:  100  Temp:  98.2 F (36.8 C)  TempSrc:  Oral  SpO2:  100%  Weight: 115 lb 11.2 oz (52.5 kg) 115 lb 11.2 oz (52.5 kg)  Height:  5\' 2"  (1.575 m)   Physical Exam General: alert, appears stated age, in no acute distress HEENT: Normocephalic, atraumatic, EOM intact, conjunctiva normal, no pharyngeal erythema or exudate. Normal nasal turbinates CV: Regular rate and rhythm, no murmurs rubs or gallops Pulm: Clear to auscultation bilaterally, normal work of breathing Abdomen: Soft, nondistended, bowel sounds present, no tenderness to palpation MSK: No lower extremity  edema Skin: Warm and dry Neuro: Alert and oriented x3   Assessment & Plan:   See Encounters Tab for problem based charting.  Patient discussed with Dr.  Saverio Danker

## 2021-10-30 ENCOUNTER — Telehealth: Payer: Self-pay

## 2021-10-30 LAB — COVID-19, FLU A+B NAA
Influenza A, NAA: NOT DETECTED
Influenza B, NAA: NOT DETECTED
SARS-CoV-2, NAA: NOT DETECTED

## 2021-10-30 NOTE — Progress Notes (Signed)
Internal Medicine Clinic Attending ° °Case discussed with Dr. Rehman  At the time of the visit.  We reviewed the resident’s history and exam and pertinent patient test results.  I agree with the assessment, diagnosis, and plan of care documented in the resident’s note.  ° °

## 2021-10-30 NOTE — Telephone Encounter (Signed)
Requesting test result, please call pt back.  

## 2021-10-30 NOTE — Telephone Encounter (Signed)
Covid test has not yet resulted. Patient notified. She is advised to check her MyChart later today or tomorrow for results. She is appreciative.

## 2021-10-31 ENCOUNTER — Telehealth: Payer: Self-pay | Admitting: Student

## 2021-10-31 DIAGNOSIS — J029 Acute pharyngitis, unspecified: Secondary | ICD-10-CM

## 2021-10-31 MED ORDER — GUAIFENESIN-CODEINE 100-10 MG/5ML PO SOLN: 5.0000 mL | Freq: Two times a day (BID) | ORAL | 0 refills | Status: DC | PRN

## 2021-10-31 NOTE — Telephone Encounter (Signed)
Returned page from patient.  I talked to patient's significant other [name].  His contact information a listed in her chart.  He reports the patient was having symptoms of coughing, sore throat, sneezing and low-grade fever of 99 degree.  States that the coughing is getting worse that prevent her from sleeping.  There was no shortness of breath or increased sputum production.  She has been using DayQuil and NyQuil with minimal relief.  Patient was seen in the clinic on 1/5 and tested negative for COVID and flu.  This is likely a viral upper respiratory infection.  Merry Proud also has similar symptoms.  He asked if we can prescribe a codeine cough syrup to help with coughing.  Patient is taking tramadol chronically, last fill 10/13/2021.  I discussed with Merry Proud the risk of using tramadol and a coating product at the same time.  He understands the risk and states that she has not taking her tramadol today.  Ideally, I advised him to not take codeine with tramadol at the same time.  He verbalizes understanding.  If her symptoms worsen, I advised him to call us back or follow-up with the clinic next week to rule out pneumonia.

## 2021-11-02 ENCOUNTER — Other Ambulatory Visit: Payer: Self-pay | Admitting: Internal Medicine

## 2021-11-02 ENCOUNTER — Telehealth: Payer: Self-pay

## 2021-11-02 DIAGNOSIS — J029 Acute pharyngitis, unspecified: Secondary | ICD-10-CM

## 2021-11-02 MED ORDER — AMOXICILLIN-POT CLAVULANATE 875-125 MG PO TABS: 1.0000 | ORAL_TABLET | Freq: Two times a day (BID) | ORAL | 0 refills | Status: AC

## 2021-11-02 NOTE — Progress Notes (Signed)
Patient reports continued low grade fevers, worsening productive cough with green phlegm. No dyspnea. Continues to have migraines and some sinus pain. Will treat with short course of abx.  -Augmentin 875 BID for 5 days

## 2021-11-02 NOTE — Telephone Encounter (Signed)
Tessalon Perles and albuterol MDI sent 1/5 to [facility] on [address]  guaiFENesin-codeine 100-10 MG/5ML syrup sent 1/7 to same pharmacy.   Call placed to patient. States she did not know about tessalon and cannot afford albuterol. She has been taking guaiFENesin-codeine 100-10 MG/5ML syrup without relief. States she has a temp of 100. Having green nasal drainage and coughing up green sputum. Has developed body aches 2/2 cough. Has not taken tramadol since she started guaiFENesin-codeine 100-10 MG/5ML syrup per instructions from on call Provider. Please advise.

## 2021-11-02 NOTE — Telephone Encounter (Signed)
Requesting to speak with a nurse about getting medication for cough. Please call pt back.

## 2021-11-10 ENCOUNTER — Encounter: Payer: Self-pay | Admitting: Student

## 2021-11-10 ENCOUNTER — Ambulatory Visit (INDEPENDENT_AMBULATORY_CARE_PROVIDER_SITE_OTHER): Payer: Medicare PPO | Admitting: Student

## 2021-11-10 VITALS — BP 114/72 | HR 92 | Temp 97.9°F | Ht 62.0 in | Wt 117.4 lb

## 2021-11-10 DIAGNOSIS — M546 Pain in thoracic spine: Secondary | ICD-10-CM

## 2021-11-10 DIAGNOSIS — G8929 Other chronic pain: Secondary | ICD-10-CM | POA: Diagnosis not present

## 2021-11-10 DIAGNOSIS — Z23 Encounter for immunization: Secondary | ICD-10-CM | POA: Diagnosis not present

## 2021-11-10 DIAGNOSIS — N390 Urinary tract infection, site not specified: Secondary | ICD-10-CM

## 2021-11-10 DIAGNOSIS — Z0000 Encounter for general adult medical examination without abnormal findings: Secondary | ICD-10-CM

## 2021-11-10 LAB — POCT URINALYSIS DIPSTICK
Bilirubin, UA: NEGATIVE
Blood, UA: NEGATIVE
Glucose, UA: POSITIVE — AB
Nitrite, UA: POSITIVE
Protein, UA: POSITIVE — AB
Spec Grav, UA: <=1.005 — AB (ref 1.010–1.025)
Urobilinogen, UA: 1 E.U./dL
pH, UA: 6 (ref 5.0–8.0)

## 2021-11-10 MED ORDER — CYCLOBENZAPRINE HCL 5 MG PO TABS: 5.0000 mg | ORAL_TABLET | Freq: Three times a day (TID) | ORAL | 1 refills | Status: DC | PRN

## 2021-11-10 MED ORDER — NITROFURANTOIN MONOHYD MACRO 100 MG PO CAPS: 100.0000 mg | ORAL_CAPSULE | Freq: Two times a day (BID) | ORAL | 0 refills | Status: AC

## 2021-11-10 NOTE — Patient Instructions (Addendum)
Please look into   [email or web address] you can get a membership through your insurance.   Please take macrobid 5 days, and let us know if your symptoms do not resolve.    Get help right away if: You ever feel like someone may hurt himself or herself or others, or if he or she shares thoughts about taking his or her own life. You can go to your nearest emergency department or: Call a crisis center or a local suicide prevention center. These are often located at hospitals, clinics, [facility], social service providers, or health departments. Call your local emergency services (911 in the [place] Call a suicide crisis helpline, such as the [facility] at 818-605-2372 or 988 in the [place]. This is open 24 hours a day in the [place] Text HOME to the Crisis Text Line at 402-148-5169 (in the [place].). Call the [employer] health and human services helpline (211 in the [place].). (743)177-0544

## 2021-11-11 NOTE — Progress Notes (Signed)
Internal Medicine Clinic Attending  Case discussed with Dr. Carter  At the time of the visit.  We reviewed the resident's history and exam and pertinent patient test results.  I agree with the assessment, diagnosis, and plan of care documented in the resident's note.  

## 2021-11-11 NOTE — Assessment & Plan Note (Signed)
Patient presents with 2 days of dysuria without fevers or chills. She has taken OTC azo which helped with her urinary discomfort. She has had UTI's in the past, most recently 06/09/2020. UCx grew E. Coli that was sensitive to macrobid.   A/P: Patient with uncomplicated UTI. This is after completing a 5d course of augmentin for sinus infection 1/9-1/14.  -Will prescribe macrobid 100mg  BID for 5 d

## 2021-11-11 NOTE — Assessment & Plan Note (Signed)
Patient received PCV 20 this clinic visit.

## 2021-11-11 NOTE — Progress Notes (Signed)
° °  CC: Dysuria   HPI:  [name] is a 61 y.o. F with PMH per below who presents with dysuria. Please see problem based charting under encounters tab for further details.    Past Medical History:  Diagnosis Date   Arthritis    Asthma    Cancer ([place])    chemo for breast ca  x60mos   Depression    Deviated nasal septum    Hearing loss 06/11/2014   Bilateral   Herniated cervical disc    Hypertension    Leukocytosis 01/19/2019   Migraine    Miscarriage    2   MS (multiple sclerosis) ([place])    Shingles 12/24/2019   Urinary tract infection without hematuria 08/10/2018   Review of Systems:  Please see problem based charting under encounters tab for further details.    Physical Exam:  Vitals:   11/10/21 1501  BP: 114/72  Pulse: 92  Temp: 97.9 F (36.6 C)  TempSrc: Oral  SpO2: 100%  Weight: 117 lb 6.4 oz (53.3 kg)  Height: 5\' 2"  (1.575 m)   Constitutional: well-developed, well-nourished, and in no distress.  HENT:  Head: Normocephalic and atraumatic.  Neck: Normal range of motion.  Cardiovascular: Normal rate, regular rhythm, normal heart sounds and intact distal pulses. Exam reveals no gallop and no friction rub.  No murmur heard. Pulmonary: Effort normal and breath sounds normal. No respiratory distress.  Abdominal: Soft. Bowel sounds are normal. Non tender, non distended, No CVA tenderness  Musculoskeletal: Normal range of motion.        General: No tenderness or edema.  Neurological: alert and oriented to person, place, and time.  Skin: Skin is warm and dry.    Assessment & Plan:   See Encounters Tab for problem based charting.  Patient discussed with Dr.  Cain Sieve

## 2021-11-13 LAB — URINE CULTURE

## 2021-11-16 ENCOUNTER — Telehealth: Payer: Self-pay | Admitting: Family Medicine

## 2021-11-16 NOTE — Telephone Encounter (Signed)
At 10:53 pt left vm asking for another way to go about getting her Dimethyl Fumarate 240 MG CPDR.  Pt states she is down to about 5 pills and is only taking about 1 a day to stretch medication.  Pt is asking for a call to know if there are any samples of this medication.  Please call.

## 2021-11-16 NOTE — Telephone Encounter (Signed)
Called the patient and she states that her medication doesn't appear to be covered and she cant afford. I advised that I dont see a new PA on file. Advised that we will complete a new prior auth and see if we can get it covered. Advised I would let her know when I find out  PA is approved for the patient and a Josem Kaufmann is already on file.   Authorization starting on 10/25/2021 and ending on 10/24/2022.  Called the pt back and her copay is around 200 a mth for the medication and she is unable to afford this. Advised that we have had similar situations with patients and have had to switch to alternative medication. I advised I would send this information to Dr Felecia Shelling and see what he recommends.

## 2021-11-17 ENCOUNTER — Telehealth: Payer: Self-pay | Admitting: Neurology

## 2021-11-17 NOTE — Telephone Encounter (Signed)
Patient wants a letter from Dr. Felecia Shelling stating she can take dogs to hotels anywhere she goes as emotional support. She stated he wrote her a letter years ago and she needed an updated one. Pt does have access to my chart. Thank you

## 2021-11-18 ENCOUNTER — Encounter: Payer: Self-pay | Admitting: *Deleted

## 2021-11-18 ENCOUNTER — Encounter: Payer: Self-pay | Admitting: Neurology

## 2021-11-18 NOTE — Telephone Encounter (Signed)
Faxed completed/signed Vumerity start form to [place] at [phone]. Received fax confirmation. Per Dr. Felecia Shelling, ok for pt to take 2 caps po twice daily and will recheck labs at next appt.

## 2021-11-18 NOTE — Telephone Encounter (Signed)
Released letter to pt mychart.

## 2021-11-24 NOTE — Telephone Encounter (Signed)
Vumerity approved through insurance. Approved until 10/24/22 for up to a 30 days supply. They denied coverage for 90 days d/t being specialty drug. I emailed update to [facility].

## 2021-11-24 NOTE — Telephone Encounter (Signed)
Crystal states PA needed for Vumerity. Submitted urgent PA on CMM. Key: [id number]. Waiting on determination from [facility].

## 2021-11-24 NOTE — Telephone Encounter (Signed)
Received this update from Crystal[facility]: "Yes, we sent the prescription. I called the pharmacy as well who advised they have not received the prescription. I also reached out to our pharmacy team to have the prescription resent.  It was resent yesterday around 3:00 pm. Thank you! Crystal"

## 2021-11-24 NOTE — Telephone Encounter (Signed)
Received email from [facility] 11/23/21: "Good morning, I just spoke to Dr. Lubertha South (diroximel fumarate) patient Debbie Watts, [birth year].  She took her last dose of Tecfidera (dimethyl fumarate) today. We called [facility] & they are not ready to ship Vumerity yet. She asked that I notify your office - she isn't sure what to do next. Thank you! Crystal"  I called [facility] this morning and spoke w/ Latoria. They do not have active Vumerity rx on file. They have not filled anything since 2018. I emailed Crystal back letting her know this update. Sounds like [employer] needs to send start form/prescription to [place] still. Waiting on update from [place].

## 2021-11-27 ENCOUNTER — Telehealth: Payer: Self-pay | Admitting: Family Medicine

## 2021-11-27 NOTE — Telephone Encounter (Signed)
Received he following message back from [place]: "I have left messages for her on [phone] Tuesday, Wed & Thursday.  I will call again in just a few minutes."

## 2021-11-27 NOTE — Telephone Encounter (Signed)
Received another update from [employer] via email: "She answered. Her copay was unaffordable, so she was transferred to [facility] for screening.  I will keep you updated!"

## 2021-11-27 NOTE — Telephone Encounter (Signed)
Pt has called to report that she has been out of her Dimethyl Fumarate 240 MG CPDR for 3 days, pt uses [facility]

## 2021-11-27 NOTE — Telephone Encounter (Signed)
Sent urgent email to [place]. Pt should be transitioning to Vumerity. Had informed Crystal this past week that PA approved. Asked she call pt to f/u about getting her on Vumerity therapy.

## 2021-11-30 NOTE — Telephone Encounter (Signed)
Received update from [facility] 11/27/21 at 2:01p: "ST was enrolled in [facility] today! We should be able to get her shipment scheduled Monday."

## 2021-12-01 NOTE — Telephone Encounter (Signed)
Update from [place]: "Debbie Watts will receive her shipment Thursday, 2/9."

## 2021-12-01 NOTE — Telephone Encounter (Signed)
I received a call from the pharmacist confirming the date on the script was 11/16/2021. She accepted the verbal confirmation.

## 2021-12-07 ENCOUNTER — Other Ambulatory Visit: Payer: Self-pay | Admitting: Hematology and Oncology

## 2021-12-07 ENCOUNTER — Other Ambulatory Visit: Payer: Self-pay | Admitting: Student

## 2021-12-07 DIAGNOSIS — F339 Major depressive disorder, recurrent, unspecified: Secondary | ICD-10-CM

## 2021-12-14 NOTE — Telephone Encounter (Signed)
I called patient. She reports that she received her Vumerity shipment and is tolerating it well. I reminded her of her appointment in June and she will call us with interim questions or concerns.

## 2021-12-14 NOTE — Addendum Note (Signed)
Addended by: Lester Wildwood A on: 12/14/2021 01:23 PM   Modules accepted: Orders

## 2021-12-17 ENCOUNTER — Other Ambulatory Visit: Payer: Self-pay | Admitting: Neurology

## 2021-12-20 DIAGNOSIS — M25461 Effusion, right knee: Secondary | ICD-10-CM | POA: Diagnosis not present

## 2021-12-20 DIAGNOSIS — M25561 Pain in right knee: Secondary | ICD-10-CM | POA: Diagnosis not present

## 2021-12-20 DIAGNOSIS — S8001XA Contusion of right knee, initial encounter: Secondary | ICD-10-CM | POA: Diagnosis not present

## 2021-12-20 DIAGNOSIS — W230XXA Caught, crushed, jammed, or pinched between moving objects, initial encounter: Secondary | ICD-10-CM | POA: Diagnosis not present

## 2021-12-23 ENCOUNTER — Ambulatory Visit (HOSPITAL_COMMUNITY)
Admission: RE | Admit: 2021-12-23 | Discharge: 2021-12-23 | Disposition: A | Discharge status: Home / Self Care | Payer: Medicare HMO | Source: Ambulatory Visit | Attending: Internal Medicine | Admitting: Internal Medicine

## 2021-12-23 ENCOUNTER — Ambulatory Visit (INDEPENDENT_AMBULATORY_CARE_PROVIDER_SITE_OTHER): Payer: Medicare HMO | Admitting: Internal Medicine

## 2021-12-23 ENCOUNTER — Encounter: Payer: Self-pay | Admitting: Hematology and Oncology

## 2021-12-23 ENCOUNTER — Encounter: Payer: Self-pay | Admitting: Internal Medicine

## 2021-12-23 ENCOUNTER — Other Ambulatory Visit: Payer: Self-pay

## 2021-12-23 ENCOUNTER — Other Ambulatory Visit: Payer: Self-pay | Admitting: Student

## 2021-12-23 VITALS — BP 120/73 | HR 95 | Temp 98.2°F | Ht 62.0 in | Wt 118.9 lb

## 2021-12-23 DIAGNOSIS — M25532 Pain in left wrist: Secondary | ICD-10-CM | POA: Diagnosis not present

## 2021-12-23 DIAGNOSIS — M7989 Other specified soft tissue disorders: Secondary | ICD-10-CM | POA: Diagnosis not present

## 2021-12-23 HISTORY — DX: Pain in left wrist: M25.532

## 2021-12-23 IMAGING — CR DG WRIST COMPLETE 3+V*L*
4 series · 4 of 4 positions shown · non-contrast
Comparison: None.

CLINICAL DATA: Acute wrist pain. Fell yesterday catching self with
left arm.

EXAM:
LEFT WRIST - COMPLETE 3+ VIEW

[wrist pa]
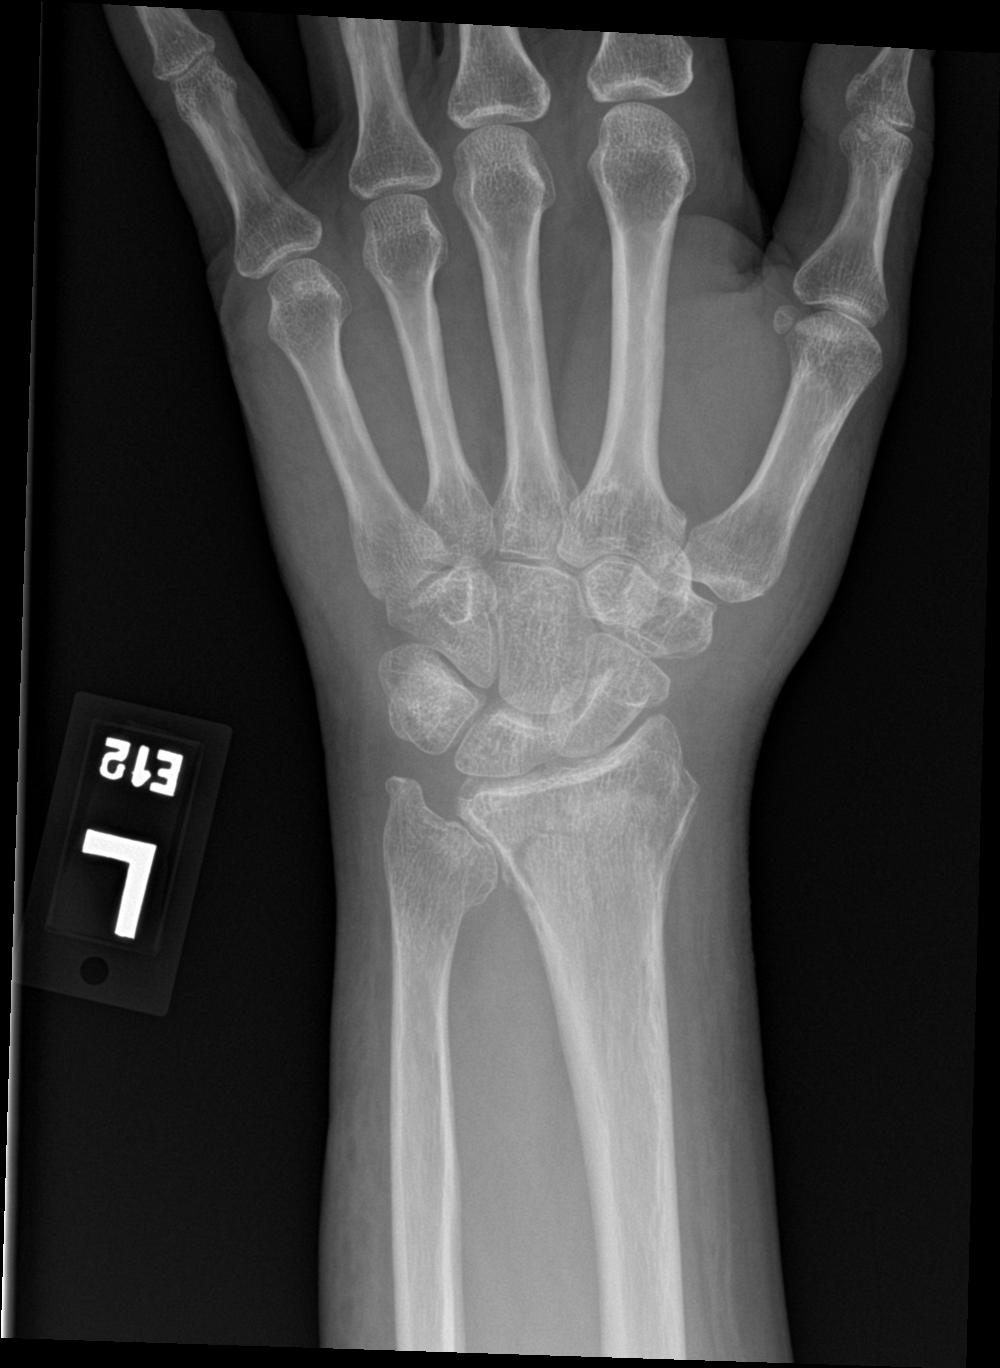

[wrist obl]
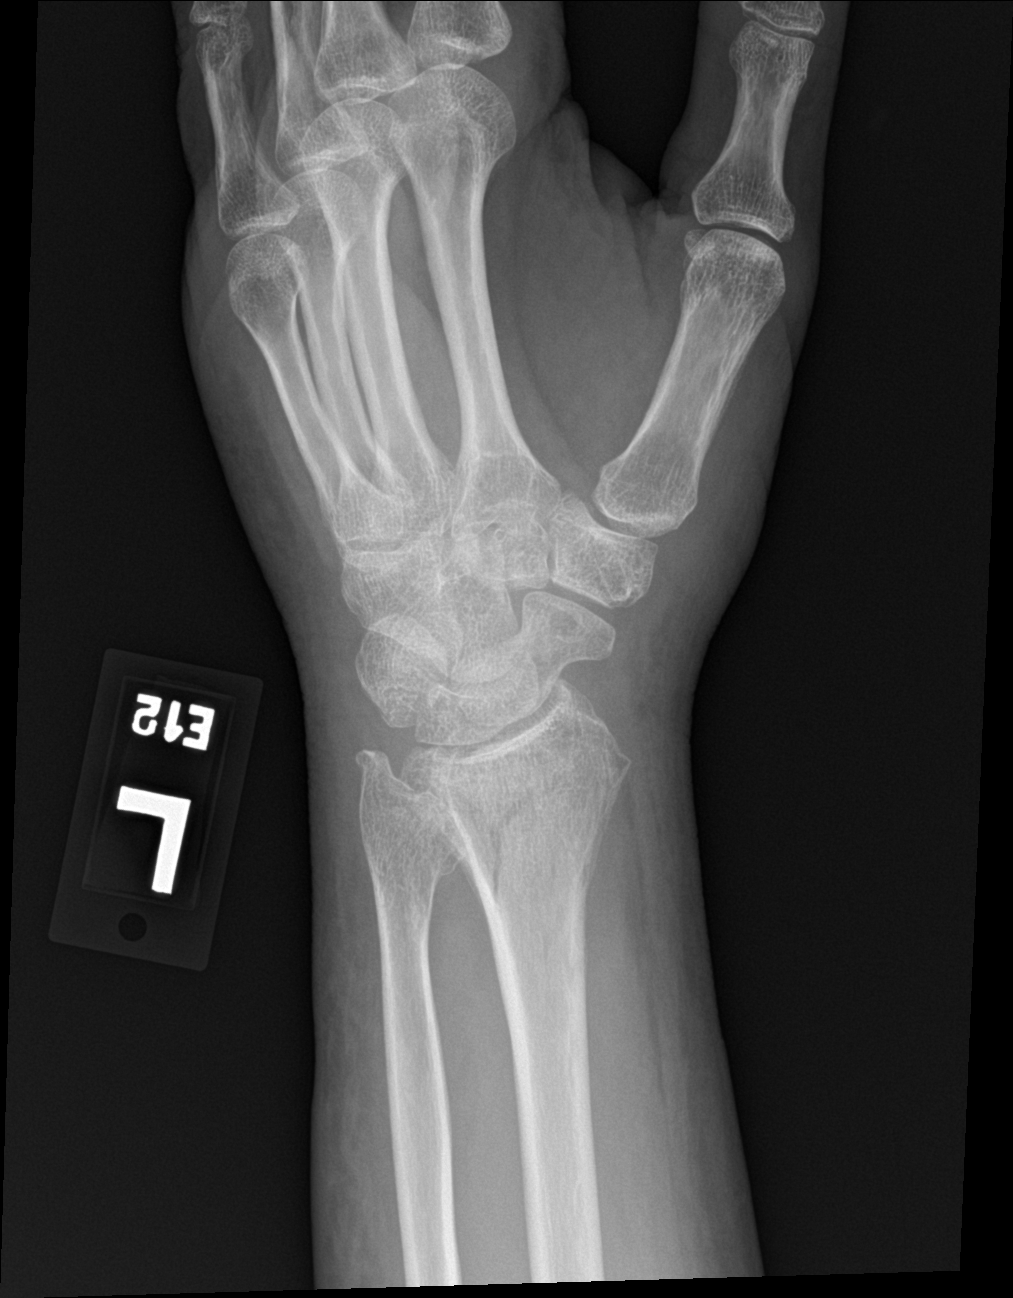

[wrist lat]
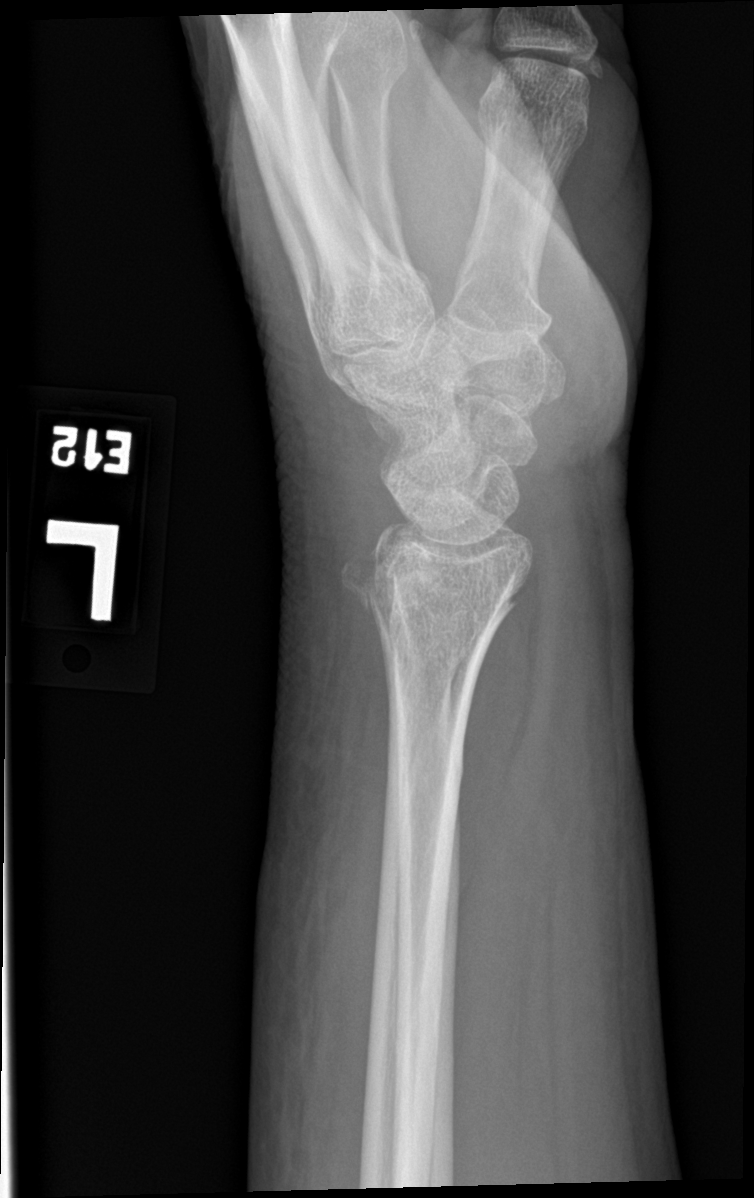

[wrist navicular]
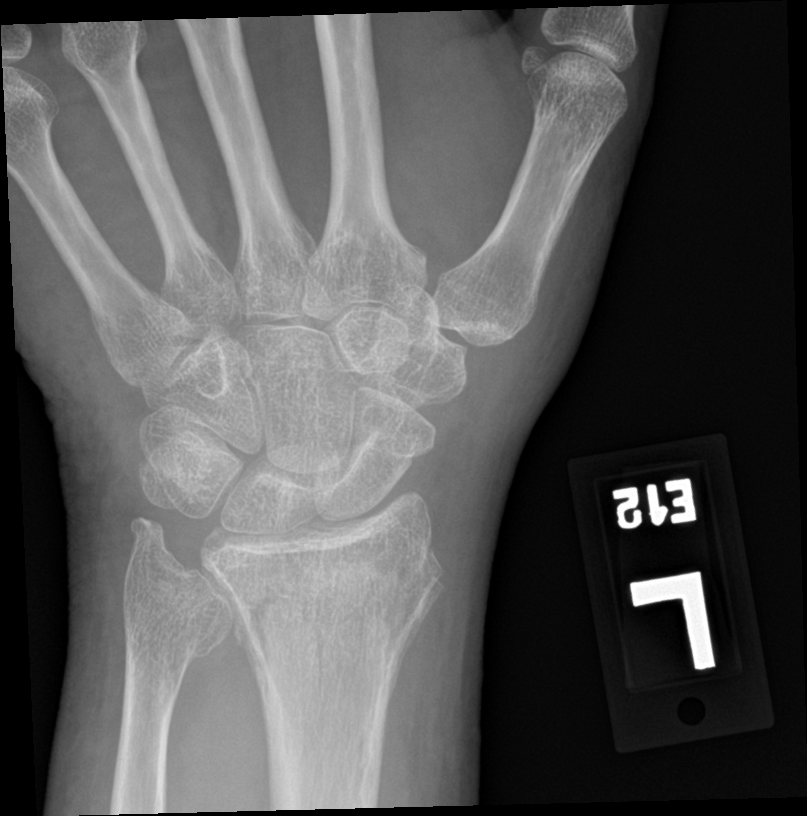

[4 of 4 positions shown; findings below may reference images not displayed]

FINDINGS: There is diffuse decreased bone mineralization. There is curvilinear
lucency within the distal radial metaphysis on frontal and oblique
view and minimal 2 mm cortical step-off of the volar aspect of the
distal radial metaphysis on lateral view. Additional 1 mm cortical
step-off at the dorsal aspect of the distal radial metaphysis on
lateral view. Moderate regional soft tissue swelling. Moderate
distal radioulnar joint space narrowing and peripheral osteophytosis
degenerative changes.

Mild-to-moderate triscaphe joint and thumb carpometacarpal
osteoarthritis.
IMPRESSION: Acute nondisplaced fracture of the distal radial metaphysis.

These results will be called to the ordering clinician or
representative by the Radiologist Assistant, and communication
documented in the PACS or [REDACTED].

## 2021-12-23 MED ORDER — HYDROCODONE-ACETAMINOPHEN 5-325 MG PO TABS: 1.0000 | ORAL_TABLET | Freq: Four times a day (QID) | ORAL | 0 refills | Status: DC | PRN

## 2021-12-23 MED ORDER — HYDROCODONE-ACETAMINOPHEN 7.5-325 MG PO TABS: 1.0000 | ORAL_TABLET | Freq: Four times a day (QID) | ORAL | 0 refills | Status: AC | PRN

## 2021-12-23 NOTE — Assessment & Plan Note (Signed)
Yesterday, patient states she was walking with her cane to go into the store, and lost her balance.  She landed on her left wrist which broke her fall.  She did not note any obvious deformity of the left wrist.  Since then, her left wrist 10 out of 10 pain.  Swelling noted of the wrist and forearm.  Range of motion limited to the swelling and pain.  No numbness or tingling of the left phalanges.  Radial pulse intact on exam.  Patient has tried over-the-counter Tylenol and ibuprofen with minimal relief.  She also tried tramadol (home medication for chronic back pain) with minimal relief.  She states icing the left wrist has improved the swelling.  Patient will need x-ray imaging of the left wrist to rule out fractures.  Also mindful of fracture of the scaphoid as that can become necrosis.  ? ?PLAN: ?Norco 5-325 mg 1 every 6 hours as needed for 5 days for pain relief ?Patient was advised not to take tramadol or Tylenol in combination with Norco ?Follow-up x-ray of the left wrist; call patients with results of imaging ?Patient advised to use left wrist brace for support and continue icing ?

## 2021-12-23 NOTE — Progress Notes (Signed)
? ?CC: Left wrist pain ? ?HPI: ? ?[name] is a 61 y.o. female with a past medical history stated below and presents today for cc listed above. Please see problem based assessment and plan for additional details. ? ?Past Medical History:  ?Diagnosis Date  ? Arthritis   ? Asthma   ? Cancer [facility])   ? chemo for breast ca  x9mos  ? Depression   ? Deviated nasal septum   ? Hearing loss 06/11/2014  ? Bilateral  ? Herniated cervical disc   ? Hypertension   ? Leukocytosis 01/19/2019  ? Migraine   ? Miscarriage   ? 2  ? MS (multiple sclerosis) ([place])   ? Shingles 12/24/2019  ? Urinary tract infection without hematuria 08/10/2018  ? ? ?Current Outpatient Medications on File Prior to Visit  ?Medication Sig Dispense Refill  ? acetaminophen-codeine (TYLENOL #3) 300-30 MG tablet Take 1 tablet by mouth 3 (three) times daily as needed for moderate pain. 30 tablet 0  ? albuterol (VENTOLIN HFA) 108 (90 Base) MCG/ACT inhaler Inhale 2 puffs into the lungs every 6 (six) hours as needed for wheezing or shortness of breath. 8 g 2  ? anastrozole (ARIMIDEX) 1 MG tablet Take 1 tablet (1 mg total) by mouth daily. 90 tablet 3  ? aspirin-acetaminophen-caffeine (EXCEDRIN MIGRAINE) 250-250-65 MG tablet Take 2 tablets by mouth every 6 (six) hours as needed for migraine.    ? benzonatate (TESSALON PERLES) 100 MG capsule Take 1 capsule (100 mg total) by mouth 3 (three) times daily as needed for cough. 30 capsule 1  ? betamethasone valerate lotion (VALISONE) 0.1 % Apply 1 application topically daily as needed for irritation.     ? carbidopa-levodopa (SINEMET IR) 10-100 MG tablet TAKE 1 TABLET BY MOUTH EVERYDAY AT BEDTIME 90 tablet 3  ? cyclobenzaprine (FLEXERIL) 5 MG tablet Take 1 tablet (5 mg total) by mouth 3 (three) times daily as needed for muscle spasms. 30 tablet 1  ? diphenhydrAMINE (BENADRYL) 25 MG tablet Take 25 mg by mouth at bedtime as needed.     ? fluconazole (DIFLUCAN) 100 MG tablet TAKE 1 TABLET BY MOUTH EVERY DAY 30 tablet 2   ? fluticasone (FLONASE) 50 MCG/ACT nasal spray PLACE 2 SPRAYS INTO BOTH NOSTRILS AT BEDTIME. 48 mL 2  ? gabapentin (NEURONTIN) 300 MG capsule TAKE 2 CAPSULES (600 MG TOTAL) BY MOUTH 2 (TWO) TIMES DAILY. 360 capsule 3  ? guaiFENesin-codeine 100-10 MG/5ML syrup Take 5 mLs by mouth 2 (two) times daily as needed for cough. 118 mL 0  ? lisinopril (ZESTRIL) 5 MG tablet TAKE 1 TABLET BY MOUTH EVERY DAY 90 tablet 1  ? meloxicam (MOBIC) 7.5 MG tablet Take 1 tablet (7.5 mg total) by mouth daily. 30 tablet 0  ? Multiple Vitamin (MULTIVITAMIN WITH MINERALS) TABS tablet Take 2 tablets by mouth daily.    ? omeprazole (PRILOSEC OTC) 20 MG tablet Take 20 mg by mouth daily as needed (acid reflux).    ? PARoxetine (PAXIL) 20 MG tablet TAKE 1 TABLET BY MOUTH EVERY DAY 90 tablet 1  ? traMADol (ULTRAM) 50 MG tablet TAKE 1 TABLET BY MOUTH TWICE A DAY AS NEEDED 60 tablet 5  ? valACYclovir (VALTREX) 1000 MG tablet Take 1 tablet (1,000 mg total) by mouth 2 (two) times daily. 14 tablet 0  ? VUMERITY 231 MG CPDR TAKE 1 CAPSULE (231MG ) BY MOUTH TWICE DAILY FOR THE FIRST 7 DAYS, THEN TAKE 2 CAPSULES (462MG ) TWICE DAILY THEREAFTER 106 capsule 0  ? [  DISCONTINUED] prochlorperazine (COMPAZINE) 10 MG tablet TAKE 1 TABLET (10 MG TOTAL) BY MOUTH EVERY 6 (SIX) HOURS AS NEEDED (NAUSEA OR VOMITING). 30 tablet 1  ? ?No current facility-administered medications on file prior to visit.  ? ? ?Family History  ?Adopted: Yes  ?Problem Relation Age of Onset  ? Cancer Maternal Uncle   ?     throat  ? ? ?Social History  ? ?Socioeconomic History  ? Marital status: Single  ?  Spouse name: Not on file  ? Number of children: 2  ? Years of education: 103  ? Highest education level: Not on file  ?Occupational History  ? Occupation: Disabled  ?Tobacco Use  ? Smoking status: Every Day  ?  Packs/day: 1.00  ?  Years: 44.00  ?  Pack years: 44.00  ?  Types: Cigarettes  ? Smokeless tobacco: Never  ? Tobacco comments:  ?  1 pk per day  ?Vaping Use  ? Vaping Use: Former   ?Substance and Sexual Activity  ? Alcohol use: Yes  ?  Comment: 0-5 beers daily  ? Drug use: Yes  ?  Types: Marijuana  ? Sexual activity: Not Currently  ?  Partners: Male  ?Other Topics Concern  ? Not on file  ?Social History Narrative  ? Patient is single with 2 children.  ? Patient is right handed.  ? Current Social History 08/10/2018    ?   ? Right handed   ?   ? Patient lives with significant other [name]) in one level Townhome 08/10/2018   ? Transportation: Patient has own vehicle  08/10/2018  ? Important Relationships [name] 08/10/2018   ? Pets: one dog named Browser 08/10/2018  ? Education / Work:  12th grade/ Disabled 08/10/2018  ? Interests / Fun: Watching baseball and Nascar 08/10/2018  ? Current Stressors: Significant other is depressed over his poor health 08/10/2018  ? Religious / Personal Beliefs: "I believe in God" 08/10/2018  ? Other: "I had a miscarriage before I had my 2 sons." 08/10/2018  ? L. [place], RN, BSN   ?                                                                                             ? ?Social Determinants of Health  ? ?Financial Resource Strain: Not on file  ?Food Insecurity: Not on file  ?Transportation Needs: Not on file  ?Physical Activity: Not on file  ?Stress: Not on file  ?Social Connections: Not on file  ?Intimate Partner Violence: Not on file  ? ? ?Review of Systems: ?ROS negative except for what is noted on the assessment and plan. ? ?Vitals:  ? 12/23/21 1407  ?BP: 120/73  ?Pulse: 95  ?Temp: 98.2 ?F (36.8 ?C)  ?TempSrc: Oral  ?SpO2: 99%  ?Weight: 118 lb 14.4 oz (53.9 kg)  ?Height: 5\' 2"  (1.575 m)  ? ? ? ?Physical Exam: ?Constitutional: normal -appearing woman sitting in the chair, in no acute distress ?HENT: normocephalic atraumatic, mucous membranes moist ?Eyes: conjunctiva non-erythematous ?Neck: supple ?Cardiovascular: regular rate and rhythm, no m/r/g; radial pulses intact ?Pulmonary/Chest: normal work of breathing  on room air, lungs clear to  auscultation bilaterally ?Abdominal: soft, non-tender, non-distended ?MSK: normal bulk and tone; swelling noted of the left wrist joint and anterior forearm; discoloration noted of the wrist;  ?Neurological: alert & oriented x 3, 5/5 strength in bilateral upper and lower extremities, normal gait ?Skin: warm and dry ?Psych: Normal behavior  ? ? ?Assessment & Plan:  ? ?See Encounters Tab for problem based charting. ? ?Patient discussed with Dr. Dareen Piano ?Timothy Lasso, M.D. ?[facility] Internal Medicine, PGY-1 ?Pager: [phone], Phone: [phone] ?Date 12/23/2021 Time 4:34 PM ? ?

## 2021-12-23 NOTE — Patient Instructions (Addendum)
Please take Norco as directed on the bottle ? ?Do not take Tramadol or Tylenol with the Norco medication ? ?I will call with your results of the X-ray ? ?Please do not hesitate to come back if your pain worsens ? ?Please wear a wrist brace for support ?

## 2021-12-23 NOTE — Progress Notes (Signed)
Patient called the after visit number with her spouse stating that they were unable to get the Norco 5-325 mg at the pharmacy. They were informed by the pharmacist that they do not have the specific dose.  Patient stated they were frustrated and requested a pharmacist to call a different pharmacy that has the medication however the pharmacist stated they need the doctor to resend the prescription.  Reports she is in significant pain and needs the pain medicine today.  Patient also requesting for results of x-ray.  Informed that x-ray has been completed but currently has not been read. ?--Discontinue Norco 5-325 and prescribed Norco 7.5-325 mg every 8 hours as needed for 5 days. ?

## 2021-12-24 ENCOUNTER — Telehealth: Payer: Self-pay | Admitting: *Deleted

## 2021-12-24 DIAGNOSIS — M25532 Pain in left wrist: Secondary | ICD-10-CM

## 2021-12-24 DIAGNOSIS — S52502A Unspecified fracture of the lower end of left radius, initial encounter for closed fracture: Secondary | ICD-10-CM | POA: Diagnosis not present

## 2021-12-24 NOTE — Progress Notes (Signed)
Internal Medicine Clinic Attending ? ?Case discussed with Dr. Jeanice Lim  At the time of the visit.  We reviewed the resident?s history and exam and pertinent patient test results.  I agree with the assessment, diagnosis, and plan of care documented in the resident?s note.  ? ?X-ray shows acute nondisplaced fracture of distal radius.  Case was discussed with Dr. Marva Panda who is covering for Dr. Jeanice Lim today.  She will contact the patient to inform her of the results as well as put in an urgent referral to orthopedics to have them evaluate the patient ?

## 2021-12-24 NOTE — Telephone Encounter (Signed)
Received call from [facility] with Radiology reporting results of left wrist x-ray taken yesterday: ? ?IMPRESSION: ?Acute nondisplaced fracture of the distal radial metaphysis. ?

## 2021-12-24 NOTE — Telephone Encounter (Signed)
Results discussed with patient. Will continue pain management and referred urgently to orthopedics ?

## 2021-12-31 ENCOUNTER — Telehealth: Payer: Self-pay | Admitting: Family Medicine

## 2021-12-31 DIAGNOSIS — S52502D Unspecified fracture of the lower end of left radius, subsequent encounter for closed fracture with routine healing: Secondary | ICD-10-CM | POA: Diagnosis not present

## 2021-12-31 DIAGNOSIS — M25561 Pain in right knee: Secondary | ICD-10-CM | POA: Diagnosis not present

## 2021-12-31 NOTE — Telephone Encounter (Signed)
Pt reporting Saturday;2/25, getting in van slipped came down, door hit my knee. Tuesday 2/28, going to the gas station and lost balance landed on hand, broken between thumb and wrist. ?

## 2021-12-31 NOTE — Telephone Encounter (Signed)
Noted. Verified with the phone staff that patient was calling to provide FYI and no call back was needed.  ? ?

## 2022-02-05 DIAGNOSIS — S52502D Unspecified fracture of the lower end of left radius, subsequent encounter for closed fracture with routine healing: Secondary | ICD-10-CM | POA: Diagnosis not present

## 2022-02-11 ENCOUNTER — Other Ambulatory Visit: Payer: Self-pay | Admitting: Internal Medicine

## 2022-02-11 DIAGNOSIS — I10 Essential (primary) hypertension: Secondary | ICD-10-CM

## 2022-03-16 DIAGNOSIS — S52502D Unspecified fracture of the lower end of left radius, subsequent encounter for closed fracture with routine healing: Secondary | ICD-10-CM | POA: Diagnosis not present

## 2022-03-25 ENCOUNTER — Encounter: Payer: Self-pay | Admitting: Hematology and Oncology

## 2022-04-01 ENCOUNTER — Ambulatory Visit: Payer: Medicare HMO | Admitting: Neurology

## 2022-04-01 ENCOUNTER — Encounter: Payer: Self-pay | Admitting: Neurology

## 2022-04-01 VITALS — BP 121/77 | HR 107 | Ht 62.0 in | Wt 118.5 lb

## 2022-04-01 DIAGNOSIS — G35 Multiple sclerosis: Secondary | ICD-10-CM | POA: Diagnosis not present

## 2022-04-01 DIAGNOSIS — R269 Unspecified abnormalities of gait and mobility: Secondary | ICD-10-CM

## 2022-04-01 DIAGNOSIS — M549 Dorsalgia, unspecified: Secondary | ICD-10-CM | POA: Diagnosis not present

## 2022-04-01 DIAGNOSIS — G35D Multiple sclerosis, unspecified: Secondary | ICD-10-CM

## 2022-04-01 DIAGNOSIS — G8929 Other chronic pain: Secondary | ICD-10-CM | POA: Diagnosis not present

## 2022-04-01 DIAGNOSIS — Z79899 Other long term (current) drug therapy: Secondary | ICD-10-CM | POA: Diagnosis not present

## 2022-04-01 DIAGNOSIS — M546 Pain in thoracic spine: Secondary | ICD-10-CM

## 2022-04-01 DIAGNOSIS — Z170 Estrogen receptor positive status [ER+]: Secondary | ICD-10-CM

## 2022-04-01 DIAGNOSIS — C50411 Malignant neoplasm of upper-outer quadrant of right female breast: Secondary | ICD-10-CM | POA: Diagnosis not present

## 2022-04-01 MED ORDER — CYCLOBENZAPRINE HCL 5 MG PO TABS: 5.0000 mg | ORAL_TABLET | Freq: Two times a day (BID) | ORAL | 5 refills | Status: DC | PRN

## 2022-04-01 NOTE — Progress Notes (Addendum)
[facility]  PATIENT: Debbie Watts DOB: 05-10-1961    HISTORICAL  CHIEF COMPLAINT:  Chief Complaint  Patient presents with   Follow-up    Rm 1, alone. Here for 6 month MS f/u, on DF and tolerating well. MS stable. No new sx. Pt has had 2 falls. Pt recently fell and broke her L wrist.     HISTORY OF PRESENT ILLNESS:  Debbie Watts is a 61 y.o. woman with relapsing remitting multiple sclerosis.  Update 04/01/2022 She fell and broke her left  wrist.   She was at the gas station using her cane when she lost her balance and fell.   She had a cast.   It has healed now.  It is still a little sore.     She is on Vumerity and tolerates it well.   No definite exacerbation or new neurologic symptoms.    Last lymphocyte count was 0.8 09/2021  She is walking about the same and has reduced balance.  She can walk  20-25 minutes but then needs a rest due to LBP   She also has LBP if she stands up 20 minutes.    As she walks longer, her back hurts more.    Her right leg is weaker than her left and more clumsy.   Arms are more symmetric.   She has dysesthesias in her legs/feet.   Also back pain.   Bladder function is better,   Vision is fine.      Lumbar MRI 2020 showed DJD but no spinal stenosis.    Headaches have done better the last few months.    She takes a tramadol when one occurs.  She has frequent headaches arising form the left occiput.   Mood is doing better.   She notes some fatigue.  She sleeps ok most nights but some sleep maintenance insomnia.      She was diagnosed with breast cancer January 2020.   Shedid TCHP (docetaxel, carboplatin, trastuzumab, pertuzumab (Perjeta)).   She has had 5 cycles of chemotherapy and her last infusion will be June 2.      She is on anastrozole now.    She sees  Dr. Lindi Adie twice a year.      MS history:  She was diagnosed with MS in 1999 based on MRI and lumbar rupture.  At that time she was living in [place] and was seeing Dr. Tilden Fossa.   When he retired she saw another doctor and then she moved to this area around 2015 but did not seek neurologic referral until 2015.  She was on Avonex for many years but stopped due to insurance.  Then, in November 2016, she started in the drug study and was on ALKS 8700 4 2 years.  The study ended in late 2018 for her and she switched over to [place].      Images: MRI brain showed multiple T2/FLAIR hyperintense foci in the hemispheres in a pattern and configuration consistent with chronic demyelinating plaque associated with multiple sclerosis.  None of the foci appear to be acute.  They do not enhance.  Compared to the MRI dated 08/09/2017, there are no new lesions.  MRI cervical spine showed 3 T2 hyperintense foci within the spinal cord adjacent to C2, C4-C5 and C6-C7.  All of these were present on the 2015 MRI.  They do not enhance.  They are consistent with chronic demyelinating plaque associated with multiple sclerosis.   Moderate spinal stenosis at C5-C6  and mild spinal stenosis at C4-C5 and C6-C7.  There does not appear to be any nerve root compression at these levels.  Degenerative changes at C5-C6 have mildly progressed compared to the 2015 MRI.  MRI brain 02/19/2020  T2/FLAIR hyperintense foci in the hemispheres in a pattern and configuration consistent with chronic demyelinating plaque associated with multiple sclerosis.  None of the foci appear to be acute.  They do not enhance.  Compared to the MRI dated 08/09/2017, there are no new lesions  MRI lumbar 09/24/2019 showed  mild DJD but no spinal stenosis.  No   REVIEW OF SYSTEMS: Constitutional: No fevers, chills, sweats, or change in appetite.  She has fatigue.  She sometimes has insomnia. Eyes: No visual changes, double vision, eye pain Ear, nose and throat: No hearing loss, ear pain, nasal congestion, sore throat Cardiovascular: No chest pain, palpitations Respiratory:  No shortness of breath at rest or with exertion.   No  wheezes GastrointestinaI: No nausea, vomiting, diarrhea, abdominal pain, fecal incontinence Genitourinary: She has urinary frequency and nocturia . Musculoskeletal:  No neck pain, back pain Integumentary: No rash, pruritus, skin lesions Neurological: as above Psychiatric: No depression at this time.  No anxiety Endocrine: No palpitations, diaphoresis, change in appetite, change in weigh or increased thirst Hematologic/Lymphatic:  No anemia, purpura, petechiae. Allergic/Immunologic: No itchy/runny eyes, nasal congestion, recent allergic reactions, rashes  ALLERGIES: Allergies  Allergen Reactions   Procaine Shortness Of Breath    HOME MEDICATIONS:  Current Outpatient Medications:    albuterol (VENTOLIN HFA) 108 (90 Base) MCG/ACT inhaler, Inhale 2 puffs into the lungs every 6 (six) hours as needed for wheezing or shortness of breath., Disp: 8 g, Rfl: 2   anastrozole (ARIMIDEX) 1 MG tablet, Take 1 tablet (1 mg total) by mouth daily., Disp: 90 tablet, Rfl: 3   aspirin-acetaminophen-caffeine (EXCEDRIN MIGRAINE) 250-250-65 MG tablet, Take 2 tablets by mouth every 6 (six) hours as needed for migraine., Disp: , Rfl:    benzonatate (TESSALON PERLES) 100 MG capsule, Take 1 capsule (100 mg total) by mouth 3 (three) times daily as needed for cough., Disp: 30 capsule, Rfl: 1   carbidopa-levodopa (SINEMET IR) 10-100 MG tablet, TAKE 1 TABLET BY MOUTH EVERYDAY AT BEDTIME, Disp: 90 tablet, Rfl: 3   fluticasone (FLONASE) 50 MCG/ACT nasal spray, PLACE 2 SPRAYS INTO BOTH NOSTRILS AT BEDTIME., Disp: 48 mL, Rfl: 2   gabapentin (NEURONTIN) 300 MG capsule, TAKE 2 CAPSULES (600 MG TOTAL) BY MOUTH 2 (TWO) TIMES DAILY., Disp: 360 capsule, Rfl: 3   lisinopril (ZESTRIL) 5 MG tablet, TAKE 1 TABLET BY MOUTH EVERY DAY, Disp: 90 tablet, Rfl: 1   meloxicam (MOBIC) 7.5 MG tablet, Take 1 tablet (7.5 mg total) by mouth daily., Disp: 30 tablet, Rfl: 0   Multiple Vitamin (MULTIVITAMIN WITH MINERALS) TABS tablet, Take 2 tablets  by mouth daily., Disp: , Rfl:    omeprazole (PRILOSEC OTC) 20 MG tablet, Take 20 mg by mouth daily as needed (acid reflux)., Disp: , Rfl:    PARoxetine (PAXIL) 20 MG tablet, TAKE 1 TABLET BY MOUTH EVERY DAY, Disp: 90 tablet, Rfl: 1   traMADol (ULTRAM) 50 MG tablet, Take 1 tablet by mouth 2 (two) times daily as needed., Disp: , Rfl:    valACYclovir (VALTREX) 1000 MG tablet, Take 1 tablet (1,000 mg total) by mouth 2 (two) times daily., Disp: 14 tablet, Rfl: 0   VUMERITY 231 MG CPDR, TAKE 1 CAPSULE ('231MG'$ ) BY MOUTH TWICE DAILY FOR THE FIRST 7 DAYS, THEN TAKE  2 CAPSULES ('462MG'$ ) TWICE DAILY THEREAFTER, Disp: 106 capsule, Rfl: 0   cyclobenzaprine (FLEXERIL) 5 MG tablet, Take 1 tablet (5 mg total) by mouth 2 (two) times daily as needed for muscle spasms., Disp: 60 tablet, Rfl: 5  PAST MEDICAL HISTORY: Past Medical History:  Diagnosis Date   Arthritis    Asthma    Cancer ([place])    chemo for breast ca  x80mo   Depression    Deviated nasal septum    Hearing loss 06/11/2014   Bilateral   Herniated cervical disc    Hypertension    Leukocytosis 01/19/2019   Migraine    Miscarriage    2   [place]    Shingles 12/24/2019   Urinary tract infection without hematuria 08/10/2018    PAST SURGICAL HISTORY: Past Surgical History:  Procedure Laterality Date   BREAST BIOPSY Right 11/21/2018   Malignant   HEMATOMA EVACUATION Right 05/01/2019   Procedure: EVACUATION HEMATOMA;  Surgeon: WRolm Bookbinder MD;  [facility]  Service: General;  Laterality: Right;   MASTECTOMY Right 04/30/2019   MASTECTOMY W/ SENTINEL NODE BIOPSY Right 04/30/2019   Procedure: RIGHT MASTECTOMY WITH  RIGHT AXILLARY SENTINEL LYMPH NODE BIOPSY INJECT BLUE DYE;  Surgeon: WRolm Bookbinder MD;  [facility]  Service: General;  Laterality: Right;   NASAL SEPTUM SURGERY     SIMPLE MASTECTOMY WITH AXILLARY SENTINEL NODE BIOPSY Right 04/30/2019   spinal injections      FAMILY HISTORY: Family History   Adopted: Yes  Problem Relation Age of Onset   Cancer Maternal Uncle        throat    SOCIAL HISTORY:  Social History   Socioeconomic History   Marital status: Single    Spouse name: Not on file   Number of children: 2   Years of education: 12   Highest education level: Not on file  Occupational History   Occupation: Disabled  Tobacco Use   Smoking status: Every Day    Packs/day: 1.00    Years: 44.00    Total pack years: 44.00    Types: Cigarettes   Smokeless tobacco: Never   Tobacco comments:    1 pk per day  Vaping Use   Vaping Use: Former  Substance and Sexual Activity   Alcohol use: Yes    Comment: 0-5 beers daily   Drug use: Yes    Types: Marijuana   Sexual activity: Not Currently    Partners: Male  Other Topics Concern   Not on file  Social History Narrative   Patient is single with 2 children.   Patient is right handed.   Current Social History 08/10/2018        Right handed       Patient lives with significant other ([name] in one level Townhome 08/10/2018    Transportation: Patient has own vehicle  08/10/2018   Important Relationships JHubert Azure10/17/2019    Pets: one dog named Browser 08/10/2018   Education / Work:  12th grade/ Disabled 08/10/2018   Interests / Fun: Watching baseball and Nascar 08/10/2018   Current Stressors: Significant other is depressed over his poor health 08/10/2018   Religious / Personal Beliefs: "I believe in God" 08/10/2018   Other: "I had a miscarriage before I had my 2 sons." 08/10/2018   L. DSilvano Rusk RN, BSN  Social Determinants of Health   Financial Resource Strain: Not on file  Food Insecurity: Not on file  Transportation Needs: Not on file  Physical Activity: Not on file  Stress: Not on file  Social Connections: Not on file  Intimate Partner Violence: Not on file     PHYSICAL EXAM  Vitals:   04/01/22  1414  BP: 121/77  Pulse: (!) 107  Weight: 118 lb 8 oz (53.8 kg)  Height: '5\' 2"'$  (1.575 m)    Body mass index is 21.67 kg/m.   General: The patient is well-developed and well-nourished and in no acute distress.   She has tenderness in the left occiput radiating up her skull with deep palpation.  Neurologic Exam  Mental status: The patient is alert and oriented x 3 at the time of the examination. The patient has apparent normal recent and remote memory, with an apparently normal attention span and concentration ability.   Speech is normal.  Cranial nerves: Extraocular movements are full.  Facial strength and sensation was normal.  The trapezius strength was normal bilaterally.  The tongue is midline, and the patient has symmetric elevation of the soft palate. Bilateral hearing deficits are noted.  Motor:  Muscle bulk is normal.   Tone is normal. Strength is  5 / 5 in all 4 extremities.   Sensory: She had slight asymmetry of sensation, reduced on the left to touch and vibration  Coordination: Cerebellar testing shows good finger-nose-finger.  Heel-to-shin is mildly reduced bilaterally.  Gait and station: Station is normal.  Gait is mildly wide.  Tandem gait is wide.  Romberg is negative.  Reflexes: Deep tendon reflexes are symmetric and normal in arms, but increased in legs but no clonus at ankles.        DIAGNOSTIC DATA (LABS, IMAGING, TESTING) - I reviewed patient records, labs, notes, testing and imaging myself where available.  Lab Results  Component Value Date   WBC 6.9 09/24/2021   HGB 14.1 09/24/2021   HCT 42.1 09/24/2021   MCV 94 09/24/2021   PLT 255 09/24/2021      Component Value Date/Time   NA 138 09/24/2021 1319   K 4.1 09/24/2021 1319   CL 100 09/24/2021 1319   CO2 29 09/24/2021 1319   GLUCOSE 101 (H) 09/24/2021 1319   GLUCOSE 93 12/12/2019 1330   BUN 10 09/24/2021 1319   CREATININE 0.82 09/24/2021 1319   CREATININE 0.68 12/12/2019 1330   CALCIUM 8.8  09/24/2021 1319   PROT 6.6 09/24/2021 1319   ALBUMIN 4.6 09/24/2021 1319   AST 22 09/24/2021 1319   AST 35 12/12/2019 1330   ALT 20 09/24/2021 1319   ALT 44 12/12/2019 1330   ALKPHOS 90 09/24/2021 1319   BILITOT 0.5 09/24/2021 1319   BILITOT 0.4 12/12/2019 1330   GFRNONAA 97 06/02/2020 1422   GFRNONAA >60 12/12/2019 1330   GFRAA 112 06/02/2020 1422   GFRAA >60 12/12/2019 1330   Lab Results  Component Value Date   CHOL 127 06/02/2020   HDL 55 06/02/2020   LDLCALC 56 06/02/2020   TRIG 82 06/02/2020   CHOLHDL 2.3 06/02/2020   Lab Results  Component Value Date   HGBA1C 5.3 12/07/2017   ________________________________________________  Multiple sclerosis ([place]) - Plan: CBC with Differential/Platelet, Comprehensive metabolic panel  Chronic left-sided thoracic back pain - Plan: cyclobenzaprine (FLEXERIL) 5 MG tablet  High risk medication use - Plan: CBC with Differential/Platelet, Comprehensive metabolic panel  Malignant neoplasm of upper-outer quadrant of right breast in  female, estrogen receptor positive ([place])  Gait disturbance  Chronic midline back pain, unspecified back location   1.   Continue Vumerity.   Check CBC/Diff and CMP 2.   Refill Flexeril.   3.   Stay active and exercise as tolerated. 4.   She has some facet hypertrophy likely explaining back pain.  If back pain worsens, consider repeat Lumbar spine MRI (if worsens do c/s contrast due to breast cancer history) 5.    rtc in 6 months or sooner if new or worsening issues. Keashia Haskins A. Felecia Shelling, MD, [facility] 12/23/4968, 2:63 PM Certified in Neurology, Clinical Neurophysiology, Sleep Medicine, Pain Medicine and Neuroimaging  [facility] [address] [place], [place] [address] [phone]

## 2022-04-02 LAB — COMPREHENSIVE METABOLIC PANEL
ALT: 20 IU/L (ref 0–32)
AST: 20 IU/L (ref 0–40)
Albumin/Globulin Ratio: 2 (ref 1.2–2.2)
Albumin: 4.2 g/dL (ref 3.8–4.9)
Alkaline Phosphatase: 96 IU/L (ref 44–121)
BUN/Creatinine Ratio: 11 (ref 9–23)
BUN: 8 mg/dL (ref 6–24)
Bilirubin Total: 0.3 mg/dL (ref 0.0–1.2)
CO2: 26 mmol/L (ref 20–29)
Calcium: 8.4 mg/dL — ABNORMAL LOW (ref 8.7–10.2)
Chloride: 103 mmol/L (ref 96–106)
Creatinine, Ser: 0.76 mg/dL (ref 0.57–1.00)
Globulin, Total: 2.1 g/dL (ref 1.5–4.5)
Glucose: 97 mg/dL (ref 70–99)
Potassium: 3.5 mmol/L (ref 3.5–5.2)
Sodium: 142 mmol/L (ref 134–144)
Total Protein: 6.3 g/dL (ref 6.0–8.5)
eGFR: 90 mL/min/{1.73_m2} (ref >59)

## 2022-04-02 LAB — CBC WITH DIFFERENTIAL/PLATELET
Basophils Absolute: 0 10*3/uL (ref 0.0–0.2)
Basos: 1 %
EOS (ABSOLUTE): 0.1 10*3/uL (ref 0.0–0.4)
Eos: 2 %
Hematocrit: 41 % (ref 34.0–46.6)
Hemoglobin: 14.1 g/dL (ref 11.1–15.9)
Immature Grans (Abs): 0 10*3/uL (ref 0.0–0.1)
Immature Granulocytes: 0 %
Lymphocytes Absolute: 0.5 10*3/uL — ABNORMAL LOW (ref 0.7–3.1)
Lymphs: 12 %
MCH: 31.5 pg (ref 26.6–33.0)
MCHC: 34.4 g/dL (ref 31.5–35.7)
MCV: 92 fL (ref 79–97)
Monocytes Absolute: 0.5 10*3/uL (ref 0.1–0.9)
Monocytes: 12 %
Neutrophils Absolute: 3.1 10*3/uL (ref 1.4–7.0)
Neutrophils: 73 %
Platelets: 233 10*3/uL (ref 150–450)
RBC: 4.47 x10E6/uL (ref 3.77–5.28)
RDW: 12.6 % (ref 11.7–15.4)
WBC: 4.3 10*3/uL (ref 3.4–10.8)

## 2022-04-05 ENCOUNTER — Telehealth: Payer: Self-pay | Admitting: *Deleted

## 2022-04-05 ENCOUNTER — Encounter: Payer: Self-pay | Admitting: Neurology

## 2022-04-05 NOTE — Telephone Encounter (Signed)
-----   Message from Britt Bottom, MD sent at 04/02/2022  8:41 AM EDT ----- Please call her to explain:  The lymphocyte counts were low again.  I would like you to lower the dose of the dimethyl fumarate.  Instead of taking 1 pill twice a day, change it to take 1 pill twice a day 4 days a week and 1 pill once a day 3 days a week.  Hopefully that will get the lymphocyte count back into the normal range.

## 2022-04-05 NOTE — Telephone Encounter (Signed)
Pt returned call and I was able to review the results and change in medication dosing with the patient. Pt verbalized understanding. She asked I send this change of the dosing in a mychart message as well, which I did.  Pt verbalized understanding. Pt had no questions at this time but was encouraged to call back if questions arise.

## 2022-04-05 NOTE — Telephone Encounter (Signed)
LVM for pt to call about results. °

## 2022-04-07 ENCOUNTER — Ambulatory Visit (INDEPENDENT_AMBULATORY_CARE_PROVIDER_SITE_OTHER): Payer: Medicare HMO

## 2022-04-07 ENCOUNTER — Ambulatory Visit
Admission: RE | Admit: 2022-04-07 | Discharge: 2022-04-07 | Disposition: A | Discharge status: Home / Self Care | Payer: Medicare HMO | Source: Ambulatory Visit | Attending: Urgent Care | Admitting: Urgent Care

## 2022-04-07 VITALS — BP 107/72 | HR 91 | Temp 98.1°F | Resp 20

## 2022-04-07 DIAGNOSIS — M542 Cervicalgia: Secondary | ICD-10-CM

## 2022-04-07 DIAGNOSIS — S161XXA Strain of muscle, fascia and tendon at neck level, initial encounter: Secondary | ICD-10-CM | POA: Diagnosis not present

## 2022-04-07 DIAGNOSIS — G35 Multiple sclerosis: Secondary | ICD-10-CM

## 2022-04-07 DIAGNOSIS — M62838 Other muscle spasm: Secondary | ICD-10-CM
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
## 2022-04-07 MED ORDER — PREDNISONE 50 MG PO TABS: 50.0000 mg | ORAL_TABLET | Freq: Every day | ORAL | 0 refills | Status: DC
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
## 2022-04-12 ENCOUNTER — Encounter: Payer: Medicare HMO | Admitting: Internal Medicine

## 2022-04-12 ENCOUNTER — Telehealth: Payer: Self-pay | Admitting: *Deleted

## 2022-04-12 NOTE — Telephone Encounter (Signed)
Spoke with patient regarding her missed appointment. Patient to call the main number to get appointment at [phone].

## 2022-04-13 ENCOUNTER — Encounter: Payer: Self-pay | Admitting: Internal Medicine

## 2022-04-19 ENCOUNTER — Other Ambulatory Visit: Payer: Self-pay | Admitting: Internal Medicine

## 2022-04-19 ENCOUNTER — Other Ambulatory Visit: Payer: Self-pay | Admitting: Neurology

## 2022-04-19 DIAGNOSIS — F339 Major depressive disorder, recurrent, unspecified: Secondary | ICD-10-CM

## 2022-04-30 ENCOUNTER — Other Ambulatory Visit: Payer: Self-pay | Admitting: Neurology

## 2022-04-30 NOTE — Telephone Encounter (Signed)
Pt is requesting a refill for traMADol (ULTRAM) 50 MG tablet.  Pharmacy:  [id number]

## 2022-05-03 ENCOUNTER — Other Ambulatory Visit: Payer: Self-pay | Admitting: Neurology

## 2022-05-03 ENCOUNTER — Other Ambulatory Visit: Payer: Self-pay

## 2022-05-03 ENCOUNTER — Encounter: Payer: Medicare HMO | Admitting: Student

## 2022-05-03 MED ORDER — TRAMADOL HCL 50 MG PO TABS: 50.0000 mg | ORAL_TABLET | Freq: Two times a day (BID) | ORAL | 3 refills | Status: DC | PRN

## 2022-05-03 MED ORDER — TRAMADOL HCL 50 MG PO TABS
50.0000 mg | ORAL_TABLET | Freq: Two times a day (BID) | ORAL | 0 refills | Status: DC | PRN
Start: 2022-05-03 — End: 2022-05-03

## 2022-05-03 NOTE — Telephone Encounter (Signed)
Pt is calling and wanting to know when her traMADol (ULTRAM) 50 MG tablet will be sent to the pharmacy. Pt states she really needs this medication and need to know what's going on with getting a refill.

## 2022-05-03 NOTE — Telephone Encounter (Signed)
Last OV was on 04/01/22.  Next OV is scheduled for 10/07/22.  Last RX was written on 03/27/22 for 60 tabs.   [place] [facility] Database has been reviewed.

## 2022-05-03 NOTE — Progress Notes (Unsigned)
Transmission failed. Can you please resend electronically.

## 2022-05-03 NOTE — Addendum Note (Signed)
Addended by: Georgiann Cocker on: 05/03/2022 07:13 AM   Modules accepted: Orders

## 2022-05-04 NOTE — Progress Notes (Signed)
Was resent by Dr. Felecia Shelling yesterday and failed again. Rx was printed, signed, and faxed to pharmacy instead. Fax confirmation received.

## 2022-05-07 ENCOUNTER — Ambulatory Visit: Payer: Medicare HMO

## 2022-05-14 ENCOUNTER — Other Ambulatory Visit: Payer: Self-pay | Admitting: Student

## 2022-05-25 ENCOUNTER — Other Ambulatory Visit: Payer: Self-pay | Admitting: Student

## 2022-05-25 DIAGNOSIS — J449 Chronic obstructive pulmonary disease, unspecified: Secondary | ICD-10-CM

## 2022-05-25 NOTE — Telephone Encounter (Signed)
Patient sent message via my chart to contact our office to schedule an appointment this month with Dr. Raymondo Band.

## 2022-06-02 ENCOUNTER — Other Ambulatory Visit: Payer: Self-pay

## 2022-06-02 NOTE — Patient Outreach (Signed)
[facility]) Care Management  06/02/2022  Debbie Watts 10-10-1961 [id number]   Telephone call to patient for nurse call.  No answer.  HIPAA compliant voice message left.    Plan: RN CM will attempt again within 4 business days and send letter.   Jone Baseman, RN, MSN [facility] Care Management Care Management Coordinator Direct Line [phone] Toll Free: [phone]  Fax: [phone]

## 2022-06-03 ENCOUNTER — Telehealth: Payer: Self-pay | Admitting: Family Medicine

## 2022-06-03 NOTE — Telephone Encounter (Signed)
LVM and sent mychart msg informing pt of r/s needed for 12/14 appt- Amy out.

## 2022-06-06 ENCOUNTER — Other Ambulatory Visit: Payer: Self-pay | Admitting: Neurology

## 2022-06-07 NOTE — Telephone Encounter (Signed)
Last OV was on 04/01/22.  Next OV is scheduled for 10/11/22.  Last RX was written on 05/04/22 for 60 tabs.   [place] Drug Database has been reviewed.

## 2022-06-08 ENCOUNTER — Other Ambulatory Visit: Payer: Self-pay

## 2022-06-08 NOTE — Patient Outreach (Signed)
[facility]) Care Management  06/08/2022  Chantil Bari Oct 05, 1961 [id number]   Telephone call to patient for nurse call.  No answer.  HIPAA compliant voice message left.    Plan: RN CM will attempt again within 4 business days.  Jone Baseman, RN, MSN [facility] Care Management Care Management Coordinator Direct Line [phone] Toll Free: [phone]  Fax: [phone]

## 2022-06-09 ENCOUNTER — Other Ambulatory Visit: Payer: Self-pay | Admitting: Hematology and Oncology

## 2022-06-09 DIAGNOSIS — Z1231 Encounter for screening mammogram for malignant neoplasm of breast: Secondary | ICD-10-CM

## 2022-06-11 ENCOUNTER — Other Ambulatory Visit: Payer: Self-pay

## 2022-06-11 NOTE — Patient Outreach (Signed)
[facility]) Care Management  06/11/2022  Debbie Watts [birth year] [id number]   Telephone call to patient for nurse call.  No answer.  HIPAA compliant voice message left.    Plan: RN CM will attempt again within 3 weeks.    Jone Baseman, RN, MSN [facility] Care Management Care Management Coordinator Direct Line [phone] Toll Free: [phone]  Fax: [phone]

## 2022-06-16 ENCOUNTER — Ambulatory Visit (INDEPENDENT_AMBULATORY_CARE_PROVIDER_SITE_OTHER): Payer: Medicare HMO | Admitting: Internal Medicine

## 2022-06-16 ENCOUNTER — Encounter: Payer: Self-pay | Admitting: Internal Medicine

## 2022-06-16 VITALS — BP 135/80 | HR 72 | Temp 98.6°F | Ht 62.0 in | Wt 122.4 lb

## 2022-06-16 DIAGNOSIS — H9193 Unspecified hearing loss, bilateral: Secondary | ICD-10-CM

## 2022-06-16 DIAGNOSIS — I10 Essential (primary) hypertension: Secondary | ICD-10-CM

## 2022-06-16 DIAGNOSIS — R233 Spontaneous ecchymoses: Secondary | ICD-10-CM | POA: Diagnosis not present

## 2022-06-16 DIAGNOSIS — Z0000 Encounter for general adult medical examination without abnormal findings: Secondary | ICD-10-CM

## 2022-06-16 DIAGNOSIS — C50911 Malignant neoplasm of unspecified site of right female breast: Secondary | ICD-10-CM

## 2022-06-16 DIAGNOSIS — F17210 Nicotine dependence, cigarettes, uncomplicated: Secondary | ICD-10-CM

## 2022-06-16 LAB — CBC WITH DIFFERENTIAL/PLATELET
Abs Immature Granulocytes: 0.01 10*3/uL (ref 0.00–0.07)
Basophils Absolute: 0.1 10*3/uL (ref 0.0–0.1)
Basophils Relative: 1 %
Eosinophils Absolute: 0.1 10*3/uL (ref 0.0–0.5)
Eosinophils Relative: 2 %
HCT: 40.9 % (ref 36.0–46.0)
Hemoglobin: 13.8 g/dL (ref 12.0–15.0)
Immature Granulocytes: 0 %
Lymphocytes Relative: 15 %
Lymphs Abs: 0.6 10*3/uL — ABNORMAL LOW (ref 0.7–4.0)
MCH: 32.2 pg (ref 26.0–34.0)
MCHC: 33.7 g/dL (ref 30.0–36.0)
MCV: 95.6 fL (ref 80.0–100.0)
Monocytes Absolute: 0.5 10*3/uL (ref 0.1–1.0)
Monocytes Relative: 12 %
Neutro Abs: 2.7 10*3/uL (ref 1.7–7.7)
Neutrophils Relative %: 70 %
Platelets: 238 10*3/uL (ref 150–400)
RBC: 4.28 MIL/uL (ref 3.87–5.11)
RDW: 14 % (ref 11.5–15.5)
WBC: 3.8 10*3/uL — ABNORMAL LOW (ref 4.0–10.5)
nRBC: 0 % (ref 0.0–0.2)

## 2022-06-16 LAB — TECHNOLOGIST SMEAR REVIEW

## 2022-06-16 LAB — PROTIME-INR
INR: 1 (ref 0.8–1.2)
Prothrombin Time: 12.6 seconds (ref 11.4–15.2)

## 2022-06-16 MED ORDER — LISINOPRIL 10 MG PO TABS: 10.0000 mg | ORAL_TABLET | Freq: Every day | ORAL | 11 refills | Status: DC

## 2022-06-16 NOTE — Patient Instructions (Signed)
Thank you, Debbie Watts for allowing [place] to provide your care today. Today we discussed:  High blood pressure: Increase your lisinopril to 10 mg daily (you can take 2 tablets of your 5 mg pills until you run out).   Please get your second shingles vaccine at your local pharmacy  I have referred you to plastic surgery for breast reconstruction surgery (call your insurance company before to see if they will cover this)  Call [facility] regarding hearing aids and your hearing test: [phone]  Easy bruising: we are doing some labs today  I have ordered the following labs for you:  Lab Orders         CBC no Diff         Protime-INR         Technologist smear review        Referrals ordered today:   Referral Orders         Ambulatory referral to Plastic Surgery       I have ordered the following medication/changed the following medications:   Stop the following medications: Medications Discontinued During This Encounter  Medication Reason   predniSONE (DELTASONE) 50 MG tablet    meloxicam (MOBIC) 7.5 MG tablet    benzonatate (TESSALON PERLES) 100 MG capsule    lisinopril (ZESTRIL) 5 MG tablet      Start the following medications: Meds ordered this encounter  Medications   lisinopril (ZESTRIL) 10 MG tablet    Sig: Take 1 tablet (10 mg total) by mouth daily.    Dispense:  30 tablet    Refill:  11     Follow up: 6 months     Should you have any questions or concerns please call the internal medicine clinic at [phone].     Buddy Duty, D.O. [place]

## 2022-06-16 NOTE — Assessment & Plan Note (Signed)
BP today elevated to 137/97, improved to 135/80 upon recheck. Goal systolic BP is <097, thus we discussed increasing her lisinopril dose today.  The patient denies any headaches, vision changes, lightheadedness, dizziness, chest pain, palpitations, or shortness of breath.  Plan: - Increase lisinopril to 10 mg daily

## 2022-06-16 NOTE — Assessment & Plan Note (Signed)
The patient states that she has had easy bruising on her forearms and legs for years.  She will sometimes stumble into things which causes bruising and she also has 2 dogs who bumped into her, which causes scratches and bruises. Patient denies any easy/heavy bleeding and she does not know if she has any family history of bleeding disorders, as she was adopted.   Plan: - CBC, PT-INR, and blood smear today - If labs are normal, could just be purpura simplex (easy bruising)

## 2022-06-16 NOTE — Addendum Note (Signed)
Addended by: Truddie Crumble on: 06/16/2022 03:16 PM   Modules accepted: Orders

## 2022-06-16 NOTE — Assessment & Plan Note (Signed)
The patient has chronic hearing loss and has been following with ENT/audiology at [facility].  At her previous office visit, she was noted to be a candidate for hearing aids, but has not gotten any nor has she heard about this.  I have provided the patient with the phone number for [facility] and she will get in touch with them regarding her yearly audiogram and hearing aids

## 2022-06-16 NOTE — Assessment & Plan Note (Addendum)
-   Patient will go to her local pharmacy for 2nd shingrix vaccine - Fecal occult test done within the last year

## 2022-06-16 NOTE — Progress Notes (Signed)
CC: hypertension   HPI:  [name] is a 61 y.o. female living with a history stated below and presents today for a follow up of her hypertension. Please see problem based assessment and plan for additional details.  Past Medical History:  Diagnosis Date   Arthritis    Asthma    Cancer ([place])    chemo for breast ca  x78mo   Depression    Deviated nasal septum    Gait disturbance 01/12/2018   Hearing loss 06/11/2014   Bilateral   Herniated cervical disc    Hypertension    Leukocytosis 01/19/2019   Migraine    Miscarriage    2   MS (multiple sclerosis) ([place]    Shingles 12/24/2019   Sore throat 10/29/2021   Urinary tract infection without hematuria 08/10/2018    Current Outpatient Medications on File Prior to Visit  Medication Sig Dispense Refill   albuterol (VENTOLIN HFA) 108 (90 Base) MCG/ACT inhaler INHALE 1-2 PUFFS BY MOUTH EVERY 6 HOURS AS NEEDED FOR WHEEZE OR SHORTNESS OF BREATH 18 each 1   anastrozole (ARIMIDEX) 1 MG tablet Take 1 tablet (1 mg total) by mouth daily. 90 tablet 3   aspirin-acetaminophen-caffeine (EXCEDRIN MIGRAINE) 250-250-65 MG tablet Take 2 tablets by mouth every 6 (six) hours as needed for migraine.     carbidopa-levodopa (SINEMET IR) 10-100 MG tablet TAKE 1 TABLET BY MOUTH EVERYDAY AT BEDTIME 90 tablet 3   cyclobenzaprine (FLEXERIL) 5 MG tablet Take 1 tablet (5 mg total) by mouth 2 (two) times daily as needed for muscle spasms. 60 tablet 5   fluticasone (FLONASE) 50 MCG/ACT nasal spray PLACE 2 SPRAYS INTO BOTH NOSTRILS AT BEDTIME. 48 mL 2   gabapentin (NEURONTIN) 300 MG capsule TAKE 2 CAPSULES (600 MG TOTAL) BY MOUTH 2 (TWO) TIMES DAILY. 360 capsule 3   Multiple Vitamin (MULTIVITAMIN WITH MINERALS) TABS tablet Take 2 tablets by mouth daily.     omeprazole (PRILOSEC OTC) 20 MG tablet Take 20 mg by mouth daily as needed (acid reflux).     PARoxetine (PAXIL) 20 MG tablet TAKE 1 TABLET BY MOUTH EVERY DAY 90 tablet 1   traMADol (ULTRAM) 50 MG tablet  TAKE 1 TABLET BY MOUTH TWICE A DAY AS NEEDED 60 tablet 0   valACYclovir (VALTREX) 1000 MG tablet Take 1 tablet (1,000 mg total) by mouth 2 (two) times daily. 14 tablet 0   VUMERITY 231 MG CPDR TAKE 1 CAPSULE ('231MG'$ ) BY MOUTH TWICE DAILY FOR THE FIRST 7 DAYS, THEN TAKE 2 CAPSULES ('462MG'$ ) TWICE DAILY THEREAFTER 106 capsule 0   [DISCONTINUED] prochlorperazine (COMPAZINE) 10 MG tablet TAKE 1 TABLET (10 MG TOTAL) BY MOUTH EVERY 6 (SIX) HOURS AS NEEDED (NAUSEA OR VOMITING). 30 tablet 1   No current facility-administered medications on file prior to visit.    Family History  Adopted: Yes  Problem Relation Age of Onset   Cancer Maternal Uncle        throat    Social History   Socioeconomic History   Marital status: Single    Spouse name: Not on file   Number of children: 2   Years of education: 12   Highest education level: Not on file  Occupational History   Occupation: Disabled  Tobacco Use   Smoking status: Every Day    Packs/day: 1.00    Years: 44.00    Total pack years: 44.00    Types: Cigarettes   Smokeless tobacco: Never   Tobacco comments:  1 pk per day  Vaping Use   Vaping Use: Former  Substance and Sexual Activity   Alcohol use: Yes    Comment: 0-5 beers daily   Drug use: Yes    Types: Marijuana   Sexual activity: Not Currently    Partners: Male  Other Topics Concern   Not on file  Social History Narrative   Patient is single with 2 children.   Patient is right handed.   Current Social History 08/10/2018        Right handed       Patient lives with significant other Debbie Watts) in one level Townhome 08/10/2018    Transportation: Patient has own vehicle  08/10/2018   Important Relationships Debbie Watts 08/10/2018    Pets: one dog named Browser 08/10/2018   Education / Work:  12th grade/ Disabled 08/10/2018   Interests / Fun: Watching baseball and Nascar 08/10/2018   Current Stressors: Significant other is depressed over his poor health 08/10/2018    Religious / Personal Beliefs: "I believe in God" 08/10/2018   Other: "I had a miscarriage before I had my 2 sons." 08/10/2018   L. Ducatte, RN, BSN                                                                                                 Social Determinants of Health   Financial Resource Strain: Not on file  Food Insecurity: Not on file  Transportation Needs: Not on file  Physical Activity: Not on file  Stress: Not on file  Social Connections: Not on file  Intimate Partner Violence: Not on file    Review of Systems: ROS negative except for what is noted on the assessment and plan.  Vitals:   06/16/22 1357 06/16/22 1426  BP: (!) 137/97 135/80  Pulse: 83 72  Temp: 98.6 F (37 C)   TempSrc: Oral   SpO2: 100%   Weight: 122 lb 6.4 oz (55.5 kg)   Height: '5\' 2"'$  (1.575 m)     Physical Exam: Constitutional: well-appearing female sitting in chair, in no acute distress Cardiovascular: regular rate and rhythm, no m/r/g Pulmonary/Chest: normal work of breathing on room air, lungs clear to auscultation bilaterally Abdominal: soft, non-tender, non-distended MSK: normal bulk and tone Neurological: alert & oriented x 3, no focal deficit Skin: warm and dry. Scattered areas of ecchymosis on arms and legs Psych: normal mood and behavior  Assessment & Plan:     Patient discussed with Dr. Saverio Danker  Essential hypertension BP today elevated to 137/97, improved to 135/80 upon recheck. Goal systolic BP is <409, thus we discussed increasing her lisinopril dose today.  The patient denies any headaches, vision changes, lightheadedness, dizziness, chest pain, palpitations, or shortness of breath.  Plan: - Increase lisinopril to 10 mg daily  Hearing loss, bilateral The patient has chronic hearing loss and has been following with ENT/audiology at [facility].  At her previous office visit, she was noted to be a candidate for hearing aids, but has not gotten any nor has she heard about this.  I  have provided the patient with  the phone number for [facility] and she will get in touch with them regarding her yearly audiogram and hearing aids  Healthcare maintenance - Patient will go to her local pharmacy for 2nd shingrix vaccine - Fecal occult test done within the last year  Easy bruising The patient states that she has had easy bruising on her forearms and legs for years.  She will sometimes stumble into things which causes bruising and she also has 2 dogs who bumped into her, which causes scratches and bruises. Patient denies any easy/heavy bleeding and she does not know if she has any family history of bleeding disorders, as she was adopted.   Plan: - CBC, PT-INR, and blood smear today - If labs are normal, could just be purpura simplex (easy bruising)   Joseph Johns, D.O. [facility], PGY-2 Phone: [phone] Date 06/16/2022 Time 2:41 PM

## 2022-06-17 ENCOUNTER — Encounter: Payer: Self-pay | Admitting: Internal Medicine

## 2022-06-17 NOTE — Progress Notes (Signed)
Internal Medicine Clinic Attending ° °Case discussed with Dr. Atway  At the time of the visit.  We reviewed the resident’s history and exam and pertinent patient test results.  I agree with the assessment, diagnosis, and plan of care documented in the resident’s note.  °

## 2022-06-17 NOTE — Progress Notes (Signed)
Platelets, INR, and blood smear all are unremarkable and do not give [place] any additional information as to why the patient is having easy bruising. Sent patient a MyChart message about results.

## 2022-06-17 NOTE — Addendum Note (Signed)
Addended by: Charise Killian on: 06/17/2022 11:57 AM   Modules accepted: Level of Service

## 2022-06-23 ENCOUNTER — Other Ambulatory Visit: Payer: Self-pay

## 2022-06-23 NOTE — Patient Outreach (Signed)
[facility]) Care Management  06/23/2022  Debbie Watts [birth year] [id number]   Telephone call to patient for nurse call. Patient reports doing well. Discussed need for annual wellness visit PCP office. Patient states she was waiting to reschedule and would like to schedule.  Advised that CM would contact office to schedule and have them call her.  She verbalized understanding.    Discussed [facility] and ongoing nurse calls. She declined at this time.  Telephone call to Internal Medicine clinic for [facility] to be scheduled. Left message for Tamela Oddi to outreach patient to schedule.    Plan: RN CM will close case.    Jone Baseman, RN, MSN [facility] Care Management Care Management Coordinator Direct Line [phone] Toll Free: [phone]  Fax: [phone]

## 2022-07-06 NOTE — Progress Notes (Signed)
Patient Care Team: Dorethea Clan, DO as PCP - General Excell Seltzer, MD (Inactive) as Consulting Physician (General Surgery) Nicholas Lose, MD as Consulting Physician (Hematology and Oncology) Kyung Rudd, MD as Consulting Physician (Radiation Oncology)  DIAGNOSIS:  Encounter Diagnosis  Name Primary?   Malignant neoplasm of upper-outer quadrant of right breast in female, estrogen receptor positive ([place])     SUMMARY OF ONCOLOGIC HISTORY: Oncology History  Malignant neoplasm of upper-outer quadrant of right breast in female, estrogen receptor positive ([place])  11/21/2018 Initial Diagnosis   Screening detected right breast mass upper outer quadrant, in addition to areas of calcifications which were not biopsied.  By ultrasound she had 2 breast masses 12 o'clock position 2.9 cm: Grade 2 IDC with high-grade DCIS ER 100%, PR 70%, Ki-67 10%, HER-2 3+ by IHC and 11 o'clock position 1 cm, ER 90%, PR 50%, Ki-67 15%, HER-2 3+ by IHC, T2N0 stage Ib   11/29/2018 Cancer Staging   Staging form: Breast, AJCC 8th Edition - Clinical stage from 11/29/2018: Stage IB (cT2, cN0, cM0, G3, ER+, PR+, HER2+) - Signed by Nicholas Lose, MD on 11/29/2018   12/12/2018 -  Neo-Adjuvant Chemotherapy   Neoadjuvant chemotherapy with [facility] Perjeta   01/18/2019 - 01/20/2019 Hospital Admission   Syncope   04/30/2019 Surgery   Right mastectomy: Grade 2 IDC, 3.9 cm, high-grade DCIS, margins are negative, 0/2 lymph nodes negative, ER 95%, PR 80%, HER-2 2+ equivocal, T2 N0   04/30/2019 Cancer Staging   Staging form: Breast, AJCC 8th Edition - Pathologic stage from 04/30/2019: No Stage Recommended (ypT2, pN0, cM0, G2, ER+, PR+, HER2-) - Signed by Gardenia Phlegm, NP on 05/09/2019   05/24/2019 - 12/12/2019 Chemotherapy   Adjuvant Kadcyla   05/24/2019 -  Anti-estrogen oral therapy   Anastrozole 1 mg daily     CHIEF COMPLIANT:  Follow-up of right breast cancer on anastrozole    INTERVAL HISTORY: Debbie Watts is a 61  y.o. with above-mentioned history of  right breast cancer who completed neoadjuvant chemotherapy with [place], underwent a right mastectomy, completed adjuvant Kadcyla, and is currently on anti-estrogen therapy with anastrozole. She presents to the clinic today for follow-up. She states that she feels good. Denies hot flashes. She does experience getting hot at night. She reports archness and stiffness but it ease off sometimes when she move around and sometimes it doesn't. She is having frequently bowel issues. She does have some neuropathy in fingers sometimes but it's manageable.   ALLERGIES:  is allergic to procaine.  MEDICATIONS:  Current Outpatient Medications  Medication Sig Dispense Refill   albuterol (VENTOLIN HFA) 108 (90 Base) MCG/ACT inhaler INHALE 1-2 PUFFS BY MOUTH EVERY 6 HOURS AS NEEDED FOR WHEEZE OR SHORTNESS OF BREATH 18 each 1   anastrozole (ARIMIDEX) 1 MG tablet Take 1 tablet (1 mg total) by mouth daily. 90 tablet 3   aspirin-acetaminophen-caffeine (EXCEDRIN MIGRAINE) 250-250-65 MG tablet Take 2 tablets by mouth every 6 (six) hours as needed for migraine.     carbidopa-levodopa (SINEMET IR) 10-100 MG tablet TAKE 1 TABLET BY MOUTH EVERYDAY AT BEDTIME 90 tablet 3   celecoxib (CELEBREX) 200 MG capsule Take by mouth.     cyclobenzaprine (FLEXERIL) 5 MG tablet Take 1 tablet (5 mg total) by mouth 2 (two) times daily as needed for muscle spasms. 60 tablet 5   fluticasone (FLONASE) 50 MCG/ACT nasal spray PLACE 2 SPRAYS INTO BOTH NOSTRILS AT BEDTIME. 48 mL 2   gabapentin (NEURONTIN) 300 MG capsule  TAKE 2 CAPSULES (600 MG TOTAL) BY MOUTH 2 (TWO) TIMES DAILY. 360 capsule 3   lisinopril (ZESTRIL) 10 MG tablet Take 1 tablet (10 mg total) by mouth daily. 30 tablet 11   Multiple Vitamin (MULTIVITAMIN WITH MINERALS) TABS tablet Take 2 tablets by mouth daily.     omeprazole (PRILOSEC OTC) 20 MG tablet Take 20 mg by mouth daily as needed (acid reflux).     PARoxetine (PAXIL) 20 MG tablet  TAKE 1 TABLET BY MOUTH EVERY DAY 90 tablet 1   predniSONE (DELTASONE) 10 MG tablet SMARTSIG:5 Tablet(s) By Mouth Every Morning     traMADol (ULTRAM) 50 MG tablet TAKE 1 TABLET BY MOUTH TWICE A DAY AS NEEDED 60 tablet 0   valACYclovir (VALTREX) 1000 MG tablet Take 1 tablet (1,000 mg total) by mouth 2 (two) times daily. 14 tablet 0   VUMERITY 231 MG CPDR TAKE 1 CAPSULE (231MG) BY MOUTH TWICE DAILY FOR THE FIRST 7 DAYS, THEN TAKE 2 CAPSULES (462MG) TWICE DAILY THEREAFTER 106 capsule 0   No current facility-administered medications for this visit.    PHYSICAL EXAMINATION: ECOG PERFORMANCE STATUS: 1 - Symptomatic but completely ambulatory  Vitals:   07/12/22 1118  BP: (!) 148/84  Pulse: 87  Resp: 19  Temp: 97.8 F (36.6 C)  SpO2: 95%   Filed Weights   07/12/22 1118  Weight: 122 lb 11.2 oz (55.7 kg)    BREAST: No palpable masses or nodules in either left breast or right chest wall and axilla. No palpable axillary supraclavicular or infraclavicular adenopathy no breast tenderness or nipple discharge. (exam performed in the presence of a chaperone)  LABORATORY DATA:  I have reviewed the data as listed    Latest Ref Rng & Units 04/01/2022    2:59 PM 09/24/2021    1:19 PM 03/19/2021   12:13 PM  CMP  Glucose 70 - 99 mg/dL 97  101  67   BUN 6 - 24 mg/dL '8  10  12   ' Creatinine 0.57 - 1.00 mg/dL 0.76  0.82  0.75   Sodium 134 - 144 mmol/L 142  138  140   Potassium 3.5 - 5.2 mmol/L 3.5  4.1  4.1   Chloride 96 - 106 mmol/L 103  100  101   CO2 20 - 29 mmol/L '26  29  26   ' Calcium 8.7 - 10.2 mg/dL 8.4  8.8  8.7   Total Protein 6.0 - 8.5 g/dL 6.3  6.6  6.5   Total Bilirubin 0.0 - 1.2 mg/dL 0.3  0.5  0.5   Alkaline Phos 44 - 121 IU/L 96  90  101   AST 0 - 40 IU/L '20  22  25   ' ALT 0 - 32 IU/L '20  20  27     ' Lab Results  Component Value Date   WBC 3.8 (L) 06/16/2022   HGB 13.8 06/16/2022   HCT 40.9 06/16/2022   MCV 95.6 06/16/2022   PLT 238 06/16/2022   NEUTROABS 2.7 06/16/2022     ASSESSMENT & PLAN:  Malignant neoplasm of upper-outer quadrant of right breast in female, estrogen receptor positive ([place]) 11/21/2018:Screening detected right breast mass upper outer quadrant, in addition to areas of calcifications which were not biopsied.  By ultrasound she had 2 breast masses 12 o'clock position 2.9 cm: Grade 2 IDC with high-grade DCIS ER 100%, PR 70%, Ki-67 10%, HER-2 3+ by IHC and 11 o'clock position 1 cm, ER 90%, PR 50%, Ki-67 15%, HER-2 3+  by IHC, T2N0 stage Ib   Neoadjuvant chemotherapy with [facility] Perjeta x6 cycles 12/12/2018-03/27/2019   04/30/2019: Right mastectomy: Grade 2 IDC, 3.9 cm, high-grade DCIS, margins are negative, 0/2 lymph nodes negative, ER 95%, PR 80%, HER-2 2+ equivocal, T2 N0   Treatment plan: 1.  Adjuvant Kadcyla started 05/24/2019- 12/12/19 2.  Adjuvant antiestrogen therapy with anastrozole 1 mg p.o. daily x7 years started 05/24/2019  ----------------------------------------------------------------------------------------------------------------------------- Low back pain with pain radiating down the leg: On Percocets 09/24/2019: MRI lumbar spine: Negative for metastatic disease.  Mild degenerative changes without disc protrusion or stenosis 09/24/2019: Bone scan: Nonspecific uptake right 11th rib   Anastrozole toxicities: Denies any hot flashes or myalgias. Shingles: 12/21/19 Completed Valtrex   Breast cancer surveillance: 1. left breast MRI  01/10/2020: No MRI evidence of malignancy in the left breast, status post right mastectomy. 2. breast exam  07/12/2022: Benign 3.  Mammogram left breast  07/09/2022 benign breast density category C     Brain MRI 02/19/2020: Multiple foci of chronic demyelinating plaque associated with multiple sclerosis   Return to clinic in 1 year for follow-up    No orders of the defined types were placed in this encounter.  The patient has a good understanding of the overall plan. she agrees with it. she will call with any  problems that may develop before the next visit here. Total time spent: 30 mins including face to face time and time spent for planning, charting and co-ordination of care   Harriette Ohara, MD 07/12/22    I Gardiner Coins am scribing for Dr. Lindi Adie  I have reviewed the above documentation for accuracy and completeness, and I agree with the above.

## 2022-07-08 ENCOUNTER — Ambulatory Visit
Admission: RE | Admit: 2022-07-08 | Discharge: 2022-07-08 | Disposition: A | Discharge status: Home / Self Care | Payer: Medicare HMO | Source: Ambulatory Visit | Attending: Hematology and Oncology | Admitting: Hematology and Oncology

## 2022-07-08 DIAGNOSIS — Z1231 Encounter for screening mammogram for malignant neoplasm of breast: Secondary | ICD-10-CM

## 2022-07-12 ENCOUNTER — Inpatient Hospital Stay: Payer: Medicare HMO | Attending: Hematology and Oncology | Admitting: Hematology and Oncology

## 2022-07-12 ENCOUNTER — Other Ambulatory Visit: Payer: Self-pay

## 2022-07-12 DIAGNOSIS — Z79811 Long term (current) use of aromatase inhibitors: Secondary | ICD-10-CM | POA: Diagnosis not present

## 2022-07-12 DIAGNOSIS — Z9011 Acquired absence of right breast and nipple: Secondary | ICD-10-CM | POA: Insufficient documentation

## 2022-07-12 DIAGNOSIS — Z79624 Long term (current) use of inhibitors of nucleotide synthesis: Secondary | ICD-10-CM | POA: Diagnosis not present

## 2022-07-12 DIAGNOSIS — Z7952 Long term (current) use of systemic steroids: Secondary | ICD-10-CM | POA: Insufficient documentation

## 2022-07-12 DIAGNOSIS — C50411 Malignant neoplasm of upper-outer quadrant of right female breast: Secondary | ICD-10-CM | POA: Insufficient documentation

## 2022-07-12 DIAGNOSIS — Z170 Estrogen receptor positive status [ER+]: Secondary | ICD-10-CM | POA: Diagnosis not present

## 2022-07-12 DIAGNOSIS — Z79899 Other long term (current) drug therapy: Secondary | ICD-10-CM | POA: Insufficient documentation

## 2022-07-12 DIAGNOSIS — G629 Polyneuropathy, unspecified: Secondary | ICD-10-CM | POA: Diagnosis not present

## 2022-07-12 MED ORDER — ANASTROZOLE 1 MG PO TABS: 1.0000 mg | ORAL_TABLET | Freq: Every day | ORAL | 3 refills | Status: DC

## 2022-07-12 NOTE — Assessment & Plan Note (Signed)
11/21/2018:Screening detected right breast mass upper outer quadrant, in addition to areas of calcifications which were not biopsied. By ultrasound she had 2 breast masses 12 o'clock position 2.9 cm: Grade 2 IDC with high-grade DCIS ER 100%, PR 70%, Ki-67 10%, HER-2 3+ by IHC and 11 o'clock position 1 cm, ER 90%, PR 50%, Ki-67 15%, HER-2 3+ by IHC, T2N0 stage Ib  Neoadjuvant chemotherapy with [facility] Perjeta x6 cycles 12/12/2018-03/27/2019  04/30/2019:Right mastectomy: Grade 2 IDC, 3.9 cm, high-grade DCIS, margins are negative, 0/2 lymph nodes negative, ER 95%, PR 80%, HER-2 2+ equivocal, T2 N0  Treatment plan: 1.Adjuvant Kadcylastarted 05/24/2019- 12/12/19 2.Adjuvant antiestrogen therapy with anastrozole 1 mg p.o. daily x7 yearsstarted 05/24/2019 ----------------------------------------------------------------------------------------------------------------------------- Low back pain with pain radiating down the leg:On Percocets 09/24/2019: MRI lumbar spine: Negative for metastatic disease. Mild degenerative changes withoutdisc protrusion or stenosis 09/24/2019: Bone scan: Nonspecific uptake right 11th rib  Anastrozole toxicities: Denies any hot flashes or myalgias. Shingles: 2/26/21CompletedValtrex  Breast cancer surveillance: 1.left breast MRI 01/10/2020: No MRI evidence of malignancy in the left breast, status post right mastectomy. 2.breast exam  07/12/2022: Benign 3.Mammogram left breast  07/09/2022 benign breast density category C   Brain MRI 02/19/2020: Multiple foci of chronic demyelinating plaque associated with multiple sclerosis  Return to clinic in 1 year for follow-up

## 2022-07-13 ENCOUNTER — Telehealth: Payer: Self-pay | Admitting: Hematology and Oncology

## 2022-07-13 ENCOUNTER — Other Ambulatory Visit: Payer: Self-pay | Admitting: Neurology

## 2022-07-13 NOTE — Telephone Encounter (Signed)
Talked to patient to confirm 9/19 appointment

## 2022-07-15 ENCOUNTER — Telehealth: Payer: Self-pay | Admitting: Neurology

## 2022-07-15 NOTE — Telephone Encounter (Signed)
Pt following up on refill for traMADol (ULTRAM) 50 MG tablet . Informed pt refill is in the physician's que to approve and send to the pharmacy.

## 2022-07-15 NOTE — Telephone Encounter (Signed)
Rx refill routed to Dr. Felecia Shelling to e-scribe for pt.

## 2022-08-11 ENCOUNTER — Other Ambulatory Visit: Payer: Self-pay | Admitting: Internal Medicine

## 2022-08-11 DIAGNOSIS — J449 Chronic obstructive pulmonary disease, unspecified: Secondary | ICD-10-CM

## 2022-08-17 NOTE — Telephone Encounter (Signed)
Open error 

## 2022-10-06 NOTE — Patient Instructions (Incomplete)

## 2022-10-06 NOTE — Progress Notes (Deleted)
No chief complaint on file.     HISTORY OF PRESENT ILLNESS:  10/06/22 ALL:  Gracen Ringwald is a 61 y.o. female here today for follow up for RRMS. She continues Vumerity but alternates twice daily with daily dosing due to low lymphocyte count. Lymph 0.6 in 05/2022.   She is doing fairly well. No new or exacerbating symptoms with the exception of mild achy knee pain for the past 2-3 days. She denies known injuries. She denies signs of infection. She feels gait is stable. She doesn't normally need an assistive device but will use a cane if tired. She did have a fall about 4 weeks ago. She tripped over some edging on the ground and fell on her left knee. She denies injury and did not have any significant knee pain following fall. Low back bothers her but she feels it is about the same from last visit. Tramadol helps some days. She has had some right knee pain for the past 2-3 days and reports that Tramadol has not helped.   Gabapentin '600mg'$  BID and Sinemet at bedtime help RLS symptoms. She feels that she sleeps fine but sometimes has interrupted sleep due to having to care for her boyfriend. He is a double amputee and needs her assistance for mobility.   Mood is stable on Paxil '20mg'$  daily. She is followed closely by PCP and oncology.    HISTORY (copied from Dr Garth Bigness previous note)  Damisha Wolff is a 61 y.o. woman with relapsing remitting multiple sclerosis.   Update 04/01/2022 She fell and broke her left  wrist.   She was at the gas station using her cane when she lost her balance and fell.   She had a cast.   It has healed now.  It is still a little sore.      She is on Vumerity and tolerates it well.   No definite exacerbation or new neurologic symptoms.    Last lymphocyte count was 0.8 09/2021   She is walking about the same and has reduced balance.  She can walk  20-25 minutes but then needs a rest due to LBP   She also has LBP if she stands up 20 minutes.    As she walks longer, her  back hurts more.    Her right leg is weaker than her left and more clumsy.   Arms are more symmetric.   She has dysesthesias in her legs/feet.   Also back pain.   Bladder function is better,   Vision is fine.      Lumbar MRI 2020 showed DJD but no spinal stenosis.     Headaches have done better the last few months.    She takes a tramadol when one occurs.  She has frequent headaches arising form the left occiput.    Mood is doing better.   She notes some fatigue.  She sleeps ok most nights but some sleep maintenance insomnia.      She was diagnosed with breast cancer January 2020.   Shedid TCHP (docetaxel, carboplatin, trastuzumab, pertuzumab (Perjeta)).   She has had 5 cycles of chemotherapy and her last infusion will be June 2.      She is on anastrozole now.    She sees  Dr. Lindi Adie twice a year.     MS history:  She was diagnosed with MS in 1999 based on MRI and lumbar rupture.  At that time she was living in [place] and was seeing Dr.  Zweibel.  When he retired she saw another doctor and then she moved to this area around 2015 but did not seek neurologic referral until 2015.  She was on Avonex for many years but stopped due to insurance.  Then, in November 2016, she started in the drug study and was on ALKS 8700 4 2 years.  The study ended in late 2018 for her and she switched over to [place].       Images: MRI brain showed multiple T2/FLAIR hyperintense foci in the hemispheres in a pattern and configuration consistent with chronic demyelinating plaque associated with multiple sclerosis.  None of the foci appear to be acute.  They do not enhance.  Compared to the MRI dated 08/09/2017, there are no new lesions.   MRI cervical spine showed 3 T2 hyperintense foci within the spinal cord adjacent to C2, C4-C5 and C6-C7.  All of these were present on the 2015 MRI.  They do not enhance.  They are consistent with chronic demyelinating plaque associated with multiple sclerosis.   Moderate spinal stenosis at  C5-C6 and mild spinal stenosis at C4-C5 and C6-C7.  There does not appear to be any nerve root compression at these levels.  Degenerative changes at C5-C6 have mildly progressed compared to the 2015 MRI.   MRI brain 02/19/2020  T2/FLAIR hyperintense foci in the hemispheres in a pattern and configuration consistent with chronic demyelinating plaque associated with multiple sclerosis.  None of the foci appear to be acute.  They do not enhance.  Compared to the MRI dated 08/09/2017, there are no new lesions   MRI lumbar 09/24/2019 showed  mild DJD but no spinal stenosis.  REVIEW OF SYSTEMS: Out of a complete 14 system review of symptoms, the patient complains only of the following symptoms, knee pain, low back pain and all other reviewed systems are negative.   ALLERGIES: Allergies  Allergen Reactions   Procaine Shortness Of Breath     HOME MEDICATIONS: Outpatient Medications Prior to Visit  Medication Sig Dispense Refill   albuterol (VENTOLIN HFA) 108 (90 Base) MCG/ACT inhaler INHALE 1-2 PUFFS BY MOUTH EVERY 6 HOURS AS NEEDED FOR WHEEZE OR SHORTNESS OF BREATH 18 each 1   anastrozole (ARIMIDEX) 1 MG tablet Take 1 tablet (1 mg total) by mouth daily. 90 tablet 3   aspirin-acetaminophen-caffeine (EXCEDRIN MIGRAINE) 250-250-65 MG tablet Take 2 tablets by mouth every 6 (six) hours as needed for migraine.     carbidopa-levodopa (SINEMET IR) 10-100 MG tablet TAKE 1 TABLET BY MOUTH EVERYDAY AT BEDTIME 90 tablet 3   celecoxib (CELEBREX) 200 MG capsule Take by mouth.     cyclobenzaprine (FLEXERIL) 5 MG tablet Take 1 tablet (5 mg total) by mouth 2 (two) times daily as needed for muscle spasms. 60 tablet 5   fluticasone (FLONASE) 50 MCG/ACT nasal spray PLACE 2 SPRAYS INTO BOTH NOSTRILS AT BEDTIME. 48 mL 2   gabapentin (NEURONTIN) 300 MG capsule TAKE 2 CAPSULES (600 MG TOTAL) BY MOUTH 2 (TWO) TIMES DAILY. 360 capsule 3   lisinopril (ZESTRIL) 10 MG tablet Take 1 tablet (10 mg total) by mouth daily. 30  tablet 11   Multiple Vitamin (MULTIVITAMIN WITH MINERALS) TABS tablet Take 2 tablets by mouth daily.     omeprazole (PRILOSEC OTC) 20 MG tablet Take 20 mg by mouth daily as needed (acid reflux).     PARoxetine (PAXIL) 20 MG tablet TAKE 1 TABLET BY MOUTH EVERY DAY 90 tablet 1   predniSONE (DELTASONE) 10 MG tablet SMARTSIG:5 Tablet(s)  By Mouth Every Morning     traMADol (ULTRAM) 50 MG tablet TAKE 1 TABLET BY MOUTH TWICE A DAY AS NEEDED 60 tablet 5   valACYclovir (VALTREX) 1000 MG tablet Take 1 tablet (1,000 mg total) by mouth 2 (two) times daily. 14 tablet 0   VUMERITY 231 MG CPDR TAKE 1 CAPSULE ('231MG'$ ) BY MOUTH TWICE DAILY FOR THE FIRST 7 DAYS, THEN TAKE 2 CAPSULES ('462MG'$ ) TWICE DAILY THEREAFTER 106 capsule 0   No facility-administered medications prior to visit.     PAST MEDICAL HISTORY: Past Medical History:  Diagnosis Date   Arthritis    Asthma    Cancer ([place])    chemo for breast ca  x25mo   Depression    Deviated nasal septum    Gait disturbance 01/12/2018   Hearing loss 06/11/2014   Bilateral   Herniated cervical disc    Hypertension    Leukocytosis 01/19/2019   Migraine    Miscarriage    2   MS (multiple sclerosis) ([place]    Shingles 12/24/2019   Sore throat 10/29/2021   Urinary tract infection without hematuria 08/10/2018     PAST SURGICAL HISTORY: Past Surgical History:  Procedure Laterality Date   BREAST BIOPSY Right 11/21/2018   Malignant   HEMATOMA EVACUATION Right 05/01/2019   Procedure: EVACUATION HEMATOMA;  Surgeon: WRolm Bookbinder MD;  Location: [facility]  Service: General;  Laterality: Right;   MASTECTOMY Right 04/30/2019   MASTECTOMY W/ SENTINEL NODE BIOPSY Right 04/30/2019   Procedure: RIGHT MASTECTOMY WITH  RIGHT AXILLARY SENTINEL LYMPH NODE BIOPSY INJECT BLUE DYE;  Surgeon: WRolm Bookbinder MD;  Location[facility]  Service: General;  Laterality: Right;   NASAL SEPTUM SURGERY     SIMPLE MASTECTOMY WITH AXILLARY SENTINEL NODE BIOPSY Right 04/30/2019   spinal  injections       FAMILY HISTORY: Family History  Adopted: Yes  Problem Relation Age of Onset   Cancer Maternal Uncle        throat     SOCIAL HISTORY: Social History   Socioeconomic History   Marital status: Single    Spouse name: Not on file   Number of children: 2   Years of education: 12   Highest education level: Not on file  Occupational History   Occupation: Disabled  Tobacco Use   Smoking status: Every Day    Packs/day: 1.00    Years: 44.00    Total pack years: 44.00    Types: Cigarettes   Smokeless tobacco: Never   Tobacco comments:    1 pk per day  Vaping Use   Vaping Use: Former  Substance and Sexual Activity   Alcohol use: Yes    Comment: 0-5 beers daily   Drug use: Yes    Types: Marijuana   Sexual activity: Not Currently    Partners: Male  Other Topics Concern   Not on file  Social History Narrative   Patient is single with 2 children.   Patient is right handed.   Current Social History 08/10/2018        Right handed       Patient lives with significant other ([name] in one level Townhome 08/10/2018    Transportation: Patient has own vehicle  08/10/2018   Important Relationships JHubert Azure10/17/2019    Pets: one dog named Browser 08/10/2018   Education / Work:  12th grade/ Disabled 08/10/2018   Interests / Fun: Watching baseball and Nascar 08/10/2018   Current Stressors: Significant other is depressed  over his poor health 08/10/2018   Religious / Personal Beliefs: "I believe in God" 08/10/2018   Other: "I had a miscarriage before I had my 2 sons." 08/10/2018   L. Silvano Rusk, RN, BSN                                                                                                 Social Determinants of Health   Financial Resource Strain: Not on file  Food Insecurity: Unknown (06/23/2022)   Hunger Vital Sign    Worried About Running Out of Food in the Last Year: Never true    [place] in the Last Year: Not on file   Transportation Needs: No Transportation Needs (06/23/2022)   PRAPARE - Hydrologist (Medical): No    Lack of Transportation (Non-Medical): No  Physical Activity: Not on file  Stress: Not on file  Social Connections: Not on file  Intimate Partner Violence: Not on file     PHYSICAL EXAM  There were no vitals filed for this visit.  There is no height or weight on file to calculate BMI.  Generalized: Well developed, in no acute distress  Cardiology: normal rate and rhythm, no murmur auscultated  Respiratory: clear to auscultation bilaterally    Neurological examination  Mentation: Alert oriented to time, place, history taking. Follows all commands speech and language fluent Cranial nerve II-XII: Pupils were equal round reactive to light. Extraocular movements were full, visual field were full on confrontational test. Facial sensation and strength were normal. Head turning and shoulder shrug  were normal and symmetric. Motor: The motor testing reveals 5 over 5 strength of all 4 extremities with exception of 4-4+/5 right hip flexion. Good symmetric motor tone is noted throughout.  Sensory: Sensory testing is intact to soft touch on all 4 extremities. No evidence of extinction is noted.  Coordination: Cerebellar testing reveals good finger-nose-finger and reduced right heel-to-shin bilaterally.  Gait and station: Gait is mildly wide.  Reflexes: Deep tendon reflexes are brisk in lower but symmetric bilaterally.  MSK: no obvious edema or deformity noted of right knee. Tenderness with palpation of medial aspect noted. No obvious effusion. No signs of infection.    DIAGNOSTIC DATA (LABS, IMAGING, TESTING) - I reviewed patient records, labs, notes, testing and imaging myself where available.  Lab Results  Component Value Date   WBC 3.8 (L) 06/16/2022   HGB 13.8 06/16/2022   HCT 40.9 06/16/2022   MCV 95.6 06/16/2022   PLT 238 06/16/2022      Component  Value Date/Time   NA 142 04/01/2022 1459   K 3.5 04/01/2022 1459   CL 103 04/01/2022 1459   CO2 26 04/01/2022 1459   GLUCOSE 97 04/01/2022 1459   GLUCOSE 93 12/12/2019 1330   BUN 8 04/01/2022 1459   CREATININE 0.76 04/01/2022 1459   CREATININE 0.68 12/12/2019 1330   CALCIUM 8.4 (L) 04/01/2022 1459   PROT 6.3 04/01/2022 1459   ALBUMIN 4.2 04/01/2022 1459   AST 20 04/01/2022 1459   AST 35 12/12/2019 1330   ALT 20 04/01/2022  1459   ALT 44 12/12/2019 1330   ALKPHOS 96 04/01/2022 1459   BILITOT 0.3 04/01/2022 1459   BILITOT 0.4 12/12/2019 1330   GFRNONAA 97 06/02/2020 1422   GFRNONAA >60 12/12/2019 1330   GFRAA 112 06/02/2020 1422   GFRAA >60 12/12/2019 1330   Lab Results  Component Value Date   CHOL 127 06/02/2020   HDL 55 06/02/2020   LDLCALC 56 06/02/2020   TRIG 82 06/02/2020   CHOLHDL 2.3 06/02/2020   Lab Results  Component Value Date   HGBA1C 5.3 12/07/2017   Lab Results  Component Value Date   [id number] 654 07/02/2015   No results found for: "TSH"      No data to display               No data to display           ASSESSMENT AND PLAN  61 y.o. year old female  has a past medical history of Arthritis, Asthma, Cancer ([place]), Depression, Deviated nasal septum, Gait disturbance (01/12/2018), Hearing loss (06/11/2014), Herniated cervical disc, Hypertension, Leukocytosis (01/19/2019), Migraine, Miscarriage, MS (multiple sclerosis) ([place]), Shingles (12/24/2019), Sore throat (10/29/2021), and Urinary tract infection without hematuria (08/10/2018). here with    No diagnosis found.  Melisha is doing fairly well. We will continue dimethyl fumerate, alternating daily and twice daily dosing. I will update labs, today. She will continue Tramadol as needed for generalized pain. She declines refill today stating she has about 20 tablets left. Last refill 08/21/2021. PDMP shows refills approx every 6 weeks. She will continue gabapentin and carb/levo for RLS. I will give her  meloxicam 7.5 and advised 1-2 tablets daily as needed. If knee pain persists she will call PCP for formal eval. She will continue to monitor back [pain. If worsens we can do lumbar MRI per Dr Garth Bigness recommendation. Healthy lifestyle habits encouraged. Fall precautions advised. She will follow up with Dr Felecia Shelling in 6 months.    No orders of the defined types were placed in this encounter.     No orders of the defined types were placed in this encounter.    Debbora Presto, MSN, FNP-C 10/06/2022, 11:04 AM  [facility] [address], [place], [place] [address] [phone]

## 2022-10-07 ENCOUNTER — Ambulatory Visit: Payer: Medicare HMO | Admitting: Family Medicine

## 2022-10-11 ENCOUNTER — Ambulatory Visit: Payer: Medicare HMO | Admitting: Family Medicine

## 2022-10-11 ENCOUNTER — Encounter: Payer: Self-pay | Admitting: Family Medicine

## 2022-10-11 DIAGNOSIS — G35 Multiple sclerosis: Secondary | ICD-10-CM

## 2022-10-19 ENCOUNTER — Encounter: Payer: Self-pay | Admitting: Neurology

## 2022-10-19 ENCOUNTER — Ambulatory Visit: Payer: Medicare HMO | Admitting: Neurology

## 2022-10-19 VITALS — BP 137/82 | HR 106 | Ht 62.0 in | Wt 125.8 lb

## 2022-10-19 DIAGNOSIS — M549 Dorsalgia, unspecified: Secondary | ICD-10-CM

## 2022-10-19 DIAGNOSIS — E559 Vitamin D deficiency, unspecified: Secondary | ICD-10-CM

## 2022-10-19 DIAGNOSIS — G8929 Other chronic pain: Secondary | ICD-10-CM

## 2022-10-19 DIAGNOSIS — C50411 Malignant neoplasm of upper-outer quadrant of right female breast: Secondary | ICD-10-CM

## 2022-10-19 DIAGNOSIS — R269 Unspecified abnormalities of gait and mobility: Secondary | ICD-10-CM

## 2022-10-19 DIAGNOSIS — G2581 Restless legs syndrome: Secondary | ICD-10-CM | POA: Diagnosis not present

## 2022-10-19 DIAGNOSIS — G35 Multiple sclerosis: Secondary | ICD-10-CM | POA: Diagnosis not present

## 2022-10-19 DIAGNOSIS — Z170 Estrogen receptor positive status [ER+]: Secondary | ICD-10-CM

## 2022-10-19 DIAGNOSIS — Z79899 Other long term (current) drug therapy: Secondary | ICD-10-CM

## 2022-10-19 NOTE — Progress Notes (Signed)
[facility]  PATIENT: Debbie Watts DOB: Mar 30, 1961    HISTORICAL  CHIEF COMPLAINT:  Chief Complaint  Patient presents with   Follow-up    Pt in room #11 and alone.  Pt here today for f/u on her MS.    HISTORY OF PRESENT ILLNESS:  Debbie Watts is a 61 y.o. woman with relapsing remitting multiple sclerosis.  Update 10/19/2022 She is on Vumerity and tolerates it well.   No definite exacerbation or new neurologic symptoms.    She is alternating between 231/231 and 462/462 every other day.   Last lymphocyte count was 0.6 and we reduced her dose.    She fell and broke her left  wrist.   She was at the gas station using her cane when she lost her balance and fell.   She had a cast.   It has healed now.  It is still a little sore.     She is walking about the same and has reduced balance.  She can walk a mile in 20-25 minutes but then needs a rest due to LBP and faigue   She also has LBP if she stands up 20 minutes.    As she walks longer, her back hurts more.    Her right leg is weaker than her left and more clumsy.   Arms are more symmetric.   She has dysesthesias in her legs/feet.   Also back pain.   Bladder function is better,   Vision is fine.      Lumbar MRI 2020 showed DJD but no spinal stenosis.    Headaches have done better the last few months.    She takes a tramadol when one occurs.  She has frequent headaches arising form the left occiput.   Mood is doing better.   She notes some fatigue.  She sleeps ok most nights but some sleep maintenance insomnia.   She is on Sinemet at bedtme only for RLS.     She was diagnosed with breast cancer January 2020.   Shedid TCHP (docetaxel, carboplatin, trastuzumab, pertuzumab (Perjeta)).   She has had 5 cycles of chemotherapy and her last infusion will be June 2.      She is on anastrozole now.    She sees  Dr. Lindi Adie twice a year.      MS history:  She was diagnosed with MS in 1999 based on MRI and lumbar rupture.  At  that time she was living in [place] and was seeing Dr. Tilden Fossa.  When he retired she saw another doctor and then she moved to this area around 2015 but did not seek neurologic referral until 2015.  She was on Avonex for many years but stopped due to insurance.  Then, in November 2016, she started in the drug study and was on ALKS 8700 4 2 years.  The study ended in late 2018 for her and she switched over to [place].      Images: MRI brain showed multiple T2/FLAIR hyperintense foci in the hemispheres in a pattern and configuration consistent with chronic demyelinating plaque associated with multiple sclerosis.  None of the foci appear to be acute.  They do not enhance.  Compared to the MRI dated 08/09/2017, there are no new lesions.  MRI cervical spine showed 3 T2 hyperintense foci within the spinal cord adjacent to C2, C4-C5 and C6-C7.  All of these were present on the 2015 MRI.  They do not enhance.  They are consistent with  chronic demyelinating plaque associated with multiple sclerosis.   Moderate spinal stenosis at C5-C6 and mild spinal stenosis at C4-C5 and C6-C7.  There does not appear to be any nerve root compression at these levels.  Degenerative changes at C5-C6 have mildly progressed compared to the 2015 MRI.  MRI brain 02/19/2020  T2/FLAIR hyperintense foci in the hemispheres in a pattern and configuration consistent with chronic demyelinating plaque associated with multiple sclerosis.  None of the foci appear to be acute.  They do not enhance.  Compared to the MRI dated 08/09/2017, there are no new lesions  MRI lumbar 09/24/2019 showed  mild DJD but no spinal stenosis.  No   REVIEW OF SYSTEMS: Constitutional: No fevers, chills, sweats, or change in appetite.  She has fatigue.  She sometimes has insomnia. Eyes: No visual changes, double vision, eye pain Ear, nose and throat: No hearing loss, ear pain, nasal congestion, sore throat Cardiovascular: No chest pain, palpitations Respiratory:  No  shortness of breath at rest or with exertion.   No wheezes GastrointestinaI: No nausea, vomiting, diarrhea, abdominal pain, fecal incontinence Genitourinary: She has urinary frequency and nocturia . Musculoskeletal:  No neck pain, back pain Integumentary: No rash, pruritus, skin lesions Neurological: as above Psychiatric: No depression at this time.  No anxiety Endocrine: No palpitations, diaphoresis, change in appetite, change in weigh or increased thirst Hematologic/Lymphatic:  No anemia, purpura, petechiae. Allergic/Immunologic: No itchy/runny eyes, nasal congestion, recent allergic reactions, rashes  ALLERGIES: Allergies  Allergen Reactions   Procaine Shortness Of Breath    HOME MEDICATIONS:  Current Outpatient Medications:    albuterol (VENTOLIN HFA) 108 (90 Base) MCG/ACT inhaler, INHALE 1-2 PUFFS BY MOUTH EVERY 6 HOURS AS NEEDED FOR WHEEZE OR SHORTNESS OF BREATH, Disp: 18 each, Rfl: 1   anastrozole (ARIMIDEX) 1 MG tablet, Take 1 tablet (1 mg total) by mouth daily., Disp: 90 tablet, Rfl: 3   aspirin-acetaminophen-caffeine (EXCEDRIN MIGRAINE) 250-250-65 MG tablet, Take 2 tablets by mouth every 6 (six) hours as needed for migraine., Disp: , Rfl:    carbidopa-levodopa (SINEMET IR) 10-100 MG tablet, TAKE 1 TABLET BY MOUTH EVERYDAY AT BEDTIME, Disp: 90 tablet, Rfl: 3   cyclobenzaprine (FLEXERIL) 5 MG tablet, Take 1 tablet (5 mg total) by mouth 2 (two) times daily as needed for muscle spasms., Disp: 60 tablet, Rfl: 5   fluticasone (FLONASE) 50 MCG/ACT nasal spray, PLACE 2 SPRAYS INTO BOTH NOSTRILS AT BEDTIME., Disp: 48 mL, Rfl: 2   gabapentin (NEURONTIN) 300 MG capsule, TAKE 2 CAPSULES (600 MG TOTAL) BY MOUTH 2 (TWO) TIMES DAILY., Disp: 360 capsule, Rfl: 3   lisinopril (ZESTRIL) 10 MG tablet, Take 1 tablet (10 mg total) by mouth daily., Disp: 30 tablet, Rfl: 11   Multiple Vitamin (MULTIVITAMIN WITH MINERALS) TABS tablet, Take 2 tablets by mouth daily., Disp: , Rfl:    omeprazole (PRILOSEC  OTC) 20 MG tablet, Take 20 mg by mouth daily as needed (acid reflux)., Disp: , Rfl:    PARoxetine (PAXIL) 20 MG tablet, TAKE 1 TABLET BY MOUTH EVERY DAY, Disp: 90 tablet, Rfl: 1   traMADol (ULTRAM) 50 MG tablet, TAKE 1 TABLET BY MOUTH TWICE A DAY AS NEEDED, Disp: 60 tablet, Rfl: 5   VUMERITY 231 MG CPDR, TAKE 1 CAPSULE ('231MG'$ ) BY MOUTH TWICE DAILY FOR THE FIRST 7 DAYS, THEN TAKE 2 CAPSULES ('462MG'$ ) TWICE DAILY THEREAFTER, Disp: 106 capsule, Rfl: 0   valACYclovir (VALTREX) 1000 MG tablet, Take 1 tablet (1,000 mg total) by mouth 2 (two) times daily. (Patient  not taking: Reported on 10/19/2022), Disp: 14 tablet, Rfl: 0  PAST MEDICAL HISTORY: Past Medical History:  Diagnosis Date   Arthritis    Asthma    Cancer ([place])    chemo for breast ca  x67mo   Depression    Deviated nasal septum    Gait disturbance 01/12/2018   Hearing loss 06/11/2014   Bilateral   Herniated cervical disc    Hypertension    Leukocytosis 01/19/2019   Migraine    Miscarriage    2   MS (multiple sclerosis) ([place]    Shingles 12/24/2019   Sore throat 10/29/2021   Urinary tract infection without hematuria 08/10/2018    PAST SURGICAL HISTORY: Past Surgical History:  Procedure Laterality Date   BREAST BIOPSY Right 11/21/2018   Malignant   HEMATOMA EVACUATION Right 05/01/2019   Procedure: EVACUATION HEMATOMA;  Surgeon: WRolm Bookbinder MD;  [facility]  Service: General;  Laterality: Right;   MASTECTOMY Right 04/30/2019   MASTECTOMY W/ SENTINEL NODE BIOPSY Right 04/30/2019   Procedure: RIGHT MASTECTOMY WITH  RIGHT AXILLARY SENTINEL LYMPH NODE BIOPSY INJECT BLUE DYE;  Surgeon: WRolm Bookbinder MD;  Location: [facility]  Service: General;  Laterality: Right;   NASAL SEPTUM SURGERY     SIMPLE MASTECTOMY WITH AXILLARY SENTINEL NODE BIOPSY Right 04/30/2019   spinal injections      FAMILY HISTORY: Family History  Adopted: Yes  Problem Relation Age of Onset   Cancer Maternal Uncle        throat    SOCIAL  HISTORY:  Social History   Socioeconomic History   Marital status: Single    Spouse name: Not on file   Number of children: 2   Years of education: 12   Highest education level: Not on file  Occupational History   Occupation: Disabled  Tobacco Use   Smoking status: Every Day    Packs/day: 1.00    Years: 44.00    Total pack years: 44.00    Types: Cigarettes   Smokeless tobacco: Never   Tobacco comments:    1 pk per day  Vaping Use   Vaping Use: Former  Substance and Sexual Activity   Alcohol use: Yes    Comment: 0-5 beers daily   Drug use: Yes    Types: Marijuana   Sexual activity: Not Currently    Partners: Male  Other Topics Concern   Not on file  Social History Narrative   Patient is single with 2 children.   Patient is right handed.   Current Social History 08/10/2018        Right handed       Patient lives with significant other ([name] in one level Townhome 08/10/2018    Transportation: Patient has own vehicle  08/10/2018   Important Relationships JHubert Azure10/17/2019    Pets: one dog named Browser 08/10/2018   Education / Work:  12th grade/ Disabled 08/10/2018   Interests / Fun: Watching baseball and Nascar 08/10/2018   Current Stressors: Significant other is depressed over his poor health 08/10/2018   Religious / Personal Beliefs: "I believe in God" 08/10/2018   Other: "I had a miscarriage before I had my 2 sons." 08/10/2018   L. DSilvano Rusk RN, BSN  Social Determinants of Health   Financial Resource Strain: Not on file  Food Insecurity: Unknown (06/23/2022)   Hunger Vital Sign    Worried About Running Out of Food in the Last Year: Never true    Ran Out of Food in the Last Year: Not on file  Transportation Needs: No Transportation Needs (06/23/2022)   PRAPARE - Hydrologist (Medical): No    Lack of Transportation  (Non-Medical): No  Physical Activity: Not on file  Stress: Not on file  Social Connections: Not on file  Intimate Partner Violence: Not on file     PHYSICAL EXAM  Vitals:   10/19/22 1104  BP: 137/82  Pulse: (!) 106  Weight: 125 lb 12.8 oz (57.1 kg)  Height: '5\' 2"'$  (1.575 m)    Body mass index is 23.01 kg/m.   General: The patient is well-developed and well-nourished and in no acute distress.   She has tenderness in the left occiput radiating up her skull with deep palpation.  Neurologic Exam  Mental status: The patient is alert and oriented x 3 at the time of the examination. The patient has apparent normal recent and remote memory, with an apparently normal attention span and concentration ability.   Speech is normal.  Cranial nerves: Extraocular movements are full.  Facial strength and sensation was normal.  The trapezius strength was normal bilaterally.  The tongue is midline, and the patient has symmetric elevation of the soft palate. Bilateral hearing deficits are noted.  Motor:  Muscle bulk is normal.   Tone is normal. Strength is  5 / 5 in all 4 extremities.   Sensory: She had slight asymmetry of sensation, reduced on the left to touch and vibration  Coordination: Cerebellar testing shows good finger-nose-finger.  Heel-to-shin is mildly reduced bilaterally.  Gait and station: Station is normal.  Her gait is mildly wide.  The tandem gait is wide.  Romberg is negative.  Reflexes: Deep tendon reflexes are symmetric and normal in arms, but increased in legs but no clonus at ankles.         DIAGNOSTIC DATA (LABS, IMAGING, TESTING) - I reviewed patient records, labs, notes, testing and imaging myself where available.  Lab Results  Component Value Date   WBC 3.8 (L) 06/16/2022   HGB 13.8 06/16/2022   HCT 40.9 06/16/2022   MCV 95.6 06/16/2022   PLT 238 06/16/2022      Component Value Date/Time   NA 142 04/01/2022 1459   K 3.5 04/01/2022 1459   CL 103 04/01/2022  1459   CO2 26 04/01/2022 1459   GLUCOSE 97 04/01/2022 1459   GLUCOSE 93 12/12/2019 1330   BUN 8 04/01/2022 1459   CREATININE 0.76 04/01/2022 1459   CREATININE 0.68 12/12/2019 1330   CALCIUM 8.4 (L) 04/01/2022 1459   PROT 6.3 04/01/2022 1459   ALBUMIN 4.2 04/01/2022 1459   AST 20 04/01/2022 1459   AST 35 12/12/2019 1330   ALT 20 04/01/2022 1459   ALT 44 12/12/2019 1330   ALKPHOS 96 04/01/2022 1459   BILITOT 0.3 04/01/2022 1459   BILITOT 0.4 12/12/2019 1330   GFRNONAA 97 06/02/2020 1422   GFRNONAA >60 12/12/2019 1330   GFRAA 112 06/02/2020 1422   GFRAA >60 12/12/2019 1330   Lab Results  Component Value Date   CHOL 127 06/02/2020   HDL 55 06/02/2020   LDLCALC 56 06/02/2020   TRIG 82 06/02/2020   CHOLHDL 2.3 06/02/2020   Lab Results  Component Value Date   HGBA1C 5.3 12/07/2017   ________________________________________________  Multiple sclerosis (HCC)  High risk medication use  Gait disturbance  RLS (restless legs syndrome)  Chronic midline back pain, unspecified back location  Malignant neoplasm of upper-outer quadrant of right breast in female, estrogen receptor positive ([place])   1.   Continue Vumerity.   Check CBC/Diff and CMP.  If kasai count is low we will drop the dose to 231 mg twice a day.  If lymphocyte counts remain low we would need to consider changing to a different medication (such as Aubagio) we will need to take her history of breast cancer into account if we make a change in therapy. 2.   Continue Flexeril.  Tylenol as needed pain 3.   Stay active and exercise as tolerated. 4.   She has some facet hypertrophy likely explaining back pain.  If back pain worsens, consider repeat Lumbar spine MRI (if worsens do c/s contrast due to breast cancer history) 5.    rtc in 6 months or sooner if new or worsening issues. Nevea Spiewak A. Felecia Shelling, MD, [facility] 37/34/2876, 81:15 AM Certified in Neurology, Clinical Neurophysiology, Sleep Medicine, Pain Medicine and  Neuroimaging  [facility] [address], [place], [place] [address] [phone]

## 2022-10-20 ENCOUNTER — Telehealth: Payer: Self-pay | Admitting: *Deleted

## 2022-10-20 DIAGNOSIS — Z79899 Other long term (current) drug therapy: Secondary | ICD-10-CM

## 2022-10-20 DIAGNOSIS — G35 Multiple sclerosis: Secondary | ICD-10-CM

## 2022-10-20 LAB — CBC WITH DIFFERENTIAL/PLATELET
Basophils Absolute: 0.1 10*3/uL (ref 0.0–0.2)
Basos: 1 %
EOS (ABSOLUTE): 0.1 10*3/uL (ref 0.0–0.4)
Eos: 2 %
Hematocrit: 41 % (ref 34.0–46.6)
Hemoglobin: 13.9 g/dL (ref 11.1–15.9)
Immature Grans (Abs): 0 10*3/uL (ref 0.0–0.1)
Immature Granulocytes: 0 %
Lymphocytes Absolute: 0.5 10*3/uL — ABNORMAL LOW (ref 0.7–3.1)
Lymphs: 12 %
MCH: 31.2 pg (ref 26.6–33.0)
MCHC: 33.9 g/dL (ref 31.5–35.7)
MCV: 92 fL (ref 79–97)
Monocytes Absolute: 0.4 10*3/uL (ref 0.1–0.9)
Monocytes: 9 %
Neutrophils Absolute: 3.2 10*3/uL (ref 1.4–7.0)
Neutrophils: 76 %
Platelets: 253 10*3/uL (ref 150–450)
RBC: 4.46 x10E6/uL (ref 3.77–5.28)
RDW: 12.8 % (ref 11.7–15.4)
WBC: 4.3 10*3/uL (ref 3.4–10.8)

## 2022-10-20 LAB — COMPREHENSIVE METABOLIC PANEL
ALT: 18 IU/L (ref 0–32)
AST: 18 IU/L (ref 0–40)
Albumin/Globulin Ratio: 2.4 — ABNORMAL HIGH (ref 1.2–2.2)
Albumin: 4.5 g/dL (ref 3.8–4.9)
Alkaline Phosphatase: 93 IU/L (ref 44–121)
BUN/Creatinine Ratio: 20 (ref 12–28)
BUN: 14 mg/dL (ref 8–27)
Bilirubin Total: 0.3 mg/dL (ref 0.0–1.2)
CO2: 26 mmol/L (ref 20–29)
Calcium: 8.8 mg/dL (ref 8.7–10.3)
Chloride: 101 mmol/L (ref 96–106)
Creatinine, Ser: 0.71 mg/dL (ref 0.57–1.00)
Globulin, Total: 1.9 g/dL (ref 1.5–4.5)
Glucose: 103 mg/dL — ABNORMAL HIGH (ref 70–99)
Potassium: 4 mmol/L (ref 3.5–5.2)
Sodium: 140 mmol/L (ref 134–144)
Total Protein: 6.4 g/dL (ref 6.0–8.5)
eGFR: 97 mL/min/{1.73_m2} (ref >59)

## 2022-10-20 LAB — VITAMIN D 25 HYDROXY (VIT D DEFICIENCY, FRACTURES): Vit D, 25-Hydroxy: 5.8 ng/mL — ABNORMAL LOW (ref 30.0–100.0)

## 2022-10-20 NOTE — Telephone Encounter (Signed)
-----   Message from Britt Bottom, MD sent at 10/20/2022 11:40 AM EST ----- THe lymphocyte count is still low --- she should hold Vumerity for two weeks and then restart at just one pill twice daily.  I would like her to have another lymphocyte count in 3-4 months.

## 2022-10-20 NOTE — Telephone Encounter (Signed)
Called pt. Relayed results per Dr. Garth Bigness note. Pt verbalized understanding. Scheduled repeat lab appt for 01/17/23 at 1:30pm.  Placed future lab order.

## 2022-10-23 ENCOUNTER — Other Ambulatory Visit: Payer: Self-pay | Admitting: Internal Medicine

## 2022-10-23 DIAGNOSIS — I10 Essential (primary) hypertension: Secondary | ICD-10-CM

## 2022-10-26 ENCOUNTER — Other Ambulatory Visit: Payer: Self-pay | Admitting: Internal Medicine

## 2022-10-26 ENCOUNTER — Other Ambulatory Visit: Payer: Self-pay | Admitting: Neurology

## 2022-10-26 DIAGNOSIS — G35 Multiple sclerosis: Secondary | ICD-10-CM

## 2022-10-26 DIAGNOSIS — F339 Major depressive disorder, recurrent, unspecified: Secondary | ICD-10-CM

## 2022-10-26 DIAGNOSIS — G8929 Other chronic pain: Secondary | ICD-10-CM

## 2023-01-02 ENCOUNTER — Other Ambulatory Visit: Payer: Self-pay | Admitting: Internal Medicine

## 2023-01-02 DIAGNOSIS — I10 Essential (primary) hypertension: Secondary | ICD-10-CM

## 2023-01-17 ENCOUNTER — Other Ambulatory Visit: Payer: Medicare HMO

## 2023-01-18 ENCOUNTER — Other Ambulatory Visit (INDEPENDENT_AMBULATORY_CARE_PROVIDER_SITE_OTHER): Payer: Self-pay

## 2023-01-18 DIAGNOSIS — Z79899 Other long term (current) drug therapy: Secondary | ICD-10-CM

## 2023-01-18 DIAGNOSIS — G35 Multiple sclerosis: Secondary | ICD-10-CM

## 2023-01-18 DIAGNOSIS — Z0289 Encounter for other administrative examinations: Secondary | ICD-10-CM

## 2023-01-19 LAB — CBC WITH DIFFERENTIAL/PLATELET
Basophils Absolute: 0.1 10*3/uL (ref 0.0–0.2)
Basos: 2 %
EOS (ABSOLUTE): 0.1 10*3/uL (ref 0.0–0.4)
Eos: 2 %
Hematocrit: 40.9 % (ref 34.0–46.6)
Hemoglobin: 13.7 g/dL (ref 11.1–15.9)
Immature Grans (Abs): 0 10*3/uL (ref 0.0–0.1)
Immature Granulocytes: 0 %
Lymphocytes Absolute: 0.7 10*3/uL (ref 0.7–3.1)
Lymphs: 14 %
MCH: 30.6 pg (ref 26.6–33.0)
MCHC: 33.5 g/dL (ref 31.5–35.7)
MCV: 92 fL (ref 79–97)
Monocytes Absolute: 0.6 10*3/uL (ref 0.1–0.9)
Monocytes: 13 %
Neutrophils Absolute: 3.3 10*3/uL (ref 1.4–7.0)
Neutrophils: 69 %
Platelets: 261 10*3/uL (ref 150–450)
RBC: 4.47 x10E6/uL (ref 3.77–5.28)
RDW: 12.6 % (ref 11.7–15.4)
WBC: 4.7 10*3/uL (ref 3.4–10.8)

## 2023-01-26 ENCOUNTER — Ambulatory Visit (INDEPENDENT_AMBULATORY_CARE_PROVIDER_SITE_OTHER): Payer: Medicare HMO | Admitting: Internal Medicine

## 2023-01-26 ENCOUNTER — Other Ambulatory Visit: Payer: Self-pay

## 2023-01-26 ENCOUNTER — Ambulatory Visit (INDEPENDENT_AMBULATORY_CARE_PROVIDER_SITE_OTHER): Payer: Medicare HMO

## 2023-01-26 ENCOUNTER — Ambulatory Visit: Payer: Medicare HMO

## 2023-01-26 ENCOUNTER — Encounter: Payer: Self-pay | Admitting: Internal Medicine

## 2023-01-26 VITALS — BP 123/76 | HR 67 | Temp 97.7°F | Ht 62.0 in | Wt 130.3 lb

## 2023-01-26 VITALS — BP 123/76 | HR 67 | Temp 97.9°F | Ht 62.0 in | Wt 130.3 lb

## 2023-01-26 DIAGNOSIS — G35 Multiple sclerosis: Secondary | ICD-10-CM

## 2023-01-26 DIAGNOSIS — F17210 Nicotine dependence, cigarettes, uncomplicated: Secondary | ICD-10-CM

## 2023-01-26 DIAGNOSIS — J449 Chronic obstructive pulmonary disease, unspecified: Secondary | ICD-10-CM

## 2023-01-26 DIAGNOSIS — Z0000 Encounter for general adult medical examination without abnormal findings: Secondary | ICD-10-CM

## 2023-01-26 DIAGNOSIS — I10 Essential (primary) hypertension: Secondary | ICD-10-CM

## 2023-01-26 DIAGNOSIS — F17200 Nicotine dependence, unspecified, uncomplicated: Secondary | ICD-10-CM

## 2023-01-26 NOTE — Progress Notes (Signed)
CC: routine office visit  HPI:  Debbie Watts is a 62 y.o. female living with a history stated below and presents today for follow-up of her chronic medical conditions. Please see problem based assessment and plan for additional details.  Past Medical History:  Diagnosis Date   Acute cystitis without hematuria 03/16/2018   Arthritis    Asthma    Cancer    chemo for breast ca  x72mos   Depression    Deviated nasal septum    Gait disturbance 01/12/2018   Hearing loss 06/11/2014   Bilateral   Herniated cervical disc    Hypertension    Leukocytosis 01/19/2019   Migraine    Miscarriage    2   MS (multiple sclerosis)    Restless leg syndrome 06/23/2014   Shingles 12/24/2019   Sleep disturbance 07/10/2020   Sore throat 10/29/2021   Syncope 01/19/2019   Urinary tract infection without hematuria 08/10/2018   UTI (urinary tract infection), uncomplicated [id number]   Wrist pain, acute, left 12/23/2021    Current Outpatient Medications on File Prior to Visit  Medication Sig Dispense Refill   albuterol (VENTOLIN HFA) 108 (90 Base) MCG/ACT inhaler INHALE 1-2 PUFFS BY MOUTH EVERY 6 HOURS AS NEEDED FOR WHEEZE OR SHORTNESS OF BREATH 18 each 1   anastrozole (ARIMIDEX) 1 MG tablet Take 1 tablet (1 mg total) by mouth daily. 90 tablet 3   aspirin-acetaminophen-caffeine (EXCEDRIN MIGRAINE) 250-250-65 MG tablet Take 2 tablets by mouth every 6 (six) hours as needed for migraine.     carbidopa-levodopa (SINEMET IR) 10-100 MG tablet TAKE 1 TABLET BY MOUTH EVERYDAY AT BEDTIME 90 tablet 3   cyclobenzaprine (FLEXERIL) 5 MG tablet TAKE 1 TABLET BY MOUTH 2 TIMES DAILY AS NEEDED FOR MUSCLE SPASMS. 60 tablet 5   fluticasone (FLONASE) 50 MCG/ACT nasal spray PLACE 2 SPRAYS INTO BOTH NOSTRILS AT BEDTIME. 48 mL 2   gabapentin (NEURONTIN) 300 MG capsule TAKE 2 CAPSULES BY MOUTH 2 TIMES DAILY. 360 capsule 3   lisinopril (ZESTRIL) 10 MG tablet Take 1 tablet (10 mg total) by mouth daily. 30 tablet 11    Multiple Vitamin (MULTIVITAMIN WITH MINERALS) TABS tablet Take 2 tablets by mouth daily.     omeprazole (PRILOSEC OTC) 20 MG tablet Take 20 mg by mouth daily as needed (acid reflux).     PARoxetine (PAXIL) 20 MG tablet TAKE 1 TABLET BY MOUTH EVERY DAY 90 tablet 1   traMADol (ULTRAM) 50 MG tablet TAKE 1 TABLET BY MOUTH TWICE A DAY AS NEEDED 60 tablet 5   valACYclovir (VALTREX) 1000 MG tablet Take 1 tablet (1,000 mg total) by mouth 2 (two) times daily. (Patient not taking: Reported on 10/19/2022) 14 tablet 0   VUMERITY 231 MG CPDR TAKE 1 CAPSULE (231MG ) BY MOUTH TWICE DAILY FOR THE FIRST 7 DAYS, THEN TAKE 2 CAPSULES (462MG ) TWICE DAILY THEREAFTER 106 capsule 0   [DISCONTINUED] prochlorperazine (COMPAZINE) 10 MG tablet TAKE 1 TABLET (10 MG TOTAL) BY MOUTH EVERY 6 (SIX) HOURS AS NEEDED (NAUSEA OR VOMITING). 30 tablet 1   No current facility-administered medications on file prior to visit.    Family History  Adopted: Yes  Problem Relation Age of Onset   Cancer Maternal Uncle        throat    Social History   Socioeconomic History   Marital status: Single    Spouse name: Not on file   Number of children: 2   Years of education: 12   Highest education level:  Not on file  Occupational History   Occupation: Disabled  Tobacco Use   Smoking status: Every Day    Packs/day: 1.00    Years: 44.00    Additional pack years: 0.00    Total pack years: 44.00    Types: Cigarettes   Smokeless tobacco: Never   Tobacco comments:    1 pk per day  Vaping Use   Vaping Use: Former  Substance and Sexual Activity   Alcohol use: Yes    Comment: 0-5 beers daily   Drug use: Yes    Types: Marijuana   Sexual activity: Not Currently    Partners: Male  Other Topics Concern   Not on file  Social History Narrative   Patient is single with 2 children.   Patient is right handed.   Current Social History 08/10/2018        Right handed       Patient lives with significant other [name]) in one  level [place] 08/10/2018    Transportation: Patient has own vehicle  08/10/2018   Important Relationships [name] 08/10/2018    Pets: one dog named Browser 08/10/2018   Education / Work:  12th grade/ Disabled 08/10/2018   Interests / Fun: Watching baseball and Nascar 08/10/2018   Current Stressors: Significant other is depressed over his poor health 08/10/2018   Religious / Personal Beliefs: "I believe in God" 08/10/2018   Other: "I had a miscarriage before I had my 2 sons." 08/10/2018   L. Silvano Rusk, RN, BSN                                                                                                 Social Determinants of Health   Financial Resource Strain: Low Risk  (01/26/2023)   Overall Financial Resource Strain (CARDIA)    Difficulty of Paying Living Expenses: Not very hard  Food Insecurity: No Food Insecurity (01/26/2023)   Hunger Vital Sign    Worried About Running Out of Food in the Last Year: Never true    Ran Out of Food in the Last Year: Never true  Transportation Needs: No Transportation Needs (01/26/2023)   PRAPARE - Hydrologist (Medical): No    Lack of Transportation (Non-Medical): No  Physical Activity: Inactive (01/26/2023)   Exercise Vital Sign    Days of Exercise per Week: 0 days    Minutes of Exercise per Session: 0 min  Stress: Stress Concern Present (01/26/2023)   [place]    Feeling of Stress : To some extent  Social Connections: Moderately Isolated (01/26/2023)   Social Connection and Isolation Panel [NHANES]    Frequency of Communication with Friends and Family: Once a week    Frequency of Social Gatherings with Friends and Family: Never    Attends Religious Services: More than 4 times per year    Active Member of [employer] or Organizations: No    Attends Archivist Meetings: Never    Marital Status: Living with partner  Intimate Partner Violence: Not At  Risk (01/26/2023)  Humiliation, Afraid, Rape, and Kick questionnaire    Fear of Current or Ex-Partner: No    Emotionally Abused: No    Physically Abused: No    Sexually Abused: No    Review of Systems: ROS negative except for what is noted on the assessment and plan.  Vitals:   01/26/23 1114  BP: 123/76  Pulse: 67  Temp: 97.7 F (36.5 C)  TempSrc: Oral  Weight: 130 lb 4.8 oz (59.1 kg)  Height: 5\' 2"  (1.575 m)    Physical Exam: Constitutional: well-appearing female sitting in chair, in no acute distress Cardiovascular: regular rate and rhythm, no m/r/g Pulmonary/Chest: normal work of breathing on room air, lungs clear to auscultation bilaterally MSK: normal bulk and tone, bilateral wrists in brace Neurological: alert & oriented x 3, no focal deficit Skin: warm and dry Psych: normal mood and behavior  Assessment & Plan:    Patient discussed with Dr. Saverio Danker  Essential hypertension Blood pressure is well-controlled at 123/76.  The patient takes lisinopril 10 mg daily and has been doing well with this current dose.  She denies any headaches, vision changes, lightheadedness, dizziness, chest pain, palpitations, or shortness of breath.  CMP was last checked in December and was unremarkable.  Plan: - Continue lisinopril 10 mg daily   Healthcare maintenance - Fecal occult test to be performed - Low-dose CT for lung cancer screening (risk of developing lung cancer in 5 years is 4%)  Multiple sclerosis ([place]) The patient has a history of MS and is followed by neurology.  In addition to pain from her MS, she also has arthritis that causes her significant pain.  She takes tramadol 50 mg twice a day, but sometimes 3 times a day.  She also takes Tylenol and Flexeril as needed for pain.  She states that despite taking all of these medications, she still is in a significant amount of pain, specifically in her knees and upper extremities.  Plan: -Referral to physical therapy  Chronic  obstructive pulmonary disease (HCC) Stable symptoms.  The COPD is well-controlled with using albuterol 3-4 days out of the week.   Buddy Duty, D.O. [facility], PGY-2 Phone: [phone] Date 01/26/2023 Time 2:17 PM

## 2023-01-26 NOTE — Assessment & Plan Note (Signed)
-   Fecal occult test to be performed - Low-dose CT for lung cancer screening (risk of developing lung cancer in 5 years is 4%)

## 2023-01-26 NOTE — Assessment & Plan Note (Signed)
Stable symptoms.  The COPD is well-controlled with using albuterol 3-4 days out of the week.

## 2023-01-26 NOTE — Assessment & Plan Note (Addendum)
The patient has a history of MS and is followed by neurology.  In addition to pain from her MS, she also has arthritis that causes her significant pain.  She takes tramadol 50 mg twice a day, but sometimes 3 times a day.  She also takes Tylenol and Flexeril as needed for pain.  She states that despite taking all of these medications, she still is in a significant amount of pain, specifically in her knees and upper extremities.  Plan: -Referral to physical therapy

## 2023-01-26 NOTE — Assessment & Plan Note (Addendum)
Blood pressure is well-controlled at 123/76.  The patient takes lisinopril 10 mg daily and has been doing well with this current dose.  She denies any headaches, vision changes, lightheadedness, dizziness, chest pain, palpitations, or shortness of breath.  CMP was last checked in December and was unremarkable.  Plan: - Continue lisinopril 10 mg daily

## 2023-01-26 NOTE — Patient Instructions (Signed)
Thank you, Ms.Peter Garter for allowing [place] to provide your care today. Today we discussed:  High blood pressure: Blood pressure is excellent, so keep taking lisinopril every day We will do the annual stool test to check for colon cancer I referred you to physical therapy for your MS/arthritis pain  I have ordered the following labs for you:   Lab Orders         Fecal occult blood, imunochemical       Referrals ordered today:    Referral Orders         Ambulatory referral to Physical Therapy      I have ordered the following medication/changed the following medications:   Stop the following medications: There are no discontinued medications.   Start the following medications: No orders of the defined types were placed in this encounter.    Follow up: 6 months     Should you have any questions or concerns please call the internal medicine clinic at [phone].     Buddy Duty, D.O. [place]

## 2023-01-26 NOTE — Progress Notes (Signed)
Subjective:   Debbie Watts is a 62 y.o. female who presents for Medicare Annual (Subsequent) preventive examination. I connected with  Debbie Watts on 01/26/23 by a  Face-To-Face encounter  and verified that I am speaking with the correct person using two identifiers.  Patient Location: Other:  Office/Clinic  Provider Location: Office/Clinic  I discussed the limitations of evaluation and management by telemedicine. The patient expressed understanding and agreed to proceed.  Review of Systems    Defer to PCP       Objective:    Today's Vitals   01/26/23 1313  BP: 123/76  Pulse: 67  Temp: 97.9 F (36.6 C)  TempSrc: Oral  SpO2: 100%  Weight: 130 lb 4.8 oz (59.1 kg)  Height: 5\' 2"  (1.575 m)  PainSc: 10-Worst pain ever   Body mass index is 23.83 kg/m.     01/26/2023    1:14 PM 07/12/2022   11:19 AM 06/23/2022    9:34 AM 12/23/2021    1:57 PM 11/10/2021    3:03 PM 10/29/2021    1:33 PM 08/05/2021    1:14 PM  Advanced Directives  Does Patient Have a Medical Advance Directive? No No No No No No No  Would patient like information on creating a medical advance directive? No - Patient declined No - Patient declined No - Patient declined No - Patient declined No - Patient declined No - Patient declined No - Patient declined    Current Medications (verified) Outpatient Encounter Medications as of 01/26/2023  Medication Sig   albuterol (VENTOLIN HFA) 108 (90 Base) MCG/ACT inhaler INHALE 1-2 PUFFS BY MOUTH EVERY 6 HOURS AS NEEDED FOR WHEEZE OR SHORTNESS OF BREATH   anastrozole (ARIMIDEX) 1 MG tablet Take 1 tablet (1 mg total) by mouth daily.   aspirin-acetaminophen-caffeine (EXCEDRIN MIGRAINE) 250-250-65 MG tablet Take 2 tablets by mouth every 6 (six) hours as needed for migraine.   carbidopa-levodopa (SINEMET IR) 10-100 MG tablet TAKE 1 TABLET BY MOUTH EVERYDAY AT BEDTIME   cyclobenzaprine (FLEXERIL) 5 MG tablet TAKE 1 TABLET BY MOUTH 2 TIMES DAILY AS NEEDED FOR MUSCLE SPASMS.    fluticasone (FLONASE) 50 MCG/ACT nasal spray PLACE 2 SPRAYS INTO BOTH NOSTRILS AT BEDTIME.   gabapentin (NEURONTIN) 300 MG capsule TAKE 2 CAPSULES BY MOUTH 2 TIMES DAILY.   lisinopril (ZESTRIL) 10 MG tablet Take 1 tablet (10 mg total) by mouth daily.   Multiple Vitamin (MULTIVITAMIN WITH MINERALS) TABS tablet Take 2 tablets by mouth daily.   omeprazole (PRILOSEC OTC) 20 MG tablet Take 20 mg by mouth daily as needed (acid reflux).   PARoxetine (PAXIL) 20 MG tablet TAKE 1 TABLET BY MOUTH EVERY DAY   traMADol (ULTRAM) 50 MG tablet TAKE 1 TABLET BY MOUTH TWICE A DAY AS NEEDED   valACYclovir (VALTREX) 1000 MG tablet Take 1 tablet (1,000 mg total) by mouth 2 (two) times daily. (Patient not taking: Reported on 10/19/2022)   VUMERITY 231 MG CPDR TAKE 1 CAPSULE (231MG ) BY MOUTH TWICE DAILY FOR THE FIRST 7 DAYS, THEN TAKE 2 CAPSULES (462MG ) TWICE DAILY THEREAFTER   [DISCONTINUED] prochlorperazine (COMPAZINE) 10 MG tablet TAKE 1 TABLET (10 MG TOTAL) BY MOUTH EVERY 6 (SIX) HOURS AS NEEDED (NAUSEA OR VOMITING).   No facility-administered encounter medications on file as of 01/26/2023.    Allergies (verified) Procaine   History: Past Medical History:  Diagnosis Date   Acute cystitis without hematuria 03/16/2018   Arthritis    Asthma    Cancer  chemo for breast ca  x3mos   Depression    Deviated nasal septum    Gait disturbance 01/12/2018   Hearing loss 06/11/2014   Bilateral   Herniated cervical disc    Hypertension    Leukocytosis 01/19/2019   Migraine    Miscarriage    2   MS (multiple sclerosis)    Restless leg syndrome 06/23/2014   Shingles 12/24/2019   Sleep disturbance 07/10/2020   Sore throat 10/29/2021   Syncope 01/19/2019   Urinary tract infection without hematuria 08/10/2018   UTI (urinary tract infection), uncomplicated 123456   Wrist pain, acute, left 12/23/2021   Past Surgical History:  Procedure Laterality Date   BREAST BIOPSY Right 11/21/2018   Malignant    HEMATOMA EVACUATION Right 05/01/2019   Procedure: EVACUATION HEMATOMA;  Surgeon: Rolm Bookbinder, MD;  Location: [facility];  Service: General;  Laterality: Right;   MASTECTOMY Right 04/30/2019   MASTECTOMY W/ SENTINEL NODE BIOPSY Right 04/30/2019   Procedure: RIGHT MASTECTOMY WITH  RIGHT AXILLARY SENTINEL LYMPH NODE BIOPSY INJECT BLUE DYE;  Surgeon: Rolm Bookbinder, MD;  Location: [facility];  Service: General;  Laterality: Right;   NASAL SEPTUM SURGERY     SIMPLE MASTECTOMY WITH AXILLARY SENTINEL NODE BIOPSY Right 04/30/2019   spinal injections     Family History  Adopted: Yes  Problem Relation Age of Onset   Cancer Maternal Uncle        throat   Social History   Socioeconomic History   Marital status: Single    Spouse name: Not on file   Number of children: 2   Years of education: 12   Highest education level: Not on file  Occupational History   Occupation: Disabled  Tobacco Use   Smoking status: Every Day    Packs/day: 1.00    Years: 44.00    Additional pack years: 0.00    Total pack years: 44.00    Types: Cigarettes   Smokeless tobacco: Never   Tobacco comments:    1 pk per day  Vaping Use   Vaping Use: Former  Substance and Sexual Activity   Alcohol use: Yes    Comment: 0-5 beers daily   Drug use: Yes    Types: Marijuana   Sexual activity: Not Currently    Partners: Male  Other Topics Concern   Not on file  Social History Narrative   Patient is single with 2 children.   Patient is right handed.   Current Social History 08/10/2018        Right handed       Patient lives with significant other [name]) in one level Townhome 08/10/2018    Transportation: Patient has own vehicle  08/10/2018   Important Relationships [name] 08/10/2018    Pets: one dog named Browser 08/10/2018   Education / Work:  12th grade/ Disabled 08/10/2018   Interests / Fun: Watching baseball and Nascar 08/10/2018   Current Stressors: Significant other is depressed over his poor  health 08/10/2018   Religious / Personal Beliefs: "I believe in God" 08/10/2018   Other: "I had a miscarriage before I had my 2 sons." 08/10/2018   L. Silvano Rusk, RN, BSN  Social Determinants of Health   Financial Resource Strain: Low Risk  (01/26/2023)   Overall Financial Resource Strain (CARDIA)    Difficulty of Paying Living Expenses: Not very hard  Food Insecurity: No Food Insecurity (01/26/2023)   Hunger Vital Sign    Worried About Running Out of Food in the Last Year: Never true    Ran Out of Food in the Last Year: Never true  Transportation Needs: No Transportation Needs (01/26/2023)   PRAPARE - Hydrologist (Medical): No    Lack of Transportation (Non-Medical): No  Physical Activity: Inactive (01/26/2023)   Exercise Vital Sign    Days of Exercise per Week: 0 days    Minutes of Exercise per Session: 0 min  Stress: Stress Concern Present (01/26/2023)   [place]    Feeling of Stress : To some extent  Social Connections: Moderately Isolated (01/26/2023)   Social Connection and Isolation Panel [NHANES]    Frequency of Communication with Friends and Family: Once a week    Frequency of Social Gatherings with Friends and Family: Never    Attends Religious Services: More than 4 times per year    Active Member of [employer] or Organizations: No    Attends Music therapist: Never    Marital Status: Living with partner    Tobacco Counseling Ready to quit: Not Answered Counseling given: Not Answered Tobacco comments: 1 pk per day   Clinical Intake:  Pre-visit preparation completed: Yes  Pain : 0-10 Pain Score: 10-Worst pain ever Pain Type: Chronic pain Pain Location: Hand Pain Orientation: Right, Left Pain Descriptors / Indicators: Aching, Sharp, Shooting, Numbness, Pins and needles,  Tightness, Throbbing, Pressure Pain Onset: More than a month ago Pain Frequency: Constant Pain Relieving Factors: otc medicaions  Pain Relieving Factors: otc medicaions  Nutritional Risks: None Diabetes: No  How often do you need to have someone help you when you read instructions, pamphlets, or other written materials from your doctor or pharmacy?: 1 - Never What is the last grade level you completed in school?: 13 years  Diabetic?No  Interpreter Needed?: No  Information entered by :: Maurissa Ambrose,cma   Activities of Daily Living    01/26/2023    1:15 PM 01/26/2023   11:23 AM  In your present state of health, do you have any difficulty performing the following activities:  Hearing? 1 1  Vision? 0 0  Difficulty concentrating or making decisions? 0 0  Walking or climbing stairs? 1 1  Comment  AT TIMES  Dressing or bathing? 0 0  Doing errands, shopping? 0 0    Patient Care Team: Dorethea Clan, DO as PCP - General Excell Seltzer, MD (Inactive) as Consulting Physician (General Surgery) Nicholas Lose, MD as Consulting Physician (Hematology and Oncology) Kyung Rudd, MD as Consulting Physician (Radiation Oncology)  Indicate any recent Medical Services you may have received from other than [facility] providers in the past year (date may be approximate).     Assessment:   This is a routine wellness examination for [place].  Hearing/Vision screen No results found.  Dietary issues and exercise activities discussed:     Goals Addressed   None   Depression Screen    01/26/2023    1:15 PM 01/26/2023   11:22 AM 06/23/2022    9:35 AM 06/16/2022    2:00 PM 12/23/2021    1:59 PM 10/29/2021    3:00 PM 06/09/2020  4:34 PM  PHQ 2/9 Scores  PHQ - 2 Score 0 0 0 1 1 0 0  PHQ- 9 Score 2 2  3 6       Fall Risk    01/26/2023    1:14 PM 01/26/2023   11:21 AM 06/16/2022    2:00 PM 12/23/2021    1:58 PM 11/10/2021    3:05 PM  Fall Risk   Falls in the past year? 1 1 1 1  0  Number falls in  past yr: 1 1 0 1 0  Injury with Fall? 1 1 1 1  0  Risk for fall due to : Impaired balance/gait Impaired balance/gait History of fall(s)    Follow up Falls evaluation completed;Falls prevention discussed Falls evaluation completed Falls evaluation completed Falls evaluation completed;Education provided Falls evaluation completed    FALL RISK PREVENTION PERTAINING TO THE HOME:  Any stairs in or around the home? No  If so, are there any without handrails? No  Home free of loose throw rugs in walkways, pet beds, electrical cords, etc? Yes  Adequate lighting in your home to reduce risk of falls? Yes   ASSISTIVE DEVICES UTILIZED TO PREVENT FALLS:  Life alert? No  Use of a cane, walker or w/c? No  Grab bars in the bathroom? No  Shower chair or bench in shower? No  Elevated toilet seat or a handicapped toilet? No   TIMED UP AND GO:  Was the test performed? No .  Length of time to ambulate 10 feet: 0 sec.   Gait slow and steady without use of assistive device  Cognitive Function:        01/26/2023    1:15 PM  6CIT Screen  What Year? 0 points  What month? 0 points  What time? 0 points  Count back from 20 0 points  Months in reverse 0 points  Repeat phrase 0 points  Total Score 0 points    Immunizations Immunization History  Administered Date(s) Administered   Influenza, Seasonal, Injecte, Preservative Fre 07/23/2014, 07/25/2015   Influenza,inj,Quad PF,6+ Mos 07/26/2016, 08/31/2017, 07/20/2018, 09/06/2019, 07/10/2020, 08/05/2021   PFIZER(Purple Top)SARS-COV-2 Vaccination 11/26/2019, 12/24/2019, 10/11/2020   PNEUMOCOCCAL CONJUGATE-20 11/10/2021   Pneumococcal Polysaccharide-23 07/23/2014   Tdap 04/20/2018   Zoster Recombinat (Shingrix) 06/24/2020    TDAP status: Up to date  Flu Vaccine status: Up to date  Pneumococcal vaccine status: Due, Education has been provided regarding the importance of this vaccine. Advised may receive this vaccine at local pharmacy or Health  Dept. Aware to provide a copy of the vaccination record if obtained from local pharmacy or Health Dept. Verbalized acceptance and understanding.  Covid-19 vaccine status: Completed vaccines  Qualifies for Shingles Vaccine? No   Zostavax completed No   Shingrix Completed?: Yes  Screening Tests Health Maintenance  Topic Date Due   Lung Cancer Screening  Never done   COVID-19 Vaccine (4 - 2023-24 season) 06/25/2022   COLON CANCER SCREENING ANNUAL FOBT  08/26/2022   INFLUENZA VACCINE  05/26/2023   Medicare Annual Wellness (AWV)  01/26/2024   MAMMOGRAM  07/08/2024   PAP SMEAR-Modifier  08/05/2024   DTaP/Tdap/Td (2 - Td or Tdap) 04/20/2028   Hepatitis C Screening  Completed   HIV Screening  Completed   Zoster Vaccines- Shingrix  Completed   HPV VACCINES  Aged Out   COLONOSCOPY (Pts 45-7yrs Insurance coverage will need to be confirmed)  Discontinued    Health Maintenance  Health Maintenance Due  Topic Date Due   Lung Cancer  Screening  Never done   COVID-19 Vaccine (4 - 2023-24 season) 06/25/2022   COLON CANCER SCREENING ANNUAL FOBT  08/26/2022    Colorectal cancer screening: Type of screening: FOBT/FIT. Completed N/A ORDERED 01/26/2023. Repeat every 1 years  Mammogram status: Completed 07/08/2022. Repeat every year:2    Lung Cancer Screening: (Low Dose CT Chest recommended if Age 24-80 years, 30 pack-year currently smoking OR have quit w/in 15years.) does not qualify.   Lung Cancer Screening Referral: N/A  Additional Screening:  Hepatitis C Screening: does not qualify; Completed 04/20/2018  Vision Screening: Recommended annual ophthalmology exams for early detection of glaucoma and other disorders of the eye. Is the patient up to date with their annual eye exam?  No  Who is the provider or what is the name of the office in which the patient attends annual eye exams? N/A If pt is not established with a provider, would they like to be referred to a provider to establish  care? Yes .   Dental Screening: Recommended annual dental exams for proper oral hygiene  Community Resource Referral / Chronic Care Management: CRR required this visit?  No   CCM required this visit?  Yes      Plan:     I have personally reviewed and noted the following in the patient's chart:   Medical and social history Use of alcohol, tobacco or illicit drugs  Current medications and supplements including opioid prescriptions. Patient is currently taking opioid prescriptions. Information provided to patient regarding non-opioid alternatives. Patient advised to discuss non-opioid treatment plan with their provider. Functional ability and status Nutritional status Physical activity Advanced directives List of other physicians Hospitalizations, surgeries, and ER visits in previous 12 months Vitals Screenings to include cognitive, depression, and falls Referrals and appointments  In addition, I have reviewed and discussed with patient certain preventive protocols, quality metrics, and best practice recommendations. A written personalized care plan for preventive services as well as general preventive health recommendations were provided to patient.     Kerin Perna, [facility]   01/26/2023   Nurse Notes: Face-To-Face Visit  Ms. Poblete , Thank you for taking time to come for your Medicare Wellness Visit. I appreciate your ongoing commitment to your health goals. Please review the following plan we discussed and let me know if I can assist you in the future.   These are the goals we discussed:  Goals       Quit Smoking      Will call [phone] when ready to quit smoking      Weight (lb) < 135 lb (61.2 kg) (pt-stated)      5-7% weight loss        This is a list of the screening recommended for you and due dates:  Health Maintenance  Topic Date Due   Screening for Lung Cancer  Never done   COVID-19 Vaccine (4 - 2023-24 season) 06/25/2022   Stool Blood Test  08/26/2022    Flu Shot  05/26/2023   Medicare Annual Wellness Visit  01/26/2024   Mammogram  07/08/2024   Pap Smear  08/05/2024   DTaP/Tdap/Td vaccine (2 - Td or Tdap) 04/20/2028   Hepatitis C Screening: USPSTF Recommendation to screen - Ages 71-79 yo.  Completed   HIV Screening  Completed   Zoster (Shingles) Vaccine  Completed   HPV Vaccine  Aged Out   Colon Cancer Screening  Discontinued

## 2023-01-28 NOTE — Progress Notes (Signed)
Internal Medicine Clinic Attending ° °Case discussed with Dr. Atway  At the time of the visit.  We reviewed the resident’s history and exam and pertinent patient test results.  I agree with the assessment, diagnosis, and plan of care documented in the resident’s note.  °

## 2023-01-28 NOTE — Progress Notes (Signed)
Internal Medicine Clinic Attending  Case and documentation of Dr. Atway  soon after the resident saw the patient reviewed.  I reviewed the AWV findings.  I agree with the assessment, diagnosis, and plan of care documented in the AWV note.     

## 2023-02-01 ENCOUNTER — Ambulatory Visit: Payer: Medicare HMO | Admitting: Physical Therapy

## 2023-02-02 ENCOUNTER — Other Ambulatory Visit: Payer: Self-pay | Admitting: Neurology

## 2023-02-04 ENCOUNTER — Ambulatory Visit: Payer: Medicare HMO | Attending: Internal Medicine

## 2023-02-04 DIAGNOSIS — R293 Abnormal posture: Secondary | ICD-10-CM | POA: Insufficient documentation

## 2023-02-04 DIAGNOSIS — G35 Multiple sclerosis: Secondary | ICD-10-CM | POA: Diagnosis not present

## 2023-02-04 DIAGNOSIS — M6281 Muscle weakness (generalized): Secondary | ICD-10-CM | POA: Diagnosis present

## 2023-02-04 DIAGNOSIS — R262 Difficulty in walking, not elsewhere classified: Secondary | ICD-10-CM | POA: Diagnosis present

## 2023-02-04 NOTE — Therapy (Signed)
OUTPATIENT PHYSICAL THERAPY NEURO EVALUATION   Patient Name: Debbie Watts MRN: [id number] DOB:January 20, 1961, 62 y.o., female Today's Date: 02/04/2023   PCP: Elza Rafter, DO REFERRING PROVIDER: Dickie La, MD   END OF SESSION:  PT End of Session - 02/04/23 1221     Visit Number 1    Number of Visits 5    Date for PT Re-Evaluation 03/04/23    Authorization Type [employer] medic[employer]    PT Start Time 1230    PT Stop Time 1313    PT Time Calculation (min) 43 min    Activity Tolerance Patient tolerated treatment well    Behavior During Therapy [facility] for tasks assessed/performed             Past Medical History:  Diagnosis Date   Acute cystitis without hematuria 03/16/2018   Arthritis    Asthma    Cancer    chemo for breast ca  x56mos   Depression    Deviated nasal septum    Gait disturbance 01/12/2018   Hearing loss 06/11/2014   Bilateral   Herniated cervical disc    Hypertension    Leukocytosis 01/19/2019   Migraine    Miscarriage    2   MS (multiple sclerosis)    Restless leg syndrome 06/23/2014   Shingles 12/24/2019   Sleep disturbance 07/10/2020   Sore throat 10/29/2021   Syncope 01/19/2019   Urinary tract infection without hematuria 08/10/2018   UTI (urinary tract infection), uncomplicated 06/09/2020   Wrist pain, acute, left 12/23/2021   Past Surgical History:  Procedure Laterality Date   BREAST BIOPSY Right 11/21/2018   Malignant   HEMATOMA EVACUATION Right 05/01/2019   Procedure: EVACUATION HEMATOMA;  Surgeon: Emelia Loron, MD;  Location: [facility] OR;  Service: General;  Laterality: Right;   MASTECTOMY Right 04/30/2019   MASTECTOMY W/ SENTINEL NODE BIOPSY Right 04/30/2019   Procedure: RIGHT MASTECTOMY WITH  RIGHT AXILLARY SENTINEL LYMPH NODE BIOPSY INJECT BLUE DYE;  Surgeon: Emelia Loron, MD;  Location: [facility] OR;  Service: General;  Laterality: Right;   NASAL SEPTUM SURGERY     SIMPLE MASTECTOMY WITH AXILLARY SENTINEL NODE BIOPSY Right 04/30/2019    spinal injections     Patient Active Problem List   Diagnosis Date Noted   Depression 08/06/2021   Chronic migraine without aura without status migrainosus, not intractable 03/19/2021   Other headache syndrome 09/10/2020   High risk medication use 03/06/2020   Adhesive capsulitis of left shoulder 02/14/2020   Breast cancer, right 04/30/2019   Port-A-Cath in place 02/13/2019   Malignant neoplasm of upper-outer quadrant of right breast in female, estrogen receptor positive 11/27/2018   Chronic midline back pain 09/19/2018   Chronic left-sided thoracic back pain 07/20/2018   Solar lentigo 04/21/2018   Healthcare maintenance 04/20/2018   Easy bruising 08/31/2017   Chronic obstructive pulmonary disease 11/30/2016   Essential hypertension 01/26/2016   Migraine headache 06/23/2014   Multiple sclerosis 06/23/2014   Hearing loss, bilateral 06/11/2014   Major depression, recurrent, chronic 06/11/2014   Cervical herniated disc 06/11/2014   Cigarette nicotine dependence without complication 06/11/2014   Lumbar herniated disc 06/11/2014    ONSET DATE: 01/26/23 referral  REFERRING DIAG: G35 (ICD-10-CM) - Multiple sclerosis   THERAPY DIAG:  Abnormal posture  Difficulty in walking, not elsewhere classified  Muscle weakness (generalized)  Rationale for Evaluation and Treatment: Rehabilitation  SUBJECTIVE:  SUBJECTIVE STATEMENT: Patient arrives to clinic alone and without AD. Patient reports greatest pain in R knee, low back and L wrist. Did fx L wrist ~8 months. Patient reports "always having back pain." Does report slight worsening in her back and now can't stand for >10 mins at a time. R knee pain is also chronic. Patient reports feeling as though her MS is "getting worse" as she's experiencing more pain.  She currently takes tylenol and tramadol for pain, sometimes it works and sometimes it doesn't. Uses braces sometimes for R knee and B wrists with some relief. 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 Patient describing questionable ergonomics when assisting him in bed (states he is essentially bedbound).  Pt accompanied by: self  PERTINENT HISTORY: RR MS, tobacco use, migraines, breast CA  PAIN:  Are you having pain? Yes: NPRS scale: 4-6/10 Pain location: low back and R knee Pain description: achy  PRECAUTIONS: Fall  WEIGHT BEARING RESTRICTIONS: No  FALLS: Has patient fallen in last 6 months? No  LIVING ENVIRONMENT: Lives with: lives with their partner Lives in: House/apartment Stairs:  ramp Has following equipment at home: Single point cane, Environmental consultant - 2 wheeled, Wheelchair (manual), Tour manager, and Ramped entry  PLOF: Independent; driving  PATIENT GOALS: "I want to be able to walk more"  OBJECTIVE:   COGNITION: Overall cognitive status: Within functional limits for tasks assessed   SENSATION: Reports some n/t in ulnar distribution in R UE and medial R lower leg distal to knee  COORDINATION: Slightly more effortful R LE heel shin Figure 8 equal and WFL B LE   POSTURE: rounded shoulders, forward head, increased thoracic kyphosis, posterior pelvic tilt, and flexed trunk   LOWER EXTREMITY MMT:    MMT Right Eval Left Eval  Hip flexion 3+ 4/5  Hip extension    Hip abduction 4/5 4/5  Hip adduction 4/5 4/5  Hip internal rotation    Hip external rotation    Knee flexion 3/5 4/5  Knee extension 4/5 4/5  Ankle dorsiflexion 4/5 4/5  Ankle plantarflexion    Ankle inversion    Ankle eversion    (Blank rows = not tested)  BED MOBILITY:  Reports no difficulty   GAIT: Gait pattern: step through pattern, antalgic, and wide BOS Distance walked: clinic Assistive device utilized: None Level of assistance: Modified  independence   FUNCTIONAL TESTS:   [facility] PT Assessment - 02/04/23 0001       Standardized Balance Assessment   Five times sit to stand comments  24.09   no UE   10 Meter Walk .30m/s              PATIENT SURVEYS:  Modified Oswestry 21, moderate disability   LEFS 40% (32/80)  TODAY'S TREATMENT:  N/a eval    PATIENT EDUCATION: Education details: PT POC, exam findings, initiated educated on proper ergonomics as a caregiver Person educated: Patient Education method: Explanation Education comprehension: verbalized understanding and needs further education  HOME EXERCISE PROGRAM: To be provided  GOALS: Goals reviewed with patient? Yes  SHORT TERM GOALS: = LTG based on POC length   LONG TERM GOALS: Target date: 03/04/23  Pt will be independent with final HEP for improved functional strength and posture/ergonomics  Baseline: to be provided Goal status: INITIAL  2.  Pt will improve 5x STS to </= 20 sec to demo improved functional LE strength and balance   Baseline: 24.09s no UE Goal status: INITIAL  3.  Pt will improve gait speed to >/= .71m/s to demonstrate improved community ambulation  Baseline: .8m/s Goal status: INITIAL  4.  Patient will improve LEFS score to >/= 41/80 to demonstrate improved function with R LE Baseline: 32/80 Goal status: INITIAL  5.  Patient will improve Oswestry score to </= 15 to demonstrate improved function with reduction in symptoms  Baseline: 21 Goal status: INITIAL    ASSESSMENT:  CLINICAL IMPRESSION: Patient is a 62 y.o. female who was seen today for physical therapy evaluation and treatment for joint pain related to her dx of MS. 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 He is essential bed bound for his ADLs and requires assist from patient. Patient  reporting questionable ergonomics with her assist, likely contributing to her experience of pain. She scored a 32/80 on the LEFS indicating 40% function due to R LE pain. She scored a 21 on the Oswestry indicating moderate disability due to her low back pain. Five times Sit to Stand Test (FTSS) Method: Use a straight back chair with a solid seat that is 17-18" high. Ask participant to sit on the chair with arms folded across their chest.   Instructions: "Stand up and sit down as quickly as possible 5 times, keeping your arms folded across your chest."   Measurement: Stop timing when the participant touches the chair in sitting the 5th time.  TIME: 24.09 sec  Cut off scores indicative of increased fall risk: >12 sec CVA, >16 sec PD, >13 sec vestibular (ANPTA Core Set of Outcome Measures for Adults with Neurologic Conditions, 2018). 10 Meter Walk Test: Patient instructed to walk 10 meters (32.8 ft) as quickly and as safely as possible at their normal speed x2 and at a fast speed x2. Time measured from 2 meter mark to 8 meter mark to accommodate ramp-up and ramp-down.  Normal speed: .40m/s Cut off scores: <0.4 m/s = household Ambulator, 0.4-0.8 m/s = limited community Ambulator, >0.8 m/s = community Ambulator, >1.2 m/s = crossing a street, <1.0 = increased fall risk MCID 0.05 m/s (small), 0.13 m/s (moderate), 0.06 m/s (significant)  (ANPTA Core Set of Outcome Measures for Adults with Neurologic Conditions, 2018). Patient would benefit from skilled PT services to address the above mentioned deficits.      OBJECTIVE IMPAIRMENTS: Abnormal gait, decreased activity tolerance, decreased endurance, decreased knowledge of condition, decreased strength, improper body mechanics, postural dysfunction, and pain.   ACTIVITY LIMITATIONS: carrying, lifting, bending, standing, locomotion level, and caring for others  PARTICIPATION LIMITATIONS: meal prep, cleaning, interpersonal relationship, driving, shopping,  community activity, occupation, and yard work  PERSONAL FACTORS: Age, Fitness, Past/current experiences, Sex, Time since onset of injury/illness/exacerbation, and 3+ comorbidities: see above  are also affecting patient's functional outcome.   REHAB  POTENTIAL: Fair time since onset  CLINICAL DECISION MAKING: Stable/uncomplicated  EVALUATION COMPLEXITY: Low  PLAN:  PT FREQUENCY: 1x/week per patient request  PT DURATION: 4 weeks  PLANNED INTERVENTIONS: Therapeutic exercises, Therapeutic activity, Neuromuscular re-education, Balance training, Gait training, Patient/Family education, Self Care, Joint mobilization, Stair training, Vestibular training, Canalith repositioning, Orthotic/Fit training, DME instructions, Aquatic Therapy, Dry Needling, Taping, Manual therapy, and Re-evaluation  PLAN FOR NEXT SESSION: HEP, ergonomics retraining, postural exercises   Westley Foots, PT, DPT, CBIS 02/04/2023, 2:13 PM

## 2023-02-08 ENCOUNTER — Other Ambulatory Visit: Payer: Self-pay | Admitting: Internal Medicine

## 2023-02-11 ENCOUNTER — Ambulatory Visit: Payer: Medicare HMO

## 2023-02-11 DIAGNOSIS — M6281 Muscle weakness (generalized): Secondary | ICD-10-CM

## 2023-02-11 DIAGNOSIS — R293 Abnormal posture: Secondary | ICD-10-CM

## 2023-02-11 DIAGNOSIS — R262 Difficulty in walking, not elsewhere classified: Secondary | ICD-10-CM

## 2023-02-11 NOTE — Therapy (Signed)
OUTPATIENT PHYSICAL THERAPY NEURO TREATMENT   Patient Name: Debbie Watts MRN: [id number] DOB:17-Nov-1960, 62 y.o., female Today's Date: 02/11/2023   PCP: Elza Rafter, DO REFERRING PROVIDER: Dickie La, MD   END OF SESSION:  PT End of Session - 02/11/23 1232     Visit Number 2    Number of Visits 5    Date for PT Re-Evaluation 03/04/23    Authorization Type [employer] medic[employer]    PT Start Time 1230    PT Stop Time 1310    PT Time Calculation (min) 40 min    Activity Tolerance Patient tolerated treatment well    Behavior During Therapy [facility] for tasks assessed/performed             Past Medical History:  Diagnosis Date   Acute cystitis without hematuria 03/16/2018   Arthritis    Asthma    Cancer    chemo for breast ca  x8mos   Depression    Deviated nasal septum    Gait disturbance 01/12/2018   Hearing loss 06/11/2014   Bilateral   Herniated cervical disc    Hypertension    Leukocytosis 01/19/2019   Migraine    Miscarriage    2   MS (multiple sclerosis)    Restless leg syndrome 06/23/2014   Shingles 12/24/2019   Sleep disturbance 07/10/2020   Sore throat 10/29/2021   Syncope 01/19/2019   Urinary tract infection without hematuria 08/10/2018   UTI (urinary tract infection), uncomplicated 06/09/2020   Wrist pain, acute, left 12/23/2021   Past Surgical History:  Procedure Laterality Date   BREAST BIOPSY Right 11/21/2018   Malignant   HEMATOMA EVACUATION Right 05/01/2019   Procedure: EVACUATION HEMATOMA;  Surgeon: Emelia Loron, MD;  Location: [facility] OR;  Service: General;  Laterality: Right;   MASTECTOMY Right 04/30/2019   MASTECTOMY W/ SENTINEL NODE BIOPSY Right 04/30/2019   Procedure: RIGHT MASTECTOMY WITH  RIGHT AXILLARY SENTINEL LYMPH NODE BIOPSY INJECT BLUE DYE;  Surgeon: Emelia Loron, MD;  Location: [facility] OR;  Service: General;  Laterality: Right;   NASAL SEPTUM SURGERY     SIMPLE MASTECTOMY WITH AXILLARY SENTINEL NODE BIOPSY Right 04/30/2019    spinal injections     Patient Active Problem List   Diagnosis Date Noted   Depression 08/06/2021   Chronic migraine without aura without status migrainosus, not intractable 03/19/2021   Other headache syndrome 09/10/2020   High risk medication use 03/06/2020   Adhesive capsulitis of left shoulder 02/14/2020   Breast cancer, right 04/30/2019   Port-A-Cath in place 02/13/2019   Malignant neoplasm of upper-outer quadrant of right breast in female, estrogen receptor positive 11/27/2018   Chronic midline back pain 09/19/2018   Chronic left-sided thoracic back pain 07/20/2018   Solar lentigo 04/21/2018   Healthcare maintenance 04/20/2018   Easy bruising 08/31/2017   Chronic obstructive pulmonary disease 11/30/2016   Essential hypertension 01/26/2016   Migraine headache 06/23/2014   Multiple sclerosis 06/23/2014   Hearing loss, bilateral 06/11/2014   Major depression, recurrent, chronic 06/11/2014   Cervical herniated disc 06/11/2014   Cigarette nicotine dependence without complication 06/11/2014   Lumbar herniated disc 06/11/2014    ONSET DATE: 01/26/23 referral  REFERRING DIAG: G35 (ICD-10-CM) - Multiple sclerosis   THERAPY DIAG:  Abnormal posture  Difficulty in walking, not elsewhere classified  Muscle weakness (generalized)  Rationale for Evaluation and Treatment: Rehabilitation  SUBJECTIVE:  SUBJECTIVE STATEMENT: Patient reports doing fair. Does have some R ankle pain today, reporting that she feels as though her ankle is going to roll. Took pain rx this AM and thus has no pain right now. Denies falls/near falls.  Pt accompanied by: self  PERTINENT HISTORY: RR MS, tobacco use, migraines, breast CA  PAIN:  Are you having pain? No  PRECAUTIONS: Fall  PATIENT GOALS: "I want to be able  to walk more"  TODAY'S TREATMENT:                                                                                                                              THEREX: -scifit hills level 2 x10 mins B LE -established HEP (see below)  -seated ankle AROM on wobble board  PATIENT EDUCATION: Education details: initial HEP Person educated: Patient Education method: Explanation Education comprehension: verbalized understanding and needs further education  HOME EXERCISE PROGRAM: Access Code: [email or web address] URL: [email or web address] Date: 02/11/2023 Prepared by: Merry Lofty  Exercises - Supine Bridge  - 1 x daily - 7 x weekly - 3 sets - 10 reps - Supine Lower Trunk Rotation  - 1 x daily - 7 x weekly - 3 sets - 10 reps - Supine Single Knee to Chest Stretch  - 1 x daily - 7 x weekly - 3 sets - 30 hold - Supine March with Posterior Pelvic Tilt  - 1 x daily - 7 x weekly - 3 sets - 10 reps - Clamshell  - 1 x daily - 7 x weekly - 3 sets - 10 reps - Mini Squat with Counter Support  - 1 x daily - 7 x weekly - 3 sets - 10 reps - Standing Knee Flexion with Counter Support  - 1 x daily - 7 x weekly - 3 sets - 10 reps - Standing Hip Extension with Counter Support  - 1 x daily - 7 x weekly - 3 sets - 10 reps - Standing Hip Abduction with Counter Support  - 1 x daily - 7 x weekly - 3 sets - 10 reps - Heel Raises with Counter Support  - 1 x daily - 7 x weekly - 3 sets - 10 reps - Seated Ankle Alphabet  - 1 x daily - 7 x weekly - 1 sets - 1 reps  GOALS: Goals reviewed with patient? Yes  SHORT TERM GOALS: = LTG based on POC length   LONG TERM GOALS: Target date: 03/04/23  Pt will be independent with final HEP for improved functional strength and posture/ergonomics  Baseline: to be provided Goal status: INITIAL  2.  Pt will improve 5x STS to </= 20 sec to demo improved functional LE strength and balance   Baseline: 24.09s no UE Goal status: INITIAL  3.  Pt will improve gait speed to  >/= .61m/s to demonstrate improved community ambulation  Baseline: .63m/s Goal status: INITIAL  4.  Patient will improve LEFS score to >/= 41/80  to demonstrate improved function with R LE Baseline: 32/80 Goal status: INITIAL  5.  Patient will improve Oswestry score to </= 15 to demonstrate improved function with reduction in symptoms  Baseline: 21 Goal status: INITIAL    ASSESSMENT:  CLINICAL IMPRESSION: Patient seen for skilled PT session with emphasis on establishing HEP. PT providing patient with robust HEP address the joints that cause her pain the most (low back, R knee, R ankle). She tolerated exercises well and required minimal cues on form. Would benefit from progressing the challenge of these exercises in the clinic initially. Continue POC.      OBJECTIVE IMPAIRMENTS: Abnormal gait, decreased activity tolerance, decreased endurance, decreased knowledge of condition, decreased strength, improper body mechanics, postural dysfunction, and pain.   ACTIVITY LIMITATIONS: carrying, lifting, bending, standing, locomotion level, and caring for others  PARTICIPATION LIMITATIONS: meal prep, cleaning, interpersonal relationship, driving, shopping, community activity, occupation, and yard work  PERSONAL FACTORS: Age, Fitness, Past/current experiences, Sex, Time since onset of injury/illness/exacerbation, and 3+ comorbidities: see above  are also affecting patient's functional outcome.   REHAB POTENTIAL: Fair time since onset  CLINICAL DECISION MAKING: Stable/uncomplicated  EVALUATION COMPLEXITY: Low  PLAN:  PT FREQUENCY: 1x/week per patient request  PT DURATION: 4 weeks  PLANNED INTERVENTIONS: Therapeutic exercises, Therapeutic activity, Neuromuscular re-education, Balance training, Gait training, Patient/Family education, Self Care, Joint mobilization, Stair training, Vestibular training, Canalith repositioning, Orthotic/Fit training, DME instructions, Aquatic Therapy, Dry  Needling, Taping, Manual therapy, and Re-evaluation  PLAN FOR NEXT SESSION: ergonomics retraining, postural exercises   Westley Foots, PT, DPT, CBIS 02/11/2023, 2:12 PM

## 2023-02-18 ENCOUNTER — Ambulatory Visit: Payer: Medicare HMO | Admitting: Physical Therapy

## 2023-02-18 ENCOUNTER — Encounter: Payer: Self-pay | Admitting: Physical Therapy

## 2023-02-18 VITALS — BP 144/95 | HR 85

## 2023-02-18 DIAGNOSIS — R262 Difficulty in walking, not elsewhere classified: Secondary | ICD-10-CM

## 2023-02-18 DIAGNOSIS — M6281 Muscle weakness (generalized): Secondary | ICD-10-CM

## 2023-02-18 DIAGNOSIS — R293 Abnormal posture: Secondary | ICD-10-CM | POA: Diagnosis not present

## 2023-02-18 NOTE — Therapy (Signed)
OUTPATIENT PHYSICAL THERAPY NEURO TREATMENT   Patient Name: Debbie Watts MRN: [id number] DOB:[birth year], 62 y.o., female Today's Date: 02/18/2023   PCP: Elza Rafter, DO REFERRING PROVIDER: Dickie La, MD   END OF SESSION:  PT End of Session - 02/18/23 1235     Visit Number 3    Number of Visits 5    Date for PT Re-Evaluation 03/04/23    Authorization Type [employer]    PT Start Time 1234    PT Stop Time 1314    PT Time Calculation (min) 40 min    Equipment Utilized During Treatment Gait belt    Activity Tolerance Patient tolerated treatment well    Behavior During Therapy WFL for tasks assessed/performed             Past Medical History:  Diagnosis Date   Acute cystitis without hematuria 03/16/2018   Arthritis    Asthma    Cancer (HCC)    chemo for breast ca  x43mos   Depression    Deviated nasal septum    Gait disturbance 01/12/2018   Hearing loss 06/11/2014   Bilateral   Herniated cervical disc    Hypertension    Leukocytosis 01/19/2019   Migraine    Miscarriage    2   MS (multiple sclerosis) (HCC)    Restless leg syndrome 06/23/2014   Shingles 12/24/2019   Sleep disturbance 07/10/2020   Sore throat 10/29/2021   Syncope 01/19/2019   Urinary tract infection without hematuria 08/10/2018   UTI (urinary tract infection), uncomplicated 06/09/2020   Wrist pain, acute, left 12/23/2021   Past Surgical History:  Procedure Laterality Date   BREAST BIOPSY Right 11/21/2018   Malignant   HEMATOMA EVACUATION Right 05/01/2019   Procedure: EVACUATION HEMATOMA;  Surgeon: Emelia Loron, MD;  Location: [facility] OR;  Service: General;  Laterality: Right;   MASTECTOMY Right 04/30/2019   MASTECTOMY W/ SENTINEL NODE BIOPSY Right 04/30/2019   Procedure: RIGHT MASTECTOMY WITH  RIGHT AXILLARY SENTINEL LYMPH NODE BIOPSY INJECT BLUE DYE;  Surgeon: Emelia Loron, MD;  Location: [facility] OR;  Service: General;  Laterality: Right;   NASAL SEPTUM SURGERY     SIMPLE  MASTECTOMY WITH AXILLARY SENTINEL NODE BIOPSY Right 04/30/2019   spinal injections     Patient Active Problem List   Diagnosis Date Noted   Depression 08/06/2021   Chronic migraine without aura without status migrainosus, not intractable 03/19/2021   Other headache syndrome 09/10/2020   High risk medication use 03/06/2020   Adhesive capsulitis of left shoulder 02/14/2020   Breast cancer, right (HCC) 04/30/2019   Port-A-Cath in place 02/13/2019   Malignant neoplasm of upper-outer quadrant of right breast in female, estrogen receptor positive (HCC) 11/27/2018   Chronic midline back pain 09/19/2018   Chronic left-sided thoracic back pain 07/20/2018   Solar lentigo 04/21/2018   Healthcare maintenance 04/20/2018   Easy bruising 08/31/2017   Chronic obstructive pulmonary disease (HCC) 11/30/2016   Essential hypertension 01/26/2016   Migraine headache 06/23/2014   Multiple sclerosis (HCC) 06/23/2014   Hearing loss, bilateral 06/11/2014   Major depression, recurrent, chronic (HCC) 06/11/2014   Cervical herniated disc 06/11/2014   Cigarette nicotine dependence without complication 06/11/2014   Lumbar herniated disc 06/11/2014    ONSET DATE: 01/26/23 referral  REFERRING DIAG: G35 (ICD-10-CM) - Multiple sclerosis   THERAPY DIAG:  Abnormal posture  Muscle weakness (generalized)  Difficulty in walking, not elsewhere classified  Rationale for Evaluation and Treatment: Rehabilitation  SUBJECTIVE:  SUBJECTIVE STATEMENT: Patient reports doing alright; denies falls/near falls. States she is getting the most pain when attempting to help boyfriend with ADLs. Patient reports full compliance with HEP; doesn't have any questions about it.. Patient reports that in order to help her boyfriend, she has to sit on her  knees and lift up with her arms on the bed to help get him high enough to get his clothes on. Patient also reports difficulty lifting bags of dirt in garden and tries to modify with dolly.   Pt accompanied by: self  PERTINENT HISTORY: RR MS, tobacco use, migraines, breast CA  PAIN:  Are you having pain? Yes: NPRS scale: 3/10 Pain location: lower back Pain description: achy Aggravating factors: assisting boyfriend with ADLs Relieving factors: pain medication  PRECAUTIONS: Fall  PATIENT GOALS: "I want to be able to walk more"  TODAY'S TREATMENT:                                                                                                                               Vitals:   02/18/23 1241  BP: (!) 144/95  Pulse: 85   Discussed BP safety, safe ranges, and need to follow up with PCP for management if numbers remain elevated.   TherEx: -Scifit hills level 2.5 for 8 minutes with bilateral UE and LE for neural priming, gentle strengthening, pain management, and AAROM  TherAct:  Lifting Ergonomics: - Initiated training with hip hinging and use of mirror for visual feedback 2 x 10 (max verbal, visual cues initially to prevent compensation through lumbar spine) - Lifting empty crate x 10 reps - Lifting 10 # crate x 10 reps (emphasis on hip hinge and leg use to come to stand) - Tall kneel on mat to simulate lifting boyfriend with crate + 10# weight 2 x 8 (emphasis on hip hinge)  - Lifting 25# kettle bell 2 x 5 - Lifting 25# kettle bell, side stepping x 8 feet laterally with proper lifting technique, setting back down x 4 (SBA - reduced clearance of RLE with side stepping)  *Discussed minimizing overcompensation with arms or back during lifting   PATIENT EDUCATION: Education details: Continue HEP  Person educated: Patient Education method: Explanation Education comprehension: verbalized understanding and needs further education  HOME EXERCISE PROGRAM: Access Code:  [email or web address] URL: [email or web address] Date: 02/11/2023 Prepared by: Merry Lofty  Exercises - Supine Bridge  - 1 x daily - 7 x weekly - 3 sets - 10 reps - Supine Lower Trunk Rotation  - 1 x daily - 7 x weekly - 3 sets - 10 reps - Supine Single Knee to Chest Stretch  - 1 x daily - 7 x weekly - 3 sets - 30 hold - Supine March with Posterior Pelvic Tilt  - 1 x daily - 7 x weekly - 3 sets - 10 reps - Clamshell  - 1 x daily - 7 x weekly - 3 sets - 10 reps - Mini  Squat with Counter Support  - 1 x daily - 7 x weekly - 3 sets - 10 reps - Standing Knee Flexion with Counter Support  - 1 x daily - 7 x weekly - 3 sets - 10 reps - Standing Hip Extension with Counter Support  - 1 x daily - 7 x weekly - 3 sets - 10 reps - Standing Hip Abduction with Counter Support  - 1 x daily - 7 x weekly - 3 sets - 10 reps - Heel Raises with Counter Support  - 1 x daily - 7 x weekly - 3 sets - 10 reps - Seated Ankle Alphabet  - 1 x daily - 7 x weekly - 1 sets - 1 reps  GOALS: Goals reviewed with patient? Yes  SHORT TERM GOALS: = LTG based on POC length   LONG TERM GOALS: Target date: 03/04/23  Pt will be independent with final HEP for improved functional strength and posture/ergonomics  Baseline: to be provided Goal status: INITIAL  2.  Pt will improve 5x STS to </= 20 sec to demo improved functional LE strength and balance   Baseline: 24.09s no UE Goal status: INITIAL  3.  Pt will improve gait speed to >/= .71m/s to demonstrate improved community ambulation  Baseline: .17m/s Goal status: INITIAL  4.  Patient will improve LEFS score to >/= 41/80 to demonstrate improved function with R LE Baseline: 32/80 Goal status: INITIAL  5.  Patient will improve Oswestry score to </= 15 to demonstrate improved function with reduction in symptoms  Baseline: 21 Goal status: INITIAL  ASSESSMENT:  CLINICAL IMPRESSION: Session emphasized gentle warmup on Nustep and then transition to lifting  ergometrics. Patient required initial cueing to minimize lumbar and upper extremity overcompensation by utlizing hip hinge and lower extremities. Tolerated well in today's session but will likely benefit form reinforcement. Continue POC.   OBJECTIVE IMPAIRMENTS: Abnormal gait, decreased activity tolerance, decreased endurance, decreased knowledge of condition, decreased strength, improper body mechanics, postural dysfunction, and pain.   ACTIVITY LIMITATIONS: carrying, lifting, bending, standing, locomotion level, and caring for others  PARTICIPATION LIMITATIONS: meal prep, cleaning, interpersonal relationship, driving, shopping, community activity, occupation, and yard work  PERSONAL FACTORS: Age, Fitness, Past/current experiences, Sex, Time since onset of injury/illness/exacerbation, and 3+ comorbidities: see above  are also affecting patient's functional outcome.   REHAB POTENTIAL: Fair time since onset  CLINICAL DECISION MAKING: Stable/uncomplicated  EVALUATION COMPLEXITY: Low  PLAN:  PT FREQUENCY: 1x/week per patient request  PT DURATION: 4 weeks  PLANNED INTERVENTIONS: Therapeutic exercises, Therapeutic activity, Neuromuscular re-education, Balance training, Gait training, Patient/Family education, Self Care, Joint mobilization, Stair training, Vestibular training, Canalith repositioning, Orthotic/Fit training, DME instructions, Aquatic Therapy, Dry Needling, Taping, Manual therapy, and Re-evaluation  PLAN FOR NEXT SESSION: ergonomics retraining, postural exercises, conversation about possible HH caregiver training to improve compensation strategies  Carmelia Bake, PT, DPT 02/18/2023, 1:59 PM

## 2023-02-25 ENCOUNTER — Ambulatory Visit: Payer: Medicare HMO

## 2023-02-25 ENCOUNTER — Telehealth: Payer: Self-pay | Admitting: Neurology

## 2023-02-25 DIAGNOSIS — G35 Multiple sclerosis: Secondary | ICD-10-CM

## 2023-02-25 NOTE — Telephone Encounter (Signed)
Emil @ [employer] has called to report to pt has been approved a 21 day complimentary supply of the Vumerity.  He is asking a Rx be faxed over to [phone]

## 2023-02-28 MED ORDER — VUMERITY 231 MG PO CPDR: 2.0000 | DELAYED_RELEASE_CAPSULE | Freq: Two times a day (BID) | ORAL | 0 refills | Status: DC

## 2023-02-28 NOTE — Telephone Encounter (Signed)
E-scribed rx to [employer]

## 2023-03-04 ENCOUNTER — Ambulatory Visit: Payer: Medicare HMO | Attending: Internal Medicine | Admitting: Physical Therapy

## 2023-03-04 ENCOUNTER — Encounter: Payer: Self-pay | Admitting: Physical Therapy

## 2023-03-04 VITALS — BP 137/93 | HR 79

## 2023-03-04 DIAGNOSIS — R262 Difficulty in walking, not elsewhere classified: Secondary | ICD-10-CM | POA: Diagnosis present

## 2023-03-04 DIAGNOSIS — M6281 Muscle weakness (generalized): Secondary | ICD-10-CM | POA: Insufficient documentation

## 2023-03-04 DIAGNOSIS — R293 Abnormal posture: Secondary | ICD-10-CM

## 2023-03-04 NOTE — Therapy (Signed)
OUTPATIENT PHYSICAL THERAPY NEURO TREATMENT / DISCHARGE   Patient Name: Debbie Watts MRN: [id number] DOB:[birth year], 62 y.o., female Today's Date: 03/04/2023   PCP: Elza Rafter, DO REFERRING PROVIDER: Dickie La, MD   END OF SESSION:  PT End of Session - 03/04/23 1235     Visit Number 4    Number of Visits 5    Date for PT Re-Evaluation 03/04/23    Authorization Type [employer]    PT Start Time 1233    PT Stop Time 1304    PT Time Calculation (min) 31 min    Equipment Utilized During Treatment Gait belt    Activity Tolerance Patient tolerated treatment well    Behavior During Therapy WFL for tasks assessed/performed             Past Medical History:  Diagnosis Date   Acute cystitis without hematuria 03/16/2018   Arthritis    Asthma    Cancer (HCC)    chemo for breast ca  x65mos   Depression    Deviated nasal septum    Gait disturbance 01/12/2018   Hearing loss 06/11/2014   Bilateral   Herniated cervical disc    Hypertension    Leukocytosis 01/19/2019   Migraine    Miscarriage    2   MS (multiple sclerosis) (HCC)    Restless leg syndrome 06/23/2014   Shingles 12/24/2019   Sleep disturbance 07/10/2020   Sore throat 10/29/2021   Syncope 01/19/2019   Urinary tract infection without hematuria 08/10/2018   UTI (urinary tract infection), uncomplicated 06/09/2020   Wrist pain, acute, left 12/23/2021   Past Surgical History:  Procedure Laterality Date   BREAST BIOPSY Right 11/21/2018   Malignant   HEMATOMA EVACUATION Right 05/01/2019   Procedure: EVACUATION HEMATOMA;  Surgeon: Emelia Loron, MD;  Location: [facility] OR;  Service: General;  Laterality: Right;   MASTECTOMY Right 04/30/2019   MASTECTOMY W/ SENTINEL NODE BIOPSY Right 04/30/2019   Procedure: RIGHT MASTECTOMY WITH  RIGHT AXILLARY SENTINEL LYMPH NODE BIOPSY INJECT BLUE DYE;  Surgeon: Emelia Loron, MD;  Location: [facility] OR;  Service: General;  Laterality: Right;   NASAL SEPTUM SURGERY      SIMPLE MASTECTOMY WITH AXILLARY SENTINEL NODE BIOPSY Right 04/30/2019   spinal injections     Patient Active Problem List   Diagnosis Date Noted   Depression 08/06/2021   Chronic migraine without aura without status migrainosus, not intractable 03/19/2021   Other headache syndrome 09/10/2020   High risk medication use 03/06/2020   Adhesive capsulitis of left shoulder 02/14/2020   Breast cancer, right (HCC) 04/30/2019   Port-A-Cath in place 02/13/2019   Malignant neoplasm of upper-outer quadrant of right breast in female, estrogen receptor positive (HCC) 11/27/2018   Chronic midline back pain 09/19/2018   Chronic left-sided thoracic back pain 07/20/2018   Solar lentigo 04/21/2018   Healthcare maintenance 04/20/2018   Easy bruising 08/31/2017   Chronic obstructive pulmonary disease (HCC) 11/30/2016   Essential hypertension 01/26/2016   Migraine headache 06/23/2014   Multiple sclerosis (HCC) 06/23/2014   Hearing loss, bilateral 06/11/2014   Major depression, recurrent, chronic (HCC) 06/11/2014   Cervical herniated disc 06/11/2014   Cigarette nicotine dependence without complication 06/11/2014   Lumbar herniated disc 06/11/2014    ONSET DATE: 01/26/23 referral  REFERRING DIAG: G35 (ICD-10-CM) - Multiple sclerosis   THERAPY DIAG:  Abnormal posture - Plan: PT plan of care cert/re-cert  Muscle weakness (generalized) - Plan: PT plan of care cert/re-cert  Difficulty in walking, not  elsewhere classified - Plan: PT plan of care cert/re-cert  Rationale for Evaluation and Treatment: Rehabilitation  SUBJECTIVE:                                                                                                                                                                                             SUBJECTIVE STATEMENT: Patient reports she is doing well today; reports that she needs to discharge today due to financial concerns. She is wanting to review/update HEP today so she can continue  to work on things at home as well.   Pt accompanied by: self  PERTINENT HISTORY: RR MS, tobacco use, migraines, breast CA  PAIN:  Are you having pain? Yes: NPRS scale: 3/10 Pain location: lower back Pain description: achy Aggravating factors: assisting boyfriend with ADLs Relieving factors: pain medication  PRECAUTIONS: Fall  PATIENT GOALS: "I want to be able to walk more"  TODAY'S TREATMENT:                                                                                                                               Vitals:   03/04/23 1237  BP: (!) 137/93  Pulse: 79    Discussed BP safety, safe ranges, and need to follow up with PCP for management if numbers remain elevated.   TherEx: -Discussed discharge HEP; reviewed exercises noted below; discussed free programs patient could try at home including seated chair yoga/chair exercises classes with emphasis on mobility; patient reported wrist pain and provided education on hand dexterity to prevent stiffness and encouraged follow up with OT in future if finances allow  TherAct:  Assessed final goals as noted below and improvements from discharge:  Oswestery: 11/50 LEFS: 45/80 or 56.3% Impairment   [facility] PT Assessment - 03/04/23 0001       Standardized Balance Assessment   Five times sit to stand comments  19.34   sec without UE support   10 Meter Walk 0.27m/s   modI           PATIENT EDUCATION: Education details: Continue HEP, D/C info as  noted above Person educated: Patient Education method: Explanation Education comprehension: verbalized understanding and needs further education  HOME EXERCISE PROGRAM: Access Code: [email or web address] URL: [email or web address] Date: 02/11/2023 Prepared by: Merry Lofty  Exercises - Supine Bridge  - 1 x daily - 7 x weekly - 3 sets - 10 reps - Supine Lower Trunk Rotation  - 1 x daily - 7 x weekly - 3 sets - 10 reps - Supine Single Knee to Chest Stretch  - 1 x daily -  7 x weekly - 3 sets - 30 hold - Supine March with Posterior Pelvic Tilt  - 1 x daily - 7 x weekly - 3 sets - 10 reps - Clamshell  - 1 x daily - 7 x weekly - 3 sets - 10 reps - Mini Squat with Counter Support  - 1 x daily - 7 x weekly - 3 sets - 10 reps - Standing Knee Flexion with Counter Support  - 1 x daily - 7 x weekly - 3 sets - 10 reps - Standing Hip Extension with Counter Support  - 1 x daily - 7 x weekly - 3 sets - 10 reps - Standing Hip Abduction with Counter Support  - 1 x daily - 7 x weekly - 3 sets - 10 reps - Heel Raises with Counter Support  - 1 x daily - 7 x weekly - 3 sets - 10 reps - Seated Ankle Alphabet  - 1 x daily - 7 x weekly - 1 sets - 1 reps  GOALS: Goals reviewed with patient? Yes  SHORT TERM GOALS: = LTG based on POC length   LONG TERM GOALS: Target date: 03/04/23  Pt will be independent with final HEP for improved functional strength and posture/ergonomics  Baseline: Reports confidence in exercise Goal status: MET  2.  Pt will improve 5x STS to </= 20 sec to demo improved functional LE strength and balance   Baseline: 24.09s no UE; improved 19.34 without UE Goal status: MET  3.  Pt will improve gait speed to >/= .52m/s to demonstrate improved community ambulation  Baseline: .97m/s; improved to 0.51m/s Goal status: MET  4.  Patient will improve LEFS score to >/= 41/80 to demonstrate improved function with R LE Baseline: 32/80; 45/80 or 56.3% Impairment Goal status: MET  5.  Patient will improve Oswestry score to </= 15 to demonstrate improved function with reduction in symptoms  Baseline: 21; improved to 11/50 Goal status: MET  ASSESSMENT:  CLINICAL IMPRESSION: Patient is discharging today for physical therapy due to personal request due to external factors and due to achieving all stated rehab goals. Patient demonstrates independence in HEP and demonstrates reduced levels of LE and low back disability. Patient is still within falls category for 5xSTS  and gait speed but demonstrates appropriate safety awareness. Patient is no longer in need of skilled services at this time.   OBJECTIVE IMPAIRMENTS: Abnormal gait, decreased activity tolerance, decreased endurance, decreased knowledge of condition, decreased strength, improper body mechanics, postural dysfunction, and pain.   ACTIVITY LIMITATIONS: carrying, lifting, bending, standing, locomotion level, and caring for others  PARTICIPATION LIMITATIONS: meal prep, cleaning, interpersonal relationship, driving, shopping, community activity, occupation, and yard work  PERSONAL FACTORS: Age, Fitness, Past/current experiences, Sex, Time since onset of injury/illness/exacerbation, and 3+ comorbidities: see above  are also affecting patient's functional outcome.   REHAB POTENTIAL: Fair time since onset  CLINICAL DECISION MAKING: Stable/uncomplicated  EVALUATION COMPLEXITY: Low  PLAN:  PT FREQUENCY: 1x/week per patient request (Re-cert updated  03/04/2023)  PT DURATION: 4 weeks  PLANNED INTERVENTIONS: Therapeutic exercises, Therapeutic activity, Neuromuscular re-education, Balance training, Gait training, Patient/Family education, Self Care, Joint mobilization, Stair training, Vestibular training, Canalith repositioning, Orthotic/Fit training, DME instructions, Aquatic Therapy, Dry Needling, Taping, Manual therapy, and Re-evaluation  PLAN FOR NEXT SESSION: NA - D/C  Carmelia Bake, PT, DPT 03/04/2023, 4:13 PM  PHYSICAL THERAPY DISCHARGE SUMMARY  Visits from Start of Care: 4  Current functional level related to goals / functional outcomes: Achieved all rehab goals   Remaining deficits: Continues to be a falls risk but demonstrates appropriate safety measures   Education / Equipment: Updated HEP, encouraged patient to reach out if she needed any community resources guides to help manage finances, options to return to therapy if finances allow  Patient agrees to discharge. Patient goals  were met. Patient is being discharged due to meeting the stated rehab goals.

## 2023-03-08 ENCOUNTER — Other Ambulatory Visit: Payer: Self-pay | Admitting: Neurology

## 2023-03-08 ENCOUNTER — Ambulatory Visit (HOSPITAL_COMMUNITY)
Admission: RE | Admit: 2023-03-08 | Discharge: 2023-03-08 | Disposition: A | Discharge status: Home / Self Care | Payer: Medicare HMO | Source: Ambulatory Visit | Attending: Internal Medicine | Admitting: Internal Medicine

## 2023-03-08 DIAGNOSIS — F17200 Nicotine dependence, unspecified, uncomplicated: Secondary | ICD-10-CM

## 2023-03-08 DIAGNOSIS — Z122 Encounter for screening for malignant neoplasm of respiratory organs: Secondary | ICD-10-CM | POA: Insufficient documentation

## 2023-03-08 DIAGNOSIS — F17210 Nicotine dependence, cigarettes, uncomplicated: Secondary | ICD-10-CM | POA: Insufficient documentation

## 2023-04-06 ENCOUNTER — Telehealth: Payer: Self-pay | Admitting: *Deleted

## 2023-04-06 NOTE — Telephone Encounter (Signed)
Faxed form below back to [employer]. Received fax confirmation.

## 2023-04-19 NOTE — Progress Notes (Signed)
No evidence of lung cancer/lung nodules, although CT suggestive of COPD. Patient called and made aware.

## 2023-04-21 ENCOUNTER — Encounter: Payer: Self-pay | Admitting: Neurology

## 2023-04-21 ENCOUNTER — Ambulatory Visit: Payer: Medicare HMO | Admitting: Neurology

## 2023-04-21 VITALS — BP 158/94 | HR 82 | Wt 128.9 lb

## 2023-04-21 DIAGNOSIS — G35 Multiple sclerosis: Secondary | ICD-10-CM | POA: Diagnosis not present

## 2023-04-21 DIAGNOSIS — C50411 Malignant neoplasm of upper-outer quadrant of right female breast: Secondary | ICD-10-CM

## 2023-04-21 DIAGNOSIS — G8929 Other chronic pain: Secondary | ICD-10-CM

## 2023-04-21 DIAGNOSIS — Z79899 Other long term (current) drug therapy: Secondary | ICD-10-CM

## 2023-04-21 DIAGNOSIS — G2581 Restless legs syndrome: Secondary | ICD-10-CM | POA: Diagnosis not present

## 2023-04-21 DIAGNOSIS — R269 Unspecified abnormalities of gait and mobility: Secondary | ICD-10-CM

## 2023-04-21 DIAGNOSIS — M549 Dorsalgia, unspecified: Secondary | ICD-10-CM

## 2023-04-21 DIAGNOSIS — Z170 Estrogen receptor positive status [ER+]: Secondary | ICD-10-CM

## 2023-04-21 DIAGNOSIS — R202 Paresthesia of skin: Secondary | ICD-10-CM

## 2023-04-21 MED ORDER — DIMETHYL FUMARATE 240 MG PO CPDR: DELAYED_RELEASE_CAPSULE | ORAL | 11 refills | Status: DC

## 2023-04-21 MED ORDER — OXYBUTYNIN CHLORIDE ER 10 MG PO TB24
10.0000 mg | ORAL_TABLET | Freq: Every day | ORAL | 3 refills | Status: DC
Start: 2023-04-21 — End: 2024-01-23

## 2023-04-21 NOTE — Progress Notes (Signed)
[facility]  PATIENT: Debbie Watts DOB: 14-Sep-1961    HISTORICAL  CHIEF COMPLAINT:  Chief Complaint  Patient presents with   Room 10    Pt is here Alone. Pt states that she has been doing well with her MS. Pt states she wants to talk about a different medication that will be covered with her insurance.     HISTORY OF PRESENT ILLNESS:  Debbie Watts is a 62 y.o. woman with relapsing remitting multiple sclerosis.  Update 07/04/62 She is on Vumerity and tolerates it well.  However the program for charity/free drug has been difficult No definite exacerbation or new neurologic symptoms.   Last lymphocyte count was 0.6 and we reduced her dose.    She has mild reduced gait but she can walk a mile in 25 minutes but then needs a rest due to LBP, COPD and fatigue   She also has LBP if she stands up 20 minutes.    As she walks longer, her back hurts more.    Her right leg is weaker than her left and more clumsy.   Arms are more symmetric.   She has dysesthesias in her legs/feet.   Also back pain.     She has urinary urgency and rare urge incontinence  Vision is fine.        She has LBP worse when she rises from a chair, bed or a floor.  Lumbar MRI 2020 showed DJD but no spinal stenosis.    Headaches have done better the last few months.    She takes a tramadol when one occurs.  She has frequent headaches arising form the left occiput.   Mood is doing better.   She notes some fatigue.  She sleeps ok most nights but some sleep maintenance insomnia.   She is on Sinemet at bedtme only for RLS.     She was diagnosed with breast cancer January 2020.   She did TCHP (docetaxel, carboplatin, trastuzumab, pertuzumab (Perjeta)).   She has had 5 cycles of chemotherapy and her last infusion will be June 2.      She is on anastrozole now.    She sees  Dr. Pamelia Hoit twice a year.      MS history:  She was diagnosed with MS in 1999 based on MRI and lumbar rupture.  At that time she  was living in [place] and was seeing Dr. Gertha Calkin.  When he retired she saw another doctor and then she moved to this area around 2015 but did not seek neurologic referral until 2015.  She was on Avonex for many years but stopped due to insurance.  Then, in November 2016, she started in the drug study and was on ALKS 8700 4 2 years.  The study ended in late 2018 for her and she switched over to Tecfidera.      Images: MRI brain showed multiple T2/FLAIR hyperintense foci in the hemispheres in a pattern and configuration consistent with chronic demyelinating plaque associated with multiple sclerosis.  None of the foci appear to be acute.  They do not enhance.  Compared to the MRI dated 08/09/2017, there are no new lesions.  MRI cervical spine showed 3 T2 hyperintense foci within the spinal cord adjacent to C2, C4-C5 and C6-C7.  All of these were present on the 2015 MRI.  They do not enhance.  They are consistent with chronic demyelinating plaque associated with multiple sclerosis.   Moderate spinal stenosis at C5-C6 and mild  spinal stenosis at C4-C5 and C6-C7.  There does not appear to be any nerve root compression at these levels.  Degenerative changes at C5-C6 have mildly progressed compared to the 2015 MRI.  MRI brain 02/19/2020  T2/FLAIR hyperintense foci in the hemispheres in a pattern and configuration consistent with chronic demyelinating plaque associated with multiple sclerosis.  None of the foci appear to be acute.  They do not enhance.  Compared to the MRI dated 08/09/2017, there are no new lesions  MRI lumbar 09/24/2019 showed  mild DJD but no spinal stenosis.  No   REVIEW OF SYSTEMS: Constitutional: No fevers, chills, sweats, or change in appetite.  She has fatigue.  She sometimes has insomnia. Eyes: No visual changes, double vision, eye pain Ear, nose and throat: No hearing loss, ear pain, nasal congestion, sore throat Cardiovascular: No chest pain, palpitations Respiratory:  No shortness of  breath at rest or with exertion.   No wheezes GastrointestinaI: No nausea, vomiting, diarrhea, abdominal pain, fecal incontinence Genitourinary: She has urinary frequency and nocturia . Musculoskeletal:  No neck pain, back pain Integumentary: No rash, pruritus, skin lesions Neurological: as above Psychiatric: No depression at this time.  No anxiety Endocrine: No palpitations, diaphoresis, change in appetite, change in weigh or increased thirst Hematologic/Lymphatic:  No anemia, purpura, petechiae. Allergic/Immunologic: No itchy/runny eyes, nasal congestion, recent allergic reactions, rashes  ALLERGIES: Allergies  Allergen Reactions   Procaine Shortness Of Breath    HOME MEDICATIONS:  Current Outpatient Medications:    albuterol (VENTOLIN HFA) 108 (90 Base) MCG/ACT inhaler, INHALE 1-2 PUFFS BY MOUTH EVERY 6 HOURS AS NEEDED FOR WHEEZE OR SHORTNESS OF BREATH, Disp: 18 each, Rfl: 1   anastrozole (ARIMIDEX) 1 MG tablet, Take 1 tablet (1 mg total) by mouth daily., Disp: 90 tablet, Rfl: 3   aspirin-acetaminophen-caffeine (EXCEDRIN MIGRAINE) 250-250-65 MG tablet, Take 2 tablets by mouth every 6 (six) hours as needed for migraine., Disp: , Rfl:    carbidopa-levodopa (SINEMET IR) 10-100 MG tablet, TAKE 1 TABLET BY MOUTH EVERYDAY AT BEDTIME, Disp: 90 tablet, Rfl: 0   cyclobenzaprine (FLEXERIL) 5 MG tablet, TAKE 1 TABLET BY MOUTH 2 TIMES DAILY AS NEEDED FOR MUSCLE SPASMS., Disp: 60 tablet, Rfl: 5   Dimethyl Fumarate 240 MG CPDR, One po bid, Disp: 60 capsule, Rfl: 11   fluticasone (FLONASE) 50 MCG/ACT nasal spray, PLACE 2 SPRAYS INTO BOTH NOSTRILS AT BEDTIME., Disp: 48 mL, Rfl: 2   gabapentin (NEURONTIN) 300 MG capsule, TAKE 2 CAPSULES BY MOUTH 2 TIMES DAILY., Disp: 360 capsule, Rfl: 3   lisinopril (ZESTRIL) 10 MG tablet, Take 1 tablet (10 mg total) by mouth daily., Disp: 30 tablet, Rfl: 11   Multiple Vitamin (MULTIVITAMIN WITH MINERALS) TABS tablet, Take 2 tablets by mouth daily., Disp: , Rfl:     omeprazole (PRILOSEC OTC) 20 MG tablet, Take 20 mg by mouth daily as needed (acid reflux)., Disp: , Rfl:    oxybutynin (DITROPAN XL) 10 MG 24 hr tablet, Take 1 tablet (10 mg total) by mouth at bedtime., Disp: 90 tablet, Rfl: 3   PARoxetine (PAXIL) 20 MG tablet, TAKE 1 TABLET BY MOUTH EVERY DAY, Disp: 90 tablet, Rfl: 1   traMADol (ULTRAM) 50 MG tablet, TAKE 1 TABLET BY MOUTH TWICE A DAY AS NEEDED, Disp: 60 tablet, Rfl: 5   valACYclovir (VALTREX) 1000 MG tablet, Take 1 tablet (1,000 mg total) by mouth 2 (two) times daily. (Patient not taking: Reported on 04/21/2023), Disp: 14 tablet, Rfl: 0  PAST MEDICAL HISTORY: Past Medical  History:  Diagnosis Date   Acute cystitis without hematuria 03/16/2018   Arthritis    Asthma    Cancer (HCC)    chemo for breast ca  x56mos   Depression    Deviated nasal septum    Gait disturbance 01/12/2018   Hearing loss 06/11/2014   Bilateral   Herniated cervical disc    Hypertension    Leukocytosis 01/19/2019   Migraine    Miscarriage    2   MS (multiple sclerosis) (HCC)    Restless leg syndrome 06/23/2014   Shingles 12/24/2019   Sleep disturbance 07/10/2020   Sore throat 10/29/2021   Syncope 01/19/2019   Urinary tract infection without hematuria 08/10/2018   UTI (urinary tract infection), uncomplicated 06/09/2020   Wrist pain, acute, left 12/23/2021    PAST SURGICAL HISTORY: Past Surgical History:  Procedure Laterality Date   BREAST BIOPSY Right 11/21/2018   Malignant   HEMATOMA EVACUATION Right 05/01/2019   Procedure: EVACUATION HEMATOMA;  Surgeon: Emelia Loron, MD;  Location[facility] OR;  Service: General;  Laterality: Right;   MASTECTOMY Right 04/30/2019   MASTECTOMY W/ SENTINEL NODE BIOPSY Right 04/30/2019   Procedure: RIGHT MASTECTOMY WITH  RIGHT AXILLARY SENTINEL LYMPH NODE BIOPSY INJECT BLUE DYE;  Surgeon: Emelia Loron, MD;  Location: [facility] OR;  Service: General;  Laterality: Right;   NASAL SEPTUM SURGERY     SIMPLE MASTECTOMY WITH  AXILLARY SENTINEL NODE BIOPSY Right 04/30/2019   spinal injections      FAMILY HISTORY: Family History  Adopted: Yes  Problem Relation Age of Onset   Cancer Maternal Uncle        throat    SOCIAL HISTORY:  Social History   Socioeconomic History   Marital status: Single    Spouse name: Not on file   Number of children: 2   Years of education: 12   Highest education level: Not on file  Occupational History   Occupation: Disabled  Tobacco Use   Smoking status: Every Day    Packs/day: 1.00    Years: 44.00    Additional pack years: 0.00    Total pack years: 44.00    Types: Cigarettes   Smokeless tobacco: Never   Tobacco comments:    1 pk per day  Vaping Use   Vaping Use: Former  Substance and Sexual Activity   Alcohol use: Yes    Comment: 0-5 beers daily   Drug use: Yes    Types: Marijuana   Sexual activity: Not Currently    Partners: Male  Other Topics Concern   Not on file  Social History Narrative   Patient is single with 2 children.   Patient is right handed.   Current Social History 08/10/2018        Right handed       Patient lives with significant other Salvatore Decent) in one level Townhome 08/10/2018    Transportation: Patient has own vehicle  08/10/2018   Important Relationships Salvatore Decent 08/10/2018    Pets: one dog named Browser 08/10/2018   Education / Work:  12th grade/ Disabled 08/10/2018   Interests / Fun: Watching baseball and Nascar 08/10/2018   Current Stressors: Significant other is depressed over his poor health 08/10/2018   Religious / Personal Beliefs: "I believe in God" 08/10/2018   Other: "I had a miscarriage before I had my 2 sons." 08/10/2018   L. Leward Quan, RN, BSN  Social Determinants of Health   Financial Resource Strain: Low Risk  (01/26/2023)   Overall Financial Resource Strain (CARDIA)    Difficulty of Paying Living Expenses: Not very  hard  Food Insecurity: No Food Insecurity (01/26/2023)   Hunger Vital Sign    Worried About Running Out of Food in the Last Year: Never true    Ran Out of Food in the Last Year: Never true  Transportation Needs: No Transportation Needs (01/26/2023)   PRAPARE - Administrator, Civil Service (Medical): No    Lack of Transportation (Non-Medical): No  Physical Activity: Inactive (01/26/2023)   Exercise Vital Sign    Days of Exercise per Week: 0 days    Minutes of Exercise per Session: 0 min  Stress: Stress Concern Present (01/26/2023)   [employer] of Occupational Health - Occupational Stress Questionnaire    Feeling of Stress : To some extent  Social Connections: Moderately Isolated (01/26/2023)   Social Connection and Isolation Panel [NHANES]    Frequency of Communication with Friends and Family: Once a week    Frequency of Social Gatherings with Friends and Family: Never    Attends Religious Services: More than 4 times per year    Active Member of [employer] or Organizations: No    Attends Banker Meetings: Never    Marital Status: Living with partner  Intimate Partner Violence: Not At Risk (01/26/2023)   Humiliation, Afraid, Rape, and Kick questionnaire    Fear of Current or Ex-Partner: No    Emotionally Abused: No    Physically Abused: No    Sexually Abused: No     PHYSICAL EXAM  Vitals:   04/21/23 1114  BP: (!) 158/94  Pulse: 82  Weight: 128 lb 14.4 oz (58.5 kg)    Body mass index is 23.58 kg/m.   General: The patient is well-developed and well-nourished and in no acute distress.   She has tenderness in the left occiput radiating up her skull with deep palpation.  Neurologic Exam  Mental status: The patient is alert and oriented x 3 at the time of the examination. The patient has apparent normal recent and remote memory, with an apparently normal attention span and concentration ability.   Speech is normal.  Cranial nerves: Extraocular movements are  full.  Facial strength and sensation was normal.  The trapezius strength was normal bilaterally.  The tongue is midline, and the patient has symmetric elevation of the soft palate. Bilateral hearing deficits are noted.  Motor:  Muscle bulk is normal.   Tone is slightly increased in legs. Strength is  5 / 5 in all 4 extremities.   Sensory: She had slight asymmetry of sensation, reduced on the left to touch and vibration  Coordination: Cerebellar testing shows good finger-nose-finger.  Heel-to-shin is mildly reduced bilaterally.  Gait and station: Station is normal.  Her gait is mildly wide and mildly spastic.  The tandem gait is wide.  Romberg is negative.  Reflexes: Deep tendon reflexes are symmetric and normal in arms, but increased in legs but no clonus at ankles.         DIAGNOSTIC DATA (LABS, IMAGING, TESTING) - I reviewed patient records, labs, notes, testing and imaging myself where available.  Lab Results  Component Value Date   WBC 4.7 01/18/2023   HGB 13.7 01/18/2023   HCT 40.9 01/18/2023   MCV 92 01/18/2023   PLT 261 01/18/2023      Component Value Date/Time   NA 140  10/19/2022 1135   K 4.0 10/19/2022 1135   CL 101 10/19/2022 1135   CO2 26 10/19/2022 1135   GLUCOSE 103 (H) 10/19/2022 1135   GLUCOSE 93 12/12/2019 1330   BUN 14 10/19/2022 1135   CREATININE 0.71 10/19/2022 1135   CREATININE 0.68 12/12/2019 1330   CALCIUM 8.8 10/19/2022 1135   PROT 6.4 10/19/2022 1135   ALBUMIN 4.5 10/19/2022 1135   AST 18 10/19/2022 1135   AST 35 12/12/2019 1330   ALT 18 10/19/2022 1135   ALT 44 12/12/2019 1330   ALKPHOS 93 10/19/2022 1135   BILITOT 0.3 10/19/2022 1135   BILITOT 0.4 12/12/2019 1330   GFRNONAA 97 06/02/2020 1422   GFRNONAA >60 12/12/2019 1330   GFRAA 112 06/02/2020 1422   GFRAA >60 12/12/2019 1330   Lab Results  Component Value Date   CHOL 127 06/02/2020   HDL 55 06/02/2020   LDLCALC 56 06/02/2020   TRIG 82 06/02/2020   CHOLHDL 2.3 06/02/2020   Lab  Results  Component Value Date   HGBA1C 5.3 12/07/2017   ________________________________________________   Multiple sclerosis (HCC)  High risk medication use  Gait disturbance  RLS (restless legs syndrome)  Chronic midline back pain, unspecified back location  Malignant neoplasm of upper-outer quadrant of right breast in female, estrogen receptor positive (HCC)  Paresthesias   1.   Change Vumerity to Tecfidera.   Check CBC/Diff and CMP.     If lymphocyte counts low we might change to 120 mg po bid would need to consider changing to a different medication (such as Aubagio) we will need to take her history of breast cancer into account if we make a change in therapy. 2.   Continue Flexeril.  Tylenol as needed pain 3.   Stay active and exercise as tolerated. 4.   Oxybutynin for bladder 5.    rtc in 6 months or sooner if new or worsening issues.  Sudais Banghart A. Epimenio Foot, MD, [facility] 04/21/2023, 11:42 AM Certified in Neurology, Clinical Neurophysiology, Sleep Medicine, Pain Medicine and Neuroimaging  [facility] [address], [address] [place], [place] [address] [phone]

## 2023-04-22 LAB — CBC WITH DIFFERENTIAL/PLATELET
Basophils Absolute: 0.1 x10E3/uL (ref 0.0–0.2)
Basos: 2 %
EOS (ABSOLUTE): 0.1 x10E3/uL (ref 0.0–0.4)
Eos: 2 %
Hematocrit: 40.8 % (ref 34.0–46.6)
Hemoglobin: 13.8 g/dL (ref 11.1–15.9)
Immature Grans (Abs): 0 x10E3/uL (ref 0.0–0.1)
Immature Granulocytes: 0 %
Lymphocytes Absolute: 0.8 x10E3/uL (ref 0.7–3.1)
Lymphs: 18 %
MCH: 30.3 pg (ref 26.6–33.0)
MCHC: 33.8 g/dL (ref 31.5–35.7)
MCV: 90 fL (ref 79–97)
Monocytes Absolute: 0.6 x10E3/uL (ref 0.1–0.9)
Monocytes: 13 %
Neutrophils Absolute: 2.8 x10E3/uL (ref 1.4–7.0)
Neutrophils: 65 %
Platelets: 263 x10E3/uL (ref 150–450)
RBC: 4.56 x10E6/uL (ref 3.77–5.28)
RDW: 12.6 % (ref 11.7–15.4)
WBC: 4.3 x10E3/uL (ref 3.4–10.8)

## 2023-04-27 ENCOUNTER — Telehealth: Payer: Self-pay | Admitting: Neurology

## 2023-04-27 NOTE — Telephone Encounter (Signed)
Pt called needing to speak to the RN regarding her MS medications. Please advise.

## 2023-05-02 MED ORDER — DIMETHYL FUMARATE 240 MG PO CPDR: DELAYED_RELEASE_CAPSULE | ORAL | 11 refills | Status: DC

## 2023-05-02 NOTE — Telephone Encounter (Signed)
I called pt , she states that her copay for vumerity is $100.00 she and not afford that she does not want to do pt assistance.  Looking at the last OV it shows that Dr. Epimenio Foot was going to change her to generic tecfidera.  (Dimethyl fumarate).  [facility].  I have looked on Cost Plus meds and the cost is $39.50 per month.  I relayed to pt and she will look at the cost plus drug website.  I sent the prescription there.   Pt was agreeable to cost. Her labs looked good.

## 2023-05-10 ENCOUNTER — Telehealth: Payer: Self-pay | Admitting: Neurology

## 2023-05-10 NOTE — Telephone Encounter (Signed)
error 

## 2023-06-21 ENCOUNTER — Other Ambulatory Visit: Payer: Self-pay | Admitting: Internal Medicine

## 2023-06-21 DIAGNOSIS — I10 Essential (primary) hypertension: Secondary | ICD-10-CM

## 2023-06-29 ENCOUNTER — Other Ambulatory Visit: Payer: Self-pay | Admitting: Neurology

## 2023-06-29 ENCOUNTER — Other Ambulatory Visit: Payer: Self-pay | Admitting: Hematology and Oncology

## 2023-06-29 DIAGNOSIS — G8929 Other chronic pain: Secondary | ICD-10-CM

## 2023-06-29 NOTE — Telephone Encounter (Addendum)
Last seen on 04/21/23 Follow up scheduled on 11/03/23

## 2023-07-14 ENCOUNTER — Inpatient Hospital Stay: Payer: Medicare HMO | Attending: Hematology and Oncology | Admitting: Hematology and Oncology

## 2023-07-14 VITALS — BP 120/74 | HR 87 | Temp 97.8°F | Resp 18 | Ht 62.0 in | Wt 128.2 lb

## 2023-07-14 DIAGNOSIS — Z9011 Acquired absence of right breast and nipple: Secondary | ICD-10-CM | POA: Diagnosis not present

## 2023-07-14 DIAGNOSIS — C50411 Malignant neoplasm of upper-outer quadrant of right female breast: Secondary | ICD-10-CM | POA: Insufficient documentation

## 2023-07-14 DIAGNOSIS — Z79811 Long term (current) use of aromatase inhibitors: Secondary | ICD-10-CM | POA: Insufficient documentation

## 2023-07-14 DIAGNOSIS — Z9221 Personal history of antineoplastic chemotherapy: Secondary | ICD-10-CM | POA: Diagnosis not present

## 2023-07-14 DIAGNOSIS — Z170 Estrogen receptor positive status [ER+]: Secondary | ICD-10-CM | POA: Diagnosis present

## 2023-07-14 NOTE — Assessment & Plan Note (Signed)
11/21/2018:Screening detected right breast mass upper outer quadrant, in addition to areas of calcifications which were not biopsied.  By ultrasound she had 2 breast masses 12 o'clock position 2.9 cm: Grade 2 IDC with high-grade DCIS ER 100%, PR 70%, Ki-67 10%, HER-2 3+ by IHC and 11 o'clock position 1 cm, ER 90%, PR 50%, Ki-67 15%, HER-2 3+ by IHC, T2N0 stage Ib   Neoadjuvant chemotherapy with TCH Perjeta x6 cycles 12/12/2018-03/27/2019   04/30/2019: Right mastectomy: Grade 2 IDC, 3.9 cm, high-grade DCIS, margins are negative, 0/2 lymph nodes negative, ER 95%, PR 80%, HER-2 2+ equivocal, T2 N0   Treatment plan: 1.  Adjuvant Kadcyla started 05/24/2019- 12/12/19 2.  Adjuvant antiestrogen therapy with anastrozole 1 mg p.o. daily x7 years started 05/24/2019  ----------------------------------------------------------------------------------------------------------------------------- Low back pain with pain radiating down the leg: On Percocets 09/24/2019: MRI lumbar spine: Negative for metastatic disease.  Mild degenerative changes without disc protrusion or stenosis 09/24/2019: Bone scan: Nonspecific uptake right 11th rib   Anastrozole toxicities: Denies any hot flashes or myalgias. Shingles: 12/21/19 Completed Valtrex   Breast cancer surveillance: 1. left breast MRI  01/10/2020: No MRI evidence of malignancy in the left breast, status post right mastectomy. 2. breast exam 07/14/2023: Benign 3.  Mammogram left breast  07/09/2022 benign breast density category C   CT chest lung cancer screening 03/11/2023: Negative Brain MRI 02/19/2020: Multiple foci of chronic demyelinating plaque associated with multiple sclerosis   Return to clinic in 1 year for follow-up

## 2023-07-15 ENCOUNTER — Encounter: Payer: Self-pay | Admitting: Hematology and Oncology

## 2023-07-15 ENCOUNTER — Other Ambulatory Visit: Payer: Self-pay | Admitting: Neurology

## 2023-07-15 NOTE — Telephone Encounter (Signed)
Per md orders entered for signatera. Requisition and all supported documents faxed to [phone]. Faxed confirmation was received.

## 2023-07-15 NOTE — Progress Notes (Signed)
Patient Care Team: Chauncey Mann, DO as PCP - General [name], MD (Inactive) as Consulting Physician (General Surgery) Serena Croissant, MD as Consulting Physician (Hematology and Oncology) Dorothy Puffer, MD as Consulting Physician (Radiation Oncology)  DIAGNOSIS:  Encounter Diagnosis  Name Primary?   Malignant neoplasm of upper-outer quadrant of right breast in female, estrogen receptor positive (HCC) Yes    SUMMARY OF ONCOLOGIC HISTORY: Oncology History  Malignant neoplasm of upper-outer quadrant of right breast in female, estrogen receptor positive (HCC)  11/21/2018 Initial Diagnosis   Screening detected right breast mass upper outer quadrant, in addition to areas of calcifications which were not biopsied.  By ultrasound she had 2 breast masses 12 o'clock position 2.9 cm: Grade 2 IDC with high-grade DCIS ER 100%, PR 70%, Ki-67 10%, HER-2 3+ by IHC and 11 o'clock position 1 cm, ER 90%, PR 50%, Ki-67 15%, HER-2 3+ by IHC, T2N0 stage Ib   11/29/2018 Cancer Staging   Staging form: Breast, AJCC 8th Edition - Clinical stage from 11/29/2018: Stage IB (cT2, cN0, cM0, G3, ER+, PR+, HER2+) - Signed by Serena Croissant, MD on 11/29/2018   12/12/2018 -  Neo-Adjuvant Chemotherapy   Neoadjuvant chemotherapy with [facility]   01/18/2019 - 01/20/2019 Hospital Admission   Syncope   04/30/2019 Surgery   Right mastectomy: Grade 2 IDC, 3.9 cm, high-grade DCIS, margins are negative, 0/2 lymph nodes negative, ER 95%, PR 80%, HER-2 2+ equivocal, T2 N0   04/30/2019 Cancer Staging   Staging form: Breast, AJCC 8th Edition - Pathologic stage from 04/30/2019: No Stage Recommended (ypT2, pN0, cM0, G2, ER+, PR+, HER2-) - Signed by Loa Socks, NP on 05/09/2019   05/24/2019 - 12/12/2019 Chemotherapy   Adjuvant Kadcyla   05/24/2019 -  Anti-estrogen oral therapy   Anastrozole 1 mg daily     CHIEF COMPLIANT:   Discussed the use of AI scribe software for clinical note transcription with the patient,  who gave verbal consent to proceed.  History of Present Illness   The patient, a four-year breast cancer survivor, presents for a routine follow-up. They report no changes in weight and overall health. They recently underwent a mammogram, which showed no evidence of cancer and recommended a routine mammogram in one year. The patient is currently on anastrozole and has been experiencing bruising, which they are unsure if it is a side effect of the medication. They deny taking any blood thinners or aspirin, but do take Excedrin and Tylenol. The patient also reports a history of a broken hand last year.  In addition to their cancer history, the patient has been dealing with chronic lower back pain, which was particularly severe on the morning of the visit. They have been managing the pain with tramadol, but are hesitant to increase the dosage despite the pain. They also take gabapentin for their multiple sclerosis (MS).         ALLERGIES:  is allergic to procaine.  MEDICATIONS:  Current Outpatient Medications  Medication Sig Dispense Refill   albuterol (VENTOLIN HFA) 108 (90 Base) MCG/ACT inhaler INHALE 1-2 PUFFS BY MOUTH EVERY 6 HOURS AS NEEDED FOR WHEEZE OR SHORTNESS OF BREATH 18 each 1   anastrozole (ARIMIDEX) 1 MG tablet TAKE 1 TABLET BY MOUTH EVERY DAY 90 tablet 3   aspirin-acetaminophen-caffeine (EXCEDRIN MIGRAINE) 250-250-65 MG tablet Take 2 tablets by mouth every 6 (six) hours as needed for migraine.     carbidopa-levodopa (SINEMET IR) 10-100 MG tablet TAKE 1 TABLET BY MOUTH EVERYDAY AT BEDTIME  90 tablet 0   cyclobenzaprine (FLEXERIL) 5 MG tablet TAKE 1 TABLET BY MOUTH 2 TIMES DAILY AS NEEDED FOR MUSCLE SPASMS. 60 tablet 4   Dimethyl Fumarate 240 MG CPDR One po bid 60 capsule 11   fluticasone (FLONASE) 50 MCG/ACT nasal spray PLACE 2 SPRAYS INTO BOTH NOSTRILS AT BEDTIME. 48 mL 2   gabapentin (NEURONTIN) 300 MG capsule TAKE 2 CAPSULES BY MOUTH 2 TIMES DAILY. 360 capsule 3   lisinopril  (ZESTRIL) 10 MG tablet TAKE 1 TABLET BY MOUTH EVERY DAY 90 tablet 3   Multiple Vitamin (MULTIVITAMIN WITH MINERALS) TABS tablet Take 2 tablets by mouth daily.     omeprazole (PRILOSEC OTC) 20 MG tablet Take 20 mg by mouth daily as needed (acid reflux).     oxybutynin (DITROPAN XL) 10 MG 24 hr tablet Take 1 tablet (10 mg total) by mouth at bedtime. 90 tablet 3   PARoxetine (PAXIL) 20 MG tablet TAKE 1 TABLET BY MOUTH EVERY DAY 90 tablet 1   traMADol (ULTRAM) 50 MG tablet TAKE 1 TABLET BY MOUTH TWICE A DAY AS NEEDED 60 tablet 5   valACYclovir (VALTREX) 1000 MG tablet Take 1 tablet (1,000 mg total) by mouth 2 (two) times daily. (Patient not taking: Reported on 04/21/2023) 14 tablet 0   No current facility-administered medications for this visit.    PHYSICAL EXAMINATION: ECOG PERFORMANCE STATUS: 1 - Symptomatic but completely ambulatory  Vitals:   07/14/23 1352  BP: 120/74  Pulse: 87  Resp: 18  Temp: 97.8 F (36.6 C)  SpO2: 98%   Filed Weights   07/14/23 1352  Weight: 128 lb 3.2 oz (58.2 kg)    Physical Exam   MEASUREMENTS: WT- 128 SKIN: Decreased subcutaneous fat causing excessive bruising      LABORATORY DATA:  I have reviewed the data as listed    Latest Ref Rng & Units 10/19/2022   11:35 AM 04/01/2022    2:59 PM 09/24/2021    1:19 PM  CMP  Glucose 70 - 99 mg/dL 098  97  119   BUN 8 - 27 mg/dL 14  8  10    Creatinine 0.57 - 1.00 mg/dL 1.47  8.29  5.62   Sodium 134 - 144 mmol/L 140  142  138   Potassium 3.5 - 5.2 mmol/L 4.0  3.5  4.1   Chloride 96 - 106 mmol/L 101  103  100   CO2 20 - 29 mmol/L 26  26  29    Calcium 8.7 - 10.3 mg/dL 8.8  8.4  8.8   Total Protein 6.0 - 8.5 g/dL 6.4  6.3  6.6   Total Bilirubin 0.0 - 1.2 mg/dL 0.3  0.3  0.5   Alkaline Phos 44 - 121 IU/L 93  96  90   AST 0 - 40 IU/L 18  20  22    ALT 0 - 32 IU/L 18  20  20      Lab Results  Component Value Date   WBC 4.3 04/21/2023   HGB 13.8 04/21/2023   HCT 40.8 04/21/2023   MCV 90 04/21/2023   PLT  263 04/21/2023   NEUTROABS 2.8 04/21/2023    ASSESSMENT & PLAN:  Malignant neoplasm of upper-outer quadrant of right breast in female, estrogen receptor positive (HCC) 11/21/2018:Screening detected right breast mass upper outer quadrant, in addition to areas of calcifications which were not biopsied.  By ultrasound she had 2 breast masses 12 o'clock position 2.9 cm: Grade 2 IDC with high-grade DCIS ER 100%,  PR 70%, Ki-67 10%, HER-2 3+ by IHC and 11 o'clock position 1 cm, ER 90%, PR 50%, Ki-67 15%, HER-2 3+ by IHC, T2N0 stage Ib   Neoadjuvant chemotherapy with [facility] Perjeta x6 cycles 12/12/2018-03/27/2019   04/30/2019: Right mastectomy: Grade 2 IDC, 3.9 cm, high-grade DCIS, margins are negative, 0/2 lymph nodes negative, ER 95%, PR 80%, HER-2 2+ equivocal, T2 N0   Treatment plan: 1.  Adjuvant Kadcyla started 05/24/2019- 12/12/19 2.  Adjuvant antiestrogen therapy with anastrozole 1 mg p.o. daily x7 years started 05/24/2019  ----------------------------------------------------------------------------------------------------------------------------- Low back pain with pain radiating down the leg: On Percocets 09/24/2019: MRI lumbar spine: Negative for metastatic disease.  Mild degenerative changes without disc protrusion or stenosis 09/24/2019: Bone scan: Nonspecific uptake right 11th rib   Anastrozole toxicities: Denies any hot flashes or myalgias. Shingles: 12/21/19 Completed Valtrex   Breast cancer surveillance: 1. left breast MRI  01/10/2020: No MRI evidence of malignancy in the left breast, status post right mastectomy. 2. breast exam 07/14/2023: Benign 3.  Mammogram left breast  07/09/2022 benign breast density category C   CT chest lung cancer screening 03/11/2023: Negative Brain MRI 02/19/2020: Multiple foci of chronic demyelinating plaque associated with multiple sclerosis   Return to clinic in 1 year for follow-up ------------------------------------- Assessment and Plan    Breast  Cancer Four years post-diagnosis, tolerating Anastrozole well. Recent mammogram shows no evidence of cancer. -Continue Anastrozole as prescribed. -Next mammogram due in one year. -Enroll in [employer] blood test research study for early detection of recurrence.  Chronic Back Pain Likely due to arthritis, exacerbated by weight loss and loss of subcutaneous fat. Pain management with Tramadol and Gabapentin. -Continue current pain management regimen. -Consider consultation with a surgeon for potential therapeutic injections.  Bruising Likely due to loss of subcutaneous fat and use of Excedrin. -Consider strategies to minimize trauma to the skin.  Follow-up in one year, or sooner if any concerns arise.          Orders Placed This Encounter  Procedures   CT CHEST ABDOMEN PELVIS W CONTRAST    Standing Status:   Future    Standing Expiration Date:   07/13/2024    Order Specific Question:   If indicated for the ordered procedure, I authorize the administration of contrast media per Radiology protocol    Answer:   Yes    Order Specific Question:   Does the patient have a contrast media/X-ray dye allergy?    Answer:   No    Order Specific Question:   Preferred imaging location?    Answer:   [facility]    Order Specific Question:   Release to patient    Answer:   Immediate    Order Specific Question:   If indicated for the ordered procedure, I authorize the administration of oral contrast media per Radiology protocol    Answer:   Yes   [employer] Only [facility])    Select as applicable. If patient is on or planning to receive immunotherapy, select drug: Not on Immunotherapy If "Other or Multiple", Write down drug name:  Do not Delete Below This Line   ==========Department Information========== ID: [id number] Department:[facility] [address] [place] [address] Dept: [phone] Dept  Fax: [phone]    Order Specific Question:   Surveillance Program draw frequency:    Answer:   Every 3 Months    Order Specific Question:   Surveillance Program draw count:  Answer:   4    Order Specific Question:   Cancer type:    Answer:   Breast    Order Specific Question:   Stage of diagnosis:    Answer:   I    Order Specific Question:   History of recurrence?    Answer:   No    Order Specific Question:   Current disease status:    Answer:   No evidence of disease    Order Specific Question:   Date of surgery (MM/DD/YYYY):    Answer:   04/30/2019    Order Specific Question:   Tissue collection date (MM/DD/YYYY):    Answer:   04/30/2019    Order Specific Question:   Is the patient receiving or planning to receive immunotherapy?    Answer:   No    Order Specific Question:   Patient status:    Answer:   Outpatient    Order Specific Question:   Most recent progress/clinical note attached?    Answer:   Yes    Order Specific Question:   Pathology report attached?    Answer:   Yes    Order Specific Question:   By placing this electronic order I confirm the testing ordered herein is medically necessary and this patient has been informed of the details of the genetic test(s) ordered, including the risks, benefits, and alternatives, and has consented to testing.    Answer:   Yes    Order Specific Question:   What type of billing?    Answer:   Automatic Data    Order Specific Question:   CC Results    Answer:   Serena Croissant [[id number]]   The patient has a good understanding of the overall plan. she agrees with it. she will call with any problems that may develop before the next visit here. Total time spent: 30 mins including face to face time and time spent for planning, charting and co-ordination of care   Tamsen Meek, MD 07/15/23

## 2023-07-18 ENCOUNTER — Telehealth: Payer: Self-pay

## 2023-07-18 NOTE — Telephone Encounter (Signed)
Patient called and left voicemail on phone room line:  She was wanting to know if Dr Epimenio Foot was able to get information from [facility] regarding Paroxetine capsules?  She is asking for a follow up via mychart if this has been sent in. I could not find anything in the chart related to this. She can also be reached at [phone]

## 2023-07-20 NOTE — Telephone Encounter (Signed)
Pt last seen on 04/21/23 Follow up scheduled on 11/03/23

## 2023-07-21 ENCOUNTER — Other Ambulatory Visit: Payer: Self-pay

## 2023-07-21 DIAGNOSIS — F339 Major depressive disorder, recurrent, unspecified: Secondary | ICD-10-CM

## 2023-07-21 MED ORDER — PAROXETINE HCL 20 MG PO TABS
20.0000 mg | ORAL_TABLET | Freq: Every day | ORAL | 1 refills | Status: DC
Start: 2023-07-21 — End: 2023-11-28

## 2023-08-16 ENCOUNTER — Encounter: Payer: Medicare HMO | Admitting: Student

## 2023-08-19 ENCOUNTER — Telehealth: Payer: Self-pay | Admitting: *Deleted

## 2023-08-19 LAB — SIGNATERA ONLY (NATERA MANAGED)

## 2023-08-19 NOTE — Telephone Encounter (Signed)
Received message from [facility] team that testing can not be preformed due to insufficient tissue sample and testing needing to be canceled.  Pt notified and verbalized understanding.

## 2023-08-31 ENCOUNTER — Other Ambulatory Visit: Payer: Self-pay | Admitting: Neurology

## 2023-08-31 NOTE — Telephone Encounter (Signed)
Last seen on 04/21/23 Follow up scheduled on 11/03/23 Last filled on 07/15/23 #60 tablets (30 day supply) Rx pending to be signed.

## 2023-09-01 ENCOUNTER — Ambulatory Visit: Payer: Medicare HMO | Admitting: Student

## 2023-09-01 VITALS — BP 137/85 | HR 75 | Temp 98.5°F | Ht 62.0 in | Wt 129.7 lb

## 2023-09-01 DIAGNOSIS — G35 Multiple sclerosis: Secondary | ICD-10-CM

## 2023-09-01 DIAGNOSIS — I10 Essential (primary) hypertension: Secondary | ICD-10-CM | POA: Diagnosis not present

## 2023-09-01 DIAGNOSIS — F17210 Nicotine dependence, cigarettes, uncomplicated: Secondary | ICD-10-CM | POA: Diagnosis not present

## 2023-09-01 DIAGNOSIS — Z23 Encounter for immunization: Secondary | ICD-10-CM

## 2023-09-01 DIAGNOSIS — H538 Other visual disturbances: Secondary | ICD-10-CM

## 2023-09-01 DIAGNOSIS — Z0000 Encounter for general adult medical examination without abnormal findings: Secondary | ICD-10-CM

## 2023-09-01 DIAGNOSIS — J449 Chronic obstructive pulmonary disease, unspecified: Secondary | ICD-10-CM

## 2023-09-01 NOTE — Progress Notes (Signed)
Established Patient Office Visit  Subjective   Patient ID: Debbie Watts, female    DOB: 12-21-1960  Age: 62 y.o. MRN: [id number]  Chief Complaint  Patient presents with   Medical Management of Chronic Issues    Leg pain,hand,knees,back elbows    Patient is a 62 yo with a past medical history stated below who presents today for follow-up for HTN, MS, COPD, tobacco use, and healthcare maintenance. Please see problem based assessment and plan for additional details.     Past Medical History:  Diagnosis Date   Acute cystitis without hematuria 03/16/2018   Arthritis    Asthma    Cancer (HCC)    chemo for breast ca  x59mos   Depression    Deviated nasal septum    Gait disturbance 01/12/2018   Hearing loss 06/11/2014   Bilateral   Herniated cervical disc    Hypertension    Leukocytosis 01/19/2019   Migraine    Miscarriage    2   MS (multiple sclerosis) (HCC)    Restless leg syndrome 06/23/2014   Shingles 12/24/2019   Sleep disturbance 07/10/2020   Sore throat 10/29/2021   Syncope 01/19/2019   Urinary tract infection without hematuria 08/10/2018   UTI (urinary tract infection), uncomplicated 06/09/2020   Wrist pain, acute, left 12/23/2021      Review of Systems  Constitutional:  Negative for chills and fever.  Respiratory:  Positive for cough.   Cardiovascular:  Negative for chest pain and leg swelling.  Gastrointestinal:  Negative for abdominal pain, nausea and vomiting.  Genitourinary:  Negative for dysuria.      Objective:     BP 137/85 (BP Location: Right Arm, Patient Position: Sitting, Cuff Size: Small)   Pulse 75   Temp 98.5 F (36.9 C) (Oral)   Ht 5\' 2"  (1.575 m)   Wt 129 lb 11.2 oz (58.8 kg)   SpO2 97%   BMI 23.72 kg/m  BP Readings from Last 3 Encounters:  09/01/23 137/85  07/14/23 120/74  04/21/23 (!) 158/94   Wt Readings from Last 3 Encounters:  09/01/23 129 lb 11.2 oz (58.8 kg)  07/14/23 128 lb 3.2 oz (58.2 kg)  04/21/23 128 lb 14.4 oz  (58.5 kg)     Physical Exam HENT:     Head: Normocephalic and atraumatic.     Nose: Nose normal.     Mouth/Throat:     Mouth: Mucous membranes are moist.  Cardiovascular:     Rate and Rhythm: Normal rate and regular rhythm.     Pulses: Normal pulses.     Heart sounds: Normal heart sounds.  Pulmonary:     Effort: Pulmonary effort is normal.     Breath sounds: Normal breath sounds. No wheezing.  Abdominal:     General: Abdomen is flat. Bowel sounds are normal.     Palpations: Abdomen is soft.  Skin:    General: Skin is warm and dry.  Neurological:     General: No focal deficit present.     Mental Status: She is alert.  Psychiatric:        Mood and Affect: Mood normal.        Behavior: Behavior normal.    Results for orders placed or performed in visit on 09/01/23  CBC no Diff  Result Value Ref Range   WBC 5.1 3.4 - 10.8 x10E3/uL   RBC 4.43 3.77 - 5.28 x10E6/uL   Hemoglobin 13.6 11.1 - 15.9 g/dL   Hematocrit 09.3 23.5 -  46.6 %   MCV 95 79 - 97 fL   MCH 30.7 26.6 - 33.0 pg   MCHC 32.3 31.5 - 35.7 g/dL   RDW 40.9 81.1 - 91.4 %   Platelets 233 150 - 450 x10E3/uL  CMP14 + Anion Gap  Result Value Ref Range   Glucose 85 70 - 99 mg/dL   BUN 9 8 - 27 mg/dL   Creatinine, Ser 7.82 0.57 - 1.00 mg/dL   eGFR 956 >21 HY/QMV/7.84   BUN/Creatinine Ratio 14 12 - 28   Sodium 139 134 - 144 mmol/L   Potassium 4.5 3.5 - 5.2 mmol/L   Chloride 100 96 - 106 mmol/L   CO2 25 20 - 29 mmol/L   Anion Gap 14.0 10.0 - 18.0 mmol/L   Calcium 8.7 8.7 - 10.3 mg/dL   Total Protein 6.3 6.0 - 8.5 g/dL   Albumin 4.3 3.9 - 4.9 g/dL   Globulin, Total 2.0 1.5 - 4.5 g/dL   Bilirubin Total 0.3 0.0 - 1.2 mg/dL   Alkaline Phosphatase 91 44 - 121 IU/L   AST 21 0 - 40 IU/L   ALT 22 0 - 32 IU/L    The ASCVD Risk score (Arnett DK, et al., 2019) failed to calculate for the following reasons:   Cannot find a previous HDL lab   Cannot find a previous total cholesterol lab    Assessment & Plan:    Problem List Items Addressed This Visit     Multiple sclerosis (HCC) (Chronic)    Patient continues to experience leg pain, knee pain, and upper extremity pain. She was referred to physical therapy April 2024. She states the sessions (3-4) didn't do much to improve her pain, is no longer going to sessions. Follows with neurology. For pain, she takes tramadol 50 mg BID, gabapentin 600 mg total in the morning and 600 mg total at bedtime, and Tylenol and flexeril 5 mg as needed. She also takes oxybutynin 10 mg at bedtime for overactive bladder, states it is helpful.  - Continue tramadol 50 mg BID as needed, gabapentin 600 mg BID, Tylenol PRN, and Flexeril 5 mg as needed - Continue oxybutynin 10 mg daily at bedtime      Chronic obstructive pulmonary disease (HCC)    Patient endorses occasional SOB and intermittent coughing throughout the day. States she tries to get phlegm out when coughing but not much comes out. Denies fevers or home O2 use. Has been using her boyfriend's albuterol inhaler because it is too expensive with her insurance (Medicare). CBC today unremarkable. On physical exam bilateral lung sounds clear and normal respiratory effort on room air. Discussed adding a daily inhaler and sending to [facility] pharmacy. Will also follow up with pharmacy to help determine most cost efficient treatment of PRN and daily inhaler.  - Continue albuterol inhaler PRN (sent in Rx to [facility] pharmacy) - Start Spiriva inhaler use, 2 puffs daily       Relevant Medications   albuterol (VENTOLIN HFA) 108 (90 Base) MCG/ACT inhaler   Tiotropium Bromide Monohydrate (SPIRIVA RESPIMAT) 2.5 MCG/ACT AERS   Cigarette nicotine dependence without complication (Chronic)    Patient continues to endorse tobacco use. States she is smoking 3/4ths of a pack a day. Would like to quit eventually but states her boyfriend is a smoker and finds it difficult to cut back when they are both smoking. Discussed Chantix as a  way to help support her quitting. Patient expressed interest, will let us know when  she is ready to begin Chantix.  - Follow up on smoking cessation at 3 month follow up appointment      Essential hypertension (Chronic)    Patient's blood pressure 137/85. Taking lisinopril 10 mg daily, tolerating well. Denies any CP, heart palpitations, headache, or swelling today. CMP today unremarkable.   - Continue lisinopril 10 mg daily       Relevant Orders   CBC no Diff (Completed)   CMP14 + Anion Gap (Completed)   Healthcare maintenance (Chronic)    Patient's pap smear is up to date, next due 2027. Mammogram and low dose CT are also up to date for this year. Patient did not receive FIT test kit, will get one today. She states she went to get an updated glasses prescription and was told she may have cataracts. Patient to follow up with ophthalmology. Will get flu vaccine today.  - Placed referral to ophthalmology - Patient to return FIT test kit to lab  - Flu vaccine today       Other Visit Diagnoses     Vision blurring    -  Primary   Relevant Orders   Ambulatory referral to Ophthalmology   Encounter for immunization       Relevant Orders   Flu vaccine trivalent PF, 6mos and older(Flulaval,Afluria,Fluarix,Fluzone) (Completed)      Return in about 3 months (around 12/02/2023) for COPD management, tobacco cessation .  Patient seen with Dr. Criselda Peaches.   Bernardette Waldron Colbert Coyer, MD

## 2023-09-01 NOTE — Patient Instructions (Signed)
Thank you, Ms.Ezekiel Slocumb for allowing [place] to provide your care today. Today we discussed your HTN, medications, tobacco cessation, and health maintenance.     I have ordered the following labs for you:  Lab Orders         CBC no Diff         CMP14 + Anion Gap      Tests ordered today:  - none  Referrals ordered today:   Referral Orders         Ambulatory referral to Ophthalmology      I have ordered the following medication/changed the following medications:   Stop the following medications: There are no discontinued medications.   Start the following medications: No orders of the defined types were placed in this encounter.    Follow up: 3 months    Remember:   - I will call you with the results of your labs today.   - I will try to send your inhalers to the community pharmacy on [address]. If I need to change the pharmacy I will call you and let you know, otherwise they will notify you that your refills are ready.   - I placed a referral for the eye doctor (ophthalmology), please let us know if you haven't received a call in 1-2 weeks.   - You will get a FIT/cologuard test kit today. Please follow directions and bring back, I will call you with those results when they are ready.   Should you have any questions or concerns please call the internal medicine clinic at [phone].     Garyn Waguespack Colbert Coyer, MD PGY-1 Internal Medicine Teaching Prog[facility]

## 2023-09-02 ENCOUNTER — Other Ambulatory Visit (HOSPITAL_COMMUNITY): Payer: Self-pay

## 2023-09-02 ENCOUNTER — Other Ambulatory Visit: Payer: Self-pay | Admitting: Neurology

## 2023-09-02 LAB — CMP14 + ANION GAP
ALT: 22 [IU]/L (ref 0–32)
AST: 21 [IU]/L (ref 0–40)
Albumin: 4.3 g/dL (ref 3.9–4.9)
Alkaline Phosphatase: 91 [IU]/L (ref 44–121)
Anion Gap: 14 mmol/L (ref 10.0–18.0)
BUN/Creatinine Ratio: 14 (ref 12–28)
BUN: 9 mg/dL (ref 8–27)
Bilirubin Total: 0.3 mg/dL (ref 0.0–1.2)
CO2: 25 mmol/L (ref 20–29)
Calcium: 8.7 mg/dL (ref 8.7–10.3)
Chloride: 100 mmol/L (ref 96–106)
Creatinine, Ser: 0.65 mg/dL (ref 0.57–1.00)
Globulin, Total: 2 g/dL (ref 1.5–4.5)
Glucose: 85 mg/dL (ref 70–99)
Potassium: 4.5 mmol/L (ref 3.5–5.2)
Sodium: 139 mmol/L (ref 134–144)
Total Protein: 6.3 g/dL (ref 6.0–8.5)
eGFR: 100 mL/min/{1.73_m2} (ref >59)

## 2023-09-02 LAB — CBC
Hematocrit: 42.1 % (ref 34.0–46.6)
Hemoglobin: 13.6 g/dL (ref 11.1–15.9)
MCH: 30.7 pg (ref 26.6–33.0)
MCHC: 32.3 g/dL (ref 31.5–35.7)
MCV: 95 fL (ref 79–97)
Platelets: 233 10*3/uL (ref 150–450)
RBC: 4.43 x10E6/uL (ref 3.77–5.28)
RDW: 13.1 % (ref 11.7–15.4)
WBC: 5.1 10*3/uL (ref 3.4–10.8)

## 2023-09-02 MED ORDER — ALBUTEROL SULFATE HFA 108 (90 BASE) MCG/ACT IN AERS
1.0000 | INHALATION_SPRAY | Freq: Four times a day (QID) | RESPIRATORY_TRACT | 1 refills | Status: DC | PRN
  Filled 2023-09-02: qty 18, 25d supply, fill #0

## 2023-09-02 MED ORDER — SPIRIVA RESPIMAT 2.5 MCG/ACT IN AERS
2.0000 | INHALATION_SPRAY | Freq: Every day | RESPIRATORY_TRACT | 3 refills | Status: AC
  Filled 2023-09-02: qty 4, 25d supply, fill #0

## 2023-09-02 NOTE — Assessment & Plan Note (Signed)
Patient's blood pressure 137/85. Taking lisinopril 10 mg daily, tolerating well. Denies any CP, heart palpitations, headache, or swelling today. CMP today unremarkable.   - Continue lisinopril 10 mg daily

## 2023-09-02 NOTE — Assessment & Plan Note (Addendum)
Patient continues to endorse tobacco use. States she is smoking 3/4ths of a pack a day. Would like to quit eventually but states her boyfriend is a smoker and finds it difficult to cut back when they are both smoking. Discussed Chantix as a way to help support her quitting. Patient expressed interest, will let us know when she is ready to begin Chantix.  - Follow up on smoking cessation at 3 month follow up appointment

## 2023-09-02 NOTE — Assessment & Plan Note (Signed)
Patient continues to experience leg pain, knee pain, and upper extremity pain. She was referred to physical therapy April 2024. She states the sessions (3-4) didn't do much to improve her pain, is no longer going to sessions. Follows with neurology. For pain, she takes tramadol 50 mg BID, gabapentin 600 mg total in the morning and 600 mg total at bedtime, and Tylenol and flexeril 5 mg as needed. She also takes oxybutynin 10 mg at bedtime for overactive bladder, states it is helpful.  - Continue tramadol 50 mg BID as needed, gabapentin 600 mg BID, Tylenol PRN, and Flexeril 5 mg as needed - Continue oxybutynin 10 mg daily at bedtime

## 2023-09-02 NOTE — Assessment & Plan Note (Addendum)
Patient endorses occasional SOB and intermittent coughing throughout the day. States she tries to get phlegm out when coughing but not much comes out. Denies fevers or home O2 use. Has been using her boyfriend's albuterol inhaler because it is too expensive with her insurance (Medicare). CBC today unremarkable. On physical exam bilateral lung sounds clear and normal respiratory effort on room air. Discussed adding a daily inhaler and sending to [facility] pharmacy. Will also follow up with pharmacy to help determine most cost efficient treatment of PRN and daily inhaler.  - Continue albuterol inhaler PRN (sent in Rx to [facility] pharmacy) - Start Spiriva inhaler use, 2 puffs daily

## 2023-09-02 NOTE — Assessment & Plan Note (Signed)
Patient's pap smear is up to date, next due 2027. Mammogram and low dose CT are also up to date for this year. Patient did not receive FIT test kit, will get one today. She states she went to get an updated glasses prescription and was told she may have cataracts. Patient to follow up with ophthalmology. Will get flu vaccine today.  - Placed referral to ophthalmology - Patient to return FIT test kit to lab  - Flu vaccine today

## 2023-09-05 ENCOUNTER — Other Ambulatory Visit (HOSPITAL_COMMUNITY): Payer: Self-pay

## 2023-09-05 ENCOUNTER — Encounter: Payer: Self-pay | Admitting: Hematology and Oncology

## 2023-09-05 ENCOUNTER — Telehealth: Payer: Self-pay

## 2023-09-05 ENCOUNTER — Other Ambulatory Visit: Payer: Self-pay

## 2023-09-05 MED ORDER — CARBIDOPA-LEVODOPA 10-100 MG PO TABS: ORAL_TABLET | ORAL | 0 refills | Status: DC

## 2023-09-05 NOTE — Telephone Encounter (Signed)
Pt called  stated that her inhalers ( Tiotropium Bromide Monohydrate (SPIRIVA RESPIMAT) 2.5 MCG/ACT AERS / albuterol (VENTOLIN HFA) 108 (90 Base) MCG/ACT inhaler ) is $75 each and she can't afford to get them .Marland Kitchen She stated that the Dr told her there is a program where she can get them for $10 ... I explained  the  IM program is for uninsured  patients and because  she has insurance  she does not  qualify for the program that is why the the med is 75 her co pay

## 2023-09-05 NOTE — Telephone Encounter (Signed)
Last seen on 04/21/23 per note " She is on Sinemet at bedtme only for RLS " Follow up scheduled on 11/03/23 Last filled on 04/27/23 #90 tablets (90 day supply)

## 2023-09-07 NOTE — Progress Notes (Signed)
 Internal Medicine Clinic Attending  I was physically present during the key portions of the resident provided service and participated in the medical decision making of patient's management care. I reviewed pertinent patient test results.  The assessment, diagnosis, and plan were formulated together and I agree with the documentation in the resident's note.  Inez Catalina, MD

## 2023-09-14 ENCOUNTER — Other Ambulatory Visit: Payer: Self-pay

## 2023-10-13 ENCOUNTER — Other Ambulatory Visit: Payer: Self-pay | Admitting: Neurology

## 2023-10-13 DIAGNOSIS — G35 Multiple sclerosis: Secondary | ICD-10-CM

## 2023-10-27 ENCOUNTER — Encounter: Payer: Self-pay | Admitting: Hematology and Oncology

## 2023-11-03 ENCOUNTER — Ambulatory Visit: Payer: Medicare Other | Admitting: Neurology

## 2023-11-08 ENCOUNTER — Encounter: Payer: Self-pay | Admitting: Hematology and Oncology

## 2023-11-12 ENCOUNTER — Other Ambulatory Visit: Payer: Self-pay | Admitting: Internal Medicine

## 2023-11-15 ENCOUNTER — Ambulatory Visit (HOSPITAL_COMMUNITY)
Admission: RE | Admit: 2023-11-15 | Discharge: 2023-11-15 | Disposition: A | Discharge status: Home / Self Care | Payer: Medicare Other | Source: Ambulatory Visit | Attending: Hematology and Oncology | Admitting: Hematology and Oncology

## 2023-11-15 DIAGNOSIS — K7689 Other specified diseases of liver: Secondary | ICD-10-CM | POA: Diagnosis not present

## 2023-11-15 DIAGNOSIS — Z853 Personal history of malignant neoplasm of breast: Secondary | ICD-10-CM | POA: Diagnosis not present

## 2023-11-15 DIAGNOSIS — I700 Atherosclerosis of aorta: Secondary | ICD-10-CM | POA: Diagnosis not present

## 2023-11-15 DIAGNOSIS — K769 Liver disease, unspecified: Secondary | ICD-10-CM | POA: Diagnosis not present

## 2023-11-15 DIAGNOSIS — Z170 Estrogen receptor positive status [ER+]: Secondary | ICD-10-CM | POA: Diagnosis not present

## 2023-11-15 DIAGNOSIS — C50411 Malignant neoplasm of upper-outer quadrant of right female breast: Secondary | ICD-10-CM | POA: Diagnosis not present

## 2023-11-15 LAB — POCT I-STAT CREATININE: Creatinine, Ser: 0.9 mg/dL (ref 0.44–1.00)

## 2023-11-15 MED ORDER — IOHEXOL 300 MG/ML  SOLN
100.0000 mL | Freq: Once | INTRAMUSCULAR | Status: AC | PRN
  Administered 2023-11-15: 100 mL via INTRAVENOUS

## 2023-11-15 NOTE — Telephone Encounter (Signed)
Medication sent to pharmacy  

## 2023-11-16 ENCOUNTER — Telehealth: Payer: Self-pay | Admitting: Hematology and Oncology

## 2023-11-16 NOTE — Telephone Encounter (Signed)
I informed her the results of the CT scan which did not show any evidence of metastatic disease.

## 2023-11-25 ENCOUNTER — Other Ambulatory Visit: Payer: Self-pay | Admitting: Internal Medicine

## 2023-11-25 DIAGNOSIS — F339 Major depressive disorder, recurrent, unspecified: Secondary | ICD-10-CM

## 2023-12-22 ENCOUNTER — Other Ambulatory Visit: Payer: Self-pay | Admitting: Neurology

## 2023-12-22 DIAGNOSIS — G8929 Other chronic pain: Secondary | ICD-10-CM

## 2024-01-09 ENCOUNTER — Other Ambulatory Visit

## 2024-01-09 DIAGNOSIS — Z0000 Encounter for general adult medical examination without abnormal findings: Secondary | ICD-10-CM

## 2024-01-11 LAB — FECAL OCCULT BLOOD, IMMUNOCHEMICAL: Fecal Occult Bld: NEGATIVE

## 2024-01-12 ENCOUNTER — Encounter: Payer: Self-pay | Admitting: Internal Medicine

## 2024-01-12 NOTE — Progress Notes (Signed)
 Sent MyChart message about normal results.

## 2024-01-23 ENCOUNTER — Encounter: Payer: Self-pay | Admitting: Neurology

## 2024-01-23 ENCOUNTER — Ambulatory Visit: Payer: Medicare Other | Admitting: Neurology

## 2024-01-23 VITALS — BP 137/92 | HR 92 | Ht 62.0 in | Wt 122.0 lb

## 2024-01-23 DIAGNOSIS — Z170 Estrogen receptor positive status [ER+]: Secondary | ICD-10-CM

## 2024-01-23 DIAGNOSIS — C50411 Malignant neoplasm of upper-outer quadrant of right female breast: Secondary | ICD-10-CM

## 2024-01-23 DIAGNOSIS — G35 Multiple sclerosis: Secondary | ICD-10-CM

## 2024-01-23 DIAGNOSIS — R202 Paresthesia of skin: Secondary | ICD-10-CM

## 2024-01-23 DIAGNOSIS — R269 Unspecified abnormalities of gait and mobility: Secondary | ICD-10-CM | POA: Diagnosis not present

## 2024-01-23 DIAGNOSIS — Z79899 Other long term (current) drug therapy: Secondary | ICD-10-CM | POA: Diagnosis not present

## 2024-01-23 MED ORDER — TRAMADOL HCL 50 MG PO TABS: 50.0000 mg | ORAL_TABLET | Freq: Two times a day (BID) | ORAL | 5 refills | Status: DC | PRN

## 2024-01-23 MED ORDER — DIMETHYL FUMARATE 240 MG PO CPDR: DELAYED_RELEASE_CAPSULE | ORAL | 11 refills | Status: DC

## 2024-01-23 MED ORDER — OXYBUTYNIN CHLORIDE ER 10 MG PO TB24: 10.0000 mg | ORAL_TABLET | Freq: Every day | ORAL | 3 refills | Status: DC

## 2024-01-23 NOTE — Progress Notes (Signed)
 [facility]  PATIENT: Debbie Watts DOB: 1961/01/19    HISTORICAL  CHIEF COMPLAINT:  Chief Complaint  Patient presents with   Follow-up    Pt in room 10. Alone. Here for MS follow up. Pt on Tecfidera. Pt reports doing well, no recent falls. No concerns. Pt work up with back pain, took ultram.     HISTORY OF PRESENT ILLNESS:  Debbie Watts is a 63 y.o. woman with relapsing remitting multiple sclerosis.  Update 01/13/62 She is on DMF and tolerates it well (gets from [facility]).    Last lymphocyte count was 0.6 and we reduced her dose.    She feels gait is stable.   She does a little worse in heat.   She can walk a mile but then needs a rest due to LBP, COPD and fatigue     As she walks longer, her back hurts more.    Her right leg is weaker than her left and more clumsy.   Arms are more symmetric.   She has dysesthesias in her legs/feet.      She has urinary urgency and rare urge incontinence  Vision is fine.        She also has LBP, worse if active or if she stands up 20 minutes.  Increases when rises from a chair, bed or a floor.  Lumbar MRI 2020 showed DJD but no spinal stenosis.    Headaches are much better the past couple years.     She takes Excedron migraine when one occurs.  She has frequent headaches arising form the left occiput.   Mood is doing better.   She notes some fatigue.  She sleeps ok most nights but some sleep maintenance insomnia.   She is on Sinemet at bedtme only for RLS.     She was diagnosed with breast cancer January 2020.   She did TCHP (docetaxel, carboplatin, trastuzumab, pertuzumab (Perjeta)).   She has had 5 cycles of chemotherapy.   She is on anastrozole now.    She sees  Dr. Pamelia Hoit twice a year.      MS history:  She was diagnosed with MS in 1999 based on MRI and lumbar rupture.  At that time she was living in [place] and was seeing Dr. Gertha Calkin.  When he retired she saw another doctor and then she moved to this area around  2015 but did not seek neurologic referral until 2015.  She was on Avonex for many years but stopped due to insurance.  Then, in November 2016, she started in the drug study and was on ALKS 8700 4 2 years.  The study ended in late 2018 for her and she switched over to Tecfidera.      Images: MRI brain showed multiple T2/FLAIR hyperintense foci in the hemispheres in a pattern and configuration consistent with chronic demyelinating plaque associated with multiple sclerosis.  None of the foci appear to be acute.  They do not enhance.  Compared to the MRI dated 08/09/2017, there are no new lesions.  MRI cervical spine showed 3 T2 hyperintense foci within the spinal cord adjacent to C2, C4-C5 and C6-C7.  All of these were present on the 2015 MRI.  They do not enhance.  They are consistent with chronic demyelinating plaque associated with multiple sclerosis.   Moderate spinal stenosis at C5-C6 and mild spinal stenosis at C4-C5 and C6-C7.  There does not appear to be any nerve root compression at these levels.  Degenerative changes  at C5-C6 have mildly progressed compared to the 2015 MRI.  MRI brain 02/19/2020  T2/FLAIR hyperintense foci in the hemispheres in a pattern and configuration consistent with chronic demyelinating plaque associated with multiple sclerosis.  None of the foci appear to be acute.  They do not enhance.  Compared to the MRI dated 08/09/2017, there are no new lesions  MRI lumbar 09/24/2019 showed  mild DJD but no spinal stenosis.  No   REVIEW OF SYSTEMS: Constitutional: No fevers, chills, sweats, or change in appetite.  She has fatigue.  She sometimes has insomnia. Eyes: No visual changes, double vision, eye pain Ear, nose and throat: No hearing loss, ear pain, nasal congestion, sore throat Cardiovascular: No chest pain, palpitations Respiratory:  No shortness of breath at rest or with exertion.   No wheezes GastrointestinaI: No nausea, vomiting, diarrhea, abdominal pain, fecal  incontinence Genitourinary: She has urinary frequency and nocturia . Musculoskeletal:  No neck pain, back pain Integumentary: No rash, pruritus, skin lesions Neurological: as above Psychiatric: No depression at this time.  No anxiety Endocrine: No palpitations, diaphoresis, change in appetite, change in weigh or increased thirst Hematologic/Lymphatic:  No anemia, purpura, petechiae. Allergic/Immunologic: No itchy/runny eyes, nasal congestion, recent allergic reactions, rashes  ALLERGIES: Allergies  Allergen Reactions   Procaine Shortness Of Breath    HOME MEDICATIONS:  Current Outpatient Medications:    albuterol (VENTOLIN HFA) 108 (90 Base) MCG/ACT inhaler, INHALE 1-2 PUFFS BY MOUTH EVERY 6 HOURS AS NEEDED FOR WHEEZE OR SHORTNESS OF BREATH, Disp: 18 g, Rfl: 1   anastrozole (ARIMIDEX) 1 MG tablet, TAKE 1 TABLET BY MOUTH EVERY DAY, Disp: 90 tablet, Rfl: 3   aspirin-acetaminophen-caffeine (EXCEDRIN MIGRAINE) 250-250-65 MG tablet, Take 2 tablets by mouth every 6 (six) hours as needed for migraine., Disp: , Rfl:    carbidopa-levodopa (SINEMET IR) 10-100 MG tablet, TAKE 1 TABLET BY MOUTH EVERYDAY AT BEDTIME, Disp: 90 tablet, Rfl: 0   cyclobenzaprine (FLEXERIL) 5 MG tablet, TAKE 1 TABLET BY MOUTH 2 TIMES DAILY AS NEEDED FOR MUSCLE SPASMS., Disp: 60 tablet, Rfl: 1   fluticasone (FLONASE) 50 MCG/ACT nasal spray, PLACE 2 SPRAYS INTO BOTH NOSTRILS AT BEDTIME., Disp: 48 mL, Rfl: 2   gabapentin (NEURONTIN) 300 MG capsule, TAKE 2 CAPSULES BY MOUTH TWICE A DAY, Disp: 360 capsule, Rfl: 3   lisinopril (ZESTRIL) 10 MG tablet, TAKE 1 TABLET BY MOUTH EVERY DAY, Disp: 90 tablet, Rfl: 3   Multiple Vitamin (MULTIVITAMIN WITH MINERALS) TABS tablet, Take 2 tablets by mouth daily., Disp: , Rfl:    omeprazole (PRILOSEC OTC) 20 MG tablet, Take 20 mg by mouth daily as needed (acid reflux)., Disp: , Rfl:    PARoxetine (PAXIL) 20 MG tablet, TAKE 1 TABLET BY MOUTH EVERY DAY, Disp: 90 tablet, Rfl: 1   Tiotropium  Bromide Monohydrate (SPIRIVA RESPIMAT) 2.5 MCG/ACT AERS, Inhale 2 puffs into the lungs daily., Disp: 4 g, Rfl: 3   valACYclovir (VALTREX) 1000 MG tablet, Take 1 tablet (1,000 mg total) by mouth 2 (two) times daily., Disp: 14 tablet, Rfl: 0   Dimethyl Fumarate 240 MG CPDR, One po bid, Disp: 60 capsule, Rfl: 11   oxybutynin (DITROPAN XL) 10 MG 24 hr tablet, Take 1 tablet (10 mg total) by mouth at bedtime., Disp: 90 tablet, Rfl: 3   traMADol (ULTRAM) 50 MG tablet, Take 1 tablet (50 mg total) by mouth 2 (two) times daily as needed., Disp: 60 tablet, Rfl: 5  PAST MEDICAL HISTORY: Past Medical History:  Diagnosis Date   Acute  cystitis without hematuria 03/16/2018   Arthritis    Asthma    Cancer (HCC)    chemo for breast ca  [id number]   Depression    Deviated nasal septum    Gait disturbance 01/12/2018   Hearing loss 06/11/2014   Bilateral   Herniated cervical disc    Hypertension    Leukocytosis 01/19/2019   Migraine    Miscarriage    2   MS (multiple sclerosis) (HCC)    Restless leg syndrome 06/23/2014   Shingles 12/24/2019   Sleep disturbance 07/10/2020   Sore throat 10/29/2021   Syncope 01/19/2019   Urinary tract infection without hematuria 08/10/2018   UTI (urinary tract infection), uncomplicated 06/09/2020   Wrist pain, acute, left 12/23/2021    PAST SURGICAL HISTORY: Past Surgical History:  Procedure Laterality Date   BREAST BIOPSY Right 11/21/2018   Malignant   HEMATOMA EVACUATION Right 05/01/2019   Procedure: EVACUATION HEMATOMA;  Surgeon: Emelia Loron, MD;  Location: [facility] OR;  Service: General;  Laterality: Right;   MASTECTOMY Right 04/30/2019   MASTECTOMY W/ SENTINEL NODE BIOPSY Right 04/30/2019   Procedure: RIGHT MASTECTOMY WITH  RIGHT AXILLARY SENTINEL LYMPH NODE BIOPSY INJECT BLUE DYE;  Surgeon: Emelia Loron, MD;  Location: [facility] OR;  Service: General;  Laterality: Right;   NASAL SEPTUM SURGERY     SIMPLE MASTECTOMY WITH AXILLARY SENTINEL NODE BIOPSY Right  04/30/2019   spinal injections      FAMILY HISTORY: Family History  Adopted: Yes  Problem Relation Age of Onset   Cancer Maternal Uncle        throat    SOCIAL HISTORY:  Social History   Socioeconomic History   Marital status: Single    Spouse name: Not on file   Number of children: 2   Years of education: 12   Highest education level: Not on file  Occupational History   Occupation: Disabled  Tobacco Use   Smoking status: Every Day    Current packs/day: 1.00    Average packs/day: 1 pack/day for 44.0 years (44.0 ttl pk-yrs)    Types: Cigarettes   Smokeless tobacco: Never   Tobacco comments:    1 pk per day  Vaping Use   Vaping status: Former  Substance and Sexual Activity   Alcohol use: Yes    Comment: 0-5 beers daily   Drug use: Yes    Types: Marijuana   Sexual activity: Not Currently    Partners: Male  Other Topics Concern   Not on file  Social History Narrative   Patient is single with 2 children.   Patient is right handed.   Current Social History 08/10/2018        Right handed       Patient lives with significant other Salvatore Decent) in one level Townhome 08/10/2018    Transportation: Patient has own vehicle  08/10/2018   Important Relationships Salvatore Decent 08/10/2018    Pets: one dog named Browser 08/10/2018   Education / Work:  12th grade/ Disabled 08/10/2018   Interests / Fun: Watching baseball and Nascar 08/10/2018   Current Stressors: Significant other is depressed over his poor health 08/10/2018   Religious / Personal Beliefs: "I believe in God" 08/10/2018   Other: "I had a miscarriage before I had my 2 sons." 08/10/2018   L. Leward Quan, RN, BSN  Social Drivers of Corporate investment banker Strain: Low Risk  (01/26/2023)   Overall Financial Resource Strain (CARDIA)    Difficulty of Paying Living Expenses: Not very hard  Food Insecurity: Low Risk   (07/21/2023)   Received from [facility]   Hunger Vital Sign    Worried About Running Out of Food in the Last Year: Never true    Ran Out of Food in the Last Year: Never true  Transportation Needs: No Transportation Needs (07/21/2023)   Received from [facility]    In the past 12 months, has lack of reliable transportation kept you from medical appointments, meetings, work or from getting things needed for daily living? : No  Physical Activity: Inactive (01/26/2023)   Exercise Vital Sign    Days of Exercise per Week: 0 days    Minutes of Exercise per Session: 0 min  Stress: Stress Concern Present (01/26/2023)   [employer] of Occupational Health - Occupational Stress Questionnaire    Feeling of Stress : To some extent  Social Connections: Moderately Isolated (01/26/2023)   Social Connection and Isolation Panel [NHANES]    Frequency of Communication with Friends and Family: Once a week    Frequency of Social Gatherings with Friends and Family: Never    Attends Religious Services: More than 4 times per year    Active Member of [employer] or Organizations: No    Attends Banker Meetings: Never    Marital Status: Living with partner  Intimate Partner Violence: Not At Risk (01/26/2023)   Humiliation, Afraid, Rape, and Kick questionnaire    Fear of Current or Ex-Partner: No    Emotionally Abused: No    Physically Abused: No    Sexually Abused: No     PHYSICAL EXAM  Vitals:   01/23/24 1404  BP: (!) 137/92  Pulse: 92  Weight: 122 lb (55.3 kg)  Height: 5\' 2"  (1.575 m)    Body mass index is 22.31 kg/m.   General: The patient is well-developed and well-nourished and in no acute distress.   She has tenderness in the left occiput radiating up her skull with deep palpation.  Neurologic Exam  Mental status: The patient is alert and oriented x 3 at the time of the examination. The patient has apparent normal recent and remote memory, with an apparently  normal attention span and concentration ability.   Speech is normal.  Cranial nerves: Extraocular movements are full.  Facial strength and sensation was normal.  The trapezius strength was normal bilaterally.  The tongue is midline, and the patient has symmetric elevation of the soft palate. Bilateral hearing deficits are noted.  Motor:  Muscle bulk is normal.   Tone is slightly increased in legs. Strength is  5 / 5 in all 4 extremities.   Sensory: She had slight asymmetry of sensation, reduced on the left to touch and vibration  Coordination: Cerebellar testing shows good finger-nose-finger.  Heel-to-shin is mildly reduced bilaterally.  Gait and station: Station is normal. The gait is spastic and mildly wide.   Tandem is very poor.  Romberg is negative.  Reflexes: Deep tendon reflexes are symmetric and normal in arms, but increased in legs but no clonus at ankles.         DIAGNOSTIC DATA (LABS, IMAGING, TESTING) - I reviewed patient records, labs, notes, testing and imaging myself where available.  Lab Results  Component Value Date   WBC 5.1 09/01/2023   HGB 13.6 09/01/2023  HCT 42.1 09/01/2023   MCV 95 09/01/2023   PLT 233 09/01/2023      Component Value Date/Time   NA 139 09/01/2023 1429   K 4.5 09/01/2023 1429   CL 100 09/01/2023 1429   CO2 25 09/01/2023 1429   GLUCOSE 85 09/01/2023 1429   GLUCOSE 93 12/12/2019 1330   BUN 9 09/01/2023 1429   CREATININE 0.90 11/15/2023 1711   CREATININE 0.68 12/12/2019 1330   CALCIUM 8.7 09/01/2023 1429   PROT 6.3 09/01/2023 1429   ALBUMIN 4.3 09/01/2023 1429   AST 21 09/01/2023 1429   AST 35 12/12/2019 1330   ALT 22 09/01/2023 1429   ALT 44 12/12/2019 1330   ALKPHOS 91 09/01/2023 1429   BILITOT 0.3 09/01/2023 1429   BILITOT 0.4 12/12/2019 1330   GFRNONAA 97 06/02/2020 1422   GFRNONAA >60 12/12/2019 1330   GFRAA 112 06/02/2020 1422   GFRAA >60 12/12/2019 1330   Lab Results  Component Value Date   CHOL 127 06/02/2020   HDL  55 06/02/2020   LDLCALC 56 06/02/2020   TRIG 82 06/02/2020   CHOLHDL 2.3 06/02/2020   Lab Results  Component Value Date   HGBA1C 5.3 12/07/2017   ________________________________________________   Multiple sclerosis (HCC) - Plan: CBC with Differential/Platelet  High risk medication use - Plan: CBC with Differential/Platelet  Gait disturbance  Malignant neoplasm of upper-outer quadrant of right breast in female, estrogen receptor positive (HCC)  Paresthesias   1.   Continue/Refill Tecfidera.   Check CBC/Diff.   If lymphocyte counts low we might change to 120 mg po bid would need to consider changing to a different medication (such as Aubagio) we will need to take her history of breast cancer into account if we make a change in therapy. 2.   Continue Flexeril.  Tylenol as needed pain 3.   Stay active and exercise as tolerated.   Gait is off balanced and advised to take additional caution on uneven surfaces 4.   Renew Oxybutynin for bladder 5.    rtc in 6 months or sooner if new or worsening issues.  This visit is part of a comprehensive longitudinal care medical relationship regarding the patients primary diagnosis of MS and related concerns.   Teighan Aubert A. Epimenio Foot, MD, [facility] 01/23/2024, 2:50 PM Certified in Neurology, Clinical Neurophysiology, Sleep Medicine, Pain Medicine and Neuroimaging  [facility] [address] [place], [place] [address] [phone]

## 2024-01-24 ENCOUNTER — Encounter: Payer: Self-pay | Admitting: Neurology

## 2024-01-24 LAB — CBC WITH DIFFERENTIAL/PLATELET
Basophils Absolute: 0.1 10*3/uL (ref 0.0–0.2)
Basos: 2 %
EOS (ABSOLUTE): 0.1 10*3/uL (ref 0.0–0.4)
Eos: 2 %
Hematocrit: 40.2 % (ref 34.0–46.6)
Hemoglobin: 13.7 g/dL (ref 11.1–15.9)
Immature Grans (Abs): 0 10*3/uL (ref 0.0–0.1)
Immature Granulocytes: 0 %
Lymphocytes Absolute: 0.8 10*3/uL (ref 0.7–3.1)
Lymphs: 18 %
MCH: 31.9 pg (ref 26.6–33.0)
MCHC: 34.1 g/dL (ref 31.5–35.7)
MCV: 94 fL (ref 79–97)
Monocytes Absolute: 0.6 10*3/uL (ref 0.1–0.9)
Monocytes: 14 %
Neutrophils Absolute: 2.6 10*3/uL (ref 1.4–7.0)
Neutrophils: 64 %
Platelets: 270 10*3/uL (ref 150–450)
RBC: 4.3 x10E6/uL (ref 3.77–5.28)
RDW: 12.9 % (ref 11.7–15.4)
WBC: 4.1 10*3/uL (ref 3.4–10.8)

## 2024-02-02 NOTE — Telephone Encounter (Unsigned)
 Copied from CRM [id number]. Topic: Appointments - Scheduling Inquiry for Clinic >> Feb 02, 2024 11:05 AM Philippa Chester F wrote: Patient is looking to schedule her annual wellness visit.   Patint best Contact Number is : [phone]

## 2024-02-20 ENCOUNTER — Ambulatory Visit: Payer: Self-pay

## 2024-02-20 NOTE — Telephone Encounter (Signed)
 Pt has an appt tomorrow 02/21/24 w/Dr Roya.

## 2024-02-20 NOTE — Telephone Encounter (Signed)
  Chief Complaint: back pain Symptoms: back pain and arm pain Frequency: last night Pertinent Negatives: Patient denies injury Disposition: [] ED /[] Urgent Care (no appt availability in office) / [x] Appointment(In office/virtual)/ []  [facility]/ [] Home Care/ [] Refused Recommended Disposition /[] [facility]/ []  Follow-up with PCP Additional Notes: pt states middle of back on left side started last night and she couldn't get any sleep.  States she took a tramadol  and still in pain but just took tramadol  about 30 minutes ago. Pt states she has a blister on right forearm that is painful to touch x almost 2 weeks.   Copied from CRM [id number]. Topic: Clinical - Red Word Triage >> Feb 20, 2024 11:02 AM Carrielelia G wrote: [facility] that prompted transfer to Nurse Triage: severe back pain and blister on right forearm pain Reason for Disposition  Looks like a boil, infected sore, deep ulcer or other infected rash (spreading redness, pus)  [1] MODERATE back pain (e.g., interferes with normal activities) AND [2] present > 3 days  Answer Assessment - Initial Assessment Questions 1. ONSET: "When did the pain start?"     2 weeks 2. LOCATION: "Where is the pain located?"     Right forearm 3. PAIN: "How bad is the pain?" (Scale 1-10; or mild, moderate, severe)   - MILD (1-3): Doesn't interfere with normal activities.   - MODERATE (4-7): Interferes with normal activities (e.g., work or school) or awakens from sleep.   - SEVERE (8-10): Excruciating pain, unable to do any normal activities, unable to hold a cup of water.     10 when touched 4. WORK OR EXERCISE: "Has there been any recent work or exercise that involved this part of the body?"     no 5. CAUSE: "What do you think is causing the arm pain?"     unknown 6. OTHER SYMPTOMS: "Do you have any other symptoms?" (e.g., neck pain, swelling, rash, fever, numbness, weakness)     Back pain  Answer Assessment - Initial Assessment  Questions 1. ONSET: "When did the pain begin?"      Last night  2. LOCATION: "Where does it hurt?" (upper, mid or lower back)     Middle back on left side 3. SEVERITY: "How bad is the pain?"  (e.g., Scale 1-10; mild, moderate, or severe)   - MILD (1-3): Doesn't interfere with normal activities.    - MODERATE (4-7): Interferes with normal activities or awakens from sleep.    - SEVERE (8-10): Excruciating pain, unable to do any normal activities.      9/10 4. PATTERN: "Is the pain constant?" (e.g., yes, no; constant, intermittent)      constant 5. RADIATION: "Does the pain shoot into your legs or somewhere else?"     no 6. CAUSE:  "What do you think is causing the back pain?"      unknown 7. BACK OVERUSE:  "Any recent lifting of heavy objects, strenuous work or exercise?"     Does help boyfriend get ready 8. MEDICINES: "What have you taken so far for the pain?" (e.g., nothing, acetaminophen , NSAIDS)     tramadol  9. NEUROLOGIC SYMPTOMS: "Do you have any weakness, numbness, or problems with bowel/bladder control?"     no 10. OTHER SYMPTOMS: "Do you have any other symptoms?" (e.g., fever, abdomen pain, burning with urination, blood in urine)       no  Protocols used: Arm Pain-A-AH, Back Pain-A-AH

## 2024-02-21 ENCOUNTER — Ambulatory Visit: Payer: Self-pay | Admitting: Student

## 2024-02-21 ENCOUNTER — Ambulatory Visit (INDEPENDENT_AMBULATORY_CARE_PROVIDER_SITE_OTHER): Admitting: Student

## 2024-02-21 VITALS — BP 139/83 | HR 81 | Temp 98.5°F | Ht 62.0 in | Wt 122.1 lb

## 2024-02-21 DIAGNOSIS — G8929 Other chronic pain: Secondary | ICD-10-CM

## 2024-02-21 DIAGNOSIS — M546 Pain in thoracic spine: Secondary | ICD-10-CM

## 2024-02-21 DIAGNOSIS — L989 Disorder of the skin and subcutaneous tissue, unspecified: Secondary | ICD-10-CM | POA: Diagnosis not present

## 2024-02-21 NOTE — Progress Notes (Signed)
 Established Patient Office Visit  Subjective   Patient ID: Debbie Watts, female    DOB: [birth year]  Age: 63 y.o. MRN: [id number]  Chief Complaint  Patient presents with   Skin Problem    Bump/blister right forearm noticed a couple weeks ago    Back Pain    "Severe" pain 6/10 at present but gets worse Currently taking tramadol , will add tylenol  it tramadol  doesn't work     HPI This is a 63 year old female living with a history stated below and presents today as an acute visit for back pain and arm pain. Please see problem based assessment and plan for additional details.   Past Medical History:  Diagnosis Date   Acute cystitis without hematuria 03/16/2018   Arthritis    Asthma    Cancer (HCC)    chemo for breast ca  x38mos   Depression    Deviated nasal septum    Gait disturbance 01/12/2018   Hearing loss 06/11/2014   Bilateral   Herniated cervical disc    Hypertension    Leukocytosis 01/19/2019   Migraine    Miscarriage    2   MS (multiple sclerosis) (HCC)    Restless leg syndrome 06/23/2014   Shingles 12/24/2019   Sleep disturbance 07/10/2020   Sore throat 10/29/2021   Syncope 01/19/2019   Urinary tract infection without hematuria 08/10/2018   UTI (urinary tract infection), uncomplicated 06/09/2020   Wrist pain, acute, left 12/23/2021    ROS   As per assessment and plan  Objective:     BP 139/83 (BP Location: Left Arm, Patient Position: Sitting, Cuff Size: Normal)   Pulse 81   Temp 98.5 F (36.9 C) (Oral)   Ht 5\' 2"  (1.575 m)   Wt 122 lb 1.6 oz (55.4 kg)   SpO2 99%   BMI 22.33 kg/m  BP Readings from Last 3 Encounters:  02/21/24 139/83  01/23/24 (!) 137/92  09/01/23 137/85   Wt Readings from Last 3 Encounters:  02/21/24 122 lb 1.6 oz (55.4 kg)  01/23/24 122 lb (55.3 kg)  09/01/23 129 lb 11.2 oz (58.8 kg)   SpO2 Readings from Last 3 Encounters:  02/21/24 99%  09/01/23 97%  07/14/23 98%      Physical Exam  General: Sitting in  chair, no acute distress Cardiovascular: Regular rate, no murmurs appreciated Pulmonary: Breathing comfortably, no wheezing or crackles Abdomen: Soft, nontender, nondistended, bowel sounds present MSK: +Mild tenderness noted along the left paraspinal muscles, mid thoracic, no hypertonicity  Skin: multiple fleshy ~0.5 cm lesions scattered through out upper back. Raised 1 cm X 1 cm lesion with scales in the middle and red base. Non tender located at the R forearm.     No results found for any visits on 02/21/24.  Last metabolic panel Lab Results  Component Value Date   GLUCOSE 85 09/01/2023   NA 139 09/01/2023   K 4.5 09/01/2023   CL 100 09/01/2023   CO2 25 09/01/2023   BUN 9 09/01/2023   CREATININE 0.90 11/15/2023   EGFR 100 09/01/2023   CALCIUM 8.7 09/01/2023   PROT 6.3 09/01/2023   ALBUMIN 4.3 09/01/2023   LABGLOB 2.0 09/01/2023   AGRATIO 2.4 (H) 10/19/2022   BILITOT 0.3 09/01/2023   ALKPHOS 91 09/01/2023   AST 21 09/01/2023   ALT 22 09/01/2023   ANIONGAP 8 12/12/2019      The ASCVD Risk score (Arnett DK, et al., 2019) failed to calculate for the following reasons:  Cannot find a previous HDL lab   Cannot find a previous total cholesterol lab    Assessment & Plan:  Patient is discussed with Dr Adriane Albe   Problem List Items Addressed This Visit       Musculoskeletal and Integument   Skin lesion of right arm - Primary   Reports about two weeks ago, she noticed a raised skin lesion on her R forearm that has not changed in size, no drainage noted. Pain that is present when she bumps against it. Mild pruritus intermittently. No weakness or numbness of R arm. No bug bites. Per exam, raised 1 cm x 1 cm lesion with crusting in the middle and red base.  - Derm referral for biopsy       Relevant Orders   Ambulatory referral to Dermatology     Other   left-sided thoracic back pain   Started on last Friday, dull pain with intermittent burning association. Its constant,  has improved a bit. Worse at night, keeps her awake. Does not radiate anywhere. She has tried 2 tramadols and 2 tyelnols for the pain to go away. 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 Per exam, she has mild tenderness located at the left paraspinal muscle area along mid thoracic, no hypertonicity. ROM intact.  - Advised stretching  - OTC Voltaren  gel and lidocaine  patches        Return if symptoms worsen or fail to improve.    Lanney Pitts, DO

## 2024-02-21 NOTE — Assessment & Plan Note (Signed)
 Started on last Friday, dull pain with intermittent burning association. Its constant, has improved a bit. Worse at night, keeps her awake. Does not radiate anywhere. She has tried 2 tramadols and 2 tyelnols for the pain to go away. 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 Per exam, she has mild tenderness located at the left paraspinal muscle area along mid thoracic, no hypertonicity. ROM intact.  - Advised stretching  - OTC Voltaren  gel and lidocaine  patches

## 2024-02-21 NOTE — Assessment & Plan Note (Signed)
 Reports about two weeks ago, she noticed a raised skin lesion on her R forearm that has not changed in size, no drainage noted. Pain that is present when she bumps against it. Mild pruritus intermittently. No weakness or numbness of R arm. No bug bites. Per exam, raised 1 cm x 1 cm lesion with crusting in the middle and red base.  - Derm referral for biopsy

## 2024-02-21 NOTE — Patient Instructions (Signed)
 Thank you, Ms.Yvone Herd for allowing us  to provide your care today. Today we discussed:  I sent a referral to dermatology for the lesion on your skin  For your back, you can buy Voltaren  gel or lidocaine  patches from the pharmacy to help with the pain.   I have ordered the following labs for you:  Lab Orders  No laboratory test(s) ordered today     Tests ordered today:  None   Referrals ordered today:   Referral Orders  No referral(s) requested today     I have ordered the following medication/changed the following medications:   Stop the following medications: There are no discontinued medications.   Start the following medications: No orders of the defined types were placed in this encounter.    Follow up:  As needed if symptoms worsen      Remember:   Should you have any questions or concerns please call the internal medicine clinic at [phone].     Lanney Pitts, DO [facility]

## 2024-02-25 NOTE — Progress Notes (Signed)
 Internal Medicine Clinic Attending  Case discussed with the resident at the time of the visit.  We reviewed the resident's history and exam and pertinent patient test results.  I agree with the assessment, diagnosis, and plan of care documented in the resident's note.

## 2024-03-08 ENCOUNTER — Other Ambulatory Visit: Payer: Self-pay | Admitting: Neurology

## 2024-03-08 NOTE — Telephone Encounter (Signed)
 Last seen on 01/23/24 per note "  She is on Sinemet  at bedtme only for RLS. " Follow up scheduled 08/08/24

## 2024-03-22 ENCOUNTER — Other Ambulatory Visit: Payer: Self-pay | Admitting: Internal Medicine

## 2024-03-22 ENCOUNTER — Other Ambulatory Visit: Payer: Self-pay | Admitting: Neurology

## 2024-03-22 DIAGNOSIS — F339 Major depressive disorder, recurrent, unspecified: Secondary | ICD-10-CM

## 2024-03-22 DIAGNOSIS — G8929 Other chronic pain: Secondary | ICD-10-CM

## 2024-03-22 NOTE — Telephone Encounter (Signed)
 Last seen on 01/23/24 Follow up scheduled on 08/08/24

## 2024-03-26 ENCOUNTER — Telehealth: Payer: Self-pay | Admitting: Neurology

## 2024-03-26 ENCOUNTER — Ambulatory Visit (INDEPENDENT_AMBULATORY_CARE_PROVIDER_SITE_OTHER): Payer: Self-pay | Admitting: Internal Medicine

## 2024-03-26 VITALS — BP 128/78 | HR 68 | Temp 97.8°F | Ht 62.0 in | Wt 119.6 lb

## 2024-03-26 DIAGNOSIS — F17210 Nicotine dependence, cigarettes, uncomplicated: Secondary | ICD-10-CM | POA: Diagnosis not present

## 2024-03-26 DIAGNOSIS — I10 Essential (primary) hypertension: Secondary | ICD-10-CM

## 2024-03-26 DIAGNOSIS — M546 Pain in thoracic spine: Secondary | ICD-10-CM

## 2024-03-26 DIAGNOSIS — J449 Chronic obstructive pulmonary disease, unspecified: Secondary | ICD-10-CM

## 2024-03-26 DIAGNOSIS — G8929 Other chronic pain: Secondary | ICD-10-CM

## 2024-03-26 MED ORDER — ALBUTEROL SULFATE (2.5 MG/3ML) 0.083% IN NEBU: 2.5000 mg | INHALATION_SOLUTION | Freq: Four times a day (QID) | RESPIRATORY_TRACT | 2 refills | Status: DC | PRN

## 2024-03-26 NOTE — Assessment & Plan Note (Signed)
 The patient continues to smoke cigarettes.  She states that she is not ready to quit smoking, as her partner is a current smoker and she thinks it would be too difficult to cut back while he is still smoking.  When the patient is ready to quit smoking, she will call the [facility] to discuss starting Chantix to assist her with this.

## 2024-03-26 NOTE — Telephone Encounter (Signed)
 Pt last seen 01/23/24 and next f/u 08/08/24. Last rx refill sent 01/23/24 #60, 11 refills to [place]. They should have refills on file. I called pt. She will call pharmacy to speak to someone directly. She was looking at app that was telling her to contact MD for refills. She will call back if she has trouble setting up shipment.

## 2024-03-26 NOTE — Progress Notes (Signed)
 Internal Medicine Clinic Attending  Case discussed with the resident at the time of the visit.  We reviewed the resident's history and exam and pertinent patient test results.  I agree with the assessment, diagnosis, and plan of care documented in the resident's note.

## 2024-03-26 NOTE — Assessment & Plan Note (Signed)
 The patient endorses occasional shortness of breath and intermittent coughing throughout the day, but denies any shortness of breath currently.  On exam, her lungs are clear to auscultation bilaterally and she has a normal work of breathing on room air.  The patient states that her PRN albuterol  inhaler has not been very effective when she does need it, and she has used her partner's nebulizer machine at times, which has been more helpful.  Plan: - DME order for nebulizer - Albuterol  nebulizer solution sent in - Continue Spiriva 

## 2024-03-26 NOTE — Telephone Encounter (Signed)
 Pt is requesting a refill for Dimethyl Fumarate 240 MG CPDR .  Pharmacy: [facility]

## 2024-03-26 NOTE — Patient Instructions (Signed)
 Thank you, Ms.Yvone Herd for allowing us  to provide your care today. Today we discussed:  Back pain: Keep using lidocaine  patches or voltaren  gel as needed You can take your cyclobenzaprine  twice a day as needed for your pain, instead of just once a day If your pain persists, we can consider physical therapy if you are interested COPD I have sent in a nebulizer machine and albuterol  nebulizer solution for you to use instead of your inhaler High blood pressure Keep taking lisinopril  10 mg daily - your blood pressure looks good! When you are ready to quit smoking, we can help and prescribe Chantix  I have ordered the following labs for you:  Lab Orders  No laboratory test(s) ordered today      Referrals ordered today:   Referral Orders  No referral(s) requested today     I have ordered the following medication/changed the following medications:   Stop the following medications: There are no discontinued medications.   Start the following medications: Meds ordered this encounter  Medications   albuterol  (PROVENTIL ) (2.5 MG/3ML) 0.083% nebulizer solution    Sig: Take 3 mLs (2.5 mg total) by nebulization every 6 (six) hours as needed for wheezing or shortness of breath.    Dispense:  75 mL    Refill:  2     Follow up: 3 months    Should you have any questions or concerns please call the internal medicine clinic at [phone].     Rosalynn Sergent, D.O. [facility]

## 2024-03-26 NOTE — Assessment & Plan Note (Signed)
 Blood pressure is well-controlled at 128/78.  Continue lisinopril  10 mg daily.

## 2024-03-26 NOTE — Progress Notes (Signed)
 CC: routine appt  HPI:  Debbie Watts is a 63 y.o. female living with a history stated below and presents today for a routine office visit to address her chronic medical conditions. Please see problem based assessment and plan for additional details.  Past Medical History:  Diagnosis Date   Acute cystitis without hematuria 03/16/2018   Arthritis    Asthma    Cancer (HCC)    chemo for breast ca  x33mos   Depression    Deviated nasal septum    Gait disturbance 01/12/2018   Hearing loss 06/11/2014   Bilateral   Herniated cervical disc    Hypertension    Leukocytosis 01/19/2019   Migraine    Miscarriage    2   MS (multiple sclerosis) (HCC)    Restless leg syndrome 06/23/2014   Shingles 12/24/2019   Sleep disturbance 07/10/2020   Sore throat 10/29/2021   Syncope 01/19/2019   Urinary tract infection without hematuria 08/10/2018   UTI (urinary tract infection), uncomplicated 06/09/2020   Wrist pain, acute, left 12/23/2021    Current Outpatient Medications on File Prior to Visit  Medication Sig Dispense Refill   albuterol  (VENTOLIN  HFA) 108 (90 Base) MCG/ACT inhaler INHALE 1-2 PUFFS BY MOUTH EVERY 6 HOURS AS NEEDED FOR WHEEZE OR SHORTNESS OF BREATH 18 g 1   anastrozole  (ARIMIDEX ) 1 MG tablet TAKE 1 TABLET BY MOUTH EVERY DAY 90 tablet 3   aspirin -acetaminophen -caffeine  (EXCEDRIN  MIGRAINE) 250-250-65 MG tablet Take 2 tablets by mouth every 6 (six) hours as needed for migraine.     carbidopa -levodopa  (SINEMET  IR) 10-100 MG tablet TAKE 1 TABLET BY MOUTH EVERYDAY AT BEDTIME 90 tablet 1   cyclobenzaprine  (FLEXERIL ) 5 MG tablet TAKE 1 TABLET BY MOUTH 2 TIMES DAILY AS NEEDED FOR MUSCLE SPASMS. 60 tablet 4   Dimethyl Fumarate  240 MG CPDR One po bid 60 capsule 11   fluticasone  (FLONASE ) 50 MCG/ACT nasal spray PLACE 2 SPRAYS INTO BOTH NOSTRILS AT BEDTIME. 48 mL 2   gabapentin  (NEURONTIN ) 300 MG capsule TAKE 2 CAPSULES BY MOUTH TWICE A DAY 360 capsule 3   lisinopril  (ZESTRIL ) 10 MG  tablet TAKE 1 TABLET BY MOUTH EVERY DAY 90 tablet 3   Multiple Vitamin (MULTIVITAMIN WITH MINERALS) TABS tablet Take 2 tablets by mouth daily.     omeprazole  (PRILOSEC  OTC) 20 MG tablet Take 20 mg by mouth daily as needed (acid reflux).     oxybutynin  (DITROPAN  XL) 10 MG 24 hr tablet Take 1 tablet (10 mg total) by mouth at bedtime. 90 tablet 3   PARoxetine  (PAXIL ) 20 MG tablet TAKE 1 TABLET BY MOUTH EVERY DAY 90 tablet 1   Tiotropium Bromide  Monohydrate (SPIRIVA  RESPIMAT) 2.5 MCG/ACT AERS Inhale 2 puffs into the lungs daily. 4 g 3   traMADol  (ULTRAM ) 50 MG tablet Take 1 tablet (50 mg total) by mouth 2 (two) times daily as needed. 60 tablet 5   valACYclovir  (VALTREX ) 1000 MG tablet Take 1 tablet (1,000 mg total) by mouth 2 (two) times daily. 14 tablet 0   [DISCONTINUED] prochlorperazine  (COMPAZINE ) 10 MG tablet TAKE 1 TABLET (10 MG TOTAL) BY MOUTH EVERY 6 (SIX) HOURS AS NEEDED (NAUSEA OR VOMITING). 30 tablet 1   No current facility-administered medications on file prior to visit.    Family History  Adopted: Yes  Problem Relation Age of Onset   Cancer Maternal Uncle        throat    Social History   Socioeconomic History   Marital status: Single  Spouse name: Not on file   Number of children: 2   Years of education: 63   Highest education level: Not on file  Occupational History   Occupation: Disabled  Tobacco Use   Smoking status: Every Day    Current packs/day: 1.00    Average packs/day: 1 pack/day for 44.0 years (44.0 ttl pk-yrs)    Types: Cigarettes   Smokeless tobacco: Never   Tobacco comments:    1 pk per day  Vaping Use   Vaping status: Former  Substance and Sexual Activity   Alcohol use: Yes    Comment: 0-5 beers daily   Drug use: Yes    Types: Marijuana   Sexual activity: Not Currently    Partners: Male  Other Topics Concern   Not on file  Social History Narrative   Patient is single with 2 children.   Patient is right handed.   Current Social History  08/10/2018        Right handed       Patient lives with significant other Durene Gill) in one level Townhome 08/10/2018    Transportation: Patient has own vehicle  08/10/2018   Important Relationships Durene Gill 08/10/2018    Pets: one dog named Browser 08/10/2018   Education / Work:  12th grade/ Disabled 08/10/2018   Interests / Fun: Watching baseball and Nascar 08/10/2018   Current Stressors: Significant other is depressed over his poor health 08/10/2018   Religious / Personal Beliefs: "I believe in God" 08/10/2018   Other: "I had a miscarriage before I had my 2 sons." 08/10/2018   L. Corinna Dickens, RN, BSN                                                                                                 Social Drivers of Health   Financial Resource Strain: Low Risk  (01/26/2023)   Overall Financial Resource Strain (CARDIA)    Difficulty of Paying Living Expenses: Not very hard  Food Insecurity: Low Risk  (07/21/2023)   Received from [facility]   Hunger Vital Sign    Worried About Running Out of Food in the Last Year: Never true    Ran Out of Food in the Last Year: Never true  Transportation Needs: No Transportation Needs (07/21/2023)   Received from [facility]    In the past 12 months, has lack of reliable transportation kept you from medical appointments, meetings, work or from getting things needed for daily living? : No  Physical Activity: Inactive (01/26/2023)   Exercise Vital Sign    Days of Exercise per Week: 0 days    Minutes of Exercise per Session: 0 min  Stress: Stress Concern Present (01/26/2023)   [employer] of Occupational Health - Occupational Stress Questionnaire    Feeling of Stress : To some extent  Social Connections: Moderately Isolated (01/26/2023)   Social Connection and Isolation Panel [NHANES]    Frequency of Communication with Friends and Family: Once a week    Frequency of Social Gatherings with Friends and Family: Never    Attends  Religious Services:  More than 4 times per year    Active Member of Clubs or Organizations: No    Attends Banker Meetings: Never    Marital Status: Living with partner  Intimate Partner Violence: Not At Risk (01/26/2023)   Humiliation, Afraid, Rape, and Kick questionnaire    Fear of Current or Ex-Partner: No    Emotionally Abused: No    Physically Abused: No    Sexually Abused: No    Review of Systems: ROS negative except for what is noted on the assessment and plan.  Vitals:   03/26/24 1328  BP: 128/78  Pulse: 68  Temp: 97.8 F (36.6 C)  TempSrc: Oral  SpO2: 91%  Weight: 119 lb 9.6 oz (54.3 kg)  Height: 5\' 2"  (1.575 m)    Physical Exam: Constitutional: sitting in chair comfortably, no acute distress Cardiovascular: regular rate and rhythm, no m/r/g Pulmonary/Chest: normal work of breathing on room air, lungs clear to auscultation bilaterally MSK: normal bulk and tone, no midline spinal tenderness, but there is some mild tenderness along the left paraspinal muscles in low thoracic/upper lumbar region Skin: warm and dry Psych: normal mood and behavior  Assessment & Plan:   Patient discussed with Dr. Broadus Canes  Essential hypertension Blood pressure is well-controlled at 128/78.  Continue lisinopril  10 mg daily.  Cigarette nicotine dependence without complication The patient continues to smoke cigarettes.  She states that she is not ready to quit smoking, as her partner is a current smoker and she thinks it would be too difficult to cut back while he is still smoking.  When the patient is ready to quit smoking, she will call the [facility] to discuss starting Chantix to assist her with this.  Chronic obstructive pulmonary disease (HCC) The patient endorses occasional shortness of breath and intermittent coughing throughout the day, but denies any shortness of breath currently.  On exam, her lungs are clear to auscultation bilaterally and she has a normal work of  breathing on room air.  The patient states that her PRN albuterol  inhaler has not been very effective when she does need it, and she has used her partner's nebulizer machine at times, which has been more helpful.  Plan: - DME order for nebulizer - Albuterol  nebulizer solution sent in - Continue Spiriva   left-sided thoracic back pain The patient continues to endorse left-sided lower thoracic/upper lumbar back pain.  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  She occasionally has numbness/tingling down her left leg, but does not have this currently.  Additionally, she has a history of a previous herniated disc, and thinks that she could have done this again.  The patient has been taking tramadol  as needed for her pain and Flexeril  once daily, in addition to using lidocaine  patches, though she states that the patches are not helping much.  On exam, the patient has no midline spinal tenderness, but does have tenderness along the left paraspinal muscles in her lower thoracic and upper lumbar region.  Plan: - Advised patient she can increase her Flexeril  to twice daily as needed - Continue tramadol  twice daily as needed - Continue lidocaine  patches - Discussed referral to physical therapy, but patient declined   Joette Schmoker, D.O. [facility], PGY-3 Phone: [phone] Date 03/26/2024 Time 4:00 PM

## 2024-03-26 NOTE — Assessment & Plan Note (Signed)
 The patient continues to endorse left-sided lower thoracic/upper lumbar back pain.  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  She occasionally has numbness/tingling down her left leg, but does not have this currently.  Additionally, she has a history of a previous herniated disc, and thinks that she could have done this again.  The patient has been taking tramadol  as needed for her pain and Flexeril  once daily, in addition to using lidocaine  patches, though she states that the patches are not helping much.  On exam, the patient has no midline spinal tenderness, but does have tenderness along the left paraspinal muscles in her lower thoracic and upper lumbar region.  Plan: - Advised patient she can increase her Flexeril  to twice daily as needed - Continue tramadol  twice daily as needed - Continue lidocaine  patches - Discussed referral to physical therapy, but patient declined

## 2024-04-11 ENCOUNTER — Ambulatory Visit

## 2024-04-11 VITALS — BP 128/78 | Ht 62.0 in | Wt 120.0 lb

## 2024-04-11 DIAGNOSIS — Z0000 Encounter for general adult medical examination without abnormal findings: Secondary | ICD-10-CM | POA: Diagnosis not present

## 2024-04-11 NOTE — Patient Instructions (Signed)
 Debbie Watts , Thank you for taking time out of your busy schedule to complete your Annual Wellness Visit with me. I enjoyed our conversation and look forward to speaking with you again next year. I, as well as your care team,  appreciate your ongoing commitment to your health goals. Please review the following plan we discussed and let me know if I can assist you in the future. Your Game plan/ To Do List    Referrals: If you haven't heard from the office you've been referred to, please reach out to them at the phone provided.  None  Follow up Visits: Next Medicare AWV with our clinical staff: 04/17/2025   Have you seen your provider in the last 6 months (3 months if uncontrolled diabetes)? No Next Office Visit with your provider: none patient was seen on 03/26/2024  Clinician Recommendations:  Aim for 30 minutes of exercise or brisk walking, 6-8 glasses of water, and 5 servings of fruits and vegetables each day.       This is a list of the screening recommended for you and due dates:  Health Maintenance  Topic Date Due   COVID-19 Vaccine (4 - 2024-25 season) 06/26/2023   Flu Shot  05/25/2024   Screening for Lung Cancer  11/14/2024   Stool Blood Test  01/08/2025   Medicare Annual Wellness Visit  04/11/2025   Mammogram  07/10/2025   Pap with HPV screening  08/05/2026   DTaP/Tdap/Td vaccine (2 - Td or Tdap) 04/20/2028   Pneumococcal Vaccination  Completed   Hepatitis C Screening  Completed   HIV Screening  Completed   Zoster (Shingles) Vaccine  Completed   HPV Vaccine  Aged Out   Meningitis B Vaccine  Aged Out   Colon Cancer Screening  Discontinued    Advanced directives: (Declined) Advance directive discussed with you today. Even though you declined this today, please call our office should you change your mind, and we can give you the proper paperwork for you to fill out. Advance Care Planning is important because it:  [x]  Makes sure you receive the medical care that is consistent  with your values, goals, and preferences  [x]  It provides guidance to your family and loved ones and reduces their decisional burden about whether or not they are making the right decisions based on your wishes.  Follow the link provided in your after visit summary or read over the paperwork we have mailed to you to help you started getting your Advance Directives in place. If you need assistance in completing these, please reach out to us  so that we can help you!  See attachments for Preventive Care and Fall Prevention Tips.

## 2024-04-11 NOTE — Progress Notes (Signed)
 Because this visit was a virtual/telehealth visit,  certain criteria was not obtained, such a blood pressure, CBG if applicable, and timed get up and go. Any medications not marked as taking were not mentioned during the medication reconciliation part of the visit. Any vitals not documented were not able to be obtained due to this being a telehealth visit or patient was unable to self-report a recent blood pressure reading due to a lack of equipment at home via telehealth. Vitals that have been documented are verbally provided by the patient.   This visit was performed by a medical professional under my direct supervision. I was immediately available for consultation/collaboration. I have reviewed and agree with the Annual Wellness Visit documentation.  Subjective:   Debbie Watts is a 63 y.o. who presents for a Medicare Wellness preventive visit.  As a reminder, Annual Wellness Visits don't include a physical exam, and some assessments may be limited, especially if this visit is performed virtually. We may recommend an in-person follow-up visit with your provider if needed.  Visit Complete: Virtual I connected with  [name] on 04/11/24 by a audio enabled telemedicine application and verified that I am speaking with the correct person using two identifiers.  Patient Location: Home  Provider Location: Home Office  I discussed the limitations of evaluation and management by telemedicine. The patient expressed understanding and agreed to proceed.  Vital Signs: Because this visit was a virtual/telehealth visit, some criteria may be missing or patient reported. Any vitals not documented were not able to be obtained and vitals that have been documented are patient reported.  VideoDeclined- This patient declined Librarian, academic. Therefore the visit was completed with audio only.  Persons Participating in Visit: Patient.  AWV Questionnaire: No: Patient  Medicare AWV questionnaire was not completed prior to this visit.  Cardiac Risk Factors include: advanced age (>9men, >21 women);female gender;obesity (BMI >30kg/m2);hypertension     Objective:    Today's Vitals   04/11/24 1331 04/11/24 1332  BP: 128/78   Weight: 120 lb (54.4 kg)   Height: 5' 2 (1.575 m)   PainSc:  5    Body mass index is 21.95 kg/m.     04/11/2024    1:30 PM 07/14/2023    2:03 PM 02/04/2023   12:32 PM 01/26/2023    1:14 PM 07/12/2022   11:19 AM 06/23/2022    9:34 AM 12/23/2021    1:57 PM  Advanced Directives  Does Patient Have a Medical Advance Directive? No No No No No No No  Would patient like information on creating a medical advance directive? No - Patient declined No - Patient declined  No - Patient declined No - Patient declined No - Patient declined No - Patient declined    Current Medications (verified) Outpatient Encounter Medications as of 04/11/2024  Medication Sig   albuterol  (PROVENTIL ) (2.5 MG/3ML) 0.083% nebulizer solution Take 3 mLs (2.5 mg total) by nebulization every 6 (six) hours as needed for wheezing or shortness of breath.   albuterol  (VENTOLIN  HFA) 108 (90 Base) MCG/ACT inhaler INHALE 1-2 PUFFS BY MOUTH EVERY 6 HOURS AS NEEDED FOR WHEEZE OR SHORTNESS OF BREATH   anastrozole  (ARIMIDEX ) 1 MG tablet TAKE 1 TABLET BY MOUTH EVERY DAY   aspirin -acetaminophen -caffeine  (EXCEDRIN  MIGRAINE) 250-250-65 MG tablet Take 2 tablets by mouth every 6 (six) hours as needed for migraine.   carbidopa -levodopa  (SINEMET  IR) 10-100 MG tablet TAKE 1 TABLET BY MOUTH EVERYDAY AT BEDTIME   cyclobenzaprine  (FLEXERIL ) 5  MG tablet TAKE 1 TABLET BY MOUTH 2 TIMES DAILY AS NEEDED FOR MUSCLE SPASMS.   Dimethyl Fumarate  240 MG CPDR One po bid   fluticasone  (FLONASE ) 50 MCG/ACT nasal spray PLACE 2 SPRAYS INTO BOTH NOSTRILS AT BEDTIME.   gabapentin  (NEURONTIN ) 300 MG capsule TAKE 2 CAPSULES BY MOUTH TWICE A DAY   lisinopril  (ZESTRIL ) 10 MG tablet TAKE 1 TABLET BY MOUTH EVERY DAY    Multiple Vitamin (MULTIVITAMIN WITH MINERALS) TABS tablet Take 2 tablets by mouth daily.   omeprazole  (PRILOSEC  OTC) 20 MG tablet Take 20 mg by mouth daily as needed (acid reflux).   oxybutynin  (DITROPAN  XL) 10 MG 24 hr tablet Take 1 tablet (10 mg total) by mouth at bedtime.   PARoxetine  (PAXIL ) 20 MG tablet TAKE 1 TABLET BY MOUTH EVERY DAY   Tiotropium Bromide  Monohydrate (SPIRIVA  RESPIMAT) 2.5 MCG/ACT AERS Inhale 2 puffs into the lungs daily.   traMADol  (ULTRAM ) 50 MG tablet Take 1 tablet (50 mg total) by mouth 2 (two) times daily as needed.   valACYclovir  (VALTREX ) 1000 MG tablet Take 1 tablet (1,000 mg total) by mouth 2 (two) times daily.   [DISCONTINUED] prochlorperazine  (COMPAZINE ) 10 MG tablet TAKE 1 TABLET (10 MG TOTAL) BY MOUTH EVERY 6 (SIX) HOURS AS NEEDED (NAUSEA OR VOMITING).   No facility-administered encounter medications on file as of 04/11/2024.    Allergies (verified) Procaine   History: Past Medical History:  Diagnosis Date   Acute cystitis without hematuria 03/16/2018   Arthritis    Asthma    Cancer (HCC)    chemo for breast ca  x21mos   Depression    Deviated nasal septum    Gait disturbance 01/12/2018   Hearing loss 06/11/2014   Bilateral   Herniated cervical disc    Hypertension    Leukocytosis 01/19/2019   Migraine    Miscarriage    2   MS (multiple sclerosis) (HCC)    Restless leg syndrome 06/23/2014   Shingles 12/24/2019   Sleep disturbance 07/10/2020   Sore throat 10/29/2021   Syncope 01/19/2019   Urinary tract infection without hematuria 08/10/2018   UTI (urinary tract infection), uncomplicated 06/09/2020   Wrist pain, acute, left 12/23/2021   Past Surgical History:  Procedure Laterality Date   BREAST BIOPSY Right 11/21/2018   Malignant   HEMATOMA EVACUATION Right 05/01/2019   Procedure: EVACUATION HEMATOMA;  Surgeon: Enid Harry, MD;  Location: [facility] OR;  Service: General;  Laterality: Right;   MASTECTOMY Right 04/30/2019   MASTECTOMY  W/ SENTINEL NODE BIOPSY Right 04/30/2019   Procedure: RIGHT MASTECTOMY WITH  RIGHT AXILLARY SENTINEL LYMPH NODE BIOPSY INJECT BLUE DYE;  Surgeon: Enid Harry, MD;  Location: [facility] OR;  Service: General;  Laterality: Right;   NASAL SEPTUM SURGERY     SIMPLE MASTECTOMY WITH AXILLARY SENTINEL NODE BIOPSY Right 04/30/2019   spinal injections     Family History  Adopted: Yes  Problem Relation Age of Onset   Cancer Maternal Uncle        throat   Social History   Socioeconomic History   Marital status: Single    Spouse name: Not on file   Number of children: 2   Years of education: 12   Highest education level: Not on file  Occupational History   Occupation: Disabled  Tobacco Use   Smoking status: Every Day    Current packs/day: 1.00    Average packs/day: 1 pack/day for 44.0 years (44.0 ttl pk-yrs)    Types: Cigarettes   Smokeless  tobacco: Never   Tobacco comments:    1 pk per day  Vaping Use   Vaping status: Former  Substance and Sexual Activity   Alcohol use: Yes    Comment: 0-5 beers daily   Drug use: Yes    Types: Marijuana   Sexual activity: Not Currently    Partners: Male  Other Topics Concern   Not on file  Social History Narrative   Patient is single with 2 children.   Patient is right handed.   Current Social History 08/10/2018        Right handed       Patient lives with significant other Durene Gill) in one level Townhome 08/10/2018    Transportation: Patient has own vehicle  08/10/2018   Important Relationships Durene Gill 08/10/2018    Pets: one dog named Browser 08/10/2018   Education / Work:  12th grade/ Disabled 08/10/2018   Interests / Fun: Watching baseball and Nascar 08/10/2018   Current Stressors: Significant other is depressed over his poor health 08/10/2018   Religious / Personal Beliefs: I believe in God 08/10/2018   Other: I had a miscarriage before I had my 2 sons. 08/10/2018   L. Corinna Dickens, RN, BSN                                                                                                  Social Drivers of Health   Financial Resource Strain: Low Risk  (04/11/2024)   Overall Financial Resource Strain (CARDIA)    Difficulty of Paying Living Expenses: Not very hard  Food Insecurity: No Food Insecurity (04/11/2024)   Hunger Vital Sign    Worried About Running Out of Food in the Last Year: Never true    Ran Out of Food in the Last Year: Never true  Transportation Needs: No Transportation Needs (04/11/2024)   PRAPARE - Administrator, Civil Service (Medical): No    Lack of Transportation (Non-Medical): No  Physical Activity: Inactive (04/11/2024)   Exercise Vital Sign    Days of Exercise per Week: 0 days    Minutes of Exercise per Session: 0 min  Stress: Stress Concern Present (04/11/2024)   [employer] of Occupational Health - Occupational Stress Questionnaire    Feeling of Stress: To some extent  Social Connections: Moderately Isolated (04/11/2024)   Social Connection and Isolation Panel    Frequency of Communication with Friends and Family: Once a week    Frequency of Social Gatherings with Friends and Family: Never    Attends Religious Services: More than 4 times per year    Active Member of [employer] or Organizations: No    Attends Engineer, structural: Never    Marital Status: Living with partner    Tobacco Counseling Ready to quit: Not Answered Counseling given: Not Answered Tobacco comments: 1 pk per day    Clinical Intake:  Pre-visit preparation completed: Yes  Pain : 0-10 Pain Score: 5  Pain Type: Chronic pain Pain Location: Back Pain Orientation: Lower Pain Descriptors / Indicators: Burning, Aching, Throbbing Pain Relieving Factors: patient takes tramadol   Pain Relieving Factors: patient takes tramadol   BMI - recorded: 21.95 Nutritional Risks: None Diabetes: No  Lab Results  Component Value Date   HGBA1C 5.3 12/07/2017     How often do you need to have someone  help you when you read instructions, pamphlets, or other written materials from your doctor or pharmacy?: 1 - Never     Information entered by :: [employer]   Activities of Daily Living     04/11/2024    1:35 PM 03/26/2024    1:28 PM  In your present state of health, do you have any difficulty performing the following activities:  Hearing? 0 0  Vision? 0 0  Difficulty concentrating or making decisions? 0 0  Walking or climbing stairs? 0 0  Dressing or bathing? 0 0  Doing errands, shopping? 0 0  Preparing Food and eating ? N   Using the Toilet? N   In the past six months, have you accidently leaked urine? N   Do you have problems with loss of bowel control? N   Managing your Medications? N   Managing your Finances? N   Housekeeping or managing your Housekeeping? N     Patient Care Team: Atway, Rayann N, DO as PCP - General Ayesha Lente, MD (Inactive) as Consulting Physician (General Surgery) Cameron Cea, MD as Consulting Physician (Hematology and Oncology) Johna Myers, MD as Consulting Physician (Radiation Oncology)  I have updated your Care Teams any recent Medical Services you may have received from other providers in the past year.     Assessment:   This is a routine wellness examination for Lindsey.  Hearing/Vision screen Hearing Screening - Comments:: Patient has hearing aids  Vision Screening - Comments:: Patient has glasses . [facility]    Goals Addressed             This Visit's Progress    Patient Stated       Patient would like to get her boyfriend better        Depression Screen     04/11/2024    1:37 PM 03/26/2024    1:28 PM 02/21/2024   10:13 AM 01/26/2023    1:15 PM 01/26/2023   11:22 AM 06/23/2022    9:35 AM 06/16/2022    2:00 PM  PHQ 2/9 Scores  PHQ - 2 Score 1  2 0 0 0 1  PHQ- 9 Score 3  9 2 2  3   Exception Documentation  Patient refusal         Fall Risk     04/11/2024    1:34 PM 03/26/2024     1:28 PM 01/26/2023    1:14 PM 01/26/2023   11:21 AM 06/16/2022    2:00 PM  Fall Risk   Falls in the past year? 0 0 1 1 1   Number falls in past yr: 0 0 1 1 0  Injury with Fall? 0 0 1 1 1   Risk for fall due to : No Fall Risks No Fall Risks Impaired balance/gait Impaired balance/gait History of fall(s)  Follow up Falls evaluation completed Falls evaluation completed Falls evaluation completed;Falls prevention discussed Falls evaluation completed Falls evaluation completed      Data saved with a previous flowsheet row definition    MEDICARE RISK AT HOME:  Medicare Risk at Home Any stairs in or around the home?: Yes If so, are there any without handrails?: No Home free of loose throw rugs in walkways,  pet beds, electrical cords, etc?: Yes Adequate lighting in your home to reduce risk of falls?: Yes Life alert?: No Use of a cane, walker or w/c?: No Grab bars in the bathroom?: No Shower chair or bench in shower?: Yes Elevated toilet seat or a handicapped toilet?: No  TIMED UP AND GO:  Was the test performed?  No  Cognitive Function: 6CIT completed        04/11/2024    1:33 PM 01/26/2023    1:15 PM  6CIT Screen  What Year? 0 points 0 points  What month? 0 points 0 points  What time? 0 points 0 points  Count back from 20 0 points 0 points  Months in reverse 0 points 0 points  Repeat phrase 0 points 0 points  Total Score 0 points 0 points    Immunizations Immunization History  Administered Date(s) Administered   Influenza, Seasonal, Injecte, Preservative Fre 07/23/2014, 07/25/2015, 09/01/2023   Influenza,inj,Quad PF,6+ Mos 07/26/2016, 08/31/2017, 07/20/2018, 09/06/2019, 07/10/2020, 08/05/2021   PFIZER(Purple Top)SARS-COV-2 Vaccination 11/26/2019, 12/24/2019, 10/11/2020   PNEUMOCOCCAL CONJUGATE-20 11/10/2021   Pneumococcal Polysaccharide-23 07/23/2014   Tdap 04/20/2018   Zoster Recombinant(Shingrix) 06/24/2020    Screening Tests Health Maintenance  Topic Date Due    COVID-19 Vaccine (4 - 2024-25 season) 06/26/2023   INFLUENZA VACCINE  05/25/2024   Lung Cancer Screening  11/14/2024   COLON CANCER SCREENING ANNUAL FOBT  01/08/2025   Medicare Annual Wellness (AWV)  04/11/2025   MAMMOGRAM  07/10/2025   Cervical Cancer Screening (HPV/Pap Cotest)  08/05/2026   DTaP/Tdap/Td (2 - Td or Tdap) 04/20/2028   Pneumococcal Vaccine 30-32 Years old  Completed   Hepatitis C Screening  Completed   HIV Screening  Completed   Zoster Vaccines- Shingrix  Completed   HPV VACCINES  Aged Out   Meningococcal B Vaccine  Aged Out   Colonoscopy  Discontinued    Health Maintenance  Health Maintenance Due  Topic Date Due   COVID-19 Vaccine (4 - 2024-25 season) 06/26/2023   Health Maintenance Items Addressed:patient declined   Additional Screening:  Vision Screening: Recommended annual ophthalmology exams for early detection of glaucoma and other disorders of the eye. Would you like a referral to an eye doctor? No    Dental Screening: Recommended annual dental exams for proper oral hygiene  Community Resource Referral / Chronic Care Management: CRR required this visit?  No   CCM required this visit?  No   Plan:    I have personally reviewed and noted the following in the patient's chart:   Medical and social history Use of alcohol, tobacco or illicit drugs  Current medications and supplements including opioid prescriptions. Patient is not currently taking opioid prescriptions. Functional ability and status Nutritional status Physical activity Advanced directives List of other physicians Hospitalizations, surgeries, and ER visits in previous 12 months Vitals Screenings to include cognitive, depression, and falls Referrals and appointments  In addition, I have reviewed and discussed with patient certain preventive protocols, quality metrics, and best practice recommendations. A written personalized care plan for preventive services as well as general  preventive health recommendations were provided to patient.   Freeda Jerry, [place]   04/11/2024   After Visit Summary: (MyChart) Due to this being a telephonic visit, the after visit summary with patients personalized plan was offered to patient via MyChart   Notes: Nothing significant to report at this time.

## 2024-04-25 ENCOUNTER — Other Ambulatory Visit: Payer: Self-pay | Admitting: Neurology

## 2024-04-25 DIAGNOSIS — G35 Multiple sclerosis: Secondary | ICD-10-CM

## 2024-04-26 NOTE — Telephone Encounter (Signed)
 Last seen on 01/23/24 Follow up scheduled on 08/08/24   Dispensed Days Supply Quantity Provider Pharmacy  GABAPENTIN  300 MG CAPSULE 04/12/2024 90 360 each Sater, Debbie Watts LABOR, MD [id number] [place]

## 2024-04-29 ENCOUNTER — Encounter: Payer: Self-pay | Admitting: *Deleted

## 2024-04-30 DIAGNOSIS — L821 Other seborrheic keratosis: Secondary | ICD-10-CM | POA: Diagnosis not present

## 2024-04-30 DIAGNOSIS — D225 Melanocytic nevi of trunk: Secondary | ICD-10-CM | POA: Diagnosis not present

## 2024-04-30 DIAGNOSIS — R233 Spontaneous ecchymoses: Secondary | ICD-10-CM | POA: Diagnosis not present

## 2024-04-30 DIAGNOSIS — L814 Other melanin hyperpigmentation: Secondary | ICD-10-CM | POA: Diagnosis not present

## 2024-04-30 DIAGNOSIS — L570 Actinic keratosis: Secondary | ICD-10-CM | POA: Diagnosis not present

## 2024-07-11 DIAGNOSIS — Z1231 Encounter for screening mammogram for malignant neoplasm of breast: Secondary | ICD-10-CM | POA: Diagnosis not present

## 2024-07-16 ENCOUNTER — Inpatient Hospital Stay: Payer: Medicare HMO | Attending: Hematology and Oncology | Admitting: Hematology and Oncology

## 2024-07-16 VITALS — BP 137/80 | HR 73 | Temp 98.1°F | Resp 17 | Ht 62.0 in | Wt 112.1 lb

## 2024-07-16 DIAGNOSIS — G35 Multiple sclerosis: Secondary | ICD-10-CM | POA: Insufficient documentation

## 2024-07-16 DIAGNOSIS — Z79899 Other long term (current) drug therapy: Secondary | ICD-10-CM | POA: Diagnosis not present

## 2024-07-16 DIAGNOSIS — Z1721 Progesterone receptor positive status: Secondary | ICD-10-CM | POA: Diagnosis not present

## 2024-07-16 DIAGNOSIS — C50411 Malignant neoplasm of upper-outer quadrant of right female breast: Secondary | ICD-10-CM | POA: Diagnosis not present

## 2024-07-16 DIAGNOSIS — Z79811 Long term (current) use of aromatase inhibitors: Secondary | ICD-10-CM | POA: Insufficient documentation

## 2024-07-16 DIAGNOSIS — Z1731 Human epidermal growth factor receptor 2 positive status: Secondary | ICD-10-CM | POA: Insufficient documentation

## 2024-07-16 DIAGNOSIS — Z170 Estrogen receptor positive status [ER+]: Secondary | ICD-10-CM | POA: Insufficient documentation

## 2024-07-16 DIAGNOSIS — C50811 Malignant neoplasm of overlapping sites of right female breast: Secondary | ICD-10-CM | POA: Insufficient documentation

## 2024-07-16 DIAGNOSIS — Z9011 Acquired absence of right breast and nipple: Secondary | ICD-10-CM | POA: Diagnosis not present

## 2024-07-16 MED ORDER — ANASTROZOLE 1 MG PO TABS: 1.0000 mg | ORAL_TABLET | Freq: Every day | ORAL | 3 refills | Status: AC

## 2024-07-16 NOTE — Progress Notes (Signed)
 Patient Care Team: Tobie Gaines, DO as PCP - General (Internal Medicine) Odean Potts, MD as Consulting Physician (Hematology and Oncology) Dewey Rush, MD as Consulting Physician (Radiation Oncology)  DIAGNOSIS:  Encounter Diagnosis  Name Primary?   Malignant neoplasm of upper-outer quadrant of right breast in female, estrogen receptor positive (HCC) Yes    SUMMARY OF ONCOLOGIC HISTORY: Oncology History  Malignant neoplasm of upper-outer quadrant of right breast in female, estrogen receptor positive (HCC)  11/21/2018 Initial Diagnosis   Screening detected right breast mass upper outer quadrant, in addition to areas of calcifications which were not biopsied.  By ultrasound she had 2 breast masses 12 o'clock position 2.9 cm: Grade 2 IDC with high-grade DCIS ER 100%, PR 70%, Ki-67 10%, HER-2 3+ by IHC and 11 o'clock position 1 cm, ER 90%, PR 50%, Ki-67 15%, HER-2 3+ by IHC, T2N0 stage Ib   11/29/2018 Cancer Staging   Staging form: Breast, AJCC 8th Edition - Clinical stage from 11/29/2018: Stage IB (cT2, cN0, cM0, G3, ER+, PR+, HER2+) - Signed by Odean Potts, MD on 11/29/2018   12/12/2018 -  Neo-Adjuvant Chemotherapy   Neoadjuvant chemotherapy with [facility] Perjeta    01/18/2019 - 01/20/2019 Hospital Admission   Syncope   04/30/2019 Surgery   Right mastectomy: Grade 2 IDC, 3.9 cm, high-grade DCIS, margins are negative, 0/2 lymph nodes negative, ER 95%, PR 80%, HER-2 2+ equivocal, T2 N0   04/30/2019 Cancer Staging   Staging form: Breast, AJCC 8th Edition - Pathologic stage from 04/30/2019: No Stage Recommended (ypT2, pN0, cM0, G2, ER+, PR+, HER2-) - Signed by Crawford Morna Pickle, NP on 05/09/2019   05/24/2019 - 12/12/2019 Chemotherapy   Adjuvant Kadcyla    05/24/2019 -  Anti-estrogen oral therapy   Anastrozole  1 mg daily     CHIEF COMPLIANT: Surveillance of breast cancer on anastrozole  therapy  HISTORY OF PRESENT ILLNESS: History of Present Illness Debbie Watts is a 63 year old female  with breast cancer and multiple sclerosis who presents for follow-up regarding her anastrozole  treatment.  She has been on anastrozole  for five years following a mastectomy for a 3.9 cm estrogen receptor-positive breast cancer. She has experienced a recent unintentional weight loss of five and a half pounds, which she views positively. Her multiple sclerosis remains stable with no worsening or improvement in symptoms.     ALLERGIES:  is allergic to procaine.  MEDICATIONS:  Current Outpatient Medications  Medication Sig Dispense Refill   anastrozole  (ARIMIDEX ) 1 MG tablet TAKE 1 TABLET BY MOUTH EVERY DAY 90 tablet 3   aspirin -acetaminophen -caffeine  (EXCEDRIN  MIGRAINE) 250-250-65 MG tablet Take 2 tablets by mouth every 6 (six) hours as needed for migraine.     carbidopa -levodopa  (SINEMET  IR) 10-100 MG tablet TAKE 1 TABLET BY MOUTH EVERYDAY AT BEDTIME 90 tablet 1   cyclobenzaprine  (FLEXERIL ) 5 MG tablet TAKE 1 TABLET BY MOUTH 2 TIMES DAILY AS NEEDED FOR MUSCLE SPASMS. 60 tablet 4   Dimethyl Fumarate  240 MG CPDR One po bid 60 capsule 11   fluticasone  (FLONASE ) 50 MCG/ACT nasal spray PLACE 2 SPRAYS INTO BOTH NOSTRILS AT BEDTIME. 48 mL 2   gabapentin  (NEURONTIN ) 300 MG capsule TAKE 2 CAPSULES BY MOUTH TWICE A DAY 360 capsule 0   lisinopril  (ZESTRIL ) 10 MG tablet TAKE 1 TABLET BY MOUTH EVERY DAY 90 tablet 3   Multiple Vitamin (MULTIVITAMIN WITH MINERALS) TABS tablet Take 2 tablets by mouth daily.     omeprazole  (PRILOSEC  OTC) 20 MG tablet Take 20 mg by mouth daily as  needed (acid reflux).     oxybutynin  (DITROPAN  XL) 10 MG 24 hr tablet Take 1 tablet (10 mg total) by mouth at bedtime. 90 tablet 3   PARoxetine  (PAXIL ) 20 MG tablet TAKE 1 TABLET BY MOUTH EVERY DAY 90 tablet 1   Tiotropium Bromide  Monohydrate (SPIRIVA  RESPIMAT) 2.5 MCG/ACT AERS Inhale 2 puffs into the lungs daily. 4 g 3   traMADol  (ULTRAM ) 50 MG tablet Take 1 tablet (50 mg total) by mouth 2 (two) times daily as needed. 60 tablet 5    No current facility-administered medications for this visit.    PHYSICAL EXAMINATION: ECOG PERFORMANCE STATUS: 1 - Symptomatic but completely ambulatory  Vitals:   07/16/24 1420  BP: 137/80  Pulse: 73  Resp: 17  Temp: 98.1 F (36.7 C)  SpO2: 100%   Filed Weights   07/16/24 1420  Weight: 112 lb 1.6 oz (50.8 kg)     LABORATORY DATA:  I have reviewed the data as listed    Latest Ref Rng & Units 11/15/2023    5:11 PM 09/01/2023    2:29 PM 10/19/2022   11:35 AM  CMP  Glucose 70 - 99 mg/dL  85  896   BUN 8 - 27 mg/dL  9  14   Creatinine 9.55 - 1.00 mg/dL 9.09  9.34  9.28   Sodium 134 - 144 mmol/L  139  140   Potassium 3.5 - 5.2 mmol/L  4.5  4.0   Chloride 96 - 106 mmol/L  100  101   CO2 20 - 29 mmol/L  25  26   Calcium 8.7 - 10.3 mg/dL  8.7  8.8   Total Protein 6.0 - 8.5 g/dL  6.3  6.4   Total Bilirubin 0.0 - 1.2 mg/dL  0.3  0.3   Alkaline Phos 44 - 121 IU/L  91  93   AST 0 - 40 IU/L  21  18   ALT 0 - 32 IU/L  22  18     Lab Results  Component Value Date   WBC 4.1 01/23/2024   HGB 13.7 01/23/2024   HCT 40.2 01/23/2024   MCV 94 01/23/2024   PLT 270 01/23/2024   NEUTROABS 2.6 01/23/2024    ASSESSMENT & PLAN:  Malignant neoplasm of upper-outer quadrant of right breast in female, estrogen receptor positive (HCC) 11/21/2018:Screening detected right breast mass upper outer quadrant, in addition to areas of calcifications which were not biopsied.  By ultrasound she had 2 breast masses 12 o'clock position 2.9 cm: Grade 2 IDC with high-grade DCIS ER 100%, PR 70%, Ki-67 10%, HER-2 3+ by IHC and 11 o'clock position 1 cm, ER 90%, PR 50%, Ki-67 15%, HER-2 3+ by IHC, T2N0 stage Ib   Neoadjuvant chemotherapy with [facility] Perjeta  x6 cycles 12/12/2018-03/27/2019   04/30/2019: Right mastectomy: Grade 2 IDC, 3.9 cm, high-grade DCIS, margins are negative, 0/2 lymph nodes negative, ER 95%, PR 80%, HER-2 2+ equivocal, T2 N0   Treatment plan: 1.  Adjuvant Kadcyla  started 05/24/2019-  12/12/19 2.  Adjuvant antiestrogen therapy with anastrozole  1 mg p.o. daily x7 years started 05/24/2019  ----------------------------------------------------------------------------------------------------------------------------- Low back pain with pain radiating down the leg: On Percocets 09/24/2019: MRI lumbar spine: Negative for metastatic disease.  Mild degenerative changes without disc protrusion or stenosis 09/24/2019: Bone scan: Nonspecific uptake right 11th rib   Anastrozole  toxicities: Denies any hot flashes or myalgias. Recommend continuation of anastrozole  for 7 years Shingles: 12/21/19 Completed Valtrex    Breast cancer surveillance: 1. left breast MRI  01/10/2020: No MRI evidence of malignancy in the left breast, status post right mastectomy. 2. breast exam 07/14/2023: Benign 3.  Mammogram left breast  07/09/2022 benign breast density category C   CT chest lung cancer screening 03/11/2023: Negative Brain MRI 02/19/2020: Multiple foci of chronic demyelinating plaque associated with multiple sclerosis CT CAP 11/16/2023: 1.1 cm hemangioma liver, prominent retroperitoneal and mesenteric lymph nodes nonspecific favored reactive, no evidence of metastatic disease   Return to clinic in 1 year for follow-up ------------------------------------- Assessment and Plan Assessment & Plan Estrogen receptor positive right breast cancer, status post mastectomy, on adjuvant anastrozole  therapy She has been on anastrozole  for five years post-mastectomy for a 3.9 cm estrogen receptor positive tumor. Tolerating well. Recommended continuation for two more years to reduce recurrence risk. - Continue anastrozole  for two more years. - [facility]      No orders of the defined types were placed in this encounter.  The patient has a good understanding of the overall plan. she agrees with it. she will call with any problems that may develop before the next visit  here. Total time spent: 30 mins including face to face time and time spent for planning, charting and co-ordination of care   Naomi MARLA Chad, MD 07/16/24

## 2024-07-16 NOTE — Assessment & Plan Note (Signed)
 11/21/2018:Screening detected right breast mass upper outer quadrant, in addition to areas of calcifications which were not biopsied.  By ultrasound she had 2 breast masses 12 o'clock position 2.9 cm: Grade 2 IDC with high-grade DCIS ER 100%, PR 70%, Ki-67 10%, HER-2 3+ by IHC and 11 o'clock position 1 cm, ER 90%, PR 50%, Ki-67 15%, HER-2 3+ by IHC, T2N0 stage Ib   Neoadjuvant chemotherapy with TCH Perjeta  x6 cycles 12/12/2018-03/27/2019   04/30/2019: Right mastectomy: Grade 2 IDC, 3.9 cm, high-grade DCIS, margins are negative, 0/2 lymph nodes negative, ER 95%, PR 80%, HER-2 2+ equivocal, T2 N0   Treatment plan: 1.  Adjuvant Kadcyla  started 05/24/2019- 12/12/19 2.  Adjuvant antiestrogen therapy with anastrozole  1 mg p.o. daily x7 years started 05/24/2019  ----------------------------------------------------------------------------------------------------------------------------- Low back pain with pain radiating down the leg: On Percocets 09/24/2019: MRI lumbar spine: Negative for metastatic disease.  Mild degenerative changes without disc protrusion or stenosis 09/24/2019: Bone scan: Nonspecific uptake right 11th rib   Anastrozole  toxicities: Denies any hot flashes or myalgias. Shingles: 12/21/19 Completed Valtrex    Breast cancer surveillance: 1. left breast MRI  01/10/2020: No MRI evidence of malignancy in the left breast, status post right mastectomy. 2. breast exam 07/14/2023: Benign 3.  Mammogram left breast  07/09/2022 benign breast density category C   CT chest lung cancer screening 03/11/2023: Negative Brain MRI 02/19/2020: Multiple foci of chronic demyelinating plaque associated with multiple sclerosis CT CAP 11/16/2023: 1.1 cm hemangioma liver, prominent retroperitoneal and mesenteric lymph nodes nonspecific favored reactive, no evidence of metastatic disease   Return to clinic in 1 year for follow-up

## 2024-07-17 ENCOUNTER — Ambulatory Visit: Payer: Self-pay | Admitting: Student

## 2024-07-17 ENCOUNTER — Encounter: Payer: Self-pay | Admitting: Student

## 2024-07-17 VITALS — BP 131/66 | HR 81 | Temp 98.2°F | Ht 62.0 in | Wt 112.0 lb

## 2024-07-17 DIAGNOSIS — T148XXA Other injury of unspecified body region, initial encounter: Secondary | ICD-10-CM

## 2024-07-17 DIAGNOSIS — F17210 Nicotine dependence, cigarettes, uncomplicated: Secondary | ICD-10-CM | POA: Diagnosis not present

## 2024-07-17 MED ORDER — VARENICLINE TARTRATE 0.5 MG PO TABS: ORAL_TABLET | ORAL | 0 refills | Status: AC

## 2024-07-17 NOTE — Assessment & Plan Note (Signed)
 Patient endorses desire to quit smoking.  Wants Chantix .  Plan: - Chantix  provided

## 2024-07-17 NOTE — Patient Instructions (Addendum)
 Debbie Watts Dearl you for allowing me to take part in your care today.  Here are your instructions.  1.  Regarding smoking sensation.  I wrote you for Chantix .  Please take it on the schedule below. Days 1-3: 0.5 mg daily Day 4-7: 0.5 mg twice daily Day 8-end of treatment: 1 mg twice daily - I wrote for 0.5 mg tablets for you to start. Please set a stop date and start Chantix  now.  2.  No need for antibiotics for your arm wounds.  They should heal on their own.  If you develop any fevers or chills, please call the clinic and we will send in antibiotics.  3.  Please come back in 1 month and we can do a physical   PLEASE BRING YOUR MEDICATIONS TO EVERY APPOINTMENT  Thank you, Dr. Tobie  If you have any other questions please contact the internal medicine clinic at [phone] If it is after hours, please call the [facility] at [phone] and then ask the person who picks up for the resident on call.

## 2024-07-17 NOTE — Progress Notes (Signed)
 CC: Animal scratch  HPI:  [name] is a 63 y.o. female with a past medical history of asthma, who presents for acute visit concerning animal scratch.  Please see assessment and plan for full HPI.  Past Medical History:  Diagnosis Date   Acute cystitis without hematuria 03/16/2018   Arthritis    Asthma    Cancer (HCC)    chemo for breast ca  x11mos   Depression    Deviated nasal septum    Gait disturbance 01/12/2018   Hearing loss 06/11/2014   Bilateral   Herniated cervical disc    Hypertension    Leukocytosis 01/19/2019   Migraine    Miscarriage    2   MS (multiple sclerosis)    Restless leg syndrome 06/23/2014   Shingles 12/24/2019   Sleep disturbance 07/10/2020   Sore throat 10/29/2021   Syncope 01/19/2019   Urinary tract infection without hematuria 08/10/2018   UTI (urinary tract infection), uncomplicated 06/09/2020   Wrist pain, acute, left 12/23/2021     Current Outpatient Medications:    anastrozole  (ARIMIDEX ) 1 MG tablet, Take 1 tablet (1 mg total) by mouth daily., Disp: 90 tablet, Rfl: 3   aspirin -acetaminophen -caffeine  (EXCEDRIN  MIGRAINE) 250-250-65 MG tablet, Take 2 tablets by mouth every 6 (six) hours as needed for migraine., Disp: , Rfl:    carbidopa -levodopa  (SINEMET  IR) 10-100 MG tablet, TAKE 1 TABLET BY MOUTH EVERYDAY AT BEDTIME, Disp: 90 tablet, Rfl: 1   cyclobenzaprine  (FLEXERIL ) 5 MG tablet, TAKE 1 TABLET BY MOUTH 2 TIMES DAILY AS NEEDED FOR MUSCLE SPASMS., Disp: 60 tablet, Rfl: 4   Dimethyl Fumarate  240 MG CPDR, One po bid, Disp: 60 capsule, Rfl: 11   fluticasone  (FLONASE ) 50 MCG/ACT nasal spray, PLACE 2 SPRAYS INTO BOTH NOSTRILS AT BEDTIME., Disp: 48 mL, Rfl: 2   gabapentin  (NEURONTIN ) 300 MG capsule, TAKE 2 CAPSULES BY MOUTH TWICE A DAY, Disp: 360 capsule, Rfl: 0   lisinopril  (ZESTRIL ) 10 MG tablet, TAKE 1 TABLET BY MOUTH EVERY DAY, Disp: 90 tablet, Rfl: 3   Multiple Vitamin (MULTIVITAMIN WITH MINERALS) TABS tablet, Take 2 tablets by  mouth daily., Disp: , Rfl:    omeprazole  (PRILOSEC  OTC) 20 MG tablet, Take 20 mg by mouth daily as needed (acid reflux)., Disp: , Rfl:    oxybutynin  (DITROPAN  XL) 10 MG 24 hr tablet, Take 1 tablet (10 mg total) by mouth at bedtime., Disp: 90 tablet, Rfl: 3   PARoxetine  (PAXIL ) 20 MG tablet, TAKE 1 TABLET BY MOUTH EVERY DAY, Disp: 90 tablet, Rfl: 1   Tiotropium Bromide  Monohydrate (SPIRIVA  RESPIMAT) 2.5 MCG/ACT AERS, Inhale 2 puffs into the lungs daily., Disp: 4 g, Rfl: 3   traMADol  (ULTRAM ) 50 MG tablet, Take 1 tablet (50 mg total) by mouth 2 (two) times daily as needed., Disp: 60 tablet, Rfl: 5  Review of Systems:    Constitutional: Patient denies any fevers or chills Skin: Patient endorses skin tears  Physical Exam:  Vitals:   07/17/24 1540  BP: 131/66  Pulse: 81  Temp: 98.2 F (36.8 C)  TempSrc: Oral  SpO2: 97%  Weight: 112 lb (50.8 kg)  Height: 5' 2 (1.575 m)   General: Patient is sitting comfortably in the room  Head: Normocephalic, atraumatic  Skin: Right dorsal forearm with multiple skin tears appreciated with active bleeding.  No deep lacerations appreciated.  No obvious signs of infection.  No purulent drainage.    Assessment & Plan:   Assessment & Plan Animal scratch Patient presents today  with 1 day history of animal scratch.  Patient has a 25 pound dog who was excited to see her yesterday, and scratched her on her right upper forearm.  Patient presents today with concerns of bleeding.  On evaluation, no obvious signs of infection.  It is actively bleeding.  Recommend wrapping.  No indication for antibiotics at this time.  Patient given instructions about noticing drainage that is purulent, fevers, or chills to call back for further evaluation.  Plan: - Conservative management with wrapping - Patient counseled on signs of infection Cigarette nicotine dependence without complication Patient endorses desire to quit smoking.  Wants Chantix .  Plan: - Chantix   provided   Patient discussed with Dr. Rosan Libby Blanch, DO Internal Medicine Resident PGY-3

## 2024-07-17 NOTE — Progress Notes (Deleted)
 CC: ***  HPI: [name] is a 63 y.o. female living with a history stated below and presents today for ***. Please see problem based assessment and plan for additional details.  Past Medical History:  Diagnosis Date   Acute cystitis without hematuria 03/16/2018   Arthritis    Asthma    Cancer (HCC)    chemo for breast ca  x57mos   Depression    Deviated nasal septum    Gait disturbance 01/12/2018   Hearing loss 06/11/2014   Bilateral   Herniated cervical disc    Hypertension    Leukocytosis 01/19/2019   Migraine    Miscarriage    2   MS (multiple sclerosis)    Restless leg syndrome 06/23/2014   Shingles 12/24/2019   Sleep disturbance 07/10/2020   Sore throat 10/29/2021   Syncope 01/19/2019   Urinary tract infection without hematuria 08/10/2018   UTI (urinary tract infection), uncomplicated 06/09/2020   Wrist pain, acute, left 12/23/2021    Current Outpatient Medications on File Prior to Visit  Medication Sig Dispense Refill   anastrozole  (ARIMIDEX ) 1 MG tablet Take 1 tablet (1 mg total) by mouth daily. 90 tablet 3   aspirin -acetaminophen -caffeine  (EXCEDRIN  MIGRAINE) 250-250-65 MG tablet Take 2 tablets by mouth every 6 (six) hours as needed for migraine.     carbidopa -levodopa  (SINEMET  IR) 10-100 MG tablet TAKE 1 TABLET BY MOUTH EVERYDAY AT BEDTIME 90 tablet 1   cyclobenzaprine  (FLEXERIL ) 5 MG tablet TAKE 1 TABLET BY MOUTH 2 TIMES DAILY AS NEEDED FOR MUSCLE SPASMS. 60 tablet 4   Dimethyl Fumarate  240 MG CPDR One po bid 60 capsule 11   fluticasone  (FLONASE ) 50 MCG/ACT nasal spray PLACE 2 SPRAYS INTO BOTH NOSTRILS AT BEDTIME. 48 mL 2   gabapentin  (NEURONTIN ) 300 MG capsule TAKE 2 CAPSULES BY MOUTH TWICE A DAY 360 capsule 0   lisinopril  (ZESTRIL ) 10 MG tablet TAKE 1 TABLET BY MOUTH EVERY DAY 90 tablet 3   Multiple Vitamin (MULTIVITAMIN WITH MINERALS) TABS tablet Take 2 tablets by mouth daily.     omeprazole  (PRILOSEC  OTC) 20 MG tablet Take 20 mg by mouth daily as  needed (acid reflux).     oxybutynin  (DITROPAN  XL) 10 MG 24 hr tablet Take 1 tablet (10 mg total) by mouth at bedtime. 90 tablet 3   PARoxetine  (PAXIL ) 20 MG tablet TAKE 1 TABLET BY MOUTH EVERY DAY 90 tablet 1   Tiotropium Bromide  Monohydrate (SPIRIVA  RESPIMAT) 2.5 MCG/ACT AERS Inhale 2 puffs into the lungs daily. 4 g 3   traMADol  (ULTRAM ) 50 MG tablet Take 1 tablet (50 mg total) by mouth 2 (two) times daily as needed. 60 tablet 5   [DISCONTINUED] prochlorperazine  (COMPAZINE ) 10 MG tablet TAKE 1 TABLET (10 MG TOTAL) BY MOUTH EVERY 6 (SIX) HOURS AS NEEDED (NAUSEA OR VOMITING). 30 tablet 1   No current facility-administered medications on file prior to visit.    Family History  Adopted: Yes  Problem Relation Age of Onset   Cancer Maternal Uncle        throat    Social History   Socioeconomic History   Marital status: Single    Spouse name: Not on file   Number of children: 2   Years of education: 12   Highest education level: Not on file  Occupational History   Occupation: Disabled  Tobacco Use   Smoking status: Every Day    Current packs/day: 1.00    Average packs/day: 1 pack/day for 44.0 years (44.0  ttl pk-yrs)    Types: Cigarettes   Smokeless tobacco: Never   Tobacco comments:    1 pk per day  Vaping Use   Vaping status: Former  Substance and Sexual Activity   Alcohol use: Yes    Comment: 0-5 beers daily   Drug use: Yes    Types: Marijuana   Sexual activity: Not Currently    Partners: Male  Other Topics Concern   Not on file  Social History Narrative   Patient is single with 2 children.   Patient is right handed.   Current Social History 08/10/2018        Right handed       Patient lives with significant other Bronwen Chary) in one level Townhome 08/10/2018    Transportation: Patient has own vehicle  08/10/2018   Important Relationships Chyrl Chary 08/10/2018    Pets: one dog named Browser 08/10/2018   Education / Work:  12th grade/ Disabled 08/10/2018    Interests / Fun: Watching baseball and Nascar 08/10/2018   Current Stressors: Significant other is depressed over his poor health 08/10/2018   Religious / Personal Beliefs: I believe in God 08/10/2018   Other: I had a miscarriage before I had my 2 sons. 08/10/2018   L. Jori, RN, BSN                                                                                                 Social Drivers of Health   Financial Resource Strain: Low Risk  (04/11/2024)   Overall Financial Resource Strain (CARDIA)    Difficulty of Paying Living Expenses: Not very hard  Food Insecurity: No Food Insecurity (04/11/2024)   Hunger Vital Sign    Worried About Running Out of Food in the Last Year: Never true    Ran Out of Food in the Last Year: Never true  Transportation Needs: No Transportation Needs (04/11/2024)   PRAPARE - Administrator, Civil Service (Medical): No    Lack of Transportation (Non-Medical): No  Physical Activity: Inactive (04/11/2024)   Exercise Vital Sign    Days of Exercise per Week: 0 days    Minutes of Exercise per Session: 0 min  Stress: Stress Concern Present (04/11/2024)   [employer] of Occupational Health - Occupational Stress Questionnaire    Feeling of Stress: To some extent  Social Connections: Moderately Isolated (04/11/2024)   Social Connection and Isolation Panel    Frequency of Communication with Friends and Family: Once a week    Frequency of Social Gatherings with Friends and Family: Never    Attends Religious Services: More than 4 times per year    Active Member of [employer] or Organizations: No    Attends Banker Meetings: Never    Marital Status: Living with partner  Intimate Partner Violence: Not At Risk (04/11/2024)   Humiliation, Afraid, Rape, and Kick questionnaire    Fear of Current or Ex-Partner: No    Emotionally Abused: No    Physically Abused: No    Sexually Abused: No    Review of Systems: ROS negative except  for what is  noted on the assessment and plan.  There were no vitals filed for this visit.  Physical Exam  Physical Exam: Constitutional: well-appearing *** sitting in ***, in no acute distress HENT: normocephalic atraumatic, mucous membranes moist Eyes: conjunctiva non-erythematous Neck: supple Cardiovascular: regular rate and rhythm, no m/r/g Pulmonary/Chest: normal work of breathing on room air, lungs clear to auscultation bilaterally Abdominal: soft, non-tender, non-distended MSK: *** Neurological: alert & oriented x 3, 5/5 strength in bilateral upper and lower extremities, normal gait Skin: warm and dry Psych: ***  Assessment & Plan:   No problem-specific Assessment & Plan notes found for this encounter. PMH: Migraine, HTN, COPD, multiple sclerosis, chronic back pain, right breast cancer  Meds Anastrozole  1 mg Excedrin  as needed Sinemet  nightly Flexeril  5 mg twice daily as needed Flonase  Gabapentin  600 mg twice daily Lisinopril  10 mg Prilosec  20 mg as needed Oxybutynin  10 mg nightly Paroxetine  20 mg daily Spiriva  2 puffs daily Tramadol  50 mg twice daily as needed  Tobacco use Discussion for Chantix   COPD on Spiriva  and as needed albuterol  neb  HTN on lisinopril  10 mg BMP    Breast cancer on anastrozole  therapy following mastectomy  Patient {[id number]::discussed with,seen with} Dr. {[id number]::Tpoopjfd,Z. Hoffman,Winfrey,Narendra,Chun,Chambliss,Lau,Machen}  Debbie Watts, D.O. [facility] Internal Medicine, PGY-3 Phone: [phone] Date 07/17/2024 Time 8:15 AM

## 2024-07-20 ENCOUNTER — Encounter: Payer: Self-pay | Admitting: Hematology and Oncology

## 2024-07-23 NOTE — Progress Notes (Signed)
 Internal Medicine Clinic Attending  Case discussed with the resident at the time of the visit.  We reviewed the resident's history and exam and pertinent patient test results.  I agree with the assessment, diagnosis, and plan of care documented in the resident's note.

## 2024-08-06 ENCOUNTER — Telehealth: Payer: Self-pay | Admitting: Neurology

## 2024-08-06 MED ORDER — DIMETHYL FUMARATE 240 MG PO CPDR: DELAYED_RELEASE_CAPSULE | ORAL | 11 refills | Status: AC

## 2024-08-06 NOTE — Telephone Encounter (Signed)
 refilled

## 2024-08-06 NOTE — Telephone Encounter (Signed)
 Pt is requesting a refill for Dimethyl Fumarate 240 MG CPDR .  Pharmacy: [facility]

## 2024-08-07 NOTE — Patient Instructions (Signed)

## 2024-08-07 NOTE — Progress Notes (Unsigned)
 No chief complaint on file.     HISTORY OF PRESENT ILLNESS:  08/07/24 ALL:  Debbie Watts is a 63 y.o. female here today for follow up for RRMS. She continues dimethyl fumerate but alternates twice daily with daily dosing due to low lymphocyte count. Lymph 0.8 12/2023.   She is doing fairly well. No new or exacerbating symptoms with the exception of mild achy knee pain for the past 2-3 days. She denies known injuries. She denies signs of infection. She feels gait is stable. She doesn't normally need an assistive device but will use a cane if tired. She did have a fall about 4 weeks ago. She tripped over some edging on the ground and fell on her left knee. She denies injury and did not have any significant knee pain following fall. Low back bothers her but she feels it is about the same from last visit. Tramadol  helps some days. She has had some right knee pain for the past 2-3 days and reports that Tramadol  has not helped.   Gabapentin  600mg  BID and Sinemet  at bedtime help RLS symptoms. She feels that she sleeps fine but sometimes has interrupted sleep due to having to care for her boyfriend. He is a double amputee and needs her assistance for mobility.   Mood is stable on Paxil  20mg  daily. She is followed closely by PCP and oncology.    HISTORY (copied from Dr Duncan previous note)  Debbie Watts is a 63 y.o. woman with relapsing remitting multiple sclerosis.   Update 02-28-62 She is on DMF and tolerates it well (gets from [facility]).    Last lymphocyte count was 0.6 and we reduced her dose.     She feels gait is stable.   She does a little worse in heat.   She can walk a mile but then needs a rest due to LBP, COPD and fatigue     As she walks longer, her back hurts more.    Her right leg is weaker than her left and more clumsy.   Arms are more symmetric.   She has dysesthesias in her legs/feet.       She has urinary urgency and rare urge incontinence   Vision is fine.          She also has LBP, worse if active or if she stands up 20 minutes.  Increases when rises from a chair, bed or a floor.  Lumbar MRI 2020 showed DJD but no spinal stenosis.     Headaches are much better the past couple years.     She takes Excedron migraine when one occurs.  She has frequent headaches arising form the left occiput.    Mood is doing better.   She notes some fatigue.  She sleeps ok most nights but some sleep maintenance insomnia.   She is on Sinemet  at bedtme only for RLS.     She was diagnosed with breast cancer January 2020.   She did TCHP (docetaxel , carboplatin , trastuzumab , pertuzumab  (Perjeta )).   She has had 5 cycles of chemotherapy.   She is on anastrozole  now.    She sees  Dr. Gudena twice a year.       MS history:  She was diagnosed with MS in 1999 based on MRI and lumbar rupture.  At that time she was living in [place] and was seeing Dr. Zweibel.  When he retired she saw another doctor and then she moved to this area around 2015 but  did not seek neurologic referral until 2015.  She was on Avonex for many years but stopped due to insurance.  Then, in November 2016, she started in the drug study and was on ALKS [id number] 4 2 years.  The study ended in late 2018 for her and she switched over to [employer] .       Images: MRI brain showed multiple T2/FLAIR hyperintense foci in the hemispheres in a pattern and configuration consistent with chronic demyelinating plaque associated with multiple sclerosis.  None of the foci appear to be acute.  They do not enhance.  Compared to the MRI dated 08/09/2017, there are no new lesions.   MRI cervical spine showed 3 T2 hyperintense foci within the spinal cord adjacent to C2, C4-C5 and C6-C7.  All of these were present on the 2015 MRI.  They do not enhance.  They are consistent with chronic demyelinating plaque associated with multiple sclerosis.   Moderate spinal stenosis at C5-C6 and mild spinal stenosis at C4-C5 and C6-C7.  There does not appear to  be any nerve root compression at these levels.  Degenerative changes at C5-C6 have mildly progressed compared to the 2015 MRI.   MRI brain 02/19/2020  T2/FLAIR hyperintense foci in the hemispheres in a pattern and configuration consistent with chronic demyelinating plaque associated with multiple sclerosis.  None of the foci appear to be acute.  They do not enhance.  Compared to the MRI dated 08/09/2017, there are no new lesions   MRI lumbar 09/24/2019 showed  mild DJD but no spinal stenosis.  No    REVIEW OF SYSTEMS: Out of a complete 14 system review of symptoms, the patient complains only of the following symptoms, knee pain, low back pain and all other reviewed systems are negative.   ALLERGIES: Allergies  Allergen Reactions   Procaine Shortness Of Breath     HOME MEDICATIONS: Outpatient Medications Prior to Visit  Medication Sig Dispense Refill   anastrozole  (ARIMIDEX ) 1 MG tablet Take 1 tablet (1 mg total) by mouth daily. 90 tablet 3   aspirin -acetaminophen -caffeine  (EXCEDRIN  MIGRAINE) 250-250-65 MG tablet Take 2 tablets by mouth every 6 (six) hours as needed for migraine.     carbidopa -levodopa  (SINEMET  IR) 10-100 MG tablet TAKE 1 TABLET BY MOUTH EVERYDAY AT BEDTIME 90 tablet 1   cyclobenzaprine  (FLEXERIL ) 5 MG tablet TAKE 1 TABLET BY MOUTH 2 TIMES DAILY AS NEEDED FOR MUSCLE SPASMS. 60 tablet 4   Dimethyl Fumarate  240 MG CPDR One po bid 60 capsule 11   fluticasone  (FLONASE ) 50 MCG/ACT nasal spray PLACE 2 SPRAYS INTO BOTH NOSTRILS AT BEDTIME. 48 mL 2   gabapentin  (NEURONTIN ) 300 MG capsule TAKE 2 CAPSULES BY MOUTH TWICE A DAY 360 capsule 0   lisinopril  (ZESTRIL ) 10 MG tablet TAKE 1 TABLET BY MOUTH EVERY DAY 90 tablet 3   Multiple Vitamin (MULTIVITAMIN WITH MINERALS) TABS tablet Take 2 tablets by mouth daily.     omeprazole  (PRILOSEC  OTC) 20 MG tablet Take 20 mg by mouth daily as needed (acid reflux).     oxybutynin  (DITROPAN  XL) 10 MG 24 hr tablet Take 1 tablet (10 mg total) by  mouth at bedtime. 90 tablet 3   PARoxetine  (PAXIL ) 20 MG tablet TAKE 1 TABLET BY MOUTH EVERY DAY 90 tablet 1   Tiotropium Bromide  Monohydrate (SPIRIVA  RESPIMAT) 2.5 MCG/ACT AERS Inhale 2 puffs into the lungs daily. 4 g 3   traMADol  (ULTRAM ) 50 MG tablet Take 1 tablet (50 mg total) by mouth 2 (two) times daily  as needed. 60 tablet 5   varenicline  (CHANTIX ) 0.5 MG tablet Take 1 tablet (0.5 mg total) by mouth daily for 3 days, THEN 1 tablet (0.5 mg total) 2 (two) times daily for 4 days, THEN 2 tablets (1 mg total) 2 (two) times daily. 319 tablet 0   No facility-administered medications prior to visit.     PAST MEDICAL HISTORY: Past Medical History:  Diagnosis Date   Acute cystitis without hematuria 03/16/2018   Arthritis    Asthma    Cancer (HCC)    chemo for breast ca  [id number]   Depression    Deviated nasal septum    Gait disturbance 01/12/2018   Hearing loss 06/11/2014   Bilateral   Herniated cervical disc    Hypertension    Leukocytosis 01/19/2019   Migraine    Miscarriage    2   MS (multiple sclerosis)    Restless leg syndrome 06/23/2014   Shingles 12/24/2019   Sleep disturbance 07/10/2020   Sore throat 10/29/2021   Syncope 01/19/2019   Urinary tract infection without hematuria 08/10/2018   UTI (urinary tract infection), uncomplicated 06/09/2020   Wrist pain, acute, left 12/23/2021     PAST SURGICAL HISTORY: Past Surgical History:  Procedure Laterality Date   BREAST BIOPSY Right 11/21/2018   Malignant   HEMATOMA EVACUATION Right 05/01/2019   Procedure: EVACUATION HEMATOMA;  Surgeon: Ebbie Cough, MD;  Location: [facility] OR;  Service: General;  Laterality: Right;   MASTECTOMY Right 04/30/2019   MASTECTOMY W/ SENTINEL NODE BIOPSY Right 04/30/2019   Procedure: RIGHT MASTECTOMY WITH  RIGHT AXILLARY SENTINEL LYMPH NODE BIOPSY INJECT BLUE DYE;  Surgeon: Ebbie Cough, MD;  Location: [facility] OR;  Service: General;  Laterality: Right;   NASAL SEPTUM SURGERY     SIMPLE  MASTECTOMY WITH AXILLARY SENTINEL NODE BIOPSY Right 04/30/2019   spinal injections       FAMILY HISTORY: Family History  Adopted: Yes  Problem Relation Age of Onset   Cancer Maternal Uncle        throat     SOCIAL HISTORY: Social History   Socioeconomic History   Marital status: Single    Spouse name: Not on file   Number of children: 2   Years of education: 12   Highest education level: Not on file  Occupational History   Occupation: Disabled  Tobacco Use   Smoking status: Every Day    Current packs/day: 1.00    Average packs/day: 1 pack/day for 44.0 years (44.0 ttl pk-yrs)    Types: Cigarettes   Smokeless tobacco: Never   Tobacco comments:    1 pk per day  Vaping Use   Vaping status: Former  Substance and Sexual Activity   Alcohol use: Yes    Comment: 0-5 beers daily   Drug use: Yes    Types: Marijuana   Sexual activity: Not Currently    Partners: Male  Other Topics Concern   Not on file  Social History Narrative   Patient is single with 2 children.   Patient is right handed.   Current Social History 08/10/2018        Right handed       Patient lives with significant other Bronwen Chary) in one level Townhome 08/10/2018    Transportation: Patient has own vehicle  08/10/2018   Important Relationships Chyrl Chary 08/10/2018    Pets: one dog named Browser 08/10/2018   Education / Work:  12th grade/ Disabled 08/10/2018   Interests / Fun: Watching baseball and  Nascar 08/10/2018   Current Stressors: Significant other is depressed over his poor health 08/10/2018   Religious / Personal Beliefs: I believe in God 08/10/2018   Other: I had a miscarriage before I had my 2 sons. 08/10/2018   L. Jori, RN, BSN                                                                                                 Social Drivers of Health   Financial Resource Strain: Low Risk  (04/11/2024)   Overall Financial Resource Strain (CARDIA)    Difficulty of Paying Living  Expenses: Not very hard  Food Insecurity: No Food Insecurity (04/11/2024)   Hunger Vital Sign    Worried About Running Out of Food in the Last Year: Never true    Ran Out of Food in the Last Year: Never true  Transportation Needs: No Transportation Needs (04/11/2024)   PRAPARE - Administrator, Civil Service (Medical): No    Lack of Transportation (Non-Medical): No  Physical Activity: Inactive (04/11/2024)   Exercise Vital Sign    Days of Exercise per Week: 0 days    Minutes of Exercise per Session: 0 min  Stress: Stress Concern Present (04/11/2024)   [employer] of Occupational Health - Occupational Stress Questionnaire    Feeling of Stress: To some extent  Social Connections: Moderately Isolated (04/11/2024)   Social Connection and Isolation Panel    Frequency of Communication with Friends and Family: Once a week    Frequency of Social Gatherings with Friends and Family: Never    Attends Religious Services: More than 4 times per year    Active Member of [employer] or Organizations: No    Attends Banker Meetings: Never    Marital Status: Living with partner  Intimate Partner Violence: Not At Risk (04/11/2024)   Humiliation, Afraid, Rape, and Kick questionnaire    Fear of Current or Ex-Partner: No    Emotionally Abused: No    Physically Abused: No    Sexually Abused: No     PHYSICAL EXAM  There were no vitals filed for this visit.  There is no height or weight on file to calculate BMI.  Generalized: Well developed, in no acute distress  Cardiology: normal rate and rhythm, no murmur auscultated  Respiratory: clear to auscultation bilaterally    Neurological examination  Mentation: Alert oriented to time, place, history taking. Follows all commands speech and language fluent Cranial nerve II-XII: Pupils were equal round reactive to light. Extraocular movements were full, visual field were full on confrontational test. Facial sensation and strength were  normal. Head turning and shoulder shrug  were normal and symmetric. Motor: The motor testing reveals 5 over 5 strength of all 4 extremities with exception of 4-4+/5 right hip flexion. Good symmetric motor tone is noted throughout.  Sensory: Sensory testing is intact to soft touch on all 4 extremities. No evidence of extinction is noted.  Coordination: Cerebellar testing reveals good finger-nose-finger and reduced right heel-to-shin bilaterally.  Gait and station: Gait is mildly wide.  Reflexes: Deep tendon reflexes are brisk  in lower but symmetric bilaterally.  MSK: no obvious edema or deformity noted of right knee. Tenderness with palpation of medial aspect noted. No obvious effusion. No signs of infection.    DIAGNOSTIC DATA (LABS, IMAGING, TESTING) - I reviewed patient records, labs, notes, testing and imaging myself where available.  Lab Results  Component Value Date   WBC 4.1 01/23/2024   HGB 13.7 01/23/2024   HCT 40.2 01/23/2024   MCV 94 01/23/2024   PLT 270 01/23/2024      Component Value Date/Time   NA 139 09/01/2023 1429   K 4.5 09/01/2023 1429   CL 100 09/01/2023 1429   CO2 25 09/01/2023 1429   GLUCOSE 85 09/01/2023 1429   GLUCOSE 93 12/12/2019 1330   BUN 9 09/01/2023 1429   CREATININE 0.90 11/15/2023 1711   CREATININE 0.68 12/12/2019 1330   CALCIUM 8.7 09/01/2023 1429   PROT 6.3 09/01/2023 1429   ALBUMIN 4.3 09/01/2023 1429   AST 21 09/01/2023 1429   AST 35 12/12/2019 1330   ALT 22 09/01/2023 1429   ALT 44 12/12/2019 1330   ALKPHOS 91 09/01/2023 1429   BILITOT 0.3 09/01/2023 1429   BILITOT 0.4 12/12/2019 1330   GFRNONAA 97 06/02/2020 1422   GFRNONAA >60 12/12/2019 1330   GFRAA 112 06/02/2020 1422   GFRAA >60 12/12/2019 1330   Lab Results  Component Value Date   CHOL 127 06/02/2020   HDL 55 06/02/2020   LDLCALC 56 06/02/2020   TRIG 82 06/02/2020   CHOLHDL 2.3 06/02/2020   Lab Results  Component Value Date   HGBA1C 5.3 12/07/2017   Lab Results   Component Value Date   VITAMINB12 586 07/02/2015   No results found for: TSH      No data to display               No data to display           ASSESSMENT AND PLAN  63 y.o. year old female  has a past medical history of Acute cystitis without hematuria (03/16/2018), Arthritis, Asthma, Cancer (HCC), Depression, Deviated nasal septum, Gait disturbance (01/12/2018), Hearing loss (06/11/2014), Herniated cervical disc, Hypertension, Leukocytosis (01/19/2019), Migraine, Miscarriage, MS (multiple sclerosis), Restless leg syndrome (06/23/2014), Shingles (12/24/2019), Sleep disturbance (07/10/2020), Sore throat (10/29/2021), Syncope (01/19/2019), Urinary tract infection without hematuria (08/10/2018), UTI (urinary tract infection), uncomplicated (06/09/2020), and Wrist pain, acute, left (12/23/2021). here with    No diagnosis found.  Sarahjane is doing fairly well. We will continue dimethyl fumerate, alternating daily and twice daily dosing. I will update labs, today. She will continue Tramadol  as needed for generalized pain. She declines refill today stating she has about 20 tablets left. Last refill 08/21/2021. PDMP shows refills approx every 6 weeks. She will continue gabapentin  and carb/levo for RLS. I will give her meloxicam  7.5 and advised 1-2 tablets daily as needed. If knee pain persists she will call PCP for formal eval. She will continue to monitor back [pain. If worsens we can do lumbar MRI per Dr Duncan recommendation. Healthy lifestyle habits encouraged. Fall precautions advised. She will follow up with Dr Vear in 6 months.    No orders of the defined types were placed in this encounter.     No orders of the defined types were placed in this encounter.      Greig Forbes, MSN, FNP-C 08/07/2024, 1:05 PM  [facility] [address] [place], [place] [address] [phone]

## 2024-08-08 ENCOUNTER — Encounter: Payer: Self-pay | Admitting: Family Medicine

## 2024-08-08 ENCOUNTER — Ambulatory Visit: Admitting: Family Medicine

## 2024-08-08 VITALS — BP 112/78 | HR 100 | Ht 62.0 in | Wt 110.0 lb

## 2024-08-08 DIAGNOSIS — G8929 Other chronic pain: Secondary | ICD-10-CM | POA: Diagnosis not present

## 2024-08-08 DIAGNOSIS — M546 Pain in thoracic spine: Secondary | ICD-10-CM

## 2024-08-08 DIAGNOSIS — R269 Unspecified abnormalities of gait and mobility: Secondary | ICD-10-CM

## 2024-08-08 DIAGNOSIS — Z79899 Other long term (current) drug therapy: Secondary | ICD-10-CM | POA: Diagnosis not present

## 2024-08-08 DIAGNOSIS — G2581 Restless legs syndrome: Secondary | ICD-10-CM | POA: Diagnosis not present

## 2024-08-08 DIAGNOSIS — G35D Multiple sclerosis, unspecified: Secondary | ICD-10-CM | POA: Diagnosis not present

## 2024-08-08 MED ORDER — GABAPENTIN 300 MG PO CAPS
600.0000 mg | ORAL_CAPSULE | Freq: Two times a day (BID) | ORAL | 1 refills | Status: AC
Start: 2024-08-08 — End: ?

## 2024-08-08 MED ORDER — TRAMADOL HCL 50 MG PO TABS: 50.0000 mg | ORAL_TABLET | Freq: Two times a day (BID) | ORAL | 5 refills | Status: AC | PRN

## 2024-08-08 MED ORDER — CARBIDOPA-LEVODOPA 10-100 MG PO TABS: ORAL_TABLET | ORAL | 1 refills | Status: AC

## 2024-08-08 MED ORDER — OXYBUTYNIN CHLORIDE ER 10 MG PO TB24: 10.0000 mg | ORAL_TABLET | Freq: Every day | ORAL | 3 refills | Status: AC

## 2024-08-09 ENCOUNTER — Ambulatory Visit: Payer: Self-pay | Admitting: Family Medicine

## 2024-08-09 LAB — CBC WITH DIFFERENTIAL/PLATELET
Basophils Absolute: 0 x10E3/uL (ref 0.0–0.2)
Basos: 1 %
EOS (ABSOLUTE): 0 x10E3/uL (ref 0.0–0.4)
Eos: 1 %
Hematocrit: 42.3 % (ref 34.0–46.6)
Hemoglobin: 14.1 g/dL (ref 11.1–15.9)
Immature Grans (Abs): 0 x10E3/uL (ref 0.0–0.1)
Immature Granulocytes: 0 %
Lymphocytes Absolute: 0.7 x10E3/uL (ref 0.7–3.1)
Lymphs: 15 %
MCH: 31.9 pg (ref 26.6–33.0)
MCHC: 33.3 g/dL (ref 31.5–35.7)
MCV: 96 fL (ref 79–97)
Monocytes Absolute: 0.6 x10E3/uL (ref 0.1–0.9)
Monocytes: 12 %
Neutrophils Absolute: 3.4 x10E3/uL (ref 1.4–7.0)
Neutrophils: 71 %
Platelets: 250 x10E3/uL (ref 150–450)
RBC: 4.42 x10E6/uL (ref 3.77–5.28)
RDW: 12.8 % (ref 11.7–15.4)
WBC: 4.8 x10E3/uL (ref 3.4–10.8)

## 2024-08-09 LAB — HEPATIC FUNCTION PANEL
ALT: 35 IU/L — ABNORMAL HIGH (ref 0–32)
AST: 28 IU/L (ref 0–40)
Albumin: 4.4 g/dL (ref 3.9–4.9)
Alkaline Phosphatase: 91 IU/L (ref 49–135)
Bilirubin Total: 0.4 mg/dL (ref 0.0–1.2)
Bilirubin, Direct: 0.17 mg/dL (ref 0.00–0.40)
Total Protein: 6.6 g/dL (ref 6.0–8.5)

## 2024-08-16 ENCOUNTER — Telehealth: Payer: Self-pay | Admitting: Neurology

## 2024-08-16 DIAGNOSIS — G8929 Other chronic pain: Secondary | ICD-10-CM

## 2024-08-16 MED ORDER — CYCLOBENZAPRINE HCL 5 MG PO TABS: 5.0000 mg | ORAL_TABLET | Freq: Two times a day (BID) | ORAL | 4 refills | Status: AC | PRN

## 2024-08-16 NOTE — Telephone Encounter (Signed)
 On 08-16-24 at 9:49 am  Pt LVM to request medication refill cyclobenzaprine  (FLEXERIL ) 5 MG tablet    Pt Medication is to be sent to  [id number] - [place], [place] - [address] AT [place] OF [address] Phone: [phone]  Fax: [phone]

## 2024-08-16 NOTE — Telephone Encounter (Signed)
 REFILLED

## 2024-10-10 ENCOUNTER — Ambulatory Visit (INDEPENDENT_AMBULATORY_CARE_PROVIDER_SITE_OTHER): Payer: Self-pay

## 2024-10-10 VITALS — BP 100/66 | HR 87 | Temp 98.4°F | Ht 62.0 in | Wt 113.2 lb

## 2024-10-10 DIAGNOSIS — Z9011 Acquired absence of right breast and nipple: Secondary | ICD-10-CM | POA: Diagnosis not present

## 2024-10-10 DIAGNOSIS — Z853 Personal history of malignant neoplasm of breast: Secondary | ICD-10-CM

## 2024-10-10 DIAGNOSIS — Z79899 Other long term (current) drug therapy: Secondary | ICD-10-CM | POA: Diagnosis not present

## 2024-10-10 DIAGNOSIS — Z760 Encounter for issue of repeat prescription: Secondary | ICD-10-CM

## 2024-10-10 DIAGNOSIS — Z716 Tobacco abuse counseling: Secondary | ICD-10-CM

## 2024-10-10 DIAGNOSIS — F17210 Nicotine dependence, cigarettes, uncomplicated: Secondary | ICD-10-CM | POA: Diagnosis not present

## 2024-10-10 MED ORDER — VARENICLINE TARTRATE 1 MG PO TABS: 1.0000 mg | ORAL_TABLET | Freq: Two times a day (BID) | ORAL | 1 refills | Status: AC

## 2024-10-10 MED ORDER — FLUTICASONE PROPIONATE 50 MCG/ACT NA SUSP: 2.0000 | Freq: Every day | NASAL | 2 refills | Status: AC

## 2024-10-10 NOTE — Progress Notes (Unsigned)
 Patient name: London Nonaka Date of birth: [birth year] Date of visit: 10/11/2024  Type of visit: Established Patient Office Visit   Subjective   Chief concern:  Chief Complaint  Patient presents with   Medication Refill   Nicotine Dependence    Discuss smoking cessation currently smoking <0.5 PPD    Miyo Aina is a 63 y.o. female with a history of COPD, HTN, multiple sclerosis, MDD, breast cancer s/p right mastectomy and chemo, who presents to [facility] clinic to discuss smoking cessation and medication refill.  Patient presents to clinic today to discuss progression on smoking cessation.  She was prescribed the Chantix  starter pack at her last visit in 06/2024, and has been taking this, and is in the process of reducing the amount she currently smokes.  She does note that she has been smoking about 3 cigarettes/day, and is requesting a refill of Chantix  1 mg twice daily dosing, she is almost at the end of her starter pack.  She is feeling otherwise well, and is very motivated to quit smoking.  She also has a question regarding her nose.  She states that her kids have noticed that when she is outside in the cold her nose has a blue appearance.  Patient denies any changes of smell, difficulty breathing, or changes that she has noticed herself.  She has not touched her nose during these episodes, and is unsure if there is a change in sensation of her nose then.  I discussed with her to monitor for sensation changes, though sometimes with paler skin, we can see changes in blood distribution compared to the rest of her body.  And that she should let us  know if sensation changes, or she begins to have difficulty breathing.  ROS: Negative unless otherwise listed in the HPI.  Patient Active Problem List   Diagnosis Date Noted   Skin lesion of right arm 02/21/2024   Depression 08/06/2021   Chronic migraine without aura without status migrainosus, not intractable 03/19/2021   Other headache  syndrome 09/10/2020   High risk medication use 03/06/2020   Adhesive capsulitis of left shoulder 02/14/2020   Breast cancer, right (HCC) 04/30/2019   Port-A-Cath in place 02/13/2019   Malignant neoplasm of upper-outer quadrant of right breast in female, estrogen receptor positive (HCC) 11/27/2018   Chronic midline back pain 09/19/2018   left-sided thoracic back pain 07/20/2018   Solar lentigo 04/21/2018   Healthcare maintenance 04/20/2018   Easy bruising 08/31/2017   Chronic obstructive pulmonary disease (HCC) 11/30/2016   Essential hypertension 01/26/2016   Migraine headache 06/23/2014   Multiple sclerosis 06/23/2014   Hearing loss, bilateral 06/11/2014   Major depression, recurrent, chronic 06/11/2014   Cervical herniated disc 06/11/2014   Cigarette nicotine dependence without complication 06/11/2014   Lumbar herniated disc 06/11/2014     Past Surgical History:  Procedure Laterality Date   BREAST BIOPSY Right 11/21/2018   Malignant   HEMATOMA EVACUATION Right 05/01/2019   Procedure: EVACUATION HEMATOMA;  Surgeon: Ebbie Cough, MD;  Location: [facility] OR;  Service: General;  Laterality: Right;   MASTECTOMY Right 04/30/2019   MASTECTOMY W/ SENTINEL NODE BIOPSY Right 04/30/2019   Procedure: RIGHT MASTECTOMY WITH  RIGHT AXILLARY SENTINEL LYMPH NODE BIOPSY INJECT BLUE DYE;  Surgeon: Ebbie Cough, MD;  Location: [facility] OR;  Service: General;  Laterality: Right;   NASAL SEPTUM SURGERY     SIMPLE MASTECTOMY WITH AXILLARY SENTINEL NODE BIOPSY Right 04/30/2019   spinal injections  Current Outpatient Medications  Medication Instructions   anastrozole  (ARIMIDEX ) 1 mg, Oral, Daily   aspirin -acetaminophen -caffeine  (EXCEDRIN  MIGRAINE) 250-250-65 MG tablet 2 tablets, Every 6 hours PRN   carbidopa -levodopa  (SINEMET  IR) 10-100 MG tablet TAKE 1 TABLET BY MOUTH EVERYDAY AT BEDTIME   cyclobenzaprine  (FLEXERIL ) 5 mg, Oral, 2 times daily PRN   Dimethyl Fumarate  240 MG CPDR One po bid    fluticasone  (FLONASE ) 50 MCG/ACT nasal spray 2 sprays, Each Nare, Daily at bedtime   gabapentin  (NEURONTIN ) 600 mg, Oral, 2 times daily   lisinopril  (ZESTRIL ) 10 mg, Oral, Daily   Multiple Vitamin (MULTIVITAMIN WITH MINERALS) TABS tablet 2 tablets, Daily   omeprazole  (PRILOSEC  OTC) 20 mg, Daily PRN   oxybutynin  (DITROPAN  XL) 10 mg, Oral, Daily at bedtime   PARoxetine  (PAXIL ) 20 mg, Oral, Daily   Tiotropium Bromide  Monohydrate (SPIRIVA  RESPIMAT) 2.5 MCG/ACT AERS 2 puffs, Inhalation, Daily   traMADol  (ULTRAM ) 50 mg, Oral, 2 times daily PRN   varenicline  (CHANTIX ) 1 mg, Oral, 2 times daily    Social History[1]    Objective  Today's Vitals   10/10/24 1451  BP: 100/66  Pulse: 87  Temp: 98.4 F (36.9 C)  TempSrc: Oral  SpO2: 96%  Weight: 113 lb 3.2 oz (51.3 kg)  Height: 5' 2 (1.575 m)  Body mass index is 20.7 kg/m.   Physical Exam:   Constitutional: well-appearing female sitting in exam chair, in no acute distress. Ambulates without use of assistance device HEENT: normocephalic atraumatic, mucous membranes moist Eyes: conjunctiva non-erythematous Cardiovascular: regular rate and rhythm Pulmonary/Chest: normal work of breathing on room air, lungs clear to auscultation bilaterally MSK: normal bulk and tone. Neurological: alert & oriented x 3 Skin: warm and dry Psych: mood calm, behavior normal, thought content normal, judgement normal      The ASCVD Risk score (Arnett DK, et al., 2019) failed to calculate for the following reasons:   Cannot find a previous HDL lab   Cannot find a previous total cholesterol lab   * - Cholesterol units were assumed      Assessment & Plan  Problem List Items Addressed This Visit     Cigarette nicotine dependence without complication - Primary (Chronic)   Patient has been smoking since the age of 63, and has desire to quit smoking.  At her last visit in the [facility] on 06/2024, she was prescribed the Chantix  starter pack.  She presents today to  discuss smoking cessation progress, and notes that she is down to 3 cigarettes/day and feeling well.  She is almost out of her Chantix  starter pack, and is requesting Chantix  1 mg tablet twice daily refill.  I will send this to her pharmacy, and discussed with her that it is almost time for her annual LDCT Chest to rule out lung cancer, so I will go ahead and place this order for her.  She will follow-up in 3 months for other chronic conditions and to assess progress of smoking cessation. - Continue Chantix  1 mg BID - LDCT Chest for Lung Cancer Screening order placed - f/u in 3 months      Relevant Medications   varenicline  (CHANTIX ) 1 MG tablet   Other Relevant Orders   Low Dose CT Chest w/o Contrast for Lung Cancer Screening [[id number]]    Return in about 3 months (around 01/08/2025) for Tobacco cessation counseling.  Patient discussed with Dr. Shawn, who also saw and evaluated the patient.  Doyal Miyamoto, MD [facility]  PGY-1 10/11/2024, 10:53 AM      [  1]  Social History Tobacco Use   Smoking status: Every Day    Current packs/day: 1.00    Average packs/day: 1 pack/day for 44.0 years (44.0 ttl pk-yrs)    Types: Cigarettes   Smokeless tobacco: Never   Tobacco comments:    1 pk per day  Vaping Use   Vaping status: Former  Substance Use Topics   Alcohol use: Yes    Comment: 0-5 beers daily   Drug use: Yes    Types: Marijuana

## 2024-10-10 NOTE — Patient Instructions (Signed)
 Thank you, Ms.Lauraine Jama Bihari for allowing us  to provide your care today. Today we discussed the following:  - Keep up the great work with your smoking cessation! Let me know if you need the Chantix  sent to a local pharmacy   Tests ordered today:  CT Chest for Lung Cancer screening to be completed after 11/15/2024   I have ordered the following medication/changed the following medications:   Start the following medications: Meds ordered this encounter  Medications   varenicline  (CHANTIX ) 1 MG tablet    Sig: Take 1 tablet (1 mg total) by mouth 2 (two) times daily.    Dispense:  60 tablet    Refill:  1   fluticasone  (FLONASE ) 50 MCG/ACT nasal spray    Sig: Place 2 sprays into both nostrils at bedtime.    Dispense:  48 mL    Refill:  2     Follow up: 3-4 months    Remember: Please let us  know if your mail pharmacy won't fill the Chantix  so that we can send it to a local pharmacy for you  Should you have any questions or concerns please call the Internal Medicine Clinic at [phone].     Doyal Miyamoto, MD [facility]

## 2024-10-10 NOTE — Progress Notes (Unsigned)
 [facility] Attending  Case discussed with the resident at the time of the visit.  We reviewed the resident's history and exam and pertinent patient test results.  I agree with the assessment, diagnosis, and plan of care documented in the resident's note.

## 2024-10-11 ENCOUNTER — Telehealth: Payer: Self-pay | Admitting: Family Medicine

## 2024-10-11 NOTE — Telephone Encounter (Signed)
 Pt called stating that she is needing a refill on her traMADol  (ULTRAM ) 50 MG tablet and sent to the [facility] in [place].

## 2024-10-11 NOTE — Telephone Encounter (Signed)
 Last seen on 08/08/24  Follow up scheduled on 09/09/24  TRAMADOL  HCL 50 MG TABLET 09/09/2024 30 60 each Lomax, Amy, NP [id number] [place]   Rx pending to be signed

## 2024-10-11 NOTE — Assessment & Plan Note (Signed)
 Patient has been smoking since the age of 70, and has desire to quit smoking.  At her last visit in the [facility] on 06/2024, she was prescribed the Chantix  starter pack.  She presents today to discuss smoking cessation progress, and notes that she is down to 3 cigarettes/day and feeling well.  She is almost out of her Chantix  starter pack, and is requesting Chantix  1 mg tablet twice daily refill.  I will send this to her pharmacy, and discussed with her that it is almost time for her annual LDCT Chest to rule out lung cancer, so I will go ahead and place this order for her.  She will follow-up in 3 months for other chronic conditions and to assess progress of smoking cessation. - Continue Chantix  1 mg BID - LDCT Chest for Lung Cancer Screening order placed - f/u in 3 months

## 2024-10-12 NOTE — Addendum Note (Signed)
 Addended by: SHAWN SICK on: 10/12/2024 09:38 AM   Modules accepted: Level of Service

## 2024-10-29 DIAGNOSIS — I10 Essential (primary) hypertension: Secondary | ICD-10-CM

## 2024-10-29 NOTE — Telephone Encounter (Unsigned)
 Copied from CRM [id number]. Topic: Clinical - Medication Refill >> Oct 29, 2024 10:51 AM Debby BROCKS wrote: Medication: lisinopril  (ZESTRIL ) 10 MG tablet  Has the patient contacted their pharmacy? Yes (Agent: If no, request that the patient contact the pharmacy for the refill. If patient does not wish to contact the pharmacy document the reason why and proceed with request.) (Agent: If yes, when and what did the pharmacy advise?)  No refills remaining  This is the patient's preferred pharmacy:  [id number] [address], [place] - [address] [place] [address] [place] [address] Phone: [phone] Fax: [phone]  Is this the correct pharmacy for this prescription? Yes If no, delete pharmacy and type the correct one.   Has the prescription been filled recently? No  Is the patient out of the medication? Yes  Has the patient been seen for an appointment in the last year OR does the patient have an upcoming appointment? Yes  Can we respond through MyChart? Yes  Agent: Please be advised that Rx refills may take up to 3 business days. We ask that you follow-up with your pharmacy.

## 2024-10-30 MED ORDER — LISINOPRIL 10 MG PO TABS: 10.0000 mg | ORAL_TABLET | Freq: Every day | ORAL | 0 refills | Status: AC

## 2024-11-08 ENCOUNTER — Encounter: Payer: Self-pay | Admitting: Hematology and Oncology

## 2024-11-12 ENCOUNTER — Telehealth: Payer: Self-pay | Admitting: Neurology

## 2024-11-12 NOTE — Telephone Encounter (Signed)
 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 Pt son is not on DPR  did inform that  MD  may not be able to talk to  Pt son without consent from PT

## 2024-11-13 ENCOUNTER — Encounter: Payer: Self-pay | Admitting: Hematology and Oncology

## 2024-11-20 ENCOUNTER — Ambulatory Visit (HOSPITAL_BASED_OUTPATIENT_CLINIC_OR_DEPARTMENT_OTHER)

## 2024-11-21 ENCOUNTER — Telehealth: Payer: Self-pay | Admitting: *Deleted

## 2024-11-21 DIAGNOSIS — F339 Major depressive disorder, recurrent, unspecified: Secondary | ICD-10-CM

## 2024-11-21 MED ORDER — PAROXETINE HCL 20 MG PO TABS: 20.0000 mg | ORAL_TABLET | Freq: Every day | ORAL | 1 refills | Status: AC

## 2024-11-21 NOTE — Telephone Encounter (Signed)
 Will forward to PCP.                       Copied from CRM #[id number]. Topic: Clinical - Medication Refill >> Nov 21, 2024  9:30 AM Susanna ORN wrote: Medication: PARoxetine  (PAXIL ) 20 MG tablet  Has the patient contacted their pharmacy? No (Agent: If no, request that the patient contact the pharmacy for the refill. If patient does not wish to contact the pharmacy document the reason why and proceed with request.) (Agent: If yes, when and what did the pharmacy advise?)  This is the patient's preferred pharmacy:  [id number], [place] - [address] [place] [address] [place] [address] Phone: [phone] Fax: [phone]  Is this the correct pharmacy for this prescription? Yes If no, delete pharmacy and type the correct one.   Has the prescription been filled recently? No  Is the patient out of the medication? Yes  Has the patient been seen for an appointment in the last year OR does the patient have an upcoming appointment? Yes  Can we respond through MyChart? Yes  Agent: Please be advised that Rx refills may take up to 3 business days. We ask that you follow-up with your pharmacy.

## 2025-04-17 ENCOUNTER — Ambulatory Visit

## 2025-07-16 ENCOUNTER — Ambulatory Visit: Admitting: Hematology and Oncology

## 2025-09-09 ENCOUNTER — Ambulatory Visit: Admitting: Family Medicine
